# Patient Record
Sex: Female | Born: 1940
Health system: Southern US, Community
[De-identification: ages and names within clinical notes are randomized; demographics above are authoritative.]

## PROBLEM LIST (undated history)

## (undated) DIAGNOSIS — Z8719 Personal history of other diseases of the digestive system: Secondary | ICD-10-CM

## (undated) DIAGNOSIS — M1990 Unspecified osteoarthritis, unspecified site: Secondary | ICD-10-CM

## (undated) DIAGNOSIS — Z853 Personal history of malignant neoplasm of breast: Secondary | ICD-10-CM

## (undated) DIAGNOSIS — T8859XA Other complications of anesthesia, initial encounter: Secondary | ICD-10-CM

## (undated) DIAGNOSIS — J309 Allergic rhinitis, unspecified: Secondary | ICD-10-CM

## (undated) DIAGNOSIS — Z803 Family history of malignant neoplasm of breast: Secondary | ICD-10-CM

## (undated) DIAGNOSIS — IMO0002 Reserved for concepts with insufficient information to code with codable children: Secondary | ICD-10-CM

## (undated) DIAGNOSIS — G4730 Sleep apnea, unspecified: Secondary | ICD-10-CM

## (undated) DIAGNOSIS — T4145XA Adverse effect of unspecified anesthetic, initial encounter: Secondary | ICD-10-CM

## (undated) DIAGNOSIS — E039 Hypothyroidism, unspecified: Secondary | ICD-10-CM

## (undated) DIAGNOSIS — C50219 Malignant neoplasm of upper-inner quadrant of unspecified female breast: Secondary | ICD-10-CM

## (undated) DIAGNOSIS — IMO0001 Reserved for inherently not codable concepts without codable children: Secondary | ICD-10-CM

## (undated) DIAGNOSIS — Z973 Presence of spectacles and contact lenses: Secondary | ICD-10-CM

## (undated) HISTORY — DX: Unspecified osteoarthritis, unspecified site: M19.90

## (undated) HISTORY — DX: Family history of malignant neoplasm of breast: Z80.3

## (undated) HISTORY — PX: WISDOM TOOTH EXTRACTION: SHX21

## (undated) HISTORY — DX: Hypothyroidism, unspecified: E03.9

## (undated) HISTORY — DX: Sleep apnea, unspecified: G47.30

## (undated) HISTORY — PX: TONSILLECTOMY: SUR1361

## (undated) HISTORY — DX: Malignant neoplasm of upper-inner quadrant of unspecified female breast: C50.219

## (undated) HISTORY — DX: Personal history of malignant neoplasm of breast: Z85.3

## (undated) HISTORY — PX: EYE SURGERY: SHX253

## (undated) HISTORY — DX: Personal history of other diseases of the digestive system: Z87.19

## (undated) HISTORY — DX: Allergic rhinitis, unspecified: J30.9

---

## 1975-12-09 HISTORY — PX: ABDOMINAL HYSTERECTOMY: SHX81

## 1995-12-09 HISTORY — PX: MASTECTOMY: SHX3

## 2000-10-20 ENCOUNTER — Encounter (INDEPENDENT_AMBULATORY_CARE_PROVIDER_SITE_OTHER): Payer: Self-pay | Admitting: Specialist

## 2000-10-20 ENCOUNTER — Ambulatory Visit (HOSPITAL_BASED_OUTPATIENT_CLINIC_OR_DEPARTMENT_OTHER): Admission: RE | Admit: 2000-10-20 | Discharge: 2000-10-20 | Payer: Self-pay | Admitting: General Surgery

## 2002-11-16 ENCOUNTER — Other Ambulatory Visit: Admission: RE | Admit: 2002-11-16 | Discharge: 2002-11-16 | Payer: Self-pay | Admitting: Internal Medicine

## 2004-01-31 ENCOUNTER — Ambulatory Visit (HOSPITAL_COMMUNITY): Admission: RE | Admit: 2004-01-31 | Discharge: 2004-01-31 | Payer: Self-pay | Admitting: Hematology & Oncology

## 2004-11-13 ENCOUNTER — Ambulatory Visit: Payer: Self-pay | Admitting: Internal Medicine

## 2004-11-25 ENCOUNTER — Ambulatory Visit: Payer: Self-pay | Admitting: Internal Medicine

## 2004-11-26 ENCOUNTER — Ambulatory Visit (HOSPITAL_BASED_OUTPATIENT_CLINIC_OR_DEPARTMENT_OTHER): Admission: RE | Admit: 2004-11-26 | Discharge: 2004-11-26 | Payer: Self-pay | Admitting: Internal Medicine

## 2004-11-26 ENCOUNTER — Ambulatory Visit: Payer: Self-pay | Admitting: Pulmonary Disease

## 2004-12-08 HISTORY — PX: COLON SURGERY: SHX602

## 2004-12-23 ENCOUNTER — Ambulatory Visit: Payer: Self-pay | Admitting: Internal Medicine

## 2005-01-02 ENCOUNTER — Encounter: Admission: RE | Admit: 2005-01-02 | Discharge: 2005-01-02 | Payer: Self-pay | Admitting: Internal Medicine

## 2005-01-02 ENCOUNTER — Ambulatory Visit: Payer: Self-pay | Admitting: Internal Medicine

## 2005-01-21 ENCOUNTER — Ambulatory Visit: Payer: Self-pay | Admitting: Hematology & Oncology

## 2005-11-18 ENCOUNTER — Ambulatory Visit: Payer: Self-pay | Admitting: Internal Medicine

## 2005-11-25 ENCOUNTER — Ambulatory Visit: Payer: Self-pay | Admitting: Internal Medicine

## 2005-12-08 HISTORY — PX: OTHER SURGICAL HISTORY: SHX169

## 2005-12-08 HISTORY — PX: COLONOSCOPY: SHX174

## 2005-12-08 HISTORY — PX: HERNIA REPAIR: SHX51

## 2006-01-20 ENCOUNTER — Ambulatory Visit: Payer: Self-pay | Admitting: Hematology & Oncology

## 2006-01-20 ENCOUNTER — Ambulatory Visit: Payer: Self-pay | Admitting: Internal Medicine

## 2006-01-21 ENCOUNTER — Encounter: Admission: RE | Admit: 2006-01-21 | Discharge: 2006-01-21 | Payer: Self-pay | Admitting: Internal Medicine

## 2006-03-10 ENCOUNTER — Inpatient Hospital Stay (HOSPITAL_COMMUNITY): Admission: RE | Admit: 2006-03-10 | Discharge: 2006-03-16 | Payer: Self-pay | Admitting: General Surgery

## 2006-03-10 ENCOUNTER — Encounter (INDEPENDENT_AMBULATORY_CARE_PROVIDER_SITE_OTHER): Payer: Self-pay | Admitting: *Deleted

## 2006-11-25 ENCOUNTER — Ambulatory Visit (HOSPITAL_COMMUNITY): Admission: RE | Admit: 2006-11-25 | Discharge: 2006-11-26 | Payer: Self-pay | Admitting: Surgery

## 2007-01-27 ENCOUNTER — Ambulatory Visit: Payer: Self-pay | Admitting: Hematology & Oncology

## 2007-02-24 LAB — CBC WITH DIFFERENTIAL/PLATELET
BASO%: 0.4 % (ref 0.0–2.0)
EOS%: 2.5 % (ref 0.0–7.0)
Eosinophils Absolute: 0.2 10*3/uL (ref 0.0–0.5)
HCT: 39 % (ref 34.8–46.6)
HGB: 13.4 g/dL (ref 11.6–15.9)
LYMPH%: 21.6 % (ref 14.0–48.0)
MCV: 90 fL (ref 81.0–101.0)
MONO#: 0.5 10*3/uL (ref 0.1–0.9)
MONO%: 6.2 % (ref 0.0–13.0)
NEUT#: 5.1 10*3/uL (ref 1.5–6.5)
Platelets: 285 10*3/uL (ref 145–400)
RBC: 4.33 10*6/uL (ref 3.70–5.32)

## 2007-02-24 LAB — COMPREHENSIVE METABOLIC PANEL
ALT: 17 U/L (ref 0–35)
Albumin: 3.9 g/dL (ref 3.5–5.2)
BUN: 14 mg/dL (ref 6–23)
Calcium: 9.4 mg/dL (ref 8.4–10.5)
Sodium: 141 mEq/L (ref 135–145)
Total Protein: 7.1 g/dL (ref 6.0–8.3)

## 2007-02-24 LAB — CA 125: CA 125: 7.5 U/mL (ref 0.0–30.2)

## 2007-09-02 ENCOUNTER — Encounter: Payer: Self-pay | Admitting: Internal Medicine

## 2007-09-09 DIAGNOSIS — M1990 Unspecified osteoarthritis, unspecified site: Secondary | ICD-10-CM

## 2007-09-09 HISTORY — DX: Unspecified osteoarthritis, unspecified site: M19.90

## 2007-10-11 ENCOUNTER — Encounter: Payer: Self-pay | Admitting: Internal Medicine

## 2007-10-12 ENCOUNTER — Ambulatory Visit: Payer: Self-pay | Admitting: Internal Medicine

## 2007-10-12 DIAGNOSIS — J309 Allergic rhinitis, unspecified: Secondary | ICD-10-CM

## 2007-10-12 DIAGNOSIS — E039 Hypothyroidism, unspecified: Secondary | ICD-10-CM | POA: Insufficient documentation

## 2007-10-12 DIAGNOSIS — Z8719 Personal history of other diseases of the digestive system: Secondary | ICD-10-CM | POA: Insufficient documentation

## 2007-10-12 DIAGNOSIS — Z853 Personal history of malignant neoplasm of breast: Secondary | ICD-10-CM | POA: Insufficient documentation

## 2007-10-12 HISTORY — DX: Allergic rhinitis, unspecified: J30.9

## 2007-10-12 HISTORY — DX: Hypothyroidism, unspecified: E03.9

## 2007-10-12 HISTORY — DX: Personal history of other diseases of the digestive system: Z87.19

## 2007-10-12 HISTORY — DX: Personal history of malignant neoplasm of breast: Z85.3

## 2007-10-12 LAB — CONVERTED CEMR LAB
Cholesterol: 174 mg/dL (ref 0–200)
LDL Cholesterol: 112 mg/dL — ABNORMAL HIGH (ref 0–99)
Total CHOL/HDL Ratio: 3.7

## 2008-02-23 ENCOUNTER — Ambulatory Visit: Payer: Self-pay | Admitting: Hematology & Oncology

## 2008-04-03 ENCOUNTER — Encounter: Payer: Self-pay | Admitting: Internal Medicine

## 2008-04-03 LAB — CBC WITH DIFFERENTIAL/PLATELET
EOS%: 4.5 % (ref 0.0–7.0)
LYMPH%: 22.5 % (ref 14.0–48.0)
MCH: 31.1 pg (ref 26.0–34.0)
MCHC: 34.4 g/dL (ref 32.0–36.0)
MCV: 90.4 fL (ref 81.0–101.0)
NEUT%: 66.3 % (ref 39.6–76.8)
Platelets: 306 10*3/uL (ref 145–400)
RDW: 12.8 % (ref 11.3–14.5)
lymph#: 1.4 10*3/uL (ref 0.9–3.3)

## 2008-04-03 LAB — COMPREHENSIVE METABOLIC PANEL
ALT: 15 U/L (ref 0–35)
Albumin: 4 g/dL (ref 3.5–5.2)
BUN: 15 mg/dL (ref 6–23)
Chloride: 106 mEq/L (ref 96–112)
Creatinine, Ser: 0.62 mg/dL (ref 0.40–1.20)
Sodium: 140 mEq/L (ref 135–145)

## 2008-09-04 ENCOUNTER — Encounter: Payer: Self-pay | Admitting: Internal Medicine

## 2008-10-16 ENCOUNTER — Encounter: Payer: Self-pay | Admitting: Internal Medicine

## 2008-10-17 ENCOUNTER — Ambulatory Visit: Payer: Self-pay | Admitting: Internal Medicine

## 2008-10-17 LAB — CONVERTED CEMR LAB
ALT: 20 units/L (ref 0–35)
AST: 21 units/L (ref 0–37)
Alkaline Phosphatase: 60 units/L (ref 39–117)
Basophils Relative: 1 % (ref 0.0–3.0)
Bilirubin, Direct: 0.2 mg/dL (ref 0.0–0.3)
CO2: 27 meq/L (ref 19–32)
Calcium: 9.3 mg/dL (ref 8.4–10.5)
GFR calc Af Amer: 157 mL/min
HCT: 39.3 % (ref 36.0–46.0)
LDL Cholesterol: 108 mg/dL — ABNORMAL HIGH (ref 0–99)
Lymphocytes Relative: 27.2 % (ref 12.0–46.0)
MCHC: 34 g/dL (ref 30.0–36.0)
MCV: 93.9 fL (ref 78.0–100.0)
Sodium: 141 meq/L (ref 135–145)
Total CHOL/HDL Ratio: 4
Total Protein: 7.2 g/dL (ref 6.0–8.3)
Triglycerides: 124 mg/dL (ref 0–149)

## 2008-10-30 ENCOUNTER — Telehealth: Payer: Self-pay | Admitting: Internal Medicine

## 2009-09-13 ENCOUNTER — Telehealth: Payer: Self-pay | Admitting: Internal Medicine

## 2009-09-13 ENCOUNTER — Encounter: Payer: Self-pay | Admitting: Internal Medicine

## 2009-12-12 ENCOUNTER — Ambulatory Visit: Payer: Self-pay | Admitting: Internal Medicine

## 2009-12-17 ENCOUNTER — Encounter (INDEPENDENT_AMBULATORY_CARE_PROVIDER_SITE_OTHER): Payer: Self-pay | Admitting: *Deleted

## 2010-09-16 ENCOUNTER — Encounter: Payer: Self-pay | Admitting: Internal Medicine

## 2010-12-17 ENCOUNTER — Ambulatory Visit
Admission: RE | Admit: 2010-12-17 | Discharge: 2010-12-17 | Payer: Self-pay | Source: Home / Self Care | Attending: Internal Medicine | Admitting: Internal Medicine

## 2010-12-17 ENCOUNTER — Other Ambulatory Visit: Payer: Self-pay | Admitting: Internal Medicine

## 2010-12-17 ENCOUNTER — Encounter: Payer: Self-pay | Admitting: Internal Medicine

## 2010-12-17 LAB — CBC WITH DIFFERENTIAL/PLATELET
Basophils Absolute: 0 10*3/uL (ref 0.0–0.1)
Basophils Relative: 0.7 % (ref 0.0–3.0)
Eosinophils Absolute: 0.3 10*3/uL (ref 0.0–0.7)
Eosinophils Relative: 4.3 % (ref 0.0–5.0)
HCT: 41.5 % (ref 36.0–46.0)
Hemoglobin: 14.3 g/dL (ref 12.0–15.0)
Lymphocytes Relative: 25.5 % (ref 12.0–46.0)
Lymphs Abs: 1.8 10*3/uL (ref 0.7–4.0)
MCHC: 34.4 g/dL (ref 30.0–36.0)
MCV: 94.4 fl (ref 78.0–100.0)
Monocytes Absolute: 0.6 10*3/uL (ref 0.1–1.0)
Monocytes Relative: 9 % (ref 3.0–12.0)
Neutro Abs: 4.2 10*3/uL (ref 1.4–7.7)
Neutrophils Relative %: 60.5 % (ref 43.0–77.0)
Platelets: 247 10*3/uL (ref 150.0–400.0)
RBC: 4.4 Mil/uL (ref 3.87–5.11)
RDW: 13.3 % (ref 11.5–14.6)
WBC: 7 10*3/uL (ref 4.5–10.5)

## 2010-12-17 LAB — HEPATIC FUNCTION PANEL
ALT: 20 U/L (ref 0–35)
AST: 22 U/L (ref 0–37)
Albumin: 3.9 g/dL (ref 3.5–5.2)
Alkaline Phosphatase: 66 U/L (ref 39–117)
Bilirubin, Direct: 0.1 mg/dL (ref 0.0–0.3)
Total Bilirubin: 0.9 mg/dL (ref 0.3–1.2)
Total Protein: 7.1 g/dL (ref 6.0–8.3)

## 2010-12-17 LAB — LIPID PANEL
Cholesterol: 198 mg/dL (ref 0–200)
HDL: 58.3 mg/dL (ref >39.00)
LDL Cholesterol: 118 mg/dL — ABNORMAL HIGH (ref 0–99)
Total CHOL/HDL Ratio: 3
Triglycerides: 111 mg/dL (ref 0.0–149.0)
VLDL: 22.2 mg/dL (ref 0.0–40.0)

## 2010-12-17 LAB — BASIC METABOLIC PANEL
BUN: 15 mg/dL (ref 6–23)
CO2: 29 mEq/L (ref 19–32)
Calcium: 9.4 mg/dL (ref 8.4–10.5)
Chloride: 103 mEq/L (ref 96–112)
Creatinine, Ser: 0.6 mg/dL (ref 0.4–1.2)
GFR: 100.6 mL/min (ref >60.00)
Glucose, Bld: 83 mg/dL (ref 70–99)
Potassium: 4.4 mEq/L (ref 3.5–5.1)
Sodium: 141 mEq/L (ref 135–145)

## 2010-12-17 LAB — TSH: TSH: 1.01 u[IU]/mL (ref 0.35–5.50)

## 2010-12-18 ENCOUNTER — Telehealth: Payer: Self-pay | Admitting: Internal Medicine

## 2011-01-05 LAB — CONVERTED CEMR LAB
ALT: 22 units/L (ref 0–35)
AST: 22 units/L (ref 0–37)
Albumin: 3.7 g/dL (ref 3.5–5.2)
Basophils Absolute: 0 10*3/uL (ref 0.0–0.1)
Bilirubin, Direct: 0 mg/dL (ref 0.0–0.3)
CO2: 29 meq/L (ref 19–32)
Calcium: 9.4 mg/dL (ref 8.4–10.5)
Chloride: 107 meq/L (ref 96–112)
Cholesterol: 176 mg/dL (ref 0–200)
Eosinophils Absolute: 0.2 10*3/uL (ref 0.0–0.7)
Hemoglobin: 13.8 g/dL (ref 12.0–15.0)
LDL Cholesterol: 102 mg/dL — ABNORMAL HIGH (ref 0–99)
Lymphs Abs: 1.4 10*3/uL (ref 0.7–4.0)
MCHC: 32.6 g/dL (ref 30.0–36.0)
MCV: 96.8 fL (ref 78.0–100.0)
Platelets: 223 10*3/uL (ref 150.0–400.0)
Potassium: 4.4 meq/L (ref 3.5–5.1)
RDW: 12.8 % (ref 11.5–14.6)
Total Bilirubin: 0.8 mg/dL (ref 0.3–1.2)
Total CHOL/HDL Ratio: 3

## 2011-01-09 NOTE — Assessment & Plan Note (Signed)
Summary: CPX (PT WILL COME IN FASTING FOR APPT) // RS   Vital Signs:  Patient profile:   70 year old female Weight:      180 pounds BMI:     34.13 O2 Sat:      97 % on Room air Temp:     98.1 degrees F oral Pulse rate:   82 / minute BP sitting:   120 / 82  (right arm) Cuff size:   regular  Vitals Entered By: Duard Brady LPN (December 17, 2010 8:41 AM)  O2 Flow:  Room air CC: cpx- doing well Is Patient Diabetic? No   CC:  cpx- doing well.  History of Present Illness: 63 -year-old patient who is seen today for a comprehensive evaluation.  she has a remote history of left breast cancer, osteoarthritis, and also a history of complex diverticular disease.  For the past several weeks.  She has had left groin and left lower back pain.  This has been quite uncomfortable, but often interferes with sleep.  Pain is aggravated by bending and stooping.  She has cervical pain with the upright ambulation.  Here for Medicare AWV:  1.   Risk factors based on Past M, S, F history: financial risk factors none 2.   Physical Activities: remains fairly active without exercise limitations 3.   Depression/mood: history depression, or mood disorder 4.   Hearing: no hearing deficits 5.   ADL's: independent in all aspects of daily living 6.   Fall Risk: low 7.   Home Safety: no problems identified 8.   Height, weight, &visual acuity:height and weight stable;  planning on right Extraction surgery next month 9.   Counseling: heart healthy diet modest weight loss, and more regular exercise regimen.  All encouraged 10.   Labs ordered based on risk factors: laboratory  Profile will be reviewed 11.           Referral Coordination- none appropriate at this time.  Will need follow-up colonoscopy January 2016 12.           Care Plan- calcium and vitamin D supplementation.  Encouraged 13.            Cognitive Assessment- alert and oriented, with normal affect.  No cognitive dysfunction   Allergies  (verified): No Known Drug Allergies  Past History:  Past Medical History: Reviewed history from 10/17/2008 and no changes required. Osteoarthritis Breast cancer, hx of stage IIb 1997 Diverticulitis, hx of Allergic rhinitis obstructive sleep apnea  Past Surgical History: Reviewed history from 10/17/2008 and no changes required. Colonoscopy January 2006 Mastectomy 1997 Hysterectomy 1975 correction of a colovaginal fistula. April  2007 repair of a ventral hernia, December 2007  Family History: Reviewed history from 10/17/2008 and no changes required. Family History of DJD Family History of DVT Family History of CAD Female 1st degree relative <50 Family History Lung cancer Family History of Cardiovascular disorder Family History of Respiratory disease  father died age 60  lung cancer, history of COPD, coronary artery disease, status post CABG mother died age 47, history of COPD, peripheral neuropathy, deep vein thrombosis  No siblings  Social History: Reviewed history from 09/09/2007 and no changes required. Retired Married Never Smoked  Review of Systems       The patient complains of anorexia.  The patient denies fever, weight loss, weight gain, vision loss, decreased hearing, hoarseness, chest pain, syncope, dyspnea on exertion, peripheral edema, prolonged cough, headaches, hemoptysis, abdominal pain, melena, hematochezia, severe indigestion/heartburn, hematuria, incontinence,  genital sores, muscle weakness, suspicious skin lesions, transient blindness, difficulty walking, depression, unusual weight change, abnormal bleeding, enlarged lymph nodes, angioedema, and breast masses.    Physical Exam  General:  overweight-appearing.  blood pressure, normaloverweight-appearing.   Head:  Normocephalic and atraumatic without obvious abnormalities. No apparent alopecia or balding. Eyes:  No corneal or conjunctival inflammation noted. EOMI. Perrla. Funduscopic exam benign, without  hemorrhages, exudates or papilledema. Vision grossly normal. Ears:  External ear exam shows no significant lesions or deformities.  Otoscopic examination reveals clear canals, tympanic membranes are intact bilaterally without bulging, retraction, inflammation or discharge. Hearing is grossly normal bilaterally. Nose:  External nasal examination shows no deformity or inflammation. Nasal mucosa are pink and moist without lesions or exudates. Mouth:  Oral mucosa and oropharynx without lesions or exudates.  Teeth in good repair. Neck:  No deformities, masses, or tenderness noted. Chest Wall:  No deformities, masses, or tenderness noted. Breasts:  left radical mastectomy Lungs:  Normal respiratory effort, chest expands symmetrically. Lungs are clear to auscultation, no crackles or wheezes. Heart:  Normal rate and regular rhythm. S1 and S2 normal without gallop, murmur, click, rub or other extra sounds. Abdomen:  lower midline scar.  No organomegaly, moderately overweight.  No tenderness Rectal:  No external abnormalities noted. Normal sphincter tone. No rectal masses or tenderness. Genitalia:  status post hysterectomy Msk:  No deformity or scoliosis noted of thoracic or lumbar spine.   negative straight leg test range of motion of the left hip intact Pulses:  R and L carotid,radial,femoral,dorsalis pedis and posterior tibial pulses are full and equal bilaterally Extremities:  No clubbing, cyanosis, edema, or deformity noted with normal full range of motion of all joints.   Neurologic:  No cranial nerve deficits noted. Station and gait are normal. Plantar reflexes are down-going bilaterally. DTRs are symmetrical throughout. Sensory, motor and coordinative functions appear intact. Skin:  Intact without suspicious lesions or rashes Cervical Nodes:  No lymphadenopathy noted Axillary Nodes:  No palpable lymphadenopathy Inguinal Nodes:  No significant adenopathy Psych:  Cognition and judgment appear  intact. Alert and cooperative with normal attention span and concentration. No apparent delusions, illusions, hallucinations   Impression & Recommendations:  Problem # 1:  PREVENTIVE HEALTH CARE (ICD-V70.0)  Orders: EKG w/ Interpretation (93000)  Problem # 2:  HYPOTHYROIDISM, PRIMARY (ICD-244.9)  Her updated medication list for this problem includes:    Synthroid 125 Mcg Tabs (Levothyroxine sodium) .Marland Kitchen... 1 once daily    Her updated medication list for this problem includes:    Synthroid 125 Mcg Tabs (Levothyroxine sodium) .Marland Kitchen... 1 once daily  Problem # 3:  DIVERTICULITIS, HX OF (ICD-V12.79)  Problem # 4:  BREAST CANCER, HX OF (ICD-V10.3)  Complete Medication List: 1)  Adult Aspirin Low Strength 81 Mg Tbdp (Aspirin) .Marland Kitchen.. 1 once daily 2)  Synthroid 125 Mcg Tabs (Levothyroxine sodium) .Marland Kitchen.. 1 once daily 3)  Flonase 50 Mcg/act Susp (Fluticasone propionate) .... Use daily prn  Other Orders: Medicare -1st Annual Wellness Visit [phone]) Venipuncture [phone]) TLB-Lipid Panel (80061-LIPID) TLB-BMP (Basic Metabolic Panel-BMET) (80048-METABOL) TLB-CBC Platelet - w/Differential (85025-CBCD) TLB-Hepatic/Liver Function Pnl (80076-HEPATIC) TLB-TSH (Thyroid Stimulating Hormone) (84443-TSH)  Patient Instructions: 1)  Limit your Sodium (Salt). 2)  It is important that you exercise regularly at least 20 minutes 5 times a week. If you develop chest pain, have severe difficulty breathing, or feel very tired , stop exercising immediately and seek medical attention. 3)  You need to lose weight. Consider a lower calorie diet and regular  exercise.  4)  Take calcium +Vitamin D daily. Prescriptions: FLONASE 50 MCG/ACT  SUSP (FLUTICASONE PROPIONATE) use daily prn  #3 x 6   Entered and Authorized by:   Gordy Savers  MD   Signed by:   Gordy Savers  MD on 12/17/2010   Method used:   Print then Give to Patient   RxID:   [id number] SYNTHROID 125 MCG  TABS (LEVOTHYROXINE SODIUM) 1 once  daily  #90 x 6   Entered and Authorized by:   Gordy Savers  MD   Signed by:   Gordy Savers  MD on 12/17/2010   Method used:   Print then Give to Patient   RxID:   [id number]    Orders Added: 1)  EKG w/ Interpretation [93000] 2)  Medicare -1st Annual Wellness Visit [G0438] 3)  Est. Patient Level III [28413] 4)  Venipuncture [36415] 5)  TLB-Lipid Panel [80061-LIPID] 6)  TLB-BMP (Basic Metabolic Panel-BMET) [80048-METABOL] 7)  TLB-CBC Platelet - w/Differential [85025-CBCD] 8)  TLB-Hepatic/Liver Function Pnl [80076-HEPATIC] 9)  TLB-TSH (Thyroid Stimulating Hormone) [24401-UUV]

## 2011-01-09 NOTE — Assessment & Plan Note (Signed)
Summary: emp-will fast//ccm   Vital Signs:  Patient profile:   70 year old female Height:      61 inches Weight:      178 pounds BMI:     33.75 BP sitting:   128 / 72  (right arm) Cuff size:   regular  Vitals Entered By: Raechel Ache, RN (December 12, 2009 9:38 AM) CC: OV, fasting.  Is Patient Diabetic? No   CC:  OV and fasting. Marland Kitchen  History of Present Illness: 28 -year-old patient who is seen today for a comprehensive evaluation.  medical problems, including history of complex diverticular disease.  She is allergic rhinitis.  Remote history of breast cancer and also history of osteoarthritis.  She has treated hypothyroidism.  She is doing quite well.  Today, and is now status post mastectomy 10 years ago.  Allergies: No Known Drug Allergies  Past History:  Past Medical History: Reviewed history from 10/17/2008 and no changes required. Osteoarthritis Breast cancer, hx of stage IIb 1997 Diverticulitis, hx of Allergic rhinitis obstructive sleep apnea  Past Surgical History: Reviewed history from 10/17/2008 and no changes required. Colonoscopy January 2006 Mastectomy 1997 Hysterectomy 1975 correction of a colovaginal fistula. April  2007 repair of a ventral hernia, December 2007  Family History: Reviewed history from 10/17/2008 and no changes required. Family History of DJD Family History of DVT Family History of CAD Female 1st degree relative <50 Family History Lung cancer Family History of Cardiovascular disorder Family History of Respiratory disease  father died age 33 lung cancer, history of COPD, coronary artery disease, status post CABG mother died age 70, history of COPD, peripheral neuropathy, deep vein thrombosis  No siblings  Social History: Reviewed history from 09/09/2007 and no changes required. Retired Married Never Smoked  Review of Systems  The patient denies anorexia, fever, weight loss, weight gain, vision loss, decreased hearing,  hoarseness, chest pain, syncope, dyspnea on exertion, peripheral edema, prolonged cough, headaches, hemoptysis, abdominal pain, melena, hematochezia, severe indigestion/heartburn, hematuria, incontinence, genital sores, muscle weakness, suspicious skin lesions, transient blindness, difficulty walking, depression, unusual weight change, abnormal bleeding, enlarged lymph nodes, angioedema, and breast masses.    Physical Exam  General:  overweight-appearing.  normal blood pressureoverweight-appearing.   Head:  Normocephalic and atraumatic without obvious abnormalities. No apparent alopecia or balding. Eyes:  No corneal or conjunctival inflammation noted. EOMI. Perrla. Funduscopic exam benign, without hemorrhages, exudates or papilledema. Vision grossly normal. Ears:  External ear exam shows no significant lesions or deformities.  Otoscopic examination reveals clear canals, tympanic membranes are intact bilaterally without bulging, retraction, inflammation or discharge. Hearing is grossly normal bilaterally. Nose:  External nasal examination shows no deformity or inflammation. Nasal mucosa are pink and moist without lesions or exudates. Mouth:  Oral mucosa and oropharynx without lesions or exudates.  Teeth in good repair. Neck:  No deformities, masses, or tenderness noted. Chest Wall:  No deformities, masses, or tenderness noted. Breasts:  left mastectomy Lungs:  Normal respiratory effort, chest expands symmetrically. Lungs are clear to auscultation, no crackles or wheezes. Heart:  Normal rate and regular rhythm. S1 and S2 normal without gallop, murmur, click, rub or other extra sounds. Abdomen:  Bowel sounds positive,abdomen soft and non-tender without masses, organomegaly or hernias noted. Rectal:  No external abnormalities noted. Normal sphincter tone. No rectal masses or tenderness. Genitalia:  status post hysterectomy Msk:  No deformity or scoliosis noted of thoracic or lumbar spine.   Pulses:  R  and L carotid,radial,femoral,dorsalis pedis and  posterior tibial pulses are full and equal bilaterally Extremities:  No clubbing, cyanosis, edema, or deformity noted with normal full range of motion of all joints.   Neurologic:  alert & oriented X3, cranial nerves II-XII intact, strength normal in all extremities, gait normal, and DTRs symmetrical and normal.  alert & oriented X3, cranial nerves II-XII intact, strength normal in all extremities, gait normal, and DTRs symmetrical and normal.   Skin:  Intact without suspicious lesions or rashes Cervical Nodes:  No lymphadenopathy noted Axillary Nodes:  No palpable lymphadenopathy Inguinal Nodes:  No significant adenopathy Psych:  Cognition and judgment appear intact. Alert and cooperative with normal attention span and concentration. No apparent delusions, illusions, hallucinations   Impression & Recommendations:  Problem # 1:  HYPOTHYROIDISM, PRIMARY (ICD-244.9)  Her updated medication list for this problem includes:    Synthroid 125 Mcg Tabs (Levothyroxine sodium) .Marland Kitchen... 1 once daily  Her updated medication list for this problem includes:    Synthroid 125 Mcg Tabs (Levothyroxine sodium) .Marland Kitchen... 1 once daily  Orders: Venipuncture (28413) TLB-Lipid Panel (80061-LIPID) TLB-BMP (Basic Metabolic Panel-BMET) (80048-METABOL) TLB-CBC Platelet - w/Differential (85025-CBCD) TLB-Hepatic/Liver Function Pnl (80076-HEPATIC) TLB-TSH (Thyroid Stimulating Hormone) (84443-TSH)  Problem # 2:  ALLERGIC RHINITIS (ICD-477.9)  Her updated medication list for this problem includes:    Flonase 50 Mcg/act Susp (Fluticasone propionate) ..... Use daily prn  Her updated medication list for this problem includes:    Flonase 50 Mcg/act Susp (Fluticasone propionate) ..... Use daily prn  Orders: TLB-BMP (Basic Metabolic Panel-BMET) (80048-METABOL) TLB-CBC Platelet - w/Differential (85025-CBCD) TLB-Hepatic/Liver Function Pnl (80076-HEPATIC)  Problem # 3:   DIVERTICULITIS, HX OF (ICD-V12.79)  Orders: TLB-BMP (Basic Metabolic Panel-BMET) (80048-METABOL) TLB-CBC Platelet - w/Differential (85025-CBCD) TLB-Hepatic/Liver Function Pnl (80076-HEPATIC)  Problem # 4:  BREAST CANCER, HX OF (ICD-V10.3)  Orders: TLB-BMP (Basic Metabolic Panel-BMET) (80048-METABOL) TLB-CBC Platelet - w/Differential (85025-CBCD) TLB-Hepatic/Liver Function Pnl (80076-HEPATIC)  Complete Medication List: 1)  Adult Aspirin Low Strength 81 Mg Tbdp (Aspirin) .Marland Kitchen.. 1 once daily 2)  Synthroid 125 Mcg Tabs (Levothyroxine sodium) .Marland Kitchen.. 1 once daily 3)  Flonase 50 Mcg/act Susp (Fluticasone propionate) .... Use daily prn  Other Orders: EKG w/ Interpretation (93000) Pelvic & Breast Exam ( Medicare)  (K4401)  Patient Instructions: 1)  Please schedule a follow-up appointment in 1 year. 2)  It is important that you exercise regularly at least 20 minutes 5 times a week. If you develop chest pain, have severe difficulty breathing, or feel very tired , stop exercising immediately and seek medical attention. 3)  Schedule your mammogram. 4)  Take calcium +Vitamin D daily. Prescriptions: FLONASE 50 MCG/ACT  SUSP (FLUTICASONE PROPIONATE) use daily prn  #3 x 6   Entered and Authorized by:   Gordy Savers  MD   Signed by:   Gordy Savers  MD on 12/12/2009   Method used:   Print then Give to Patient   RxID:   [id number] SYNTHROID 125 MCG  TABS (LEVOTHYROXINE SODIUM) 1 once daily  #90 x 6   Entered and Authorized by:   Gordy Savers  MD   Signed by:   Gordy Savers  MD on 12/12/2009   Method used:   Print then Give to Patient   RxID:   [id number]

## 2011-01-09 NOTE — Letter (Signed)
Summary: Colonoscopy Date Change Letter  [facility]  [address] [place], [place] [address]   Phone: [phone]  Fax: [phone]      December 17, 2009 MRN: [id number]   [facility] [address] [place], [place]  [address]   Dear Ms. Faustino Congress,   Previously you were recommended to have a repeat colonoscopy around this time. Your chart was recently reviewed by Dr. Marina Goodell of [facility]. Follow up colonoscopy is now recommended in JAN 2016. This revised recommendation is based on current, nationally recognized guidelines for colorectal cancer screening and polyp surveillance. These guidelines are endorsed by the American Cancer Society, [employer] on Colorectal Cancer as well as numerous other major medical organizations.  Please understand that our recommendation assumes that you do not have any new symptoms such as bleeding, a change in bowel habits, anemia, or significant abdominal discomfort. If you do have any concerning GI symptoms or want to discuss the guideline recommendations, please call to arrange an office visit at your earliest convenience. Otherwise we will keep you in our reminder system and contact you 1-2 months prior to the date listed above to schedule your next colonoscopy.  Thank you,  Wilhemina Bonito. Marina Goodell, M.D.  [facility] [phone]

## 2011-01-09 NOTE — Progress Notes (Signed)
Summary: Question about Synthroid RX  Phone Note Call from Patient Call back at Home Phone [phone]   Caller: Patient Summary of Call: Pt waiting on a response from Dr. Kirtland Bouchard in regards to if she needed another rx for Synthroid. Initial call taken by: Trixie Dredge,  December 18, 2010 11:39 AM  Follow-up for Phone Call        continue present dose of synthroid (TSH normal) Follow-up by: Gordy Savers  MD,  December 18, 2010 1:02 PM  Additional Follow-up for Phone Call Additional follow up Details #1::        pt aware lab ok - continue present dose of med. KIK Additional Follow-up by: Duard Brady LPN,  December 18, 2010 1:09 PM

## 2011-01-28 ENCOUNTER — Encounter: Payer: Self-pay | Admitting: Internal Medicine

## 2011-01-28 ENCOUNTER — Ambulatory Visit (INDEPENDENT_AMBULATORY_CARE_PROVIDER_SITE_OTHER): Payer: Medicare Other | Admitting: Internal Medicine

## 2011-01-28 DIAGNOSIS — M545 Low back pain: Secondary | ICD-10-CM

## 2011-01-28 DIAGNOSIS — M1990 Unspecified osteoarthritis, unspecified site: Secondary | ICD-10-CM

## 2011-01-28 DIAGNOSIS — Z853 Personal history of malignant neoplasm of breast: Secondary | ICD-10-CM

## 2011-01-29 ENCOUNTER — Encounter: Payer: Self-pay | Admitting: Internal Medicine

## 2011-01-29 NOTE — Patient Instructions (Signed)
Lumbar MRI as scheduled  Call or return to clinic prn if these symptoms worsen or fail to improve as anticipated.  

## 2011-01-29 NOTE — Progress Notes (Signed)
  Subjective:    Patient ID: Shawna Marquez, female    DOB: November 23, 1941, 70 y.o.   MRN: [id number]  HPI   70 year old patient he was seen today for evaluation of back pain. She was seen the last month for a physical and had minimal discomfort at that time. She has had worsening left hip as well as low back pain pain radiates to the left mid leg area. The pain has become much more bothersome at night and also with ambulation through the day when she was seen last month that the med her injection was given with only 2-3 days of benefit. She has been using analgesics. She does have a remote history of breast cancer. She denies any constitutional symptoms other than a general sense of unwellness denies any fever or chills or anorexia. She does have a history of osteoarthritis. Other medical problems included treated hypothyroidism.    Review of Systems  Constitutional: Negative.   HENT: Negative for hearing loss, congestion, sore throat, rhinorrhea, dental problem, sinus pressure and tinnitus.   Eyes: Negative for pain, discharge and visual disturbance.  Respiratory: Negative for cough and shortness of breath.   Cardiovascular: Negative for chest pain, palpitations and leg swelling.  Gastrointestinal: Negative for nausea, vomiting, abdominal pain, diarrhea, constipation, blood in stool and abdominal distention.  Genitourinary: Negative for dysuria, urgency, frequency, hematuria, flank pain, vaginal bleeding, vaginal discharge, difficulty urinating, vaginal pain and pelvic pain.  Musculoskeletal: Positive for back pain. Negative for joint swelling, arthralgias and gait problem.  Skin: Negative for rash.  Neurological: Negative for dizziness, syncope, speech difficulty, weakness, numbness and headaches.  Hematological: Negative for adenopathy.  Psychiatric/Behavioral: Negative for behavioral problems, dysphoric mood and agitation. The patient is not nervous/anxious.        Objective:   Physical Exam    Constitutional: She is oriented to person, place, and time. She appears well-developed and well-nourished. No distress.  HENT:  Head: Normocephalic.  Right Ear: External ear normal.  Left Ear: External ear normal.  Mouth/Throat: Oropharynx is clear and moist.  Eyes: Conjunctivae and EOM are normal. Pupils are equal, round, and reactive to light.  Neck: Normal range of motion. Neck supple. No thyromegaly present.  Cardiovascular: Normal rate, regular rhythm, normal heart sounds and intact distal pulses.   Pulmonary/Chest: Effort normal and breath sounds normal.  Abdominal: Soft. Bowel sounds are normal. She exhibits no mass. There is no tenderness.  Musculoskeletal: Normal range of motion. She exhibits no edema and no tenderness.        Straight leg test was negative she had no local tenderness over the lumbar spine or hip region.  Lymphadenopathy:    She has no cervical adenopathy.  Neurological: She is alert and oriented to person, place, and time.  Skin: Skin is warm and dry. No rash noted.  Psychiatric: She has a normal mood and affect. Her behavior is normal.           Assessment & Plan:   progressive low back and left hip pain in a patient with prior history of breast cancer. We'll set up for a lumbar MRI to further evaluate.  hypothyroidism stable  Osteoarthritis

## 2011-02-01 ENCOUNTER — Ambulatory Visit
Admission: RE | Admit: 2011-02-01 | Discharge: 2011-02-01 | Disposition: A | Discharge status: Home / Self Care | Payer: Medicare Other | Source: Ambulatory Visit | Attending: Internal Medicine | Admitting: Internal Medicine

## 2011-02-01 DIAGNOSIS — M545 Low back pain: Secondary | ICD-10-CM

## 2011-02-04 ENCOUNTER — Other Ambulatory Visit: Payer: Self-pay | Admitting: Internal Medicine

## 2011-02-04 ENCOUNTER — Telehealth: Payer: Self-pay | Admitting: Internal Medicine

## 2011-02-04 MED ORDER — ZOLPIDEM TARTRATE 10 MG PO TABS: 10.0000 mg | ORAL_TABLET | Freq: Every evening | ORAL | Status: AC | PRN

## 2011-02-04 NOTE — Progress Notes (Signed)
Quick Note:  Spoke with pt - doing ok , still with pain, would like something to help sleep at night - [facility] [place]. ______

## 2011-02-04 NOTE — Progress Notes (Signed)
Quick Note:  Change to med list [facility] . kik ______

## 2011-02-06 ENCOUNTER — Ambulatory Visit (INDEPENDENT_AMBULATORY_CARE_PROVIDER_SITE_OTHER): Payer: Medicare Other | Admitting: Internal Medicine

## 2011-02-06 ENCOUNTER — Encounter: Payer: Self-pay | Admitting: Internal Medicine

## 2011-02-06 DIAGNOSIS — M549 Dorsalgia, unspecified: Secondary | ICD-10-CM

## 2011-02-06 DIAGNOSIS — M1990 Unspecified osteoarthritis, unspecified site: Secondary | ICD-10-CM

## 2011-02-06 NOTE — Progress Notes (Signed)
  Subjective:    Patient ID: Shawna Marquez, female    DOB: 1941/05/11, 70 y.o.   MRN: [id number]  HPI  70 year old patient who is seen today for followup. She has had some significant low back pain with radiation down the left leg. The 2 refractory symptoms lumbar MRI was obtained recently. The patient is moderately improved. MRI results were discussed at length. She denies any leg weakness but does state that her gait is somewhat altered do to the discomfort. She feels that she is improving daily.    Review of Systems  Constitutional: Negative.   HENT: Negative for hearing loss, congestion, sore throat, rhinorrhea, dental problem, sinus pressure and tinnitus.   Eyes: Negative for pain, discharge and visual disturbance.  Respiratory: Negative for cough and shortness of breath.   Cardiovascular: Negative for chest pain, palpitations and leg swelling.  Gastrointestinal: Negative for nausea, vomiting, abdominal pain, diarrhea, constipation, blood in stool and abdominal distention.  Genitourinary: Negative for dysuria, urgency, frequency, hematuria, flank pain, vaginal bleeding, vaginal discharge, difficulty urinating, vaginal pain and pelvic pain.  Musculoskeletal: Positive for back pain. Negative for joint swelling, arthralgias and gait problem.  Skin: Negative for rash.  Neurological: Negative for dizziness, syncope, speech difficulty, weakness, numbness and headaches.  Hematological: Negative for adenopathy.  Psychiatric/Behavioral: Negative for behavioral problems, dysphoric mood and agitation. The patient is not nervous/anxious.        Objective:   Physical Exam  Constitutional: She appears well-developed and well-nourished. No distress.  Musculoskeletal: Normal range of motion. She exhibits no edema and no tenderness.          Assessment & Plan:   advanced osteoarthritis of the lumbar spine. Improved. We'll continue present regimen;  we'll slowly increase her level of activity

## 2011-02-06 NOTE — Patient Instructions (Signed)
Most patients with low back pain will improve with time over the next two to 6 weeks.  Keep active but avoid any activities that cause pain.  Apply moist heat to the low back area several times daily.  Call or return to clinic prn if these symptoms worsen or fail to improve as anticipated.  

## 2011-04-15 ENCOUNTER — Other Ambulatory Visit: Payer: Self-pay

## 2011-04-15 MED ORDER — LEVOTHYROXINE SODIUM 125 MCG PO TABS: 125.0000 ug | ORAL_TABLET | Freq: Every day | ORAL | Status: DC

## 2011-04-15 MED ORDER — FLUTICASONE PROPIONATE 50 MCG/ACT NA SUSP: 1.0000 | Freq: Every day | NASAL | Status: DC | PRN

## 2011-04-17 ENCOUNTER — Other Ambulatory Visit: Payer: Self-pay

## 2011-04-17 MED ORDER — FLUTICASONE PROPIONATE 50 MCG/ACT NA SUSP: 1.0000 | Freq: Every day | NASAL | Status: DC | PRN

## 2011-04-25 NOTE — Op Note (Signed)
Shawna Marquez, Shawna Marquez                  ACCOUNT NO.:  [id number]   MEDICAL RECORD NO.:  [id number]          PATIENT TYPE:  INP   LOCATION:  X001                         FACILITY:  [facility]   PHYSICIAN:  Gita Kudo, M.D. DATE OF BIRTH:  July 18, 1941   DATE OF PROCEDURE:  03/10/2006  DATE OF DISCHARGE:                                 OPERATIVE REPORT   OPERATIVE PROCEDURE:  Exploratory laparotomy, resection of rectosigmoid and  repair of colovaginal fistula.   ANESTHESIA:  General endotracheal.   SURGEON:  Gita Kudo, M.D.   ASSISTANT:  Alfonse Ras, M.D.   PREOPERATIVE DIAGNOSIS:  Colovaginal fistula secondary to  diverticulosis/diverticulitis.   POSTOPERATIVE DIAGNOSIS:  Colovaginal fistula secondary to  diverticulosis/diverticulitis - pending pathology.   CLINICAL SUMMARY:  70 year old female brought in for elective resection of  an area of diverticulitis with colovaginal fistula.  She has had workup  including CT and BE as well as colonoscopy.   OPERATIVE FINDINGS:  There was an intense inflammatory reaction in the  pelvis primarily on the left side involving the rectosigmoid and it was  quite adherent to the vagina.  The ureter was intimately involved but we  were very careful and avoided injuring it.  The disease looked benign and  was sent for permanent section.   OPERATIVE PROCEDURE:  Under satisfactory general endotracheal anesthesia,  the patient was positioned, prepped and draped in standard fashion.  She  received 1 grams Cefotan preop.  She was placed in the yellow fin stirrups  and had a vaginal prep with Betadine by myself.  I then left a Foley  catheter in the vagina as well as one in the bladder and packed the vagina  with a gauze so that I could feel it during the procedure.  Following this, a midline incision was made into the peritoneum.  Bleeders  were controlled by cautery or ties.  General laparotomy performed with the  findings of a normal  stomach, liver, small bowel and large bowel except for  this area in the rectosigmoid which was intensely inflamed.  There was no  gross pus.  She wished that her ovaries be removed but they were not readily  evident, she has had previous hysterectomy and I did not remove them.  Self-  retaining retractors were placed and with good exposure, the lateral  reflections of the colon were taken down to the pelvis.  The colon was  retracted medially and I identified the left ureter which was intimately  associated with the mass but we stayed away from it.  Then, the medial  mesentery was incised with cautery and the right ureter identified and not  injured.  I thought it would be best to transect the colon proximally and  did so with a GIA stapling device.  Then, using the distal portion as a  handle, we divided the mesentery close to the colon between clamps and ties  of 2-0 silk.  When we got down toward the vagina, I could feel the sponges  and we had the nurses instill peroxide into the  Foley catheter but none came  through into the abdominal cavity.  At that point, Dr. Luan Pulling went below  and manually put her hand in the patient's vagina, removed the Foley and  packing, and then I was able to see that the vagina was dissected away and  anterior from the inflammatory mass.  There was no gross hole in the vagina.   At this point, we had the anesthetist give indigo carmine and the urine  turned blue, but there was no evidence of any leakage so I felt that we were  pretty safe with the ureter.  The dissection was continued down to healthy  soft bowel distally and then this was transected with the curved TA-55.  At  this point, the area was checked for hemostasis which was good.  Then, it  was decided to do an EEA stapled anastomosis.  Size 29 was selected.  A hole  was made in the side of the proximal transected sigmoid and the anvil placed  after a pursestring of 2-0 Prolene was done.  When  tied, it appeared to be  in good position and I did a second pursestring of 2-0 Prolene.  Dr. Colin Benton  went below and placed the EEA stapling device and we completed the stapled  anastomosis.  The two donuts of colon were inspected and felt to be intact.  Then, the rectum was filled with air and there was no evidence of any  leakage in the pelvis which had been filled with saline.  The saline was  then suctioned away.  Hemostasis was good.  Tisseel was applied to the  anastomosis.  Then, the sponge and needle counts were correct.  Gloves were  changed.  The abdomen was closed with a running double-stranded #1 PDS  suture.  Staples were used to approximate the skin.  Sterile absorbent  dressings were applied and she went to the recovery room from the operating  room good condition.           ______________________________  Gita Kudo, M.D.     MRL/MEDQ  D:  03/10/2006  T:  03/10/2006  Job:  [id number]   cc:   Gordy Savers, M.D. [facility]  [address] [place]  [place] [address]   Wilhemina Bonito. Marina Goodell, M.D. [facility]  [address]  [place]  [place] [address]

## 2011-04-25 NOTE — Discharge Summary (Signed)
[name], Shawna Marquez                  ACCOUNT NO.:  [id number]   MEDICAL RECORD NO.:  [id number]          PATIENT TYPE:  INP   LOCATION:  1614                         [facility]ACILITY:  [facility]   PHYSICIAN:  Gita Kudo, M.D. DATE OF BIRTH:  1941-03-19   DATE OF ADMISSION:  03/10/2006  DATE OF DISCHARGE:  03/16/2006                                 DISCHARGE SUMMARY   CHIEF COMPLAINT:  Diverticulitis with colovaginal fistula.   HISTORY OF PRESENT ILLNESS:  [name] is a 70 year old lady with several month  history of fecal and gas passing through the vagina. Her studies show  diverticular disease, and colonoscopy was unable to be performed because of  partial obstruction and curvature. A barium enema confirmed the diagnosis,  and she is brought in for elective surgery.   LABORATORY STUDIES:  Pathology, severe diverticulitis. There was rupture and  serositis but no evidence of any malignancy. Hemoglobin 13, hematocrit 38,  white count 6700. CMET totally normal. Urinalysis was negative. EKG was  within normal limits.   HOSPITAL COURSE:  The patient had an excellent outpatient bowel prep, and on  April 3 underwent a resection of severe sigmoid diverticulitis with a  colovaginal fistula. She did undergo primary anastomosis. Postoperatively,  she did well. She was comfortable and maintained on PCA. Catheters and  nasogastric tube removed on schedule, and she slowly improved until she  regained bowel function, was voiding, ambulatory and comfortable.  Accordingly, on her sixth postoperative day, she was allowed home. She was  tolerating a diet and having BMs. She had no complications.   DISCHARGE DIAGNOSIS:  1.  Diverticulitis, colon.  2.  History of breast cancer.   OPERATIONS:  Sigmoid resection with primary anastomosis.   CONSULTATIONS COMPLICATIONS INFECTIONS:  None.   CONDITION ON DISCHARGE:  Good.           ______________________________  Gita Kudo, M.D.     MRL/MEDQ  D:   03/31/2006  T:  03/31/2006  Job:  [id number]   cc:   Rose Phi. Myna Hidalgo, M.D.  Fax: [phone]   Wilhemina Bonito. Marina Goodell, M.D. [facility]  [address]  [place]  [place] [address]   Gordy Savers, M.D. [facility]  [address] [place]  [place] [address]

## 2011-04-25 NOTE — Op Note (Signed)
Shawna Marquez, Shawna Marquez                  ACCOUNT NO.:  [id number]   MEDICAL RECORD NO.:  [id number]          PATIENT TYPE:  OIB   LOCATION:  1619                         FACILITY:  [facility]   PHYSICIAN:  Shawna Sportsman, MD     DATE OF BIRTH:  05-Apr-1941   DATE OF PROCEDURE:  11/25/2006  DATE OF DISCHARGE:  11/26/2006                               OPERATIVE REPORT   PRIMARY CARE PHYSICIAN:  Shawna Marquez, M.D.   SURGEON:  Shawna Marquez, M.D.   ASSISTANT:  Shawna Marquez, M.D   OTHER SURGEON:  Shawna Marquez, M.D.   PREOPERATIVE DIAGNOSES:  Ventral wall incisional hernia.   POSTOPERATIVE DIAGNOSES:  A 10 cm x 18 cm ventral wall incisional  hernia.   PROCEDURE:  1. Laparoscopic lysis of adhesions x60 minutes.  2. Laparoscopic ventral hernia repair (Priotex 20 cm x 25 cm dual-      sided mesh).   ANESTHESIA:  1. General anesthesia.  2. Bupivacaine 0.25% field block around all port and fascial stitches.   SPECIMENS:  None.   DRAINS:  None.   ESTIMATED BLOOD LOSS:  Minimal.   COMPLICATIONS:  None apparent.   INDICATIONS FOR PROCEDURE:  Shawna Marquez is a 70 year old female who  developed a colovaginal fistula that was repaired by Dr. Maryagnes Marquez.  Unfortunately she developed a postoperative incisional hernia that had  become increasing painful and uncomfortable.  The pathology and  physiology of ventral herniation was explained.  The recommendations  made for a laparoscopic lysis of adhesions with a ventral wall hernia  repair.  The risks such as stroke, heart attack, deep venous thrombosis,  pulmonary embolism and death were discussed.  The risks such as  bleeding, need for a transfusion, wound infection, abscess, injury to  other organs, small bowel or colon injury, resulting in an intra-  cutaneous fistula or a mesh infection, hernia recurrence and other risks  were discussed.  Questions were answered and she agreed to proceed.   OPERATIVE FINDINGS:  She had two hernia  defects, totaling 18 cm x 10 cm  in size along her midline incision, primarily infraumbilical but some  supraumbilical component as well.   DESCRIPTION OF PROCEDURE:  An informed consent was confirmed.  The  patient received her preoperative IV antibiotics.  She underwent general  anesthesia without difficulty.  She had a Foley catheter placed for the  overall case.  Entry was gained in the abdomen and left upper quadrant  using an optical view technique.  Capnoperitoneum to 15 mmHg for good  abdominal insufflation.  Camera inspection revealed no intra-abdominal  injury.  Under direct visualization a 5 mm port was placed in the left  mid-abdomen and in the left lower quadrant as well.  Dense omental as  well as small bowel adhesions could be seen on the most of the anterior  abdominal wall.  These were taken down using primarily sharp as well as  controlled blunt and cautery dissection.  I eventually was able to  reduce all of the contents after 60 minutes of dissection.  Inspection of the omentum and small bowel revealed no injury.  The area  was mapped out and the defect was noted.  A proper mesh was selected and  #1 Prolene and #1 Ethibond stitches were placed on the rough side of the  edges of the Parietex mesh x10 total stitches of five and five each.  The tails were tucked in.  The mesh was rolled and placed through the  left upper quadrant defect from that port site - the port was removed.  The mesh was unrolled and oriented peripherally.  The mesh was secured  to the anterior abdominal wall using a laparoscopic suture passer, to  have such that the mesh was adhered to the abdominal wall using the 10  fascial interrupted stitches in a clockwise fashion.  Note that this was  done with the capnoperitoneum reduced to around 8 mmHg, to have the  abdominal tension reduced.  A 15-French Blake drain was then placed  through the left lower quadrant port site and placed under direct   visualization into the hernia sac, to help better collapse the hernia  sac onto the mesh.  The mesh edges were secured using a laparoscopic  tacker.  A visual inspection was again made, to make sure that there is  no evidence of any bleeding or leakage of bowel.  The mesh covered the  defect very well.  The 10mm port fascial defect was reapproximated using  a #1 Ethibond stitch, using a laparoscopic suture passer.  The  capnoperitoneum was evacuated.  The fascial stitch was tied down.  The  skin was closed using #4-0 Monocryl stitches on the port sites and Steri-  Strips on the small stab incisions.  A drain was secured using #2-0  nylon stitch.   The patient was extubated and sent to the recovery room in stable  condition.   I explained to operative findings to the patient's family.  Postoperative instructions and expected hospital stay were discussed as  well.  They expressed understanding and appreciation.      Shawna Sportsman, MD  Electronically Signed     SCG/MEDQ  D:  11/25/2006  T:  11/25/2006  Job:  [id number]   cc:   Shawna Savers, MD  [address] [place]  [place] [address]

## 2011-04-25 NOTE — Procedures (Signed)
Shawna Marquez, Shawna Marquez                  ACCOUNT NO.:  [id number]   MEDICAL RECORD NO.:  [id number]          PATIENT TYPE:  OUT   LOCATION:  SLEEP CENTER                 FACILITY:  [facility]   PHYSICIAN:  Marcelyn Bruins, M.D. [facility] DATE OF BIRTH:  1941-05-25   DATE OF STUDY:                              NOCTURNAL POLYSOMNOGRAM   REFERRING PHYSICIAN:  Dr. Birdie Sons.   INDICATION FOR THE STUDY:  Hypersomnia with sleep apnea.  Epworth score is  14.   SLEEP ARCHITECTURE:  The patient had a total sleep time of 398 minutes with  adequate REM and slow wave sleep.  Sleep onset latency was 1.5 minutes and  REM latency was very minimally prolonged.   IMPRESSION:  1.  Split night study reveals severe obstructive sleep apnea/hypopnea      syndrome with 141 obstructive events noted in the first 181 minutes of      sleep.  This gave the patient a respiratory disturbance index of 47      events per hour and oxygen desaturation as low as 85%.  Events were      clearly worse in the supine position.  There was extremely loud snoring      prior to CPAP initiation.  By protocol, the patient was placed on a      medium Comfort Select nasal mask and CPAP pressure was increased      incrementally.  At a final pressure of 8 cm, there was excellent control      of both obstructive events and snoring.  2.  No clinically significant cardiac arrhythmias.      KC/MEDQ  D:  12/03/2004 12:43:57  T:  12/03/2004 13:31:14  Job:  [id number]

## 2011-09-18 ENCOUNTER — Telehealth: Payer: Self-pay | Admitting: Internal Medicine

## 2011-09-18 NOTE — Telephone Encounter (Signed)
Spoke with pt - informed to call and make appt - they will send order to be faxed back . KIK

## 2011-09-18 NOTE — Telephone Encounter (Signed)
Was told to call Dr Kirtland Bouchard to set up a mammogram for November. Please order to [facility].

## 2011-10-03 ENCOUNTER — Encounter: Payer: Self-pay | Admitting: Internal Medicine

## 2011-10-03 LAB — HM MAMMOGRAPHY: HM Mammogram: NEGATIVE

## 2011-10-09 ENCOUNTER — Encounter: Payer: Self-pay | Admitting: Internal Medicine

## 2011-10-21 ENCOUNTER — Encounter: Payer: Self-pay | Admitting: Internal Medicine

## 2011-12-19 ENCOUNTER — Encounter: Payer: Medicare Other | Admitting: Internal Medicine

## 2011-12-25 ENCOUNTER — Encounter: Payer: Self-pay | Admitting: Internal Medicine

## 2012-01-05 ENCOUNTER — Ambulatory Visit (INDEPENDENT_AMBULATORY_CARE_PROVIDER_SITE_OTHER): Payer: Medicare Other | Admitting: Internal Medicine

## 2012-01-05 ENCOUNTER — Encounter: Payer: Self-pay | Admitting: Internal Medicine

## 2012-01-05 DIAGNOSIS — M899 Disorder of bone, unspecified: Secondary | ICD-10-CM

## 2012-01-05 DIAGNOSIS — E039 Hypothyroidism, unspecified: Secondary | ICD-10-CM

## 2012-01-05 DIAGNOSIS — M1990 Unspecified osteoarthritis, unspecified site: Secondary | ICD-10-CM | POA: Diagnosis not present

## 2012-01-05 DIAGNOSIS — M949 Disorder of cartilage, unspecified: Secondary | ICD-10-CM

## 2012-01-05 DIAGNOSIS — Z0000 Encounter for general adult medical examination without abnormal findings: Secondary | ICD-10-CM

## 2012-01-05 DIAGNOSIS — Z8719 Personal history of other diseases of the digestive system: Secondary | ICD-10-CM

## 2012-01-05 DIAGNOSIS — M8580 Other specified disorders of bone density and structure, unspecified site: Secondary | ICD-10-CM

## 2012-01-05 DIAGNOSIS — J309 Allergic rhinitis, unspecified: Secondary | ICD-10-CM

## 2012-01-05 DIAGNOSIS — Z853 Personal history of malignant neoplasm of breast: Secondary | ICD-10-CM

## 2012-01-05 LAB — COMPREHENSIVE METABOLIC PANEL
AST: 22 U/L (ref 0–37)
Albumin: 4.1 g/dL (ref 3.5–5.2)
Alkaline Phosphatase: 65 U/L (ref 39–117)
BUN: 14 mg/dL (ref 6–23)
Calcium: 9.3 mg/dL (ref 8.4–10.5)
Chloride: 104 mEq/L (ref 96–112)
GFR: 90.16 mL/min (ref >60.00)
Potassium: 4.5 mEq/L (ref 3.5–5.1)
Sodium: 141 mEq/L (ref 135–145)
Total Protein: 7.2 g/dL (ref 6.0–8.3)

## 2012-01-05 LAB — CBC WITH DIFFERENTIAL/PLATELET
Basophils Relative: 0.8 % (ref 0.0–3.0)
Eosinophils Absolute: 0.3 10*3/uL (ref 0.0–0.7)
HCT: 40.7 % (ref 36.0–46.0)
Lymphocytes Relative: 22.8 % (ref 12.0–46.0)
Lymphs Abs: 1.5 10*3/uL (ref 0.7–4.0)
Neutro Abs: 4.1 10*3/uL (ref 1.4–7.7)
Neutrophils Relative %: 64.8 % (ref 43.0–77.0)
Platelets: 218 10*3/uL (ref 150.0–400.0)
RBC: 4.33 Mil/uL (ref 3.87–5.11)
RDW: 12.8 % (ref 11.5–14.6)

## 2012-01-05 LAB — LIPID PANEL
HDL: 55.5 mg/dL (ref >39.00)
LDL Cholesterol: 96 mg/dL (ref 0–99)
Total CHOL/HDL Ratio: 3

## 2012-01-05 LAB — TSH: TSH: 0.95 u[IU]/mL (ref 0.35–5.50)

## 2012-01-05 MED ORDER — LEVOTHYROXINE SODIUM 125 MCG PO TABS: 125.0000 ug | ORAL_TABLET | Freq: Every day | ORAL | Status: DC

## 2012-01-05 MED ORDER — FLUTICASONE PROPIONATE 50 MCG/ACT NA SUSP: 1.0000 | Freq: Every day | NASAL | Status: DC | PRN

## 2012-01-05 NOTE — Progress Notes (Signed)
Subjective:    Patient ID: Shawna Marquez, female    DOB: 04-Dec-1941, 71 y.o.   MRN: [id number]  HPI  71 year old patient who is seen today for a wellness exam. She does quite well. He did have a colonoscopy in 2006. She has a history of left mastectomy for breast cancer 16 years ago. Since her last annual exam she has had bilateral cataract extraction surgery    Here for Medicare AWV:   1. Risk factors based on Past M, S, F history: financial risk factors none  2. Physical Activities: remains fairly active without exercise limitations  3. Depression/mood: history depression, or mood disorder  4. Hearing: no hearing deficits  5. ADL's: independent in all aspects of daily living  6. Fall Risk: low  7. Home Safety: no problems identified  8. Height, weight, &visual acuity:height and weight stable; planning on right Extraction surgery next month  9. Counseling: heart healthy diet modest weight loss, and more regular exercise regimen. All encouraged  10. Labs ordered based on risk factors: laboratory Profile will be reviewed  11. Referral Coordination- none appropriate at this time. Will need follow-up colonoscopy January 2016  12. Care Plan- calcium and vitamin D supplementation. Encouraged  13. Cognitive Assessment- alert and oriented, with normal affect. No cognitive dysfunction   Allergies (verified):  No Known Drug Allergies   Past History:  Past Medical History:  Reviewed history from 10/17/2008 and no changes required.   Osteoarthritis  Breast cancer, hx of stage IIb 1997  Diverticulitis, hx of  Allergic rhinitis  obstructive sleep apnea   Past Surgical History:  Reviewed history from 10/17/2008 and no changes required.   Colonoscopy January 2006  Mastectomy 1997  Hysterectomy 1975  correction of a colovaginal fistula. April 2007  repair of a ventral hernia, December 2007   Family History:  Reviewed history from 10/17/2008 and no changes required.   Family History of  DJD  Family History of DVT  Family History of CAD Female 1st degree relative <50  Family History Lung cancer  Family History of Cardiovascular disorder  Family History of Respiratory disease  father died age 73 lung cancer, history of COPD, coronary artery disease, status post CABG  mother died age 73, history of COPD, peripheral neuropathy, deep vein thrombosis  No siblings  Social History:  Reviewed history from 09/09/2007 and no changes required.   Retired  Married  Never Smoked   Review of Systems  Constitutional: Negative for fever, appetite change, fatigue and unexpected weight change.  HENT: Negative for hearing loss, ear pain, nosebleeds, congestion, sore throat, mouth sores, trouble swallowing, neck stiffness, dental problem, voice change, sinus pressure and tinnitus.   Eyes: Negative for photophobia, pain, redness and visual disturbance.  Respiratory: Negative for cough, chest tightness and shortness of breath.   Cardiovascular: Negative for chest pain, palpitations and leg swelling.  Gastrointestinal: Negative for nausea, vomiting, abdominal pain, diarrhea, constipation, blood in stool, abdominal distention and rectal pain.  Genitourinary: Negative for dysuria, urgency, frequency, hematuria, flank pain, vaginal bleeding, vaginal discharge, difficulty urinating, genital sores, vaginal pain, menstrual problem and pelvic pain.  Musculoskeletal: Negative for back pain and arthralgias.  Skin: Negative for rash.  Neurological: Negative for dizziness, syncope, speech difficulty, weakness, light-headedness, numbness and headaches.  Hematological: Negative for adenopathy. Does not bruise/bleed easily.  Psychiatric/Behavioral: Negative for suicidal ideas, behavioral problems, self-injury, dysphoric mood and agitation. The patient is not nervous/anxious.        Objective:   Physical Exam  Constitutional: She is oriented to person, place, and time. She appears well-developed and  well-nourished.  HENT:  Head: Normocephalic and atraumatic.  Right Ear: External ear normal.  Left Ear: External ear normal.  Mouth/Throat: Oropharynx is clear and moist.  Eyes: Conjunctivae and EOM are normal.  Neck: Normal range of motion. Neck supple. No JVD present. No thyromegaly present.  Cardiovascular: Normal rate, regular rhythm, normal heart sounds and intact distal pulses.   No murmur heard. Pulmonary/Chest: Effort normal and breath sounds normal. She has no wheezes. She has no rales.  Abdominal: Soft. Bowel sounds are normal. She exhibits no distension and no mass. There is no tenderness. There is no rebound and no guarding.  Musculoskeletal: Normal range of motion. She exhibits no edema and no tenderness.  Neurological: She is alert and oriented to person, place, and time. She has normal reflexes. No cranial nerve deficit. She exhibits normal muscle tone. Coordination normal.  Skin: Skin is warm and dry. No rash noted.  Psychiatric: She has a normal mood and affect. Her behavior is normal.          Assessment & Plan:   Preventive health examination. Will review screening lab Osteoarthritis stable Hypothyroidism. We'll check a TSH Mild osteopenia. We'll check a vitamin D level  Recheck one year

## 2012-01-05 NOTE — Patient Instructions (Signed)
Limit your sodium (Salt) intake    It is important that you exercise regularly, at least 20 minutes 3 to 4 times per week.  If you develop chest pain or shortness of breath seek  medical attention.  Return in one year for follow-up  Take a calcium supplement, plus 800-1200 units of vitamin D 

## 2012-09-16 DIAGNOSIS — H52209 Unspecified astigmatism, unspecified eye: Secondary | ICD-10-CM | POA: Diagnosis not present

## 2012-09-16 DIAGNOSIS — H40019 Open angle with borderline findings, low risk, unspecified eye: Secondary | ICD-10-CM | POA: Diagnosis not present

## 2012-09-16 DIAGNOSIS — H43819 Vitreous degeneration, unspecified eye: Secondary | ICD-10-CM | POA: Diagnosis not present

## 2012-09-16 DIAGNOSIS — H02839 Dermatochalasis of unspecified eye, unspecified eyelid: Secondary | ICD-10-CM | POA: Diagnosis not present

## 2012-10-06 DIAGNOSIS — Z1231 Encounter for screening mammogram for malignant neoplasm of breast: Secondary | ICD-10-CM | POA: Diagnosis not present

## 2012-10-06 LAB — HM MAMMOGRAPHY: HM Mammogram: NEGATIVE

## 2012-10-07 ENCOUNTER — Encounter: Payer: Self-pay | Admitting: Internal Medicine

## 2013-01-07 ENCOUNTER — Other Ambulatory Visit: Payer: Self-pay | Admitting: Internal Medicine

## 2013-01-25 ENCOUNTER — Other Ambulatory Visit: Payer: Self-pay | Admitting: *Deleted

## 2013-01-25 NOTE — Telephone Encounter (Signed)
Left message on voicemail.

## 2013-01-26 NOTE — Telephone Encounter (Signed)
Pt needs an appointment not seen since 12/2011 can not refill medication till appt.

## 2013-01-27 ENCOUNTER — Other Ambulatory Visit: Payer: Self-pay | Admitting: *Deleted

## 2013-01-27 MED ORDER — LEVOTHYROXINE SODIUM 125 MCG PO TABS: 125.0000 ug | ORAL_TABLET | Freq: Every day | ORAL | Status: DC

## 2013-01-27 MED ORDER — FLUTICASONE PROPIONATE 50 MCG/ACT NA SUSP: 1.0000 | Freq: Every day | NASAL | Status: DC

## 2013-02-08 ENCOUNTER — Encounter: Payer: Self-pay | Admitting: Internal Medicine

## 2013-02-08 ENCOUNTER — Ambulatory Visit (INDEPENDENT_AMBULATORY_CARE_PROVIDER_SITE_OTHER): Payer: Medicare Other | Admitting: Internal Medicine

## 2013-02-08 VITALS — BP 130/90 | HR 86 | Temp 97.5°F | Resp 18 | Ht 60.5 in | Wt 176.0 lb

## 2013-02-08 DIAGNOSIS — Z23 Encounter for immunization: Secondary | ICD-10-CM | POA: Diagnosis not present

## 2013-02-08 DIAGNOSIS — E039 Hypothyroidism, unspecified: Secondary | ICD-10-CM | POA: Diagnosis not present

## 2013-02-08 DIAGNOSIS — M1990 Unspecified osteoarthritis, unspecified site: Secondary | ICD-10-CM

## 2013-02-08 DIAGNOSIS — Z0000 Encounter for general adult medical examination without abnormal findings: Secondary | ICD-10-CM | POA: Diagnosis not present

## 2013-02-08 DIAGNOSIS — Z853 Personal history of malignant neoplasm of breast: Secondary | ICD-10-CM

## 2013-02-08 DIAGNOSIS — Z8719 Personal history of other diseases of the digestive system: Secondary | ICD-10-CM

## 2013-02-08 LAB — CBC WITH DIFFERENTIAL/PLATELET
Basophils Absolute: 0 10*3/uL (ref 0.0–0.1)
Basophils Relative: 0.5 % (ref 0.0–3.0)
HCT: 42.1 % (ref 36.0–46.0)
Hemoglobin: 14.1 g/dL (ref 12.0–15.0)
Lymphs Abs: 1.7 10*3/uL (ref 0.7–4.0)
MCHC: 33.4 g/dL (ref 30.0–36.0)
Monocytes Relative: 7.9 % (ref 3.0–12.0)
Neutro Abs: 4.3 10*3/uL (ref 1.4–7.7)
RDW: 12.8 % (ref 11.5–14.6)

## 2013-02-08 LAB — LIPID PANEL
Cholesterol: 160 mg/dL (ref 0–200)
HDL: 52.3 mg/dL (ref >39.00)
LDL Cholesterol: 90 mg/dL (ref 0–99)
Total CHOL/HDL Ratio: 3
Triglycerides: 90 mg/dL (ref 0.0–149.0)
VLDL: 18 mg/dL (ref 0.0–40.0)

## 2013-02-08 LAB — COMPREHENSIVE METABOLIC PANEL
ALT: 18 U/L (ref 0–35)
AST: 19 U/L (ref 0–37)
Albumin: 3.9 g/dL (ref 3.5–5.2)
Alkaline Phosphatase: 58 U/L (ref 39–117)
BUN: 12 mg/dL (ref 6–23)
CO2: 29 mEq/L (ref 19–32)
Calcium: 9.5 mg/dL (ref 8.4–10.5)
Chloride: 102 mEq/L (ref 96–112)
Creatinine, Ser: 0.6 mg/dL (ref 0.4–1.2)
GFR: 96.41 mL/min (ref >60.00)
Glucose, Bld: 93 mg/dL (ref 70–99)
Potassium: 4.5 mEq/L (ref 3.5–5.1)
Sodium: 138 mEq/L (ref 135–145)
Total Bilirubin: 0.6 mg/dL (ref 0.3–1.2)
Total Protein: 7.3 g/dL (ref 6.0–8.3)

## 2013-02-08 MED ORDER — LEVOTHYROXINE SODIUM 125 MCG PO TABS: 125.0000 ug | ORAL_TABLET | Freq: Every day | ORAL | Status: DC

## 2013-02-08 MED ORDER — FLUTICASONE PROPIONATE 50 MCG/ACT NA SUSP: NASAL | Status: DC

## 2013-02-08 MED ORDER — FLUTICASONE PROPIONATE 50 MCG/ACT NA SUSP: 1.0000 | Freq: Every day | NASAL | Status: DC

## 2013-02-08 NOTE — Patient Instructions (Signed)
Limit your sodium (Salt) intake    It is important that you exercise regularly, at least 20 minutes 3 to 4 times per week.  If you develop chest pain or shortness of breath seek  medical attention.  Take a calcium supplement, plus 800-1200 units of vitamin D  Return in one year for follow-up  

## 2013-02-08 NOTE — Progress Notes (Signed)
Patient ID: Shawna Marquez, female   DOB: May 15, 1941, 72 y.o.   MRN: [id number]  Subjective:    Patient ID: Shawna Marquez, female    DOB: July 26, 1941, 72 y.o.   MRN: [id number]  HPI  72 year-old patient who is seen today for a wellness exam. She does quite well. He did have a colonoscopy in 2006. She has a history of left mastectomy for breast cancer 16 years ago. Since her last annual exam she has had bilateral cataract extraction surgery    Here for Medicare AWV:   1. Risk factors based on Past M, S, F history: financial risk factors none  2. Physical Activities: remains fairly active without exercise limitations  3. Depression/mood: history depression, or mood disorder  4. Hearing: no hearing deficits  5. ADL's: independent in all aspects of daily living  6. Fall Risk: low  7. Home Safety: no problems identified  8. Height, weight, &visual acuity:height and weight stable; planning on right Extraction surgery next month  9. Counseling: heart healthy diet modest weight loss, and more regular exercise regimen. All encouraged  10. Labs ordered based on risk factors: laboratory Profile will be reviewed  11. Referral Coordination- none appropriate at this time. Will need follow-up colonoscopy January 2016  12. Care Plan- calcium and vitamin D supplementation. Encouraged  13. Cognitive Assessment- alert and oriented, with normal affect. No cognitive dysfunction   Allergies (verified):  No Known Drug Allergies   Past History:  Past Medical History:  Reviewed history from 10/17/2008 and no changes required.   Osteoarthritis  Breast cancer, hx of stage IIb 1997  Diverticulitis, hx of  Allergic rhinitis  obstructive sleep apnea   Past Surgical History:  Reviewed history from 10/17/2008 and no changes required.   Colonoscopy January 2006  Mastectomy 1997  Hysterectomy 1975  correction of a colovaginal fistula. April 2007  repair of a ventral hernia, December 2007  Bilateral cataract  extraction surgery  Family History:  Reviewed history from 10/17/2008 and no changes required.   Family History of DJD  Family History of DVT  Family History of CAD Female 1st degree relative <50  Family History Lung cancer  Family History of Cardiovascular disorder  Family History of Respiratory disease  father died age 36 lung cancer, history of COPD, coronary artery disease, status post CABG  mother died age 17, history of COPD, peripheral neuropathy, deep vein thrombosis  No siblings  Social History:  Reviewed history from 09/09/2007 and no changes required.   Retired  Married  Never Smoked   Review of Systems  Constitutional: Negative for fever, appetite change, fatigue and unexpected weight change.  HENT: Negative for hearing loss, ear pain, nosebleeds, congestion, sore throat, mouth sores, trouble swallowing, neck stiffness, dental problem, voice change, sinus pressure and tinnitus.   Eyes: Negative for photophobia, pain, redness and visual disturbance.  Respiratory: Negative for cough, chest tightness and shortness of breath.   Cardiovascular: Negative for chest pain, palpitations and leg swelling.  Gastrointestinal: Negative for nausea, vomiting, abdominal pain, diarrhea, constipation, blood in stool, abdominal distention and rectal pain.  Genitourinary: Negative for dysuria, urgency, frequency, hematuria, flank pain, vaginal bleeding, vaginal discharge, difficulty urinating, genital sores, vaginal pain, menstrual problem and pelvic pain.  Musculoskeletal: Negative for back pain and arthralgias.  Skin: Negative for rash.  Neurological: Negative for dizziness, syncope, speech difficulty, weakness, light-headedness, numbness and headaches.  Hematological: Negative for adenopathy. Does not bruise/bleed easily.  Psychiatric/Behavioral: Negative for suicidal ideas, behavioral problems, self-injury,  dysphoric mood and agitation. The patient is not nervous/anxious.         Objective:   Physical Exam  Constitutional: She is oriented to person, place, and time. She appears well-developed and well-nourished.  HENT:  Head: Normocephalic and atraumatic.  Right Ear: External ear normal.  Left Ear: External ear normal.  Mouth/Throat: Oropharynx is clear and moist.  Eyes: Conjunctivae and EOM are normal.  Neck: Normal range of motion. Neck supple. No JVD present. No thyromegaly present.  Cardiovascular: Normal rate, regular rhythm, normal heart sounds and intact distal pulses.   No murmur heard. Pulmonary/Chest: Effort normal and breath sounds normal. She has no wheezes. She has no rales.  Status post left mastectomy  Abdominal: Soft. Bowel sounds are normal. She exhibits no distension and no mass. There is no tenderness. There is no rebound and no guarding.  Musculoskeletal: Normal range of motion. She exhibits no edema and no tenderness.  Neurological: She is alert and oriented to person, place, and time. She has normal reflexes. No cranial nerve deficit. She exhibits normal muscle tone. Coordination normal.  Skin: Skin is warm and dry. No rash noted.  Feet are dry and scaly  Psychiatric: She has a normal mood and affect. Her behavior is normal.          Assessment & Plan:   Preventive health examination. Will review screening lab Osteoarthritis stable Hypothyroidism. We'll check a TSH Mild osteopenia. We'll check a vitamin D level  Recheck one year

## 2013-04-06 ENCOUNTER — Encounter: Payer: Self-pay | Admitting: Internal Medicine

## 2013-08-09 ENCOUNTER — Other Ambulatory Visit: Payer: Self-pay | Admitting: Internal Medicine

## 2013-08-23 DIAGNOSIS — I781 Nevus, non-neoplastic: Secondary | ICD-10-CM | POA: Diagnosis not present

## 2013-08-23 DIAGNOSIS — Z85828 Personal history of other malignant neoplasm of skin: Secondary | ICD-10-CM | POA: Diagnosis not present

## 2013-08-23 DIAGNOSIS — D235 Other benign neoplasm of skin of trunk: Secondary | ICD-10-CM | POA: Diagnosis not present

## 2013-10-03 DIAGNOSIS — H43819 Vitreous degeneration, unspecified eye: Secondary | ICD-10-CM | POA: Diagnosis not present

## 2013-10-03 DIAGNOSIS — H40019 Open angle with borderline findings, low risk, unspecified eye: Secondary | ICD-10-CM | POA: Diagnosis not present

## 2013-10-03 DIAGNOSIS — Z961 Presence of intraocular lens: Secondary | ICD-10-CM | POA: Diagnosis not present

## 2013-10-27 ENCOUNTER — Ambulatory Visit: Payer: Medicare Other

## 2013-11-09 DIAGNOSIS — Z1231 Encounter for screening mammogram for malignant neoplasm of breast: Secondary | ICD-10-CM | POA: Diagnosis not present

## 2013-11-09 DIAGNOSIS — Z803 Family history of malignant neoplasm of breast: Secondary | ICD-10-CM | POA: Diagnosis not present

## 2013-11-19 ENCOUNTER — Encounter: Payer: Self-pay | Admitting: Internal Medicine

## 2014-02-10 ENCOUNTER — Encounter: Payer: Medicare Other | Admitting: Internal Medicine

## 2014-02-24 ENCOUNTER — Ambulatory Visit (INDEPENDENT_AMBULATORY_CARE_PROVIDER_SITE_OTHER): Payer: Medicare Other | Admitting: Internal Medicine

## 2014-02-24 ENCOUNTER — Encounter: Payer: Self-pay | Admitting: Internal Medicine

## 2014-02-24 VITALS — BP 126/80 | HR 105 | Temp 98.6°F | Resp 20 | Ht 61.0 in | Wt 175.0 lb

## 2014-02-24 DIAGNOSIS — Z23 Encounter for immunization: Secondary | ICD-10-CM

## 2014-02-24 DIAGNOSIS — Z853 Personal history of malignant neoplasm of breast: Secondary | ICD-10-CM | POA: Diagnosis not present

## 2014-02-24 DIAGNOSIS — J309 Allergic rhinitis, unspecified: Secondary | ICD-10-CM | POA: Diagnosis not present

## 2014-02-24 DIAGNOSIS — E039 Hypothyroidism, unspecified: Secondary | ICD-10-CM | POA: Diagnosis not present

## 2014-02-24 DIAGNOSIS — M1990 Unspecified osteoarthritis, unspecified site: Secondary | ICD-10-CM | POA: Diagnosis not present

## 2014-02-24 DIAGNOSIS — Z0000 Encounter for general adult medical examination without abnormal findings: Secondary | ICD-10-CM | POA: Diagnosis not present

## 2014-02-24 LAB — COMPREHENSIVE METABOLIC PANEL
ALBUMIN: 4.1 g/dL (ref 3.5–5.2)
ALT: 31 U/L (ref 0–35)
AST: 33 U/L (ref 0–37)
Alkaline Phosphatase: 67 U/L (ref 39–117)
BILIRUBIN TOTAL: 0.7 mg/dL (ref 0.3–1.2)
BUN: 11 mg/dL (ref 6–23)
CALCIUM: 9.4 mg/dL (ref 8.4–10.5)
CHLORIDE: 104 meq/L (ref 96–112)
CO2: 28 meq/L (ref 19–32)
Creatinine, Ser: 0.7 mg/dL (ref 0.4–1.2)
GFR: 91.18 mL/min (ref >60.00)
GLUCOSE: 85 mg/dL (ref 70–99)
Potassium: 4.1 mEq/L (ref 3.5–5.1)
SODIUM: 139 meq/L (ref 135–145)
TOTAL PROTEIN: 7.2 g/dL (ref 6.0–8.3)

## 2014-02-24 LAB — CBC WITH DIFFERENTIAL/PLATELET
Basophils Absolute: 0 10*3/uL (ref 0.0–0.1)
Basophils Relative: 0.5 % (ref 0.0–3.0)
EOS PCT: 5.2 % — AB (ref 0.0–5.0)
Eosinophils Absolute: 0.4 10*3/uL (ref 0.0–0.7)
HEMATOCRIT: 41.7 % (ref 36.0–46.0)
Hemoglobin: 13.9 g/dL (ref 12.0–15.0)
LYMPHS PCT: 25 % (ref 12.0–46.0)
Lymphs Abs: 1.7 10*3/uL (ref 0.7–4.0)
MCHC: 33.4 g/dL (ref 30.0–36.0)
MCV: 93.2 fl (ref 78.0–100.0)
Monocytes Absolute: 0.5 10*3/uL (ref 0.1–1.0)
Monocytes Relative: 7.8 % (ref 3.0–12.0)
NEUTROS PCT: 61.5 % (ref 43.0–77.0)
Neutro Abs: 4.1 10*3/uL (ref 1.4–7.7)
Platelets: 251 10*3/uL (ref 150.0–400.0)
RBC: 4.47 Mil/uL (ref 3.87–5.11)
RDW: 13.2 % (ref 11.5–14.6)
WBC: 6.7 10*3/uL (ref 4.5–10.5)

## 2014-02-24 LAB — LIPID PANEL
Cholesterol: 179 mg/dL (ref 0–200)
HDL: 57.1 mg/dL (ref >39.00)
LDL Cholesterol: 104 mg/dL — ABNORMAL HIGH (ref 0–99)
Total CHOL/HDL Ratio: 3
Triglycerides: 91 mg/dL (ref 0.0–149.0)
VLDL: 18.2 mg/dL (ref 0.0–40.0)

## 2014-02-24 LAB — T4, FREE: Free T4: 1.28 ng/dL (ref 0.60–1.60)

## 2014-02-24 LAB — T3, FREE: T3 FREE: 2.6 pg/mL (ref 2.3–4.2)

## 2014-02-24 LAB — TSH: TSH: 0.48 u[IU]/mL (ref 0.35–5.50)

## 2014-02-24 MED ORDER — LEVOTHYROXINE SODIUM 125 MCG PO TABS: ORAL_TABLET | ORAL | Status: DC

## 2014-02-24 MED ORDER — FLUTICASONE PROPIONATE 50 MCG/ACT NA SUSP: NASAL | Status: DC

## 2014-02-24 NOTE — Patient Instructions (Addendum)
Take a calcium supplement, plus [phone] units of vitamin D    It is important that you exercise regularly, at least 20 minutes 3 to 4 times per week.  If you develop chest pain or shortness of breath seek  medical attention.  Return in one year for follow-up  Kegel Exercises The goal of Kegel exercises is to isolate and exercise your pelvic floor muscles. These muscles act as a hammock that supports the rectum, vagina, small intestine, and uterus. As the muscles weaken, the hammock sags and these organs are displaced from their normal positions. Kegel exercises can strengthen your pelvic floor muscles and help you to improve bladder and bowel control, improve sexual response, and help reduce many problems and some discomfort during pregnancy. Kegel exercises can be done anywhere and at any time. HOW TO PERFORM KEGEL EXERCISES 1. Locate your pelvic floor muscles. To do this, squeeze (contract) the muscles that you use when you try to stop the flow of urine. You will feel a tightness in the vaginal area (women) and a tight lift in the rectal area (men and women). 2. When you begin, contract your pelvic muscles tight for 2 5 seconds, then relax them for 2 5 seconds. This is one set. Do 4 5 sets with a short pause in between. 3. Contract your pelvic muscles for 8 10 seconds, then relax them for 8 10 seconds. Do 4 5 sets. If you cannot contract your pelvic muscles for 8 10 seconds, try 5 7 seconds and work your way up to 8 10 seconds. Your goal is 4 5 sets of 10 contractions each day. Keep your stomach, buttocks, and legs relaxed during the exercises. Perform sets of both short and long contractions. Vary your positions. Perform these contractions 3 4 times per day. Perform sets while you are:   Lying in bed in the morning.  Standing at lunch.  Sitting in the late afternoon.  Lying in bed at night. You should do 40 50 contractions per day. Do not perform more Kegel exercises per day than  recommended. Overexercising can cause muscle fatigue. Continue these exercises for for at least 15 20 weeks or as directed by your caregiver. Document Released: 11/10/2012 Document Reviewed: 11/10/2012 [facility] Patient Information 2014 [place], [place].

## 2014-02-24 NOTE — Progress Notes (Signed)
Patient ID: Shawna Marquez, female   DOB: 03/18/1941, 73 y.o.   MRN: [id number]  Subjective:    Patient ID: Shawna Marquez, female    DOB: March 26, 1941, 73 y.o.   MRN: [id number]  HPI  73 year-old patient who is seen today for a wellness exam. She does quite well. He did have a colonoscopy in 2006. She has a history of left mastectomy for breast cancer 17  years ago. She has had bilateral cataract extraction surgery. She complains of some dry brittle fingernails and she also complains of some mild fecal incontinence    Here for Medicare AWV:   1. Risk factors based on Past M, S, F history: Cardiovascular risk factors none , except age 59. Physical Activities: remains fairly active without exercise limitations  3. Depression/mood: history depression, or mood disorder  4. Hearing: no hearing deficits  5. ADL's: independent in all aspects of daily living  6. Fall Risk: low  7. Home Safety: no problems identified  8. Height, weight, &visual acuity:height and weight stable; planning on right Extraction surgery next month  9. Counseling: heart healthy diet modest weight loss, and more regular exercise regimen. All encouraged  10. Labs ordered based on risk factors: laboratory Profile will be reviewed  11. Referral Coordination- none appropriate at this time. Will need follow-up colonoscopy January 2016  12. Care Plan- calcium and vitamin D supplementation. Encouraged  13. Cognitive Assessment- alert and oriented, with normal affect. No cognitive dysfunction   Allergies (verified):  No Known Drug Allergies   Past History:  Past Medical History:    Osteoarthritis  Breast cancer, hx of stage IIb 1997  Diverticulitis, hx of  Allergic rhinitis  obstructive sleep apnea   Past Surgical History:    Colonoscopy January 2006  Mastectomy 1997  Hysterectomy 1975  correction of a colovaginal fistula. April 2007  repair of a ventral hernia, December 2007  Bilateral cataract extraction  surgery  Family History:    Family History of DJD  Family History of DVT  Family History of CAD Female 1st degree relative <50  Family History Lung cancer  Family History of Cardiovascular disorder  Family History of Respiratory disease  father died age 50 lung cancer, history of COPD, coronary artery disease, status post CABG  mother died age 84, history of COPD, peripheral neuropathy, deep vein thrombosis  No siblings  Social History:   Retired  Married  Never Smoked   Review of Systems  Constitutional: Negative for fever, appetite change, fatigue and unexpected weight change.  HENT: Negative for congestion, dental problem, ear pain, hearing loss, mouth sores, nosebleeds, sinus pressure, sore throat, tinnitus, trouble swallowing and voice change.   Eyes: Negative for photophobia, pain, redness and visual disturbance.  Respiratory: Negative for cough, chest tightness and shortness of breath.   Cardiovascular: Negative for chest pain, palpitations and leg swelling.  Gastrointestinal: Negative for nausea, vomiting, abdominal pain, diarrhea, constipation, blood in stool, abdominal distention and rectal pain.  Genitourinary: Negative for dysuria, urgency, frequency, hematuria, flank pain, vaginal bleeding, vaginal discharge, difficulty urinating, genital sores, vaginal pain, menstrual problem and pelvic pain.  Musculoskeletal: Negative for arthralgias, back pain and neck stiffness.  Skin: Negative for rash.  Neurological: Negative for dizziness, syncope, speech difficulty, weakness, light-headedness, numbness and headaches.  Hematological: Negative for adenopathy. Does not bruise/bleed easily.  Psychiatric/Behavioral: Negative for suicidal ideas, behavioral problems, self-injury, dysphoric mood and agitation. The patient is not nervous/anxious.    and     Objective:  Physical Exam  Constitutional: She is oriented to person, place, and time. She appears well-developed and  well-nourished.  HENT:  Head: Normocephalic and atraumatic.  Right Ear: External ear normal.  Left Ear: External ear normal.  Mouth/Throat: Oropharynx is clear and moist.  Eyes: Conjunctivae and EOM are normal.  Neck: Normal range of motion. Neck supple. No JVD present. No thyromegaly present.  Cardiovascular: Normal rate, regular rhythm, normal heart sounds and intact distal pulses.   No murmur heard. Pulmonary/Chest: Effort normal and breath sounds normal. She has no wheezes. She has no rales.  Status post left mastectomy  Abdominal: Soft. Bowel sounds are normal. She exhibits no distension and no mass. There is no tenderness. There is no rebound and no guarding.  Genitourinary:  Weak anal tone.  No mass appreciated.  Stool heme-negative  Musculoskeletal: Normal range of motion. She exhibits no edema and no tenderness.  Neurological: She is alert and oriented to person, place, and time. She has normal reflexes. No cranial nerve deficit. She exhibits normal muscle tone. Coordination normal.  Skin: Skin is warm and dry. No rash noted.  Feet are dry and scaly  Psychiatric: She has a normal mood and affect. Her behavior is normal.          Assessment & Plan:   Preventive health examination. Will review screening lab Osteoarthritis stable Hypothyroidism. We'll check a TSH Mild osteopenia. We'll check a vitamin D level  Recheck one year

## 2014-02-24 NOTE — Progress Notes (Signed)
Pre-visit discussion using our clinic review tool. No additional management support is needed unless otherwise documented below in the visit note.  

## 2014-03-03 ENCOUNTER — Telehealth: Payer: Self-pay | Admitting: Internal Medicine

## 2014-03-03 NOTE — Telephone Encounter (Signed)
Pt is wanting to know the results from her labs, states she only has 2 pills left of her levothyroxine (SYNTHROID, LEVOTHROID) 125 MCG tablet. Pt dr. Raliegh Ip stated that if her numbers changed he was going to change the dosage, so she needs to know if she is to continue using the rx or if it's different based on her results. Pt also requesting a copy of her lab results as well.

## 2014-03-03 NOTE — Telephone Encounter (Signed)
Please call/notify patient that lab/test/procedure is normal.  Continue present dose of Synthroid.  Please call in a new prescription

## 2014-03-03 NOTE — Telephone Encounter (Signed)
See message and please advise.  

## 2014-03-03 NOTE — Telephone Encounter (Signed)
Spoke to pt told her lab test was normal continue present dose of synthroid per Dr. Raliegh Ip.  Told her will send Rx. Pt verbalized understanding and stated Rx already there just wanted to make sure dosage did not change.

## 2014-03-15 ENCOUNTER — Other Ambulatory Visit: Payer: Self-pay | Admitting: Internal Medicine

## 2014-06-07 DIAGNOSIS — L821 Other seborrheic keratosis: Secondary | ICD-10-CM | POA: Diagnosis not present

## 2014-06-07 DIAGNOSIS — L820 Inflamed seborrheic keratosis: Secondary | ICD-10-CM | POA: Diagnosis not present

## 2014-07-18 ENCOUNTER — Other Ambulatory Visit: Payer: Self-pay | Admitting: Internal Medicine

## 2014-07-18 NOTE — Telephone Encounter (Signed)
Denied.  Filled for 6 months in 03/2014

## 2014-10-05 DIAGNOSIS — H40013 Open angle with borderline findings, low risk, bilateral: Secondary | ICD-10-CM | POA: Diagnosis not present

## 2014-10-05 DIAGNOSIS — H43813 Vitreous degeneration, bilateral: Secondary | ICD-10-CM | POA: Diagnosis not present

## 2014-10-05 DIAGNOSIS — H52203 Unspecified astigmatism, bilateral: Secondary | ICD-10-CM | POA: Diagnosis not present

## 2014-10-05 DIAGNOSIS — Z961 Presence of intraocular lens: Secondary | ICD-10-CM | POA: Diagnosis not present

## 2014-11-10 DIAGNOSIS — Z853 Personal history of malignant neoplasm of breast: Secondary | ICD-10-CM | POA: Diagnosis not present

## 2014-11-10 DIAGNOSIS — Z1231 Encounter for screening mammogram for malignant neoplasm of breast: Secondary | ICD-10-CM | POA: Diagnosis not present

## 2014-11-13 DIAGNOSIS — N63 Unspecified lump in breast: Secondary | ICD-10-CM | POA: Diagnosis not present

## 2014-11-13 LAB — HM MAMMOGRAPHY

## 2014-11-15 ENCOUNTER — Encounter: Payer: Self-pay | Admitting: Internal Medicine

## 2014-11-15 ENCOUNTER — Other Ambulatory Visit: Payer: Self-pay | Admitting: Radiology

## 2014-11-15 DIAGNOSIS — D0511 Intraductal carcinoma in situ of right breast: Secondary | ICD-10-CM | POA: Diagnosis not present

## 2014-11-15 DIAGNOSIS — C50911 Malignant neoplasm of unspecified site of right female breast: Secondary | ICD-10-CM | POA: Diagnosis not present

## 2014-11-15 DIAGNOSIS — D0591 Unspecified type of carcinoma in situ of right breast: Secondary | ICD-10-CM | POA: Diagnosis not present

## 2014-11-16 ENCOUNTER — Other Ambulatory Visit: Payer: Self-pay | Admitting: Radiology

## 2014-11-16 DIAGNOSIS — C50911 Malignant neoplasm of unspecified site of right female breast: Secondary | ICD-10-CM

## 2014-11-16 DIAGNOSIS — D0511 Intraductal carcinoma in situ of right breast: Secondary | ICD-10-CM

## 2014-11-17 ENCOUNTER — Telehealth: Payer: Self-pay | Admitting: *Deleted

## 2014-11-17 ENCOUNTER — Encounter: Payer: Self-pay | Admitting: *Deleted

## 2014-11-17 DIAGNOSIS — C50219 Malignant neoplasm of upper-inner quadrant of unspecified female breast: Secondary | ICD-10-CM

## 2014-11-17 DIAGNOSIS — C50211 Malignant neoplasm of upper-inner quadrant of right female breast: Secondary | ICD-10-CM

## 2014-11-17 DIAGNOSIS — Z853 Personal history of malignant neoplasm of breast: Secondary | ICD-10-CM | POA: Insufficient documentation

## 2014-11-17 HISTORY — DX: Malignant neoplasm of upper-inner quadrant of unspecified female breast: C50.219

## 2014-11-17 NOTE — Telephone Encounter (Signed)
Confirmed [facility] for 11/22/14 at 0800 .  Instructions and contact information given.

## 2014-11-22 ENCOUNTER — Encounter: Payer: Self-pay | Admitting: *Deleted

## 2014-11-22 ENCOUNTER — Other Ambulatory Visit (INDEPENDENT_AMBULATORY_CARE_PROVIDER_SITE_OTHER): Payer: Self-pay | Admitting: General Surgery

## 2014-11-22 ENCOUNTER — Encounter: Payer: Self-pay | Admitting: Hematology and Oncology

## 2014-11-22 ENCOUNTER — Ambulatory Visit
Admission: RE | Admit: 2014-11-22 | Discharge: 2014-11-22 | Disposition: A | Discharge status: Home / Self Care | Payer: Medicare Other | Source: Ambulatory Visit | Attending: Radiation Oncology | Admitting: Radiation Oncology

## 2014-11-22 ENCOUNTER — Encounter (INDEPENDENT_AMBULATORY_CARE_PROVIDER_SITE_OTHER): Payer: Self-pay

## 2014-11-22 ENCOUNTER — Other Ambulatory Visit (HOSPITAL_BASED_OUTPATIENT_CLINIC_OR_DEPARTMENT_OTHER): Payer: Medicare Other

## 2014-11-22 ENCOUNTER — Ambulatory Visit (HOSPITAL_BASED_OUTPATIENT_CLINIC_OR_DEPARTMENT_OTHER): Payer: Medicare Other | Admitting: Hematology and Oncology

## 2014-11-22 ENCOUNTER — Ambulatory Visit: Payer: Medicare Other

## 2014-11-22 VITALS — BP 132/62 | HR 75 | Temp 98.1°F | Resp 18 | Ht 61.0 in | Wt 173.8 lb

## 2014-11-22 DIAGNOSIS — Z170 Estrogen receptor positive status [ER+]: Secondary | ICD-10-CM | POA: Diagnosis not present

## 2014-11-22 DIAGNOSIS — C50211 Malignant neoplasm of upper-inner quadrant of right female breast: Secondary | ICD-10-CM

## 2014-11-22 DIAGNOSIS — C50911 Malignant neoplasm of unspecified site of right female breast: Secondary | ICD-10-CM

## 2014-11-22 DIAGNOSIS — C50219 Malignant neoplasm of upper-inner quadrant of unspecified female breast: Secondary | ICD-10-CM | POA: Diagnosis not present

## 2014-11-22 LAB — COMPREHENSIVE METABOLIC PANEL (CC13)
ALK PHOS: 87 U/L (ref 40–150)
ALT: 29 U/L (ref 0–55)
AST: 24 U/L (ref 5–34)
Albumin: 3.8 g/dL (ref 3.5–5.0)
Anion Gap: 8 mEq/L (ref 3–11)
BUN: 16.3 mg/dL (ref 7.0–26.0)
CO2: 29 mEq/L (ref 22–29)
Calcium: 9.3 mg/dL (ref 8.4–10.4)
Chloride: 104 mEq/L (ref 98–109)
Creatinine: 0.8 mg/dL (ref 0.6–1.1)
EGFR: 78 mL/min/{1.73_m2} — ABNORMAL LOW (ref >90)
Glucose: 91 mg/dl (ref 70–140)
Potassium: 4.3 mEq/L (ref 3.5–5.1)
Sodium: 142 mEq/L (ref 136–145)
Total Bilirubin: 0.32 mg/dL (ref 0.20–1.20)
Total Protein: 7 g/dL (ref 6.4–8.3)

## 2014-11-22 LAB — CBC WITH DIFFERENTIAL/PLATELET
BASO%: 1.2 % (ref 0.0–2.0)
BASOS ABS: 0.1 10*3/uL (ref 0.0–0.1)
EOS%: 5.9 % (ref 0.0–7.0)
Eosinophils Absolute: 0.4 10*3/uL (ref 0.0–0.5)
HEMATOCRIT: 42.6 % (ref 34.8–46.6)
HEMOGLOBIN: 13.8 g/dL (ref 11.6–15.9)
LYMPH%: 30.2 % (ref 14.0–49.7)
MCH: 30.7 pg (ref 25.1–34.0)
MCHC: 32.4 g/dL (ref 31.5–36.0)
MCV: 94.6 fL (ref 79.5–101.0)
MONO#: 0.6 10*3/uL (ref 0.1–0.9)
MONO%: 10.3 % (ref 0.0–14.0)
NEUT#: 3.1 10*3/uL (ref 1.5–6.5)
NEUT%: 52.4 % (ref 38.4–76.8)
PLATELETS: 223 10*3/uL (ref 145–400)
RBC: 4.5 10*6/uL (ref 3.70–5.45)
RDW: 13 % (ref 11.2–14.5)
WBC: 6 10*3/uL (ref 3.9–10.3)
lymph#: 1.8 10*3/uL (ref 0.9–3.3)

## 2014-11-22 NOTE — Assessment & Plan Note (Signed)
Right breast invasive ductal carcinoma with DCIS:0.9 cm, T1 BN 0M0 stage IA clinical stage, ER/PR positive HER-2 negative, Ki-67 20%, lymphovascular invasion is present, grade 2  Pathology and radiology counseling:Discussed with the patient, the details of pathology including the type of breast cancer,the clinical staging, the significance of ER, PR and HER-2/neu receptors and the implications for treatment. After reviewing the pathology in detail, we proceeded to discuss the different treatment options between surgery, radiation, chemotherapy, antiestrogen therapies.  Recommendation: Lumpectomy followed by radiation followed by antiestrogen therapy  I briefly discussed that she has very favorable type of breast cancer and that unless there was any other changes in the final pathology we probably do not need to do any other testing like Oncotype DX. She will return back to see [place] after surgery to discuss a final treatment plan.  I briefly discussed that antiestrogen therapy could be with aromatase inhibitors like Arimidex and explained the mechanism of action and discussed briefly the side effects. She had previously received tamoxifen and is quite aware of antiestrogen side effects.

## 2014-11-22 NOTE — Progress Notes (Signed)
Radiation Oncology         [phone]) [phone] ________________________________  Initial outpatient Consultation - Date: 11/22/2014   Name: Shawna Marquez MRN: [id number]   DOB: [birth year]  REFERRING PHYSICIAN: Rolm Bookbinder, MD  DIAGNOSIS:    ICD-9-CM ICD-10-CM   1. 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 breast 174.2 C50.211     STAGE: 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 breast   Staging form: Breast, AJCC 7th Edition     Clinical stage from 11/22/2014: Stage IA (T1b, N0, M0) - Unsigned  HISTORY Shawna Marquez is a 73 y.o. female who underwent a screening mammogram. She had a history of a lest mastectomy and ALND followed by [facility] with tamoxifen x 5 years. She did not receive radiation. An abnormality was found in the upper outer quadrant of the right breast. A biopsy was performed which showed a Grade 2 invasive ductal carcinoma with DCIS and LVI and ER+PR+HEr2- with Ki67 with 20%. She is GxP3 with her first birth at 75. She was on HRT for a year and a half before she developed breast cancer. She has had a TAH but still has her ovaries.   PREVIOUS RADIATION THERAPY: No  FAMILY HISTORY:  Family History  Problem Relation Age of Onset  . Heart disease Neg Hx     family hx  . Cancer Neg Hx     lung ca  . COPD Neg Hx     family hx  . Breast cancer Paternal Grandmother     SOCIAL HISTORY:  History  Substance Use Topics  . Smoking status: Never Smoker   . Smokeless tobacco: Never Used  . Alcohol Use: Yes     Comment: occassional beer    REVIEW OF SYSTEMS:  A 15 point review of systems is documented in the electronic medical record. This was obtained by the nursing staff. However, I reviewed this with the patient to discuss relevant findings and make appropriate changes.  Pertinent positives are included in the chart.   PHYSICAL EXAM: There were no vitals filed for this visit.. . She is pleasant and appears her stated age. Her  mastectomy scar is clean with no signs of recurrence. She has shoddy nodes in her right axilla. She is s/p biopsy on the right with bruising. She is alert and oriented.   IMPRESSION: T1bN0 Invasive Ductal Carcinoma of the Right Breast  PLAN: I spoke to the patient today regarding her diagnosis and options for treatment. We discussed the equivalence in terms of survival and local failure between mastectomy and breast conservation. We discussed the role of radiation in decreasing local failures in patients who undergo mastectomy and have positive lymph nodes or tumors greater than 5 cm. We discussed the role of radiation and decreasing local failures in patients who undergo lumpectomy.We discussed the possible side effects including but not limited to asymptomatic rib, heart and lung damage, heart disease, skin redness, fatigue, permanent skin darkening, and chest wall swelling. We discussed increased complications that can occur with reconstruction after radiation. We discussed the process of simulation and the placement tattoos. We discussed the low likelihood of secondary malignancies.    She is leaning toward lumpectomy. We discussed the results of elderly trials that show equivalency between radiation plus antiestrogen vs antiestrogen alone in patients who undergo lumpectomy. I think she should have one treatment after lumpectomy at least given her health.   We cancelled her MRI for Friday as I  do not think that will be beneficial. She met with social work as well as our Building services engineer. She met with our breast cancer navigators.   I spent 40 minutes  face to face with the patient and more than 50% of that time was spent in counseling and/or coordination of care.   ------------------------------------------------  Thea Silversmith, MD

## 2014-11-22 NOTE — Progress Notes (Signed)
Ms. Bousquet is a very pleasant 73 y.o.Marland Kitchen female from [place], [place] with newly diagnosed grade 2 invasive ductal carcinoma, as well as foci of ductal carcinoma in situ of the right breast.  Biopsy results revealed the tumor's hormone status as ER positive, PR positive, and HER2/neu negative. Ki67 is 20%.  She presents today with her husband to the [facility]) for treatment consideration and recommendations from the breast surgeon, radiation oncologist, and medical oncologist.     I briefly met with Ms. Garlitz and her husband during her [facility] visit today. We discussed the purpose of the Survivorship Clinic, which will include monitoring for recurrence, coordinating completion of age and gender-appropriate cancer screenings, promotion of overall wellness, as well as managing potential late/long-term side effects of anti-cancer treatments.    As of today, the treatment plan for Ms. Harlin will likely include surgery and radiation therapy. Endocrine therapy with an aromatase inhibitor will be considered for her as well.The intent of treatment for Ms. Joo is cure, therefore she will be eligible for the Survivorship Clinic upon her completion of treatment.  Her survivorship care plan (SCP) document will be drafted and updated throughout the course of her treatment trajectory. She will receive the SCP in an office visit with myself in the Survivorship Clinic once she has completed treatment.   Ms. Howser was encouraged to ask questions and all questions were answered to her satisfaction.  She was given my business card and encouraged to contact me with any concerns regarding survivorship.  I look forward to participating in her care.   Mike Craze, NP

## 2014-11-22 NOTE — Progress Notes (Signed)
[place] NOTE  Patient Care Team: Marletta Lor, MD as PCP - General Rolm Bookbinder, MD as Consulting Physician (General Surgery) Rulon Eisenmenger, MD as Consulting Physician (Hematology and Oncology) Thea Silversmith, MD as Consulting Physician (Radiation Oncology) Trinda Pascal, NP as Nurse Practitioner (Nurse Practitioner)  CHIEF COMPLAINTS/PURPOSE OF CONSULTATION:  Newly diagnosed breast cancer  HISTORY OF PRESENTING ILLNESS:  Shawna Marquez 73 y.o. female is here because of recent diagnosis of right breast cancer. Patient has a previous history of left-sided breast cancer that was treated with mastectomy followed by chemotherapy on CALGB 9397 clinical trial and she was randomized to the arm where she received Adriamycin followed by Cytoxan this was followed by tamoxifen for 5 years. Apparently she had stage IIB breast cancer. This had involved to lymph nodes. She was followed by Dr. Marin Olp for about 10 years. Recently she had a routine screening mammogram that revealed a right rest abnormality by ultrasound measured 0.9 cm. This was biopsy-proven to be invasive ductal carcinoma with DCIS along with lymphovascular invasion. The tumor was estrogen progesterone positive and HER-2 negative with a Ki-67 of 20%. She is accompanied by her husband and her daughter to discuss different treatment options. She was present this morning at the multidisciplinary breast tumor board.  I reviewed her records extensively and collaborated the history with the patient.  SUMMARY OF ONCOLOGIC HISTORY:   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 breast   06/07/1996 Initial Biopsy Left breast cancer treated with lumpectomy and chemotherapy with Adriamycin and Cytoxan followed by radiation therapy.    Mammogram 9 mm mass at 1:00 position   11/15/2014 Initial Biopsy Right breast needle biopsy: Grade 2 invasive ductal carcinoma with DCIS with lymphovascular invasion  ER/PR positive HER-2 negative Ki-67 20%    In terms of breast cancer risk profile:  She menarched at early age of 57 and went to menopause at age 34 She had 3 pregnancy, her first child was born at age 89  She has not received birth control pills.  She was never exposed to fertility medications or hormone replacement therapy.  She has no family history of Breast/GYN/GI cancer  MEDICAL HISTORY:  Past Medical History  Diagnosis Date  . ALLERGIC RHINITIS 10/12/2007  . BREAST CANCER, HX OF 10/12/2007  . DIVERTICULITIS, HX OF 10/12/2007  . HYPOTHYROIDISM, PRIMARY 10/12/2007  . OSTEOARTHRITIS 09/09/2007  . 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 Sleep apnea     SURGICAL HISTORY: Past Surgical History  Procedure Laterality Date  . Abdominal hysterectomy    . Mastectomy  1997  . Hernia repair  2007    ventral  . Colovaginal fistula      SOCIAL HISTORY: History   Social History  . Marital Status: Married    Spouse Name: N/A    Number of Children: N/A  . Years of Education: N/A   Occupational History  . Not on file.   Social History Main Topics  . Smoking status: Never Smoker   . Smokeless tobacco: Never Used  . Alcohol Use: Yes     Comment: occassional beer  . Drug Use: No  . Sexual Activity: Not on file   Other Topics Concern  . Not on file   Social History Narrative    FAMILY HISTORY: Family History  Problem Relation Age of Onset  . Heart disease Neg Hx     family hx  . Cancer Neg Hx  lung ca  . COPD Neg Hx     family hx  . Breast cancer Paternal Grandmother     ALLERGIES:  has No Known Allergies.  MEDICATIONS:  Current Outpatient Prescriptions  Medication Sig Dispense Refill  . calcium carbonate 200 MG capsule Take 250 mg by mouth 2 (two) times daily with a meal.    . co-enzyme Q-10 30 MG capsule Take 100 mg by mouth 2 (two) times daily.    . fluticasone (FLONASE) 50 MCG/ACT nasal spray USE 1 SPRAY IN EACH NOSTRIL TWICE DAILY  48 g 5  . folic acid (FOLVITE) 784 MCG tablet Take 400 mcg by mouth daily.    Marland Kitchen levothyroxine (SYNTHROID, LEVOTHROID) 125 MCG tablet TAKE 1 TABLET BY MOUTH DAILY 90 tablet 3  . Multiple Vitamin (MULTIVITAMIN) tablet Take 1 tablet by mouth daily.    . Omega-3 Fatty Acids (OMEGA 3 PO) Take by mouth.    . Turmeric 450 MG CAPS Take by mouth.    . vitamin C (ASCORBIC ACID) 500 MG tablet Take 500 mg by mouth daily.    Marland Kitchen aspirin 81 MG tablet Take 81 mg by mouth daily.       No current facility-administered medications for this visit.    REVIEW OF SYSTEMS:   Constitutional: Denies fevers, chills or abnormal night sweats Eyes: Denies blurriness of vision, double vision or watery eyes Ears, nose, mouth, throat, and face: Denies mucositis or sore throat Respiratory: Denies cough, dyspnea or wheezes Cardiovascular: Denies palpitation, chest discomfort or lower extremity swelling Gastrointestinal:  Denies nausea, heartburn or change in bowel habits Skin: Denies abnormal skin rashes Lymphatics: Denies new lymphadenopathy or easy bruising Neurological:Denies numbness, tingling or new weaknesses Behavioral/Psych: Mood is stable, no new changes  Breast:  Denies any palpable lumps or discharge All other systems were reviewed with the patient and are negative.  PHYSICAL EXAMINATION: ECOG PERFORMANCE STATUS: 0 - Asymptomatic  Filed Vitals:   11/22/14 0851  BP: 132/62  Pulse: 75  Temp: 98.1 F (36.7 C)  Resp: 18   Filed Weights   11/22/14 0851  Weight: 173 lb 12.8 oz (78.835 kg)    GENERAL:alert, no distress and comfortable SKIN: skin color, texture, turgor are normal, no rashes or significant lesions EYES: normal, conjunctiva are pink and non-injected, sclera clear OROPHARYNX:no exudate, no erythema and lips, buccal mucosa, and tongue normal  NECK: supple, thyroid normal size, non-tender, without nodularity LYMPH:  no palpable lymphadenopathy in the cervical, axillary or inguinal LUNGS:  clear to auscultation and percussion with normal breathing effort HEART: regular rate & rhythm and no murmurs and no lower extremity edema ABDOMEN:abdomen soft, non-tender and normal bowel sounds Musculoskeletal:no cyanosis of digits and no clubbing  PSYCH: alert & oriented x 3 with fluent speech NEURO: no focal motor/sensory deficits BREAST: No palpable nodules in breast. No palpable axillary or supraclavicular lymphadenopathy  LABORATORY DATA:  I have reviewed the data as listed Lab Results  Component Value Date   WBC 6.0 11/22/2014   HGB 13.8 11/22/2014   HCT 42.6 11/22/2014   MCV 94.6 11/22/2014   PLT 223 11/22/2014   Lab Results  Component Value Date   NA 142 11/22/2014   K 4.3 11/22/2014   CL 104 02/24/2014   CO2 29 11/22/2014    RADIOGRAPHIC STUDIES: I have personally reviewed the radiological reports and agreed with the findings in the report. Results summarized as above  ASSESSMENT AND PLAN:  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 breast  Right breast invasive ductal carcinoma with DCIS:0.9 cm, T1 BN 0M0 stage IA clinical stage, ER/PR positive HER-2 negative, Ki-67 20%, lymphovascular invasion is present, grade 2  Pathology and radiology counseling:Discussed with the patient, the details of pathology including the type of breast cancer,the clinical staging, the significance of ER, PR and HER-2/neu receptors and the implications for treatment. After reviewing the pathology in detail, we proceeded to discuss the different treatment options between surgery, radiation, chemotherapy, antiestrogen therapies.  Recommendation: Lumpectomy followed by radiation followed by antiestrogen therapy  I briefly discussed that she has very favorable type of breast cancer and that unless there was any other changes in the final pathology we probably do not need to do any other testing like Oncotype DX. She will return back to see [place] after surgery to discuss a final  treatment plan.  I briefly discussed that antiestrogen therapy could be with aromatase inhibitors like Arimidex and explained the mechanism of action and discussed briefly the side effects. She had previously received tamoxifen and is quite aware of antiestrogen side effects.    Rulon Eisenmenger, MD 11/22/2014 12:26 PM

## 2014-11-22 NOTE — Progress Notes (Signed)
Checked in new patient with no issues prior to seeing the dr. She has primary/secondary ins. She has appt card and not traveled

## 2014-11-22 NOTE — Progress Notes (Signed)
[place] Psychosocial Distress Screening Clinical Social Work  Patient completed distress screening protocol and scored a 0 on the Psychosocial Distress Thermometer which indicates no distress. Clinical Social Worker met with patient and patient's family in [facility] to assess for distress and other psychosocial needs.  Patient stated she was doing well and had no concerns at this time.  CSW informed patient of the support team and support services at [facility], and encouraged patient and family to call with any questions or concerns.       Johnnye Lana, MSW, LCSW, OSW-C Clinical Social Worker [facility] [phone]

## 2014-11-23 ENCOUNTER — Encounter (HOSPITAL_BASED_OUTPATIENT_CLINIC_OR_DEPARTMENT_OTHER): Payer: Self-pay | Admitting: *Deleted

## 2014-11-23 NOTE — Progress Notes (Signed)
MD note created during office visit sent to scan.  Copy given to pt.

## 2014-11-23 NOTE — Progress Notes (Signed)
Pt does not know when seeds will be done-,unless am of surgery, arrive 1230pm No more labs needed, done 11/22/14

## 2014-11-24 ENCOUNTER — Other Ambulatory Visit: Payer: Medicare Other

## 2014-11-24 ENCOUNTER — Telehealth: Payer: Self-pay | Admitting: *Deleted

## 2014-11-24 NOTE — Telephone Encounter (Signed)
Received call from patient requesting to change her genetic appointment due to her having surgery the day before.  Confirmed new appointment for 12/04/14 at 10am.

## 2014-11-27 ENCOUNTER — Encounter: Payer: Self-pay | Admitting: *Deleted

## 2014-11-27 ENCOUNTER — Telehealth: Payer: Self-pay | Admitting: Hematology and Oncology

## 2014-11-27 NOTE — Telephone Encounter (Signed)
S/w pt confirming MD visit per 12/21 POF..... KJ

## 2014-11-28 ENCOUNTER — Ambulatory Visit (HOSPITAL_BASED_OUTPATIENT_CLINIC_OR_DEPARTMENT_OTHER)
Admission: RE | Admit: 2014-11-28 | Discharge: 2014-11-28 | Disposition: A | Discharge status: Home / Self Care | Payer: Medicare Other | Source: Ambulatory Visit | Attending: General Surgery | Admitting: General Surgery

## 2014-11-28 ENCOUNTER — Ambulatory Visit (HOSPITAL_BASED_OUTPATIENT_CLINIC_OR_DEPARTMENT_OTHER): Payer: Medicare Other | Admitting: Anesthesiology

## 2014-11-28 ENCOUNTER — Ambulatory Visit (HOSPITAL_COMMUNITY)
Admission: RE | Admit: 2014-11-28 | Discharge: 2014-11-28 | Disposition: A | Discharge status: Home / Self Care | Payer: Medicare Other | Source: Ambulatory Visit | Attending: General Surgery | Admitting: General Surgery

## 2014-11-28 ENCOUNTER — Encounter (HOSPITAL_BASED_OUTPATIENT_CLINIC_OR_DEPARTMENT_OTHER): Payer: Self-pay

## 2014-11-28 ENCOUNTER — Encounter (HOSPITAL_BASED_OUTPATIENT_CLINIC_OR_DEPARTMENT_OTHER)
Admission: RE | Disposition: A | Discharge status: Home / Self Care | Payer: Self-pay | Source: Ambulatory Visit | Attending: General Surgery

## 2014-11-28 DIAGNOSIS — E079 Disorder of thyroid, unspecified: Secondary | ICD-10-CM | POA: Diagnosis not present

## 2014-11-28 DIAGNOSIS — M1990 Unspecified osteoarthritis, unspecified site: Secondary | ICD-10-CM | POA: Diagnosis not present

## 2014-11-28 DIAGNOSIS — G4730 Sleep apnea, unspecified: Secondary | ICD-10-CM | POA: Insufficient documentation

## 2014-11-28 DIAGNOSIS — C50211 Malignant neoplasm of upper-inner quadrant of right female breast: Secondary | ICD-10-CM | POA: Diagnosis not present

## 2014-11-28 DIAGNOSIS — Z0189 Encounter for other specified special examinations: Secondary | ICD-10-CM | POA: Diagnosis not present

## 2014-11-28 DIAGNOSIS — C50911 Malignant neoplasm of unspecified site of right female breast: Secondary | ICD-10-CM | POA: Insufficient documentation

## 2014-11-28 DIAGNOSIS — R079 Chest pain, unspecified: Secondary | ICD-10-CM | POA: Diagnosis not present

## 2014-11-28 DIAGNOSIS — N63 Unspecified lump in breast: Secondary | ICD-10-CM | POA: Diagnosis not present

## 2014-11-28 DIAGNOSIS — C773 Secondary and unspecified malignant neoplasm of axilla and upper limb lymph nodes: Secondary | ICD-10-CM | POA: Diagnosis not present

## 2014-11-28 DIAGNOSIS — G8918 Other acute postprocedural pain: Secondary | ICD-10-CM | POA: Diagnosis not present

## 2014-11-28 HISTORY — DX: Presence of spectacles and contact lenses: Z97.3

## 2014-11-28 HISTORY — PX: RADIOACTIVE SEED GUIDED PARTIAL MASTECTOMY WITH AXILLARY SENTINEL LYMPH NODE BIOPSY: SHX6520

## 2014-11-28 HISTORY — DX: Other complications of anesthesia, initial encounter: T88.59XA

## 2014-11-28 HISTORY — DX: Adverse effect of unspecified anesthetic, initial encounter: T41.45XA

## 2014-11-28 SURGERY — RADIOACTIVE SEED GUIDED PARTIAL MASTECTOMY WITH AXILLARY SENTINEL LYMPH NODE BIOPSY
Anesthesia: General | Site: Breast | Laterality: Right

## 2014-11-28 MED ORDER — DEXAMETHASONE SODIUM PHOSPHATE 4 MG/ML IJ SOLN
INTRAMUSCULAR | Status: DC | PRN
  Administered 2014-11-28: 10 mg via INTRAVENOUS

## 2014-11-28 MED ORDER — EPHEDRINE SULFATE 50 MG/ML IJ SOLN
INTRAMUSCULAR | Status: DC | PRN
  Administered 2014-11-28: 10 mg via INTRAVENOUS

## 2014-11-28 MED ORDER — CEFAZOLIN SODIUM-DEXTROSE 2-3 GM-% IV SOLR
2.0000 g | INTRAVENOUS | Status: AC
  Administered 2014-11-28: 2 g via INTRAVENOUS

## 2014-11-28 MED ORDER — FENTANYL CITRATE 0.05 MG/ML IJ SOLN
INTRAMUSCULAR | Status: AC
  Filled 2014-11-28: qty 6

## 2014-11-28 MED ORDER — FENTANYL CITRATE 0.05 MG/ML IJ SOLN
INTRAMUSCULAR | Status: AC
  Filled 2014-11-28: qty 4

## 2014-11-28 MED ORDER — MIDAZOLAM HCL 2 MG/2ML IJ SOLN
INTRAMUSCULAR | Status: AC
  Filled 2014-11-28: qty 2

## 2014-11-28 MED ORDER — OXYCODONE-ACETAMINOPHEN 10-325 MG PO TABS: 1.0000 | ORAL_TABLET | Freq: Four times a day (QID) | ORAL | Status: AC | PRN

## 2014-11-28 MED ORDER — BUPIVACAINE-EPINEPHRINE (PF) 0.5% -1:200000 IJ SOLN
INTRAMUSCULAR | Status: DC | PRN
  Administered 2014-11-28: 25 mL via PERINEURAL

## 2014-11-28 MED ORDER — LACTATED RINGERS IV SOLN
INTRAVENOUS | Status: DC
  Administered 2014-11-28: 13:00:00 via INTRAVENOUS

## 2014-11-28 MED ORDER — TECHNETIUM TC 99M SULFUR COLLOID FILTERED
1.0000 | Freq: Once | INTRAVENOUS | Status: AC | PRN
  Administered 2014-11-28: 1 via INTRADERMAL

## 2014-11-28 MED ORDER — MIDAZOLAM HCL 2 MG/2ML IJ SOLN
1.0000 mg | INTRAMUSCULAR | Status: DC | PRN
  Administered 2014-11-28: 2 mg via INTRAVENOUS

## 2014-11-28 MED ORDER — CEFAZOLIN SODIUM-DEXTROSE 2-3 GM-% IV SOLR
INTRAVENOUS | Status: AC
  Filled 2014-11-28: qty 50

## 2014-11-28 MED ORDER — FENTANYL CITRATE 0.05 MG/ML IJ SOLN
INTRAMUSCULAR | Status: DC | PRN
  Administered 2014-11-28: 25 ug via INTRAVENOUS

## 2014-11-28 MED ORDER — BUPIVACAINE HCL (PF) 0.25 % IJ SOLN
INTRAMUSCULAR | Status: DC | PRN
  Administered 2014-11-28: 2 mL

## 2014-11-28 MED ORDER — PROPOFOL 10 MG/ML IV BOLUS
INTRAVENOUS | Status: DC | PRN
  Administered 2014-11-28: 200 mg via INTRAVENOUS

## 2014-11-28 MED ORDER — FENTANYL CITRATE 0.05 MG/ML IJ SOLN
50.0000 ug | INTRAMUSCULAR | Status: DC | PRN
  Administered 2014-11-28: 100 ug via INTRAVENOUS

## 2014-11-28 MED ORDER — ONDANSETRON HCL 4 MG/2ML IJ SOLN
INTRAMUSCULAR | Status: DC | PRN
  Administered 2014-11-28: 4 mg via INTRAVENOUS

## 2014-11-28 MED ORDER — LIDOCAINE HCL (CARDIAC) 20 MG/ML IV SOLN
INTRAVENOUS | Status: DC | PRN
  Administered 2014-11-28: 50 mg via INTRAVENOUS

## 2014-11-28 MED ORDER — FENTANYL CITRATE 0.05 MG/ML IJ SOLN
INTRAMUSCULAR | Status: AC
  Filled 2014-11-28: qty 2

## 2014-11-28 SURGICAL SUPPLY — 62 items
APL SKNCLS STERI-STRIP NONHPOA (GAUZE/BANDAGES/DRESSINGS) ×1
APPLIER CLIP 9.375 MED OPEN (MISCELLANEOUS) ×3
APR CLP MED 9.3 20 MLT OPN (MISCELLANEOUS) ×1
BENZOIN TINCTURE PRP APPL 2/3 (GAUZE/BANDAGES/DRESSINGS) ×3 IMPLANT
BINDER BREAST LRG (GAUZE/BANDAGES/DRESSINGS) IMPLANT
BINDER BREAST MEDIUM (GAUZE/BANDAGES/DRESSINGS) IMPLANT
BINDER BREAST XLRG (GAUZE/BANDAGES/DRESSINGS) ×2 IMPLANT
BINDER BREAST XXLRG (GAUZE/BANDAGES/DRESSINGS) IMPLANT
BLADE SURG 15 STRL LF DISP TIS (BLADE) ×1 IMPLANT
BLADE SURG 15 STRL SS (BLADE) ×3
CANISTER SUC SOCK COL 7IN (MISCELLANEOUS) IMPLANT
CANISTER SUCT 1200ML W/VALVE (MISCELLANEOUS) ×2 IMPLANT
CHLORAPREP W/TINT 26ML (MISCELLANEOUS) ×3 IMPLANT
CLIP APPLIE 9.375 MED OPEN (MISCELLANEOUS) ×1 IMPLANT
CLOSURE WOUND 1/2 X4 (GAUZE/BANDAGES/DRESSINGS) ×1
COVER BACK TABLE 60X90IN (DRAPES) ×3 IMPLANT
COVER MAYO STAND STRL (DRAPES) ×3 IMPLANT
COVER PROBE W GEL 5X96 (DRAPES) ×3 IMPLANT
DECANTER SPIKE VIAL GLASS SM (MISCELLANEOUS) IMPLANT
DEVICE DUBIN W/COMP PLATE 8390 (MISCELLANEOUS) ×3 IMPLANT
DRAPE LAPAROSCOPIC ABDOMINAL (DRAPES) ×3 IMPLANT
DRSG TEGADERM 4X4.75 (GAUZE/BANDAGES/DRESSINGS) ×3 IMPLANT
ELECT COATED BLADE 2.86 ST (ELECTRODE) ×3 IMPLANT
ELECT REM PT RETURN 9FT ADLT (ELECTROSURGICAL) ×3
ELECTRODE REM PT RTRN 9FT ADLT (ELECTROSURGICAL) ×1 IMPLANT
GLOVE BIO SURGEON STRL SZ7 (GLOVE) ×10 IMPLANT
GLOVE BIOGEL PI IND STRL 7.5 (GLOVE) ×1 IMPLANT
GLOVE BIOGEL PI INDICATOR 7.5 (GLOVE) ×2
GOWN STRL REUS W/ TWL LRG LVL3 (GOWN DISPOSABLE) ×2 IMPLANT
GOWN STRL REUS W/TWL LRG LVL3 (GOWN DISPOSABLE) ×3
KIT MARKER MARGIN INK (KITS) ×3 IMPLANT
LIQUID BAND (GAUZE/BANDAGES/DRESSINGS) ×3 IMPLANT
MARKER SKIN DUAL TIP RULER LAB (MISCELLANEOUS) ×6 IMPLANT
NDL HYPO 25X1 1.5 SAFETY (NEEDLE) ×1 IMPLANT
NDL SAFETY ECLIPSE 18X1.5 (NEEDLE) IMPLANT
NEEDLE HYPO 18GX1.5 SHARP (NEEDLE)
NEEDLE HYPO 25X1 1.5 SAFETY (NEEDLE) ×3 IMPLANT
NS IRRIG 1000ML POUR BTL (IV SOLUTION) IMPLANT
PACK BASIN DAY SURGERY FS (CUSTOM PROCEDURE TRAY) ×3 IMPLANT
PENCIL BUTTON HOLSTER BLD 10FT (ELECTRODE) ×3 IMPLANT
SHEET MEDIUM DRAPE 40X70 STRL (DRAPES) IMPLANT
SLEEVE SCD COMPRESS KNEE MED (MISCELLANEOUS) ×3 IMPLANT
SPONGE GAUZE 4X4 12PLY STER LF (GAUZE/BANDAGES/DRESSINGS) ×3 IMPLANT
SPONGE LAP 4X18 X RAY DECT (DISPOSABLE) ×3 IMPLANT
STAPLER VISISTAT 35W (STAPLE) ×3 IMPLANT
STOCKINETTE IMPERVIOUS LG (DRAPES) IMPLANT
STRIP CLOSURE SKIN 1/2X4 (GAUZE/BANDAGES/DRESSINGS) ×2 IMPLANT
SUT ETHILON 2 0 FS 18 (SUTURE) IMPLANT
SUT MNCRL AB 4-0 PS2 18 (SUTURE) ×5 IMPLANT
SUT MON AB 5-0 PS2 18 (SUTURE) IMPLANT
SUT SILK 2 0 SH (SUTURE) IMPLANT
SUT VIC AB 2-0 SH 27 (SUTURE) ×6
SUT VIC AB 2-0 SH 27XBRD (SUTURE) ×1 IMPLANT
SUT VIC AB 3-0 SH 27 (SUTURE) ×3
SUT VIC AB 3-0 SH 27X BRD (SUTURE) ×1 IMPLANT
SUT VIC AB 5-0 PS2 18 (SUTURE) IMPLANT
SYR CONTROL 10ML LL (SYRINGE) ×3 IMPLANT
TOWEL OR 17X24 6PK STRL BLUE (TOWEL DISPOSABLE) ×3 IMPLANT
TOWEL OR NON WOVEN STRL DISP B (DISPOSABLE) ×3 IMPLANT
TUBE CONNECTING 20'X1/4 (TUBING) ×1
TUBE CONNECTING 20X1/4 (TUBING) ×2 IMPLANT
YANKAUER SUCT BULB TIP NO VENT (SUCTIONS) IMPLANT

## 2014-11-28 NOTE — Anesthesia Preprocedure Evaluation (Addendum)
Anesthesia Evaluation  Patient identified by MRN, date of birth, ID band Patient awake    Reviewed: Allergy & Precautions, H&P , NPO status , Patient's Chart, lab work & pertinent test results, reviewed documented beta blocker date and time   Airway Mallampati: I  TM Distance: >3 FB Neck ROM: Full    Dental  (+) Teeth Intact, Dental Advisory Given   Pulmonary sleep apnea and Continuous Positive Airway Pressure Ventilation ,  breath sounds clear to auscultation        Cardiovascular negative cardio ROS  Rhythm:Regular Rate:Normal  EKG 02/2014 OK    Neuro/Psych    GI/Hepatic negative GI ROS, Neg liver ROS,   Endo/Other    Renal/GU      Musculoskeletal   Abdominal   Peds  Hematology   Anesthesia Other Findings   Reproductive/Obstetrics                           Anesthesia Physical Anesthesia Plan  ASA: II  Anesthesia Plan: General   Post-op Pain Management:    Induction: Intravenous  Airway Management Planned: LMA  Additional Equipment:   Intra-op Plan:   Post-operative Plan: Extubation in OR  Informed Consent: I have reviewed the patients History and Physical, chart, labs and discussed the procedure including the risks, benefits and alternatives for the proposed anesthesia with the patient or authorized representative who has indicated his/her understanding and acceptance.   Dental advisory given  Plan Discussed with: CRNA, Anesthesiologist and Surgeon  Anesthesia Plan Comments:         Anesthesia Quick Evaluation

## 2014-11-28 NOTE — Op Note (Signed)
Preoperative diagnosis: Clinical stage I right breast cancer Postoperative diagnosis: Same as above Procedure: #1 right breast radioactive seed Lumpectomy #2 Right axillary sentinel lymph node biopsy Surgeon: Dr. Serita Grammes Anesthesia: Gen. With pectoral block Specimens: #1 right breast tissue marked with paint #2 right Axillary sentinel nodes was counts of 57 and 402 Drains: None Complications: None Sponge count was correct at completion Disposition to recovery in stable condition  Indications: This a 73 year old female with a history of left breast cancer who presents with a mammographic finding on the right. She underwent evaluation is appears to be a clinical stage I right breast cancer. She was seen in our multidisciplinary clinic. She has elected to undergo lumpectomy with sentinel node biopsy.  Procedure: She had a radioactive seed placed prior to coming. I had her mammograms available in the operating room.  After informed consent was obtained the patient was first injected with technetium in a standard periareolar fashion. She was then taken to the operating room. She was placed under general anesthesia after undergoing a pectoral block. She was then prepped and draped in the standard sterile surgical fashion. A surgical timeout was then performed.  I located the seed in the right upper inner quadrant of her right breast. I then made a curvilinear incision. I used the neoprobe to guide excision of the seed as well as the surrounding tissue. This was then marked with paint. There was an attempt to get a clear margin. I then did a Faxitron mammogram which confirmed removal of the mass as well as the clip. I felt my margins were all clear. This was confirmed by radiology. I then placed 2 clips deep. I placed 1 clip in each position around the cavity. Hemostasis was observed. I then closed this with 2-0 Vicryl, 3-0 Vicryl, and 4-0 Monocryl. Glue and Steri-Strips are placed over  this.  I then made an incision below the axillary hairline. I dissected through the axillary fascia. I then used the neoprobe to guide me to remove what appeared to be 2 sentinel nodes. These were removed. There was no palpable adenopathy. There was no background radioactivity. I then obtained hemostasis. I closed the fascia with 2-0 Vicryl. The skin was closed with 3-0 Vicryl and 4-0 Monocryl. Glue and Steri-Strips were placed over this. A breast binder was placed. She tolerated this well was extubated and transferred to the recovery room in stable condition.

## 2014-11-28 NOTE — Progress Notes (Signed)
Assisted Dr. Crews with right, ultrasound guided, pectoralis block. Side rails up, monitors on throughout procedure. See vital signs in flow sheet. Tolerated Procedure well. 

## 2014-11-28 NOTE — Anesthesia Postprocedure Evaluation (Signed)
  Anesthesia Post-op Note  Patient: Shawna Marquez  Procedure(s) Performed: Procedure(s): RADIOACTIVE SEED GUIDED RIGHT LUMPECTOMY WITH RIGHT AXILLARY SENTINEL LYMPH NODE BIOPSY (Right)  Patient Location: PACU  Anesthesia Type: General with Pectoralis block for post op pain  Level of Consciousness: awake, alert  and oriented  Airway and Oxygen Therapy: Patient Spontanous Breathing  Post-op Pain: none  Post-op Assessment: Post-op Vital signs reviewed  Post-op Vital Signs: Reviewed  Last Vitals:  Filed Vitals:   11/28/14 1600  BP: 144/74  Pulse: 98  Temp:   Resp: 16    Complications: No apparent anesthesia complications

## 2014-11-28 NOTE — Discharge Instructions (Signed)
[facility] °Office Phone Number [phone] ° °BREAST BIOPSY/ PARTIAL MASTECTOMY: POST OP INSTRUCTIONS ° °Always review your discharge instruction sheet given to you by the facility where your surgery was performed. ° °IF YOU HAVE DISABILITY OR FAMILY LEAVE FORMS, YOU MUST BRING THEM TO THE OFFICE FOR PROCESSING.  DO NOT GIVE THEM TO YOUR DOCTOR. ° °1. A prescription for pain medication may be given to you upon discharge.  Take your pain medication as prescribed, if needed.  If narcotic pain medicine is not needed, then you may take acetaminophen (Tylenol), naprosyn (Alleve) or ibuprofen (Advil) as needed. °2. Take your usually prescribed medications unless otherwise directed °3. If you need a refill on your pain medication, please contact your pharmacy.  They will contact our office to request authorization.  Prescriptions will not be filled after 5pm or on week-ends. °4. You should eat very light the first 24 hours after surgery, such as soup, crackers, pudding, etc.  Resume your normal diet the day after surgery. °5. Most patients will experience some swelling and bruising in the breast.  Ice packs and a good support bra will help.  Wear the breast binder provided or a sports bra for 72 hours day and night.  After that wear a sports bra during the day until you return to the office. Swelling and bruising can take several days to resolve.  °6. It is common to experience some constipation if taking pain medication after surgery.  Increasing fluid intake and taking a stool softener will usually help or prevent this problem from occurring.  A mild laxative (Milk of Magnesia or Miralax) should be taken according to package directions if there are no bowel movements after 48 hours. °7. Unless discharge instructions indicate otherwise, you may remove your bandages 48 hours after surgery and you may shower at that time.  You may have steri-strips (small skin tapes) in place directly over the incision.   These strips should be left on the skin for 7-10 days and will come off on their own.  If your surgeon used skin glue on the incision, you may shower in 24 hours.  The glue will flake off over the next 2-3 weeks.  Any sutures or staples will be removed at the office during your follow-up visit. °8. ACTIVITIES:  You may resume regular daily activities (gradually increasing) beginning the next day.  Wearing a good support bra or sports bra minimizes pain and swelling.  You may have sexual intercourse when it is comfortable. °a. You may drive when you no longer are taking prescription pain medication, you can comfortably wear a seatbelt, and you can safely maneuver your car and apply brakes. °b. RETURN TO WORK:  ______________________________________________________________________________________ °9. You should see your doctor in the office for a follow-up appointment approximately two weeks after your surgery.  Your doctor’s nurse will typically make your follow-up appointment when she calls you with your pathology report.  Expect your pathology report 3-4 business days after your surgery.  You may call to check if you do not hear from us after three days. °10. OTHER INSTRUCTIONS: _______________________________________________________________________________________________ _____________________________________________________________________________________________________________________________________ °_____________________________________________________________________________________________________________________________________ °_____________________________________________________________________________________________________________________________________ ° °WHEN TO CALL DR WAKEFIELD: °1. Fever over 101.0 °2. Nausea and/or vomiting. °3. Extreme swelling or bruising. °4. Continued bleeding from incision. °5. Increased pain, redness, or drainage from the incision. ° °The clinic staff is available to  answer your questions during regular business hours.  Please don’t hesitate to call and ask to speak to one of the nurses for   clinical concerns.  If you have a medical emergency, go to the nearest emergency room or call 911.  A surgeon from [facility] is always on call at the hospital. ° °For further questions, please visit [email or web address] ° °Post Anesthesia Home Care Instructions ° °Activity: °Get plenty of rest for the remainder of the day. A responsible adult should stay with you for 24 hours following the procedure.  °For the next 24 hours, DO NOT: °-Drive a car °-Operate machinery °-Drink alcoholic beverages °-Take any medication unless instructed by your physician °-Make any legal decisions or sign important papers. ° °Meals: °Start with liquid foods such as gelatin or soup. Progress to regular foods as tolerated. Avoid greasy, spicy, heavy foods. If nausea and/or vomiting occur, drink only clear liquids until the nausea and/or vomiting subsides. Call your physician if vomiting continues. ° °Special Instructions/Symptoms: °Your throat may feel dry or sore from the anesthesia or the breathing tube placed in your throat during surgery. If this causes discomfort, gargle with warm salt water. The discomfort should disappear within 24 hours. ° °

## 2014-11-28 NOTE — H&P (Signed)
73 yof who has history of left mastectomy/sn biopsy by Dr Rebekah Chesterfield in 1997 followed by chemo/tamoxifen for five years. She has no family history. She does have three daughters. She has done well since then. She has no breast complaints or any nipple discharge. She has undergone her usual annual screening mm that showed a right upper inner quadrant mass confirmed by ultrasound to be 30mm in size. Axillary [place] is negative. 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 She is here with her husband and her daughter Marzetta Board (rn at the cancer center) today.   Other Problems Marlowe Kays Goettsch; 11/22/2014 8:47 AM) Arthritis Back Pain Breast Cancer Diverticulosis General anesthesia - complications Lump In Breast Other disease, cancer, significant illness Sleep Apnea Thyroid Disease  Past Surgical History Marlowe Kays Goettsch; 11/22/2014 8:47 AM) Breast Biopsy Bilateral. Breast Mass; Local Excision Left. Cataract Surgery Bilateral. Colon Removal - Partial Hysterectomy (not due to cancer) - Partial Mastectomy Left. Open Inguinal Hernia Surgery Bilateral. Oral Surgery Resection of Small Bowel Sentinel Lymph Node Biopsy Tonsillectomy  Diagnostic Studies History Marlowe Kays Goettsch; 11/22/2014 8:47 AM) Colonoscopy 5-10 years ago Mammogram within last year Pap Smear 1-5 years ago  Social History Marlowe Kays Goettsch; 11/22/2014 8:47 AM) Alcohol use Occasional alcohol use. No caffeine use Tobacco use Never smoker.  Family History Mirian Mo; 11/22/2014 8:47 AM) Diabetes Mellitus Daughter, Father. Heart Disease Father, Mother. Hypertension Daughter, Father. Melanoma Daughter.  Pregnancy / Birth History Mirian Mo; 11/22/2014 8:47 AM) Age at menarche 33 years. Age of menopause <43 Contraceptive History Oral contraceptives. Gravida 3 Irregular periods Maternal age  33-20 Para 3  Review of Systems Mirian Mo; 11/22/2014 8:47 AM) General Not Present- Appetite Loss, Chills, Fatigue, Fever, Night Sweats, Weight Gain and Weight Loss. Skin Present- Dryness. Not Present- Change in Wart/Mole, Hives, Jaundice, New Lesions, Non-Healing Wounds, Rash and Ulcer. HEENT Present- Seasonal Allergies and Wears glasses/contact lenses. Not Present- Earache, Hearing Loss, Hoarseness, Nose Bleed, Oral Ulcers, Ringing in the Ears, Sinus Pain, Sore Throat, Visual Disturbances and Yellow Eyes. Respiratory Present- Snoring. Not Present- Bloody sputum, Chronic Cough, Difficulty Breathing and Wheezing. Breast Present- Breast Mass and Breast Pain. Not Present- Nipple Discharge and Skin Changes. Cardiovascular Not Present- Chest Pain, Difficulty Breathing Lying Down, Leg Cramps, Palpitations, Rapid Heart Rate, Shortness of Breath and Swelling of Extremities. Gastrointestinal Present- Gets full quickly at meals. Not Present- Abdominal Pain, Bloating, Bloody Stool, Change in Bowel Habits, Chronic diarrhea, Constipation, Difficulty Swallowing, Excessive gas, Hemorrhoids, Indigestion, Nausea, Rectal Pain and Vomiting. Female Genitourinary Present- Nocturia and Urgency. Not Present- Frequency, Painful Urination and Pelvic Pain. Musculoskeletal Present- Back Pain. Not Present- Joint Pain, Joint Stiffness, Muscle Pain, Muscle Weakness and Swelling of Extremities. Neurological Not Present- Decreased Memory, Fainting, Headaches, Numbness, Seizures, Tingling, Tremor, Trouble walking and Weakness. Psychiatric Not Present- Anxiety, Bipolar, Change in Sleep Pattern, Depression, Fearful and Frequent crying. Endocrine Present- Hair Changes. Not Present- Cold Intolerance, Excessive Hunger, Heat Intolerance, Hot flashes and New Diabetes. Hematology Not Present- Easy Bruising, Excessive bleeding, Gland problems, HIV and Persistent Infections.   Physical Exam Rolm Bookbinder MD; 11/22/2014 2:40  PM) General Mental Status-Alert. Orientation-Oriented X3.  Chest and Lung Exam Chest and lung exam reveals -on auscultation, normal breath sounds, no adventitious sounds and normal vocal resonance.  Breast Nipples Discharge - Right - None. Breast - Left-Mastectomy scar. Breast - Right-Normal. Breast Lump-No Palpable Breast Mass.   Cardiovascular Cardiovascular examination reveals -normal heart sounds, regular rate  and rhythm with no murmurs.  Lymphatic Head & Neck  General Head & Neck Lymphatics: Bilateral - Description - Normal. Axillary  General Axillary Region: Bilateral - Description - Normal. Note: no [place] adenopathy     Assessment & Plan Rolm Bookbinder MD; 11/22/2014 2:44 PM) CARCINOMA OF BREAST UPPER INNER QUADRANT (174.2  C50.219) Impression: Right breast radioactive seed guided lumpectomy, right axillary sentinel node biopsy  We discussed staging and pathophysiology of her breast cancer. She would like to get genetics evaluation given second breast cancer and with her three daughters. I don't think this will change our plan though. We discussed lumpectomy vs mastectomy. She would like to undergo lumpectomy and I think this is good option. There is no advantage to mastectomy for her. We discussed radioactive seed guidance and possibility of a positive margin necessitating further surgery. We also discussed sentinel node biopsy. She is concerned due to past history of nodes and her activity level is enough that this is reasonable. Will schedule her for next couple weeks. We discussed surgery risks/benefits and recover

## 2014-11-28 NOTE — Transfer of Care (Signed)
Immediate Anesthesia Transfer of Care Note  Patient: Shawna Marquez  Procedure(s) Performed: Procedure(s): RADIOACTIVE SEED GUIDED RIGHT LUMPECTOMY WITH RIGHT AXILLARY SENTINEL LYMPH NODE BIOPSY (Right)  Patient Location: PACU  Anesthesia Type:General  Level of Consciousness: awake and sedated  Airway & Oxygen Therapy: Patient Spontanous Breathing and Patient connected to face mask oxygen  Post-op Assessment: Report given to PACU RN and Post -op Vital signs reviewed and stable  Post vital signs: Reviewed and stable  Complications: No apparent anesthesia complications

## 2014-11-28 NOTE — Interval H&P Note (Signed)
History and Physical Interval Note:  11/28/2014 2:18 PM  Shawna Marquez  has presented today for surgery, with the diagnosis of RIGHT BREAST CANCER  The various methods of treatment have been discussed with the patient and family. After consideration of risks, benefits and other options for treatment, the patient has consented to  Procedure(s): RADIOACTIVE SEED GUIDED RIGHT LUMPECTOMY WITH RIGHT AXILLARY SENTINEL LYMPH NODE BIOPSY (Right) as a surgical intervention .  The patient's history has been reviewed, patient examined, no change in status, stable for surgery.  I have reviewed the patient's chart and labs.  Questions were answered to the patient's satisfaction.     Hattye Siegfried

## 2014-11-28 NOTE — Anesthesia Procedure Notes (Addendum)
Procedure Name: LMA Insertion Performed by: Terrance Mass Pre-anesthesia Checklist: Patient identified, Timeout performed, Emergency Drugs available, Suction available and Patient being monitored Patient Re-evaluated:Patient Re-evaluated prior to inductionOxygen Delivery Method: Circle system utilized Preoxygenation: Pre-oxygenation with 100% oxygen Intubation Type: IV induction Ventilation: Mask ventilation without difficulty LMA: LMA inserted LMA Size: 4.0 Number of attempts: 1 Placement Confirmation: positive ETCO2 Tube secured with: Tape Dental Injury: Teeth and Oropharynx as per pre-operative assessment    Anesthesia Regional Block:  Pectoralis block  Pre-Anesthetic Checklist: ,, timeout performed, Correct Patient, Correct Site, Correct Laterality, Correct Procedure, Correct Position, site marked, Risks and benefits discussed,  Surgical consent,  Pre-op evaluation,  At surgeon's request and post-op pain management  Laterality: Right and Upper  Prep: chloraprep       Needles:  Injection technique: Single-shot  Needle Type: Echogenic Needle     Needle Length: 9cm 9 cm Needle Gauge: 21 and 21 G    Additional Needles:  Procedures: ultrasound guided (picture in chart) Pectoralis block Narrative:  Start time: 11/28/2014 1:44 PM End time: 11/28/2014 1:49 PM Injection made incrementally with aspirations every 5 mL.  Performed by: Personally  Anesthesiologist: [place], [place]

## 2014-11-29 ENCOUNTER — Encounter: Payer: Self-pay | Admitting: Internal Medicine

## 2014-11-29 ENCOUNTER — Other Ambulatory Visit: Payer: Medicare Other

## 2014-11-29 ENCOUNTER — Encounter: Payer: Medicare Other | Admitting: Genetic Counselor

## 2014-11-29 ENCOUNTER — Encounter (HOSPITAL_BASED_OUTPATIENT_CLINIC_OR_DEPARTMENT_OTHER): Payer: Self-pay | Admitting: General Surgery

## 2014-11-29 LAB — POCT HEMOGLOBIN-HEMACUE: HEMOGLOBIN: 15.1 g/dL — AB (ref 12.0–15.0)

## 2014-11-29 NOTE — Addendum Note (Signed)
Addendum  created 11/29/14 0932 by Tawni Millers, CRNA   Modules edited: Charges VN

## 2014-11-30 ENCOUNTER — Encounter: Payer: Self-pay | Admitting: Internal Medicine

## 2014-12-04 ENCOUNTER — Encounter: Payer: Medicare Other | Admitting: Genetic Counselor

## 2014-12-04 ENCOUNTER — Telehealth (INDEPENDENT_AMBULATORY_CARE_PROVIDER_SITE_OTHER): Payer: Self-pay

## 2014-12-04 ENCOUNTER — Other Ambulatory Visit: Payer: Medicare Other

## 2014-12-04 NOTE — Telephone Encounter (Signed)
Patient calling into office requesting her pathology results.  Patient advised that Dr. Donne Hazel has her path report printed with her cell phone number and will call her today to discuss.

## 2014-12-05 ENCOUNTER — Telehealth: Payer: Self-pay | Admitting: *Deleted

## 2014-12-05 NOTE — Telephone Encounter (Signed)
Spoke with patient and rescheduled her appointment with Dr. Lindi Adie for 12/06/14 at 830am.  Confirmed with patient.

## 2014-12-06 ENCOUNTER — Encounter: Payer: Self-pay | Admitting: *Deleted

## 2014-12-06 ENCOUNTER — Ambulatory Visit (HOSPITAL_BASED_OUTPATIENT_CLINIC_OR_DEPARTMENT_OTHER): Payer: Medicare Other | Admitting: Hematology and Oncology

## 2014-12-06 VITALS — BP 147/82 | HR 87 | Temp 97.7°F | Resp 18 | Ht 61.0 in | Wt 175.8 lb

## 2014-12-06 DIAGNOSIS — C773 Secondary and unspecified malignant neoplasm of axilla and upper limb lymph nodes: Secondary | ICD-10-CM

## 2014-12-06 DIAGNOSIS — C50211 Malignant neoplasm of upper-inner quadrant of right female breast: Secondary | ICD-10-CM | POA: Diagnosis not present

## 2014-12-06 DIAGNOSIS — N6489 Other specified disorders of breast: Secondary | ICD-10-CM

## 2014-12-06 NOTE — Assessment & Plan Note (Addendum)
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, focal vascular involvement, posterior margin involvement, 1/2 sentinel node positive, T1 cN1 M0 stage II a  Counseling:I discussed the final pathology with her including the significance of node positive disease. With node-positive disease, she could have potential benefit to doing systemic chemotherapy. But given her advanced age, I would like to send her tissue for mammaprint assay to determine benefit to chemotherapy. We can await the results of mammogram before making any decisions regarding chemotherapy. If she was felt to be high risk, I will offer her Taxotere and Cytoxan every 3 weeks 4. I will asked Dr. Donne Hazel to delay her surgery until we have the mammaprint assay results.  I will inform Dr. Donne Hazel regarding her decision.  Seroma under her axilla: Slightly tender to touch. Instructed her to keep an eye on it. If it significantly swells up or causes more pain she will need to see Dr. Donne Hazel. Patient has appointment to see radiation oncology in genetics coming up.

## 2014-12-06 NOTE — Progress Notes (Signed)
Patient Care Team: Marletta Lor, MD as PCP - General Rolm Bookbinder, MD as Consulting Physician (General Surgery) Rulon Eisenmenger, MD as Consulting Physician (Hematology and Oncology) Thea Silversmith, MD as Consulting Physician (Radiation Oncology) Trinda Pascal, NP as Nurse Practitioner (Nurse Practitioner)  DIAGNOSIS: 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 breast   Staging form: Breast, AJCC 7th Edition     Clinical stage from 11/22/2014: Stage IA (T1b, N0, M0) - Unsigned   SUMMARY OF ONCOLOGIC HISTORY:   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 breast   06/07/1996 Initial Biopsy Left breast cancer treated with lumpectomy and chemotherapy with Adriamycin and Cytoxan followed by radiation therapy.    Mammogram 9 mm mass at 1:00 position   11/15/2014 Initial Biopsy Right breast needle biopsy: Grade 2 invasive ductal carcinoma with DCIS with lymphovascular invasion ER/PR positive HER-2 negative Ki-67 20%   11/28/2014 Surgery right breast lumpectomy: 1.7 cm invasive ductal carcinoma with DCIS, focal vascular involvement, posterior margin involvement, 1/2 sentinel node positive    CHIEF COMPLIANT: Follow-up after surgery  INTERVAL HISTORY: Shawna Marquez is a 73 year old lady with a history of recurrent right breast cancer who is here for a follow-up after recent lumpectomy surgery. She is recovered fairly well from surgery apart from slight swelling at the site of her lymph node removal area it appears that she developed a seroma there.  REVIEW OF SYSTEMS:   Constitutional: Denies fevers, chills or abnormal weight loss Eyes: Denies blurriness of vision Ears, nose, mouth, throat, and face: Denies mucositis or sore throat Respiratory: Denies cough, dyspnea or wheezes Cardiovascular: Denies palpitation, chest discomfort or lower extremity swelling Gastrointestinal:  Denies nausea, heartburn or change in bowel habits Skin: Denies abnormal skin  rashes Lymphatics: Denies new lymphadenopathy or easy bruising Neurological:Denies numbness, tingling or new weaknesses Behavioral/Psych: Mood is stable, no new changes  Breast: seroma axillary area on the right All other systems were reviewed with the patient and are negative.  I have reviewed the past medical history, past surgical history, social history and family history with the patient and they are unchanged from previous note.  ALLERGIES:  has No Known Allergies.  MEDICATIONS:  Current Outpatient Prescriptions  Medication Sig Dispense Refill  . aspirin 81 MG tablet Take 81 mg by mouth daily.      . calcium carbonate 200 MG capsule Take 250 mg by mouth 2 (two) times daily with a meal.    . co-enzyme Q-10 30 MG capsule Take 100 mg by mouth 2 (two) times daily.    . fluticasone (FLONASE) 50 MCG/ACT nasal spray USE 1 SPRAY IN EACH NOSTRIL TWICE DAILY 48 g 5  . folic acid (FOLVITE) 834 MCG tablet Take 400 mcg by mouth daily.    Marland Kitchen levothyroxine (SYNTHROID, LEVOTHROID) 125 MCG tablet TAKE 1 TABLET BY MOUTH DAILY 90 tablet 3  . Multiple Vitamin (MULTIVITAMIN) tablet Take 1 tablet by mouth daily.    . Omega-3 Fatty Acids (OMEGA 3 PO) Take by mouth.    . oxyCODONE-acetaminophen (PERCOCET) 10-325 MG per tablet Take 1 tablet by mouth every 6 (six) hours as needed for pain. 20 tablet 0  . Turmeric 450 MG CAPS Take by mouth.    . vitamin C (ASCORBIC ACID) 500 MG tablet Take 500 mg by mouth daily.     No current facility-administered medications for this visit.    PHYSICAL EXAMINATION: ECOG PERFORMANCE STATUS: 1 - Symptomatic but completely ambulatory  Filed Vitals:  12/06/14 0824  BP: 147/82  Pulse: 87  Temp: 97.7 F (36.5 C)  Resp: 18   Filed Weights   12/06/14 0824  Weight: 175 lb 12.8 oz (79.742 kg)    GENERAL:alert, no distress and comfortable SKIN: skin color, texture, turgor are normal, no rashes or significant lesions EYES: normal, Conjunctiva are pink and  non-injected, sclera clear OROPHARYNX:no exudate, no erythema and lips, buccal mucosa, and tongue normal  NECK: supple, thyroid normal size, non-tender, without nodularity LYMPH:  no palpable lymphadenopathy in the cervical, axillary or inguinal LUNGS: clear to auscultation and percussion with normal breathing effort HEART: regular rate & rhythm and no murmurs and no lower extremity edema ABDOMEN:abdomen soft, non-tender and normal bowel sounds Musculoskeletal:no cyanosis of digits and no clubbing  NEURO: alert & oriented x 3 with fluent speech, no focal motor/sensory deficits BREAST:seroma underneath the right axilla. No palpable axillary supraclavicular or infraclavicular adenopathy no breast tenderness or nipple discharge.   LABORATORY DATA:  I have reviewed the data as listed   Chemistry      Component Value Date/Time   NA 142 11/22/2014 0826   NA 139 02/24/2014 1013   K 4.3 11/22/2014 0826   K 4.1 02/24/2014 1013   CL 104 02/24/2014 1013   CO2 29 11/22/2014 0826   CO2 28 02/24/2014 1013   BUN 16.3 11/22/2014 0826   BUN 11 02/24/2014 1013   CREATININE 0.8 11/22/2014 0826   CREATININE 0.7 02/24/2014 1013      Component Value Date/Time   CALCIUM 9.3 11/22/2014 0826   CALCIUM 9.4 02/24/2014 1013   ALKPHOS 87 11/22/2014 0826   ALKPHOS 67 02/24/2014 1013   AST 24 11/22/2014 0826   AST 33 02/24/2014 1013   ALT 29 11/22/2014 0826   ALT 31 02/24/2014 1013   BILITOT 0.32 11/22/2014 0826   BILITOT 0.7 02/24/2014 1013       Lab Results  Component Value Date   WBC 6.0 11/22/2014   HGB 15.1* 11/28/2014   HCT 42.6 11/22/2014   MCV 94.6 11/22/2014   PLT 223 11/22/2014   NEUTROABS 3.1 11/22/2014    ASSESSMENT & PLAN:  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 breast 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, focal vascular involvement, posterior margin involvement, 1/2 sentinel node positive, T1 cN1 M0 stage II a  Counseling:I discussed the  final pathology with her including the significance of node positive disease. With node-positive disease, she could have potential benefit to doing systemic chemotherapy. But given her advanced age, I would like to send her tissue for mammaprint assay to determine benefit to chemotherapy. We can await the results of mammogram before making any decisions regarding chemotherapy. If she was felt to be high risk, I will offer her Taxotere and Cytoxan every 3 weeks 4. I will asked Dr. Dwain Sarna to delay her surgery until we have the mammaprint assay results.  I will inform Dr. Dwain Sarna regarding her decision.  Seroma under her axilla: Slightly tender to touch. Instructed her to keep an eye on it. If it significantly swells up or causes more pain she will need to see Dr. Dwain Sarna. Patient has appointment to see radiation oncology in genetics coming up.   No orders of the defined types were placed in this encounter.   The patient has a good understanding of the overall plan. she agrees with it. She will call with any problems that may develop before her next visit here.   Serena Croissant  K, MD 12/06/2014 8:56 AM

## 2014-12-06 NOTE — Progress Notes (Signed)
Ordered mammoprint per Dr. Lindi Adie.  Faxed requisition to [facility] and informed Christy with Pathology.

## 2014-12-06 NOTE — Progress Notes (Signed)
MD note created during office visit.  Copy to patient.  Original to scan.

## 2014-12-06 NOTE — Addendum Note (Signed)
Addended by: Prentiss Bells on: 12/06/2014 09:09 AM   Modules accepted: Medications

## 2014-12-07 NOTE — Progress Notes (Addendum)
Location of Breast Cancer:right breast  Upper outer quadrant 1.7 cm invasive ductal carcinoma with DCIS, grade II  Histology per Pathology Report:11/15/14 FINAL DIAGNOSIS Diagnosis Breast, right, needle core biopsy, mass - INVASIVE DUCTAL CARCINOMA, SEE COMMENT. - DUCTAL CARCINOMA IN SITU. - POSITIVE FOR LYMPH-VASCULAR INVASION.  Left breast biopsy 06/07/1996 treated with lumpectomy, chemotherapy and radiation.  Receptor Status: ER(+), PR (+), Her2-neu (-)  Did patient present with symptoms (if so, please note symptoms) or was this found on screening mammography?:Found on screening mammography.  Past/Anticipated interventions by surgeon, if any:11/28/14 RADIOACTIVE SEED GUIDED PARTIAL MASTECTOMY WITH AXILLARY SENTINEL LYMPH NODE BIOPSY  Past/Anticipated interventions by medical oncology, if any: Chemotherapy recommendation of taxotere and cytoxan every 3 weeks x 4 Took tamoxifen x 5 years after treatment in 1997.  Lymphedema issues, if any:  No  Pain issues, if any:No  SAFETY ISSUES:  Prior radiation? No  Pacemaker/ICD? No  Possible current pregnancy? No. TAH still has ovaries.  Is the patient on methotrexate?No    Current Complaints / other details:GXP3, first birth age 73, menarche age 56.Took estrogen approximately 1.5 years. History of left breast cancer (mastectomy)19 years ago in 1997. NKDA No smoking history but does drink beer occasionally.    Arlyss Repress, RN 12/07/2014,3:17 PM

## 2014-12-12 ENCOUNTER — Ambulatory Visit: Payer: Medicare Other | Admitting: Hematology and Oncology

## 2014-12-13 ENCOUNTER — Encounter: Payer: Self-pay | Admitting: Genetic Counselor

## 2014-12-13 ENCOUNTER — Ambulatory Visit
Admission: RE | Admit: 2014-12-13 | Discharge: 2014-12-13 | Disposition: A | Discharge status: Home / Self Care | Payer: Medicare Other | Source: Ambulatory Visit | Attending: Radiation Oncology | Admitting: Radiation Oncology

## 2014-12-13 ENCOUNTER — Ambulatory Visit (HOSPITAL_BASED_OUTPATIENT_CLINIC_OR_DEPARTMENT_OTHER): Payer: Medicare Other | Admitting: Genetic Counselor

## 2014-12-13 ENCOUNTER — Other Ambulatory Visit: Payer: Medicare Other

## 2014-12-13 ENCOUNTER — Encounter: Payer: Self-pay | Admitting: Radiation Oncology

## 2014-12-13 VITALS — BP 114/57 | HR 98 | Temp 99.8°F | Wt 117.4 lb

## 2014-12-13 DIAGNOSIS — C50911 Malignant neoplasm of unspecified site of right female breast: Secondary | ICD-10-CM | POA: Insufficient documentation

## 2014-12-13 DIAGNOSIS — Z170 Estrogen receptor positive status [ER+]: Secondary | ICD-10-CM | POA: Diagnosis not present

## 2014-12-13 DIAGNOSIS — Z801 Family history of malignant neoplasm of trachea, bronchus and lung: Secondary | ICD-10-CM | POA: Diagnosis not present

## 2014-12-13 DIAGNOSIS — Z315 Encounter for genetic counseling: Secondary | ICD-10-CM | POA: Diagnosis not present

## 2014-12-13 DIAGNOSIS — C50211 Malignant neoplasm of upper-inner quadrant of right female breast: Secondary | ICD-10-CM

## 2014-12-13 DIAGNOSIS — Z803 Family history of malignant neoplasm of breast: Secondary | ICD-10-CM | POA: Diagnosis not present

## 2014-12-13 NOTE — Progress Notes (Signed)
Please see the Nurse Progress Note in the MD Initial Consult Encounter for this patient. 

## 2014-12-13 NOTE — Progress Notes (Addendum)
   Department of Radiation Oncology  Phone:  [phone] Fax:        [phone]   Name: Shawna Marquez MRN: [id number]  DOB: 10/22/1941  Date: 12/13/2014  Follow Up Visit Note  Diagnosis: T1cN1 Invasive Ductal Carcinoma of the Right Breast   Interval History: Shawna Marquez presents today for routine followup.  She had her lumpectomy and SLNBx on 12/22. This showed a 1.7 cm tumor with ductal carcinoma in situ, vascular involvement and a positive focal margin posteriorly. One lymph node out of 2 had a 3 mm deposit of tumor. ER and PR were positive and HER2 was negative. Due to the positive lymph node, a mammaprint has been sent and we are awaiting those results. She is waiting on re-excision of this margin until she knows whether she will need a port at the same time for chemotherapy. She has healed well from her surgery. She is accompanied by her daughter today. She has a genetics counseling appointment after this.   Physical Exam:  Filed Vitals:   12/13/14 0917  BP: 114/57  Pulse: 98  Temp: 99.8 F (37.7 C)  Weight: 117 lb 6.4 oz (53.252 kg)   Pleasant female. No distress  IMPRESSION: Shawna Marquez is a 74 y.o. female s/p breast conservation with a 3 mm positive lymph node and focally positive margin, awaiting chemotherapy decision.   PLAN:  I spoke to the patient today regarding her diagnosis and options for treatment. . We discussed the role of radiation in decreasing local failures in patients who undergo lumpectomy. We discussed the process of simulation and the placement tattoos. We discussed 4-6 weeks of treatment as an outpatient. We discussed the possibility of asymptomatic lung damage. We discussed the low likelihood of secondary malignancies. We discussed the possible side effects including but not limited to skin redness, fatigue, permanent skin darkening, and breast swelling. . I will see her back after her mammaprint and re-excision. I did clarify with her that if she needed chemotherapy  this would be performed prior to radiation.   We discussed treatment of the whole breast with "high" tangents to cover the axilla. We discussed a boost as well.   She can be scheduled for simulation next with no follow up with me. I asked her to schedule this 2-3 weeks after her surgery to ensure she has adequate healing before starting.   Thea Silversmith, MD

## 2014-12-13 NOTE — Progress Notes (Signed)
REFERRING PROVIDER: Marletta Lor, MD [place], [place] [address]   Nicholas Lose, MD  PRIMARY PROVIDER:  Nyoka Cowden, MD  PRIMARY REASON FOR VISIT:  1. Cancer of upper-inner quadrant of female breast, right   2. Family history of breast cancer      HISTORY OF PRESENT ILLNESS:   Shawna Marquez, a 74 y.o. female, was seen for a [facility] cancer genetics consultation at the request of Dr. Lindi Adie due to a personal and family history of cancer.  Shawna Marquez presents to clinic today to discuss the possibility of a hereditary predisposition to cancer, genetic testing, and to further clarify her future cancer risks, as well as potential cancer risks for family members.   CANCER HISTORY:    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 breast   06/07/1996 Initial Biopsy Left breast cancer treated with mastectomy and chemotherapy with Adriamycin and Cytoxan.    Mammogram 9 mm mass at 1:00 position   11/15/2014 Initial Biopsy Right breast needle biopsy: Grade 2 invasive ductal carcinoma with DCIS with lymphovascular invasion ER/PR positive HER-2 negative Ki-67 20%   11/28/2014 Surgery right breast lumpectomy: 1.7 cm invasive ductal carcinoma with DCIS, focal vascular involvement, posterior margin involvement, 1/2 sentinel node positive     HISTORY OF PRESENT ILLNESS: In December 2015, at the age of 4, Shawna Marquez was diagnosed with invasive ductal carcinoma and DCIS of the right breast. The tumor is ER+/PR+/Her2-. She is waiting on her mammaprint result to learn how they may treat this.  Shawna Marquez was diagnosed with breast cancer in 1997 in her left breast.  This was treated with left mastectomy and chemotherapy.   The tumor was ER+ as well.  HORMONAL RISK FACTORS:  Menarche was at age 14.  First live birth at age 77.  OCP use for approximately 1 years.  Ovaries intact: yes.  Hysterectomy: yes.  Menopausal status: postmenopausal.  HRT use: 0  years. Colonoscopy: yes; normal. Mammogram within the last year: yes. Number of breast biopsies: 2. Up to date with pelvic exams:  yes. Any excessive radiation exposure in the past:  no  Past Medical History  Diagnosis Date  . ALLERGIC RHINITIS 10/12/2007  . BREAST CANCER, HX OF 10/12/2007  . DIVERTICULITIS, HX OF 10/12/2007  . HYPOTHYROIDISM, PRIMARY 10/12/2007  . OSTEOARTHRITIS 09/09/2007  . 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 Sleep apnea   . Complication of anesthesia     bp will drop,hard to wake up  . Wears glasses   . Family history of breast cancer     Past Surgical History  Procedure Laterality Date  . Colovaginal fistula  2007    colon resection  . Abdominal hysterectomy  1977  . Hernia repair  2007    ventral  . Colon surgery  2006    part colectomy  . Mastectomy  1997    lt mast-axillary node dissection  . Tonsillectomy    . Colonoscopy  2007  . Wisdom tooth extraction    . Eye surgery      both cataracts  . Radioactive seed guided mastectomy with axillary sentinel lymph node biopsy Right 11/28/2014    Procedure: RADIOACTIVE SEED GUIDED RIGHT LUMPECTOMY WITH RIGHT AXILLARY SENTINEL LYMPH NODE BIOPSY;  Surgeon: Rolm Bookbinder, MD;  Location: [facility];  Service: General;  Laterality: Right;    History   Social History  . Marital Status: Married    Spouse Name: N/A  Number of Children: 3  . Years of Education: N/A   Social History Main Topics  . Smoking status: Never Smoker   . Smokeless tobacco: Never Used  . Alcohol Use: 0.0 oz/week    0 Not specified per week     Comment: occassional beer  . Drug Use: No  . Sexual Activity: None   Other Topics Concern  . None   Social History Narrative     FAMILY HISTORY:  We obtained a detailed, 4-generation family history.  Significant diagnoses are listed below: Family History  Problem Relation Age of Onset  . Heart disease Neg Hx     family hx  . Cancer  Neg Hx     lung ca  . COPD Neg Hx     family hx  . Breast cancer Paternal Grandmother 28  . Lung cancer Father 56    smoker  . Prostate cancer Cousin     paternal cousin    Shawna Marquez is an only child.  Her mother was one of four siblings and her father was the middle child of 8 children who lived (2 additional siblings died in infancy).  None of her aunts or uncles had cancer, and only one paternal cousin had prostate cancer.  Shawna Marquez had a paternal grandmother with postmenopausal breast cancer.  Patient's maternal ancestors are of [place] descent, and paternal ancestors are of [place] and [place] descent. There is no reported Ashkenazi Jewish ancestry. There is no known consanguinity.  GENETIC COUNSELING ASSESSMENT: Shawna Marquez is a 74 y.o. female with a personal and family history of cancer which somewhat suggestive of a sporadic or possibly familial cancer. We, therefore, discussed and recommended the following at today's visit.   DISCUSSION: We discussed with Shawna Marquez that the family history is not highly consistent with a familial hereditary cancer syndrome, and we feel she is at low risk to harbor a gene mutation associated with such a condition. Thus, we did not recommend any genetic testing, at this time, and recommended Shawna Marquez continue to follow the cancer screening guidelines given by her primary healthcare provider.  PLAN: After considering the risks, benefits, and limitations,Shawna Marquez declined testing at this time.   We encouraged Shawna Marquez. Shawna Marquez to remain in contact with cancer genetics annually so that we can continuously update the family history and inform her of any changes in cancer genetics and testing that may be of benefit for this family.   Shawna Marquez.  Shawna Marquez questions were answered to her satisfaction today. Our contact information was provided should additional questions or concerns arise. Thank you for the referral and allowing [place] to share in the care of your patient.   Montrell Cessna P.  Florene Glen, [place], [facility] Certified Genetic Counselor [email or web address] phone: [phone]  The patient was seen for a total of 30 minutes in face-to-face genetic counseling.  This patient was discussed with Drs. Magrinat, Lindi Adie and/or Burr Medico who agrees with the above.    _______________________________________________________________________ For Office Staff:  Number of people involved in session: 1 Was an Intern/ student involved with case: no

## 2014-12-13 NOTE — Addendum Note (Signed)
Encounter addended by: Thea Silversmith, MD on: 12/13/2014 10:25 AM<BR>     Documentation filed: Clinical Notes, Notes Section

## 2014-12-21 ENCOUNTER — Encounter (HOSPITAL_COMMUNITY): Payer: Self-pay

## 2014-12-22 ENCOUNTER — Other Ambulatory Visit: Payer: Self-pay

## 2014-12-22 ENCOUNTER — Other Ambulatory Visit (INDEPENDENT_AMBULATORY_CARE_PROVIDER_SITE_OTHER): Payer: Self-pay | Admitting: General Surgery

## 2014-12-22 ENCOUNTER — Telehealth: Payer: Self-pay | Admitting: Hematology and Oncology

## 2014-12-22 NOTE — Telephone Encounter (Signed)
, °

## 2014-12-25 ENCOUNTER — Ambulatory Visit (HOSPITAL_BASED_OUTPATIENT_CLINIC_OR_DEPARTMENT_OTHER): Payer: Medicare Other | Admitting: Hematology and Oncology

## 2014-12-25 ENCOUNTER — Telehealth: Payer: Self-pay | Admitting: *Deleted

## 2014-12-25 ENCOUNTER — Telehealth: Payer: Self-pay | Admitting: Hematology and Oncology

## 2014-12-25 ENCOUNTER — Encounter: Payer: Self-pay | Admitting: *Deleted

## 2014-12-25 VITALS — BP 122/79 | HR 81 | Temp 98.0°F | Resp 18 | Ht 61.0 in | Wt 174.6 lb

## 2014-12-25 DIAGNOSIS — C50211 Malignant neoplasm of upper-inner quadrant of right female breast: Secondary | ICD-10-CM | POA: Diagnosis not present

## 2014-12-25 NOTE — Telephone Encounter (Signed)
Per staff message and POF I have scheduled appts. Advised scheduler of appts. JMW  

## 2014-12-25 NOTE — Progress Notes (Signed)
Patient Care Team: Marletta Lor, MD as PCP - General Rolm Bookbinder, MD as Consulting Physician (General Surgery) Rulon Eisenmenger, MD as Consulting Physician (Hematology and Oncology) Thea Silversmith, MD as Consulting Physician (Radiation Oncology) Trinda Pascal, NP as Nurse Practitioner (Nurse Practitioner)  DIAGNOSIS: 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 breast   Staging form: Breast, AJCC 7th Edition     Clinical stage from 11/22/2014: Stage IA (T1b, N0, M0) - Unsigned   SUMMARY OF ONCOLOGIC HISTORY:   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 breast   06/07/1996 Initial Biopsy Left breast cancer treated with mastectomy and chemotherapy with Adriamycin and Cytoxan.    Mammogram 9 mm mass at 1:00 position   11/15/2014 Initial Biopsy Right breast needle biopsy: Grade 2 invasive ductal carcinoma with DCIS with lymphovascular invasion ER/PR positive HER-2 negative Ki-67 20%   11/28/2014 Surgery right breast lumpectomy: 1.7 cm invasive ductal carcinoma with DCIS, focal vascular involvement, posterior margin involvement, 1/2 sentinel node positive, Mammaprint Luminal type, High Risk    CHIEF COMPLIANT: Follow-up after mammaprint result  INTERVAL HISTORY: Shawna Marquez is a 74 year old lady with above-mentioned history of left-sided breast cancer treated with mastectomy in 1997 presents with a right-sided breast cancer treated with lumpectomy and was found to have 1 positive lymph node. She had Mammaprint testing which came back as high risk. She is here today accompanied by her family to discuss treatment plan. She has healed very well from the surgery and is without any new concerns or complaints. The seroma that she had under the arm is also improved.  REVIEW OF SYSTEMS:   Constitutional: Denies fevers, chills or abnormal weight loss Eyes: Denies blurriness of vision Ears, nose, mouth, throat, and face: Denies mucositis or sore  throat Respiratory: Denies cough, dyspnea or wheezes Cardiovascular: Denies palpitation, chest discomfort or lower extremity swelling Gastrointestinal:  Denies nausea, heartburn or change in bowel habits Skin: Denies abnormal skin rashes Lymphatics: Denies new lymphadenopathy or easy bruising Neurological:Denies numbness, tingling or new weaknesses Behavioral/Psych: Mood is stable, no new changes  All other systems were reviewed with the patient and are negative.  I have reviewed the past medical history, past surgical history, social history and family history with the patient and they are unchanged from previous note.  ALLERGIES:  has No Known Allergies.  MEDICATIONS:  Current Outpatient Prescriptions  Medication Sig Dispense Refill  . aspirin 81 MG tablet Take 81 mg by mouth daily.      . calcium carbonate 200 MG capsule Take 250 mg by mouth 2 (two) times daily with a meal.    . cholecalciferol (VITAMIN D) 1000 UNITS tablet Take 1,000 Units by mouth daily. 3000 units    . co-enzyme Q-10 30 MG capsule Take 100 mg by mouth 2 (two) times daily.    . fluticasone (FLONASE) 50 MCG/ACT nasal spray USE 1 SPRAY IN EACH NOSTRIL TWICE DAILY 48 g 5  . folic acid (FOLVITE) 295 MCG tablet Take 400 mcg by mouth daily.    Marland Kitchen levothyroxine (SYNTHROID, LEVOTHROID) 125 MCG tablet TAKE 1 TABLET BY MOUTH DAILY 90 tablet 3  . Multiple Vitamin (MULTIVITAMIN) tablet Take 1 tablet by mouth daily.    . Omega-3 Fatty Acids (OMEGA 3 PO) Take by mouth.    . oxyCODONE-acetaminophen (PERCOCET) 10-325 MG per tablet Take 1 tablet by mouth every 6 (six) hours as needed for pain. 20 tablet 0  . Turmeric 450 MG CAPS Take by mouth.    Marland Kitchen  vitamin C (ASCORBIC ACID) 500 MG tablet Take 500 mg by mouth daily.     No current facility-administered medications for this visit.    PHYSICAL EXAMINATION: ECOG PERFORMANCE STATUS: 0 - Asymptomatic  Filed Vitals:   12/25/14 0820  BP: 122/79  Pulse: 81  Temp: 98 F (36.7 C)   Resp: 18   Filed Weights   12/25/14 0820  Weight: 174 lb 9.6 oz (79.198 kg)    GENERAL:alert, no distress and comfortable SKIN: skin color, texture, turgor are normal, no rashes or significant lesions EYES: normal, Conjunctiva are pink and non-injected, sclera clear OROPHARYNX:no exudate, no erythema and lips, buccal mucosa, and tongue normal  NECK: supple, thyroid normal size, non-tender, without nodularity LYMPH:  no palpable lymphadenopathy in the cervical, axillary or inguinal LUNGS: clear to auscultation and percussion with normal breathing effort HEART: regular rate & rhythm and no murmurs and no lower extremity edema ABDOMEN:abdomen soft, non-tender and normal bowel sounds Musculoskeletal:no cyanosis of digits and no clubbing  NEURO: alert & oriented x 3 with fluent speech, no focal motor/sensory deficits  LABORATORY DATA:  I have reviewed the data as listed   Chemistry      Component Value Date/Time   NA 142 11/22/2014 0826   NA 139 02/24/2014 1013   K 4.3 11/22/2014 0826   K 4.1 02/24/2014 1013   CL 104 02/24/2014 1013   CO2 29 11/22/2014 0826   CO2 28 02/24/2014 1013   BUN 16.3 11/22/2014 0826   BUN 11 02/24/2014 1013   CREATININE 0.8 11/22/2014 0826   CREATININE 0.7 02/24/2014 1013      Component Value Date/Time   CALCIUM 9.3 11/22/2014 0826   CALCIUM 9.4 02/24/2014 1013   ALKPHOS 87 11/22/2014 0826   ALKPHOS 67 02/24/2014 1013   AST 24 11/22/2014 0826   AST 33 02/24/2014 1013   ALT 29 11/22/2014 0826   ALT 31 02/24/2014 1013   BILITOT 0.32 11/22/2014 0826   BILITOT 0.7 02/24/2014 1013       Lab Results  Component Value Date   WBC 6.0 11/22/2014   HGB 15.1* 11/28/2014   HCT 42.6 11/22/2014   MCV 94.6 11/22/2014   PLT 223 11/22/2014   NEUTROABS 3.1 11/22/2014   ASSESSMENT & PLAN:  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 breast 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, focal vascular involvement, posterior margin  involvement, 1/2 sentinel node positive, T1 cN1 M0 stage II a, minimal present luminal type B, high risk, disease free survival with chemotherapy and hormonal therapy 88% versus 76% with hormonal therapy alone  Recommendation: Adjuvant chemotherapy with Taxotere and Cytoxan every 3 weeks 4 cycles  Chemotherapy counseling: I discussed the risks and benefits of chemotherapy including the risk of hair loss, nausea vomiting, fevers, infection risk, rash, neuropathy. Patient has consented to proceed with treatment. I discussed with her that after reading resection margins she might need a few days to heal and recover. I will discuss with Dr. Donne Hazel to determine how long she needs to wait after surgery to start chemotherapy.  Plan: 1. Poor IV access: Will need a port 2. Patient will need reexcision surgery for positive margin 3. Chemotherapy education 4. Plan to start chemotherapy in about 3 weeks No orders of the defined types were placed in this encounter.   The patient has a good understanding of the overall plan. she agrees with it. She will call with any problems that may develop before her next visit  here.   Rulon Eisenmenger, MD

## 2014-12-25 NOTE — Progress Notes (Signed)
Received mammaprint from [place]. Sent to scan.

## 2014-12-25 NOTE — Assessment & Plan Note (Signed)
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, focal vascular involvement, posterior margin involvement, 1/2 sentinel node positive, T1 cN1 M0 stage II a, minimal present luminal type B, high risk, disease free survival with chemotherapy and hormonal therapy 88% versus 76% with hormonal therapy alone  Recommendation: Adjuvant chemotherapy with Taxotere and Cytoxan every 3 weeks 4 cycles  Chemotherapy counseling: I discussed the risks and benefits of chemotherapy including the risk of hair loss, nausea vomiting, fevers, infection risk, and a PDS, rash, neuropathy. Patient is status post recent benefits and has consented to proceed with treatment  Plan: 1. Poor IV access: Will need a port 2. Patient will need reexcision surgery for positive margin 3. Chemotherapy education 4. Plan to start chemotherapy in about 3 weeks

## 2014-12-25 NOTE — Progress Notes (Signed)
Note created by Dr. Gudena during office visit. Copy to patient, original to scan. 

## 2014-12-25 NOTE — Telephone Encounter (Signed)
, °

## 2014-12-26 ENCOUNTER — Other Ambulatory Visit: Payer: Medicare Other

## 2014-12-26 ENCOUNTER — Encounter: Payer: Self-pay | Admitting: *Deleted

## 2014-12-27 ENCOUNTER — Other Ambulatory Visit: Payer: Self-pay

## 2014-12-27 DIAGNOSIS — C50211 Malignant neoplasm of upper-inner quadrant of right female breast: Secondary | ICD-10-CM

## 2014-12-27 MED ORDER — DEXAMETHASONE 4 MG PO TABS: 4.0000 mg | ORAL_TABLET | Freq: Two times a day (BID) | ORAL | Status: DC

## 2014-12-27 MED ORDER — PROCHLORPERAZINE MALEATE 10 MG PO TABS: 10.0000 mg | ORAL_TABLET | Freq: Four times a day (QID) | ORAL | Status: DC | PRN

## 2014-12-27 MED ORDER — ONDANSETRON HCL 8 MG PO TABS: 8.0000 mg | ORAL_TABLET | Freq: Two times a day (BID) | ORAL | Status: DC

## 2014-12-27 MED ORDER — LIDOCAINE-PRILOCAINE 2.5-2.5 % EX CREA: TOPICAL_CREAM | CUTANEOUS | Status: DC

## 2014-12-27 MED ORDER — LORAZEPAM 0.5 MG PO TABS: 0.5000 mg | ORAL_TABLET | Freq: Four times a day (QID) | ORAL | Status: DC | PRN

## 2014-12-27 NOTE — Progress Notes (Signed)
Ativan prescription called in to [facility].  All other pre-treatment meds released and escribed - receipt confirmed.

## 2014-12-28 ENCOUNTER — Encounter (HOSPITAL_COMMUNITY): Payer: Self-pay

## 2014-12-28 ENCOUNTER — Encounter (HOSPITAL_COMMUNITY)
Admission: RE | Admit: 2014-12-28 | Discharge: 2014-12-28 | Disposition: A | Discharge status: Home / Self Care | Payer: Medicare Other | Source: Ambulatory Visit | Attending: General Surgery | Admitting: General Surgery

## 2014-12-28 DIAGNOSIS — C50911 Malignant neoplasm of unspecified site of right female breast: Secondary | ICD-10-CM | POA: Diagnosis not present

## 2014-12-28 DIAGNOSIS — Z6832 Body mass index (BMI) 32.0-32.9, adult: Secondary | ICD-10-CM | POA: Diagnosis not present

## 2014-12-28 DIAGNOSIS — M1990 Unspecified osteoarthritis, unspecified site: Secondary | ICD-10-CM | POA: Diagnosis not present

## 2014-12-28 DIAGNOSIS — Z9221 Personal history of antineoplastic chemotherapy: Secondary | ICD-10-CM | POA: Diagnosis not present

## 2014-12-28 DIAGNOSIS — E039 Hypothyroidism, unspecified: Secondary | ICD-10-CM | POA: Diagnosis not present

## 2014-12-28 DIAGNOSIS — G4730 Sleep apnea, unspecified: Secondary | ICD-10-CM | POA: Diagnosis not present

## 2014-12-28 LAB — CBC WITH DIFFERENTIAL/PLATELET
BASOS PCT: 1 % (ref 0–1)
Basophils Absolute: 0 10*3/uL (ref 0.0–0.1)
EOS ABS: 0.3 10*3/uL (ref 0.0–0.7)
Eosinophils Relative: 5 % (ref 0–5)
HCT: 40.8 % (ref 36.0–46.0)
Hemoglobin: 13.1 g/dL (ref 12.0–15.0)
Lymphocytes Relative: 29 % (ref 12–46)
Lymphs Abs: 2.2 10*3/uL (ref 0.7–4.0)
MCH: 30.9 pg (ref 26.0–34.0)
MCHC: 32.1 g/dL (ref 30.0–36.0)
MCV: 96.2 fL (ref 78.0–100.0)
Monocytes Absolute: 0.4 10*3/uL (ref 0.1–1.0)
Monocytes Relative: 6 % (ref 3–12)
NEUTROS PCT: 59 % (ref 43–77)
Neutro Abs: 4.5 10*3/uL (ref 1.7–7.7)
Platelets: 341 10*3/uL (ref 150–400)
RBC: 4.24 MIL/uL (ref 3.87–5.11)
RDW: 12.6 % (ref 11.5–15.5)
WBC: 7.5 10*3/uL (ref 4.0–10.5)

## 2014-12-28 LAB — BASIC METABOLIC PANEL
Anion gap: 8 (ref 5–15)
BUN: 11 mg/dL (ref 6–23)
CALCIUM: 9.3 mg/dL (ref 8.4–10.5)
CO2: 30 mmol/L (ref 19–32)
Chloride: 104 mEq/L (ref 96–112)
Creatinine, Ser: 0.76 mg/dL (ref 0.50–1.10)
GFR, EST NON AFRICAN AMERICAN: 80 mL/min — AB (ref >90)
GLUCOSE: 102 mg/dL — AB (ref 70–99)
Potassium: 4.1 mmol/L (ref 3.5–5.1)
Sodium: 142 mmol/L (ref 135–145)

## 2014-12-28 NOTE — Progress Notes (Signed)
PT. STOPPED ( tues)ASA IN PREPARATION  OF SURGERY & SHE IS AWARE TO STAY OFF

## 2014-12-28 NOTE — Pre-Procedure Instructions (Signed)
Shawna Marquez  12/28/2014   Your procedure is scheduled on:  01/01/2015  Report to [facility] Admitting    ENTRANCE  A at 9:30 AM.  Call this number if you have problems the morning of surgery: [phone]   Remember:   Do not eat food or drink liquids after midnight. On Sunday  Take these medicines the morning of surgery with A SIP OF WATER: thyroid medicine    Do not wear jewelry, make-up or nail polish.   Do not wear lotions, powders, or perfumes. You may wear deodorant.   Do not shave 48 hours prior to surgery.   Do not bring valuables to the hospital.  [facility] is not responsible   for any belongings or valuables.               Contacts, dentures or bridgework may not be worn into surgery.   Leave suitcase in the car. After surgery it may be brought to your room.   For patients admitted to the hospital, discharge time is determined by your  treatment team.               Patients discharged the day of surgery will not be allowed to drive  home.  Name and phone number of your driver: /w with family  Special Instructions: Special Instructions: [facility] - Preparing for Surgery  Before surgery, you can play an important role.  Because skin is not sterile, your skin needs to be as free of germs as possible.  You can reduce the number of germs on you skin by washing with CHG (chlorahexidine gluconate) soap before surgery.  CHG is an antiseptic cleaner which kills germs and bonds with the skin to continue killing germs even after washing.  Please DO NOT use if you have an allergy to CHG or antibacterial soaps.  If your skin becomes reddened/irritated stop using the CHG and inform your nurse when you arrive at Short Stay.  Do not shave (including legs and underarms) for at least 48 hours prior to the first CHG shower.  You may shave your face.  Please follow these instructions carefully:   1.  Shower with CHG Soap the night before surgery and the  morning of  Surgery.  2.  If you choose to wash your hair, wash your hair first as usual with your  normal shampoo.  3.  After you shampoo, rinse your hair and body thoroughly to remove the  Shampoo.  4.  Use CHG as you would any other liquid soap.  You can apply chg directly to the skin and wash gently with scrungie or a clean washcloth.  5.  Apply the CHG Soap to your body ONLY FROM THE NECK DOWN.    Do not use on open wounds or open sores.  Avoid contact with your eyes, ears, mouth and genitals (private parts).  Wash genitals (private parts)   with your normal soap.  6.  Wash thoroughly, paying special attention to the area where your surgery will be performed.  7.  Thoroughly rinse your body with warm water from the neck down.  8.  DO NOT shower/wash with your normal soap after using and rinsing off   the CHG Soap.  9.  Pat yourself dry with a clean towel.            10.  Wear clean pajamas.            11 .  Place clean  sheets on your bed the night of your first shower and do not sleep with pets.  Day of Surgery  Do not apply any lotions/deodorants the morning of surgery.  Please wear clean clothes to the hospital/surgery center.   Please read over the following fact sheets that you were given: Pain Booklet, Coughing and Deep Breathing and Surgical Site Infection Prevention

## 2014-12-31 MED ORDER — CEFAZOLIN SODIUM-DEXTROSE 2-3 GM-% IV SOLR
2.0000 g | INTRAVENOUS | Status: AC
  Administered 2015-01-01: 2 g via INTRAVENOUS
  Filled 2014-12-31: qty 50

## 2015-01-01 ENCOUNTER — Ambulatory Visit (HOSPITAL_COMMUNITY): Payer: Medicare Other

## 2015-01-01 ENCOUNTER — Ambulatory Visit (HOSPITAL_COMMUNITY): Payer: Medicare Other | Admitting: Anesthesiology

## 2015-01-01 ENCOUNTER — Encounter (HOSPITAL_COMMUNITY)
Admission: RE | Disposition: A | Discharge status: Home / Self Care | Payer: Self-pay | Source: Ambulatory Visit | Attending: General Surgery

## 2015-01-01 ENCOUNTER — Encounter (HOSPITAL_COMMUNITY): Payer: Self-pay | Admitting: *Deleted

## 2015-01-01 ENCOUNTER — Ambulatory Visit (HOSPITAL_COMMUNITY)
Admission: RE | Admit: 2015-01-01 | Discharge: 2015-01-01 | Disposition: A | Discharge status: Home / Self Care | Payer: Medicare Other | Source: Ambulatory Visit | Attending: General Surgery | Admitting: General Surgery

## 2015-01-01 DIAGNOSIS — N649 Disorder of breast, unspecified: Secondary | ICD-10-CM | POA: Diagnosis not present

## 2015-01-01 DIAGNOSIS — C50911 Malignant neoplasm of unspecified site of right female breast: Secondary | ICD-10-CM | POA: Insufficient documentation

## 2015-01-01 DIAGNOSIS — E039 Hypothyroidism, unspecified: Secondary | ICD-10-CM | POA: Diagnosis not present

## 2015-01-01 DIAGNOSIS — Z6832 Body mass index (BMI) 32.0-32.9, adult: Secondary | ICD-10-CM | POA: Diagnosis not present

## 2015-01-01 DIAGNOSIS — Z4682 Encounter for fitting and adjustment of non-vascular catheter: Secondary | ICD-10-CM | POA: Diagnosis not present

## 2015-01-01 DIAGNOSIS — M1990 Unspecified osteoarthritis, unspecified site: Secondary | ICD-10-CM | POA: Insufficient documentation

## 2015-01-01 DIAGNOSIS — Z9221 Personal history of antineoplastic chemotherapy: Secondary | ICD-10-CM | POA: Insufficient documentation

## 2015-01-01 DIAGNOSIS — Z95828 Presence of other vascular implants and grafts: Secondary | ICD-10-CM

## 2015-01-01 DIAGNOSIS — G4730 Sleep apnea, unspecified: Secondary | ICD-10-CM | POA: Diagnosis not present

## 2015-01-01 DIAGNOSIS — Z419 Encounter for procedure for purposes other than remedying health state, unspecified: Secondary | ICD-10-CM

## 2015-01-01 HISTORY — PX: PORTACATH PLACEMENT: SHX2246

## 2015-01-01 HISTORY — PX: RE-EXCISION OF BREAST CANCER,SUPERIOR MARGINS: SHX6047

## 2015-01-01 SURGERY — RE-EXCISION OF BREAST CANCER,SUPERIOR MARGINS
Anesthesia: General | Site: Breast | Laterality: Right

## 2015-01-01 MED ORDER — PHENYLEPHRINE 40 MCG/ML (10ML) SYRINGE FOR IV PUSH (FOR BLOOD PRESSURE SUPPORT)
PREFILLED_SYRINGE | INTRAVENOUS | Status: AC
  Filled 2015-01-01: qty 10

## 2015-01-01 MED ORDER — HYDROMORPHONE HCL 1 MG/ML IJ SOLN
0.2500 mg | INTRAMUSCULAR | Status: DC | PRN
  Administered 2015-01-01 (×3): 0.25 mg via INTRAVENOUS

## 2015-01-01 MED ORDER — SODIUM CHLORIDE 0.9 % IJ SOLN
INTRAMUSCULAR | Status: AC
  Filled 2015-01-01: qty 10

## 2015-01-01 MED ORDER — HEPARIN SOD (PORK) LOCK FLUSH 100 UNIT/ML IV SOLN
INTRAVENOUS | Status: AC
  Filled 2015-01-01: qty 5

## 2015-01-01 MED ORDER — ONDANSETRON HCL 4 MG/2ML IJ SOLN
INTRAMUSCULAR | Status: DC | PRN
  Administered 2015-01-01: 4 mg via INTRAVENOUS

## 2015-01-01 MED ORDER — LIDOCAINE HCL (CARDIAC) 20 MG/ML IV SOLN
INTRAVENOUS | Status: AC
  Filled 2015-01-01: qty 5

## 2015-01-01 MED ORDER — LACTATED RINGERS IV SOLN
INTRAVENOUS | Status: DC
  Administered 2015-01-01: 10:00:00 via INTRAVENOUS

## 2015-01-01 MED ORDER — OXYCODONE HCL 5 MG PO TABS: 5.0000 mg | ORAL_TABLET | ORAL | Status: DC | PRN

## 2015-01-01 MED ORDER — PROPOFOL 10 MG/ML IV BOLUS
INTRAVENOUS | Status: AC
  Filled 2015-01-01: qty 20

## 2015-01-01 MED ORDER — EPHEDRINE SULFATE 50 MG/ML IJ SOLN
INTRAMUSCULAR | Status: AC
  Filled 2015-01-01: qty 1

## 2015-01-01 MED ORDER — LACTATED RINGERS IV SOLN
INTRAVENOUS | Status: DC | PRN
  Administered 2015-01-01 (×2): via INTRAVENOUS

## 2015-01-01 MED ORDER — HYDROMORPHONE HCL 1 MG/ML IJ SOLN
INTRAMUSCULAR | Status: AC
  Filled 2015-01-01: qty 1

## 2015-01-01 MED ORDER — OXYCODONE HCL 5 MG/5ML PO SOLN: 5.0000 mg | Freq: Once | ORAL | Status: DC | PRN

## 2015-01-01 MED ORDER — BUPIVACAINE-EPINEPHRINE (PF) 0.25% -1:200000 IJ SOLN
INTRAMUSCULAR | Status: AC
  Filled 2015-01-01: qty 30

## 2015-01-01 MED ORDER — ROCURONIUM BROMIDE 50 MG/5ML IV SOLN
INTRAVENOUS | Status: AC
  Filled 2015-01-01: qty 1

## 2015-01-01 MED ORDER — HEPARIN SOD (PORK) LOCK FLUSH 100 UNIT/ML IV SOLN
INTRAVENOUS | Status: DC | PRN
  Administered 2015-01-01: 450 [IU] via INTRAVENOUS

## 2015-01-01 MED ORDER — OXYCODONE HCL 5 MG PO TABS: 5.0000 mg | ORAL_TABLET | Freq: Once | ORAL | Status: DC | PRN

## 2015-01-01 MED ORDER — DEXAMETHASONE SODIUM PHOSPHATE 10 MG/ML IJ SOLN
INTRAMUSCULAR | Status: DC | PRN
  Administered 2015-01-01: 8 mg via INTRAVENOUS

## 2015-01-01 MED ORDER — OXYCODONE-ACETAMINOPHEN 10-325 MG PO TABS: 1.0000 | ORAL_TABLET | Freq: Four times a day (QID) | ORAL | Status: DC | PRN

## 2015-01-01 MED ORDER — 0.9 % SODIUM CHLORIDE (POUR BTL) OPTIME
TOPICAL | Status: DC | PRN
  Administered 2015-01-01: 1000 mL

## 2015-01-01 MED ORDER — SUCCINYLCHOLINE CHLORIDE 20 MG/ML IJ SOLN
INTRAMUSCULAR | Status: AC
  Filled 2015-01-01: qty 1

## 2015-01-01 MED ORDER — ONDANSETRON HCL 4 MG/2ML IJ SOLN
INTRAMUSCULAR | Status: AC
  Filled 2015-01-01: qty 2

## 2015-01-01 MED ORDER — PHENYLEPHRINE HCL 10 MG/ML IJ SOLN
INTRAMUSCULAR | Status: DC | PRN
  Administered 2015-01-01 (×5): 80 ug via INTRAVENOUS

## 2015-01-01 MED ORDER — PROPOFOL 10 MG/ML IV BOLUS
INTRAVENOUS | Status: DC | PRN
  Administered 2015-01-01: 150 mg via INTRAVENOUS

## 2015-01-01 MED ORDER — PROMETHAZINE HCL 25 MG/ML IJ SOLN: 6.2500 mg | INTRAMUSCULAR | Status: DC | PRN

## 2015-01-01 MED ORDER — LIDOCAINE HCL (CARDIAC) 20 MG/ML IV SOLN
INTRAVENOUS | Status: DC | PRN
  Administered 2015-01-01: 50 mg via INTRAVENOUS

## 2015-01-01 MED ORDER — SODIUM CHLORIDE 0.9 % IR SOLN
Status: DC | PRN
  Administered 2015-01-01: 500 mL

## 2015-01-01 MED ORDER — SODIUM CHLORIDE 0.9 % IV SOLN: INTRAVENOUS | Status: DC

## 2015-01-01 MED ORDER — FENTANYL CITRATE 0.05 MG/ML IJ SOLN
INTRAMUSCULAR | Status: DC | PRN
  Administered 2015-01-01: 50 ug via INTRAVENOUS
  Administered 2015-01-01 (×2): 25 ug via INTRAVENOUS

## 2015-01-01 MED ORDER — BUPIVACAINE HCL (PF) 0.25 % IJ SOLN
INTRAMUSCULAR | Status: AC
  Filled 2015-01-01: qty 30

## 2015-01-01 MED ORDER — FENTANYL CITRATE 0.05 MG/ML IJ SOLN
INTRAMUSCULAR | Status: AC
  Filled 2015-01-01: qty 5

## 2015-01-01 MED ORDER — BUPIVACAINE HCL (PF) 0.25 % IJ SOLN
INTRAMUSCULAR | Status: DC | PRN
  Administered 2015-01-01: 10 mL

## 2015-01-01 SURGICAL SUPPLY — 79 items
APL SKNCLS STERI-STRIP NONHPOA (GAUZE/BANDAGES/DRESSINGS) ×2
APPLIER CLIP 9.375 MED OPEN (MISCELLANEOUS) ×4
APR CLP MED 9.3 20 MLT OPN (MISCELLANEOUS) ×2
BAG DECANTER FOR FLEXI CONT (MISCELLANEOUS) ×4 IMPLANT
BENZOIN TINCTURE PRP APPL 2/3 (GAUZE/BANDAGES/DRESSINGS) ×4 IMPLANT
BINDER BREAST LRG (GAUZE/BANDAGES/DRESSINGS) IMPLANT
BINDER BREAST XLRG (GAUZE/BANDAGES/DRESSINGS) IMPLANT
BLADE SURG 11 STRL SS (BLADE) ×4 IMPLANT
BLADE SURG 15 STRL LF DISP TIS (BLADE) ×2 IMPLANT
BLADE SURG 15 STRL SS (BLADE) ×4
BLADE SURG ROTATE 9660 (MISCELLANEOUS) IMPLANT
CANISTER SUCTION 2500CC (MISCELLANEOUS) IMPLANT
CHLORAPREP W/TINT 26ML (MISCELLANEOUS) ×4 IMPLANT
CLIP APPLIE 9.375 MED OPEN (MISCELLANEOUS) ×2 IMPLANT
CLOSURE WOUND 1/2 X4 (GAUZE/BANDAGES/DRESSINGS) ×1
CONT SPEC STER OR (MISCELLANEOUS) IMPLANT
COVER SURGICAL LIGHT HANDLE (MISCELLANEOUS) ×4 IMPLANT
CRADLE DONUT ADULT HEAD (MISCELLANEOUS) ×4 IMPLANT
DECANTER SPIKE VIAL GLASS SM (MISCELLANEOUS) ×4 IMPLANT
DEVICE DUBIN SPECIMEN MAMMOGRA (MISCELLANEOUS) IMPLANT
DRAPE C-ARM 42X72 X-RAY (DRAPES) ×4 IMPLANT
DRAPE LAPAROSCOPIC ABDOMINAL (DRAPES) ×4 IMPLANT
DRAPE PED LAPAROTOMY (DRAPES) ×4 IMPLANT
DRAPE UTILITY XL STRL (DRAPES) ×3 IMPLANT
DRSG OPSITE 4X5.5 SM (GAUZE/BANDAGES/DRESSINGS) ×4 IMPLANT
ELECT CAUTERY BLADE 6.4 (BLADE) ×4 IMPLANT
ELECT REM PT RETURN 9FT ADLT (ELECTROSURGICAL) ×4
ELECTRODE REM PT RTRN 9FT ADLT (ELECTROSURGICAL) ×2 IMPLANT
GAUZE SPONGE 4X4 12PLY STRL (GAUZE/BANDAGES/DRESSINGS) ×4 IMPLANT
GAUZE SPONGE 4X4 16PLY XRAY LF (GAUZE/BANDAGES/DRESSINGS) ×4 IMPLANT
GLOVE BIO SURGEON STRL SZ7 (GLOVE) ×6 IMPLANT
GLOVE BIOGEL PI IND STRL 7.0 (GLOVE) IMPLANT
GLOVE BIOGEL PI IND STRL 7.5 (GLOVE) ×2 IMPLANT
GLOVE BIOGEL PI INDICATOR 7.0 (GLOVE) ×6
GLOVE BIOGEL PI INDICATOR 7.5 (GLOVE) ×2
GOWN STRL REUS W/ TWL LRG LVL3 (GOWN DISPOSABLE) ×4 IMPLANT
GOWN STRL REUS W/TWL LRG LVL3 (GOWN DISPOSABLE) ×8
INTRODUCER COOK 11FR (CATHETERS) IMPLANT
KIT BASIN OR (CUSTOM PROCEDURE TRAY) ×4 IMPLANT
KIT MARKER MARGIN INK (KITS) ×4 IMPLANT
KIT PORT POWER 8FR ISP CVUE (Catheter) ×3 IMPLANT
KIT PORT POWER 9.6FR MRI PREA (Catheter) IMPLANT
KIT PORT POWER ISP 8FR (Catheter) IMPLANT
KIT POWER CATH 8FR (Catheter) IMPLANT
KIT ROOM TURNOVER OR (KITS) ×4 IMPLANT
LIQUID BAND (GAUZE/BANDAGES/DRESSINGS) ×7 IMPLANT
NDL 18GX1X1/2 (RX/OR ONLY) (NEEDLE) IMPLANT
NDL HYPO 25GX1X1/2 BEV (NEEDLE) ×2 IMPLANT
NEEDLE 18GX1X1/2 (RX/OR ONLY) (NEEDLE) ×4 IMPLANT
NEEDLE HYPO 25GX1X1/2 BEV (NEEDLE) ×4 IMPLANT
NS IRRIG 1000ML POUR BTL (IV SOLUTION) ×4 IMPLANT
PACK SURGICAL SETUP 50X90 (CUSTOM PROCEDURE TRAY) ×4 IMPLANT
PAD ARMBOARD 7.5X6 YLW CONV (MISCELLANEOUS) ×8 IMPLANT
PENCIL BUTTON HOLSTER BLD 10FT (ELECTRODE) ×4 IMPLANT
SET INTRODUCER 12FR PACEMAKER (SHEATH) IMPLANT
SET SHEATH INTRODUCER 10FR (MISCELLANEOUS) IMPLANT
SHEATH COOK PEEL AWAY SET 9F (SHEATH) IMPLANT
SPONGE LAP 4X18 X RAY DECT (DISPOSABLE) ×4 IMPLANT
STRIP CLOSURE SKIN 1/2X4 (GAUZE/BANDAGES/DRESSINGS) ×3 IMPLANT
SUT MNCRL AB 4-0 PS2 18 (SUTURE) ×7 IMPLANT
SUT PROLENE 2 0 SH DA (SUTURE) ×4 IMPLANT
SUT SILK 2 0 (SUTURE)
SUT SILK 2 0 SH (SUTURE) IMPLANT
SUT SILK 2-0 18XBRD TIE 12 (SUTURE) IMPLANT
SUT VIC AB 2-0 SH 27 (SUTURE) ×8
SUT VIC AB 2-0 SH 27X BRD (SUTURE) ×2 IMPLANT
SUT VIC AB 3-0 SH 27 (SUTURE) ×8
SUT VIC AB 3-0 SH 27XBRD (SUTURE) ×2 IMPLANT
SYR 20ML ECCENTRIC (SYRINGE) ×8 IMPLANT
SYR 5ML LUER SLIP (SYRINGE) ×4 IMPLANT
SYR BULB 3OZ (MISCELLANEOUS) ×4 IMPLANT
SYR CONTROL 10ML LL (SYRINGE) ×4 IMPLANT
SYRINGE 10CC LL (SYRINGE) ×2 IMPLANT
TOWEL OR 17X24 6PK STRL BLUE (TOWEL DISPOSABLE) ×4 IMPLANT
TOWEL OR 17X26 10 PK STRL BLUE (TOWEL DISPOSABLE) ×4 IMPLANT
TUBE CONNECTING 12'X1/4 (SUCTIONS)
TUBE CONNECTING 12X1/4 (SUCTIONS) IMPLANT
WATER STERILE IRR 1000ML POUR (IV SOLUTION) IMPLANT
YANKAUER SUCT BULB TIP NO VENT (SUCTIONS) IMPLANT

## 2015-01-01 NOTE — H&P (Signed)
  80 yof who has history of left mastectomy/sn biopsy by Dr Rebekah Chesterfield in 1997 followed by chemo/tamoxifen for five years. She has no family history. She does have three daughters. She has done well since then. She has no breast complaints or any nipple discharge. She has undergone her usual annual screening mm that showed a right upper inner quadrant mass confirmed by ultrasound to be 64m in size. Axillary uKoreais negative. 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 She has undergone lumpectomy with focal margin involvement.  She also has a node positive and has been seen by oncology and will get chemotherapy   Other Problems  Arthritis Back Pain Breast Cancer Diverticulosis General anesthesia - complications Lump In Breast Other disease, cancer, significant illness Sleep Apnea Thyroid Disease  Past Surgical History Breast Biopsy Bilateral. Breast Mass; Local Excision Left. Cataract Surgery Bilateral. Colon Removal - Partial Hysterectomy (not due to cancer) - Partial Mastectomy Left. Open Inguinal Hernia Surgery Bilateral. Oral Surgery Resection of Small Bowel Sentinel Lymph Node Biopsy Tonsillectomy  Social History  Alcohol use Occasional alcohol use. No caffeine use Tobacco use Never smoker.  Review of Systems  General Not Present- Appetite Loss, Chills, Fatigue, Fever, Night Sweats, Weight Gain and Weight Loss. Skin Present- Dryness. Not Present- Change in Wart/Mole, Hives, Jaundice, New Lesions, Non-Healing Wounds, Rash and Ulcer. HEENT Present- Seasonal Allergies and Wears glasses/contact lenses. Not Present- Earache, Hearing Loss, Hoarseness, Nose Bleed, Oral Ulcers, Ringing in the Ears, Sinus Pain, Sore Throat, Visual Disturbances and Yellow Eyes. Respiratory Present- Snoring. Not Present- Bloody sputum, Chronic Cough, Difficulty Breathing and Wheezing. Breast Present-  Breast Mass and Breast Pain. Not Present- Nipple Discharge and Skin Changes. Cardiovascular Not Present- Chest Pain, Difficulty Breathing Lying Down, Leg Cramps, Palpitations, Rapid Heart Rate, Shortness of Breath and Swelling of Extremities. Gastrointestinal Present- Gets full quickly at meals. Not Present- Abdominal Pain, Bloating, Bloody Stool, Change in Bowel Habits, Chronic diarrhea, Constipation, Difficulty Swallowing, Excessive gas, Hemorrhoids, Indigestion, Nausea, Rectal Pain and Vomiting. Female Genitourinary Present- Nocturia and Urgency. Not Present- Frequency, Painful Urination and Pelvic Pain. Musculoskeletal Present- Back Pain. Not Present- Joint Pain, Joint Stiffness, Muscle Pain, Muscle Weakness and Swelling of Extremities. Neurological Not Present- Decreased Memory, Fainting, Headaches, Numbness, Seizures, Tingling, Tremor, Trouble walking and Weakness. Psychiatric Not Present- Anxiety, Bipolar, Change in Sleep Pattern, Depression, Fearful and Frequent crying. Endocrine Present- Hair Changes. Not Present- Cold Intolerance, Excessive Hunger, Heat Intolerance, Hot flashes and New Diabetes. Hematology Not Present- Easy Bruising, Excessive bleeding, Gland problems, HIV and Persistent Infections.   Physical Exam  General Mental Status-Alert. Orientation-Oriented X3. Chest and Lung Exam Chest and lung exam reveals -on auscultation, normal breath sounds, no adventitious sounds and normal vocal resonance. Breast Nipples Discharge - Right - None. Breast - Left-Mastectomy scar. Breast - Right-Normal. Breast Lump-No Palpable Breast Mass. Cardiovascular Cardiovascular examination reveals -normal heart sounds, regular rate and rhythm with no murmurs.  Lymphatic Head & Neck  General Head & Neck Lymphatics: Bilateral - Description - Normal. Axillary  General Axillary Region: Bilateral - Description - Normal. Note: no [place] adenopathy     Assessment & Plan   CARCINOMA OF BREAST UPPER INNER QUADRANT (174.2  C50.219) Re-excision of margin, port placement

## 2015-01-01 NOTE — Interval H&P Note (Signed)
History and Physical Interval Note:  01/01/2015 10:14 AM  Shawna Marquez  has presented today for surgery, with the diagnosis of right breast cancer  The various methods of treatment have been discussed with the patient and family. After consideration of risks, benefits and other options for treatment, the patient has consented to  Procedure(s): RIGHT BREAST MARGIN EXCISION (Right) INSERTION PORT-A-CATH (N/A) as a surgical intervention .  The patient's history has been reviewed, patient examined, no change in status, stable for surgery.  I have reviewed the patient's chart and labs.  Questions were answered to the patient's satisfaction.     Jkai Arwood

## 2015-01-01 NOTE — Anesthesia Procedure Notes (Signed)
Procedure Name: LMA Insertion Date/Time: 01/01/2015 10:45 AM Performed by: Neldon Newport Pre-anesthesia Checklist: Patient being monitored, Suction available, Emergency Drugs available, Patient identified and Timeout performed Patient Re-evaluated:Patient Re-evaluated prior to inductionOxygen Delivery Method: Circle system utilized Preoxygenation: Pre-oxygenation with 100% oxygen Intubation Type: IV induction Ventilation: Mask ventilation without difficulty LMA: LMA inserted LMA Size: 4.0 Number of attempts: 1 Placement Confirmation: positive ETCO2 and breath sounds checked- equal and bilateral Tube secured with: Tape Dental Injury: Teeth and Oropharynx as per pre-operative assessment

## 2015-01-01 NOTE — Anesthesia Postprocedure Evaluation (Signed)
  Anesthesia Post-op Note  Patient: Shawna Marquez  Procedure(s) Performed: Procedure(s): RIGHT BREAST MARGIN EXCISION (Right) INSERTION PORT-A-CATH (Left)  Patient Location: PACU  Anesthesia Type:General  Level of Consciousness: awake and alert   Airway and Oxygen Therapy: Patient Spontanous Breathing  Post-op Pain: none  Post-op Assessment: Post-op Vital signs reviewed  Post-op Vital Signs: Reviewed  Last Vitals:  Filed Vitals:   01/01/15 1302  BP: 152/82  Pulse: 80  Temp:   Resp: 10    Complications: No apparent anesthesia complications

## 2015-01-01 NOTE — Op Note (Signed)
Preoperative diagnosis: Breast cancer needs venous access for chemotherapy, positive margin on lumpectomy Postoperative diagnosis: same as above Procedure: Left subclavian PowerPort insertion, re-excision lumpectomy right breast, attempted aspiration of axillary seroma Surgeon: Dr. Serita Grammes Anesthesia: general with lma Estimated blood loss: Minimal Drains: None Counts gauge: None Sponge count was correct Disposition to recovery stable  Indications: This a 51 yof s/p right lumpectomy, sn biopsy with positive node and positive posterior margin. She will begin chemotherapy and we discussed port placement and margin excision. She would also like attempt at axillary seroma aspiration but I think this is just scar. I told I would attempt this at same time.  Procedure: After informed consent was obtained the patient was taken to the operating room. She was given cefazolin. SCDs were placed. She was placed under general anesthesia. Arms were tucked and appropriately padded. She was then prepped and draped in a standard sterile surgical fashion. A surgical timeout was then performed.  I infiltrated Marcaine throughout the left chest wall as well as onto the clavicle. I accessed her subclavian vein on the first pass. The wire was placed. I then made a pocket overlying the pectoralis fascia. I then tunneled the line between the 2 sites. I then dilated the tract. I then inserted the peel-away sheath and dilator assembly and watched this go into position under fluoroscopy. I removed the wire assembly. I then placed the line. The peel-away sheath was removed. The line was pulled back to be in the distal cava. The port was then attached. This was secured with 2-0 Prolene in 2 places. I then flushed this and aspirated blood. I then closed this with 3-0 Vicryl and 4-0 Monocryl.This aspirated blood and I placed heparin in the port. Dermabond was placed over the wound  I then reopened her right breast incision.  I removed the posterior margin down to the pectoralis muscle with attempt to get clear margin.  This was hemostatic.  I then closed with 2-0 vicryl, 3-0 vicryl and 4-0 monocryl.  Glue was placed over this also with steristrips.  I then cleansed the right axilla and attempted to aspirate it.  There was no fluid.  She was then extubated and transferred to recovery stable.

## 2015-01-01 NOTE — Transfer of Care (Signed)
Immediate Anesthesia Transfer of Care Note  Patient: Shawna Marquez  Procedure(s) Performed: Procedure(s): RIGHT BREAST MARGIN EXCISION (Right) INSERTION PORT-A-CATH (Left)  Patient Location: PACU  Anesthesia Type:General  Level of Consciousness: awake, patient cooperative and responds to stimulation  Airway & Oxygen Therapy: Patient Spontanous Breathing and Patient connected to nasal cannula oxygen  Post-op Assessment: Report given to PACU RN and Post -op Vital signs reviewed and stable  Post vital signs: Reviewed and stable  Complications: No apparent anesthesia complications

## 2015-01-01 NOTE — Discharge Instructions (Signed)
[facility] °Office Phone Number [phone] ° °BREAST BIOPSY/ PARTIAL MASTECTOMY: POST OP INSTRUCTIONS ° °Always review your discharge instruction sheet given to you by the facility where your surgery was performed. ° °IF YOU HAVE DISABILITY OR FAMILY LEAVE FORMS, YOU MUST BRING THEM TO THE OFFICE FOR PROCESSING.  DO NOT GIVE THEM TO YOUR DOCTOR. ° °1. A prescription for pain medication may be given to you upon discharge.  Take your pain medication as prescribed, if needed.  If narcotic pain medicine is not needed, then you may take acetaminophen (Tylenol), naprosyn (Alleve) or ibuprofen (Advil) as needed. °2. Take your usually prescribed medications unless otherwise directed °3. If you need a refill on your pain medication, please contact your pharmacy.  They will contact our office to request authorization.  Prescriptions will not be filled after 5pm or on week-ends. °4. You should eat very light the first 24 hours after surgery, such as soup, crackers, pudding, etc.  Resume your normal diet the day after surgery. °5. Most patients will experience some swelling and bruising in the breast.  Ice packs and a good support bra will help.  Wear the breast binder provided or a sports bra for 72 hours day and night.  After that wear a sports bra during the day until you return to the office. Swelling and bruising can take several days to resolve.  °6. It is common to experience some constipation if taking pain medication after surgery.  Increasing fluid intake and taking a stool softener will usually help or prevent this problem from occurring.  A mild laxative (Milk of Magnesia or Miralax) should be taken according to package directions if there are no bowel movements after 48 hours. °7. Unless discharge instructions indicate otherwise, you may remove your bandages 48 hours after surgery and you may shower at that time.  You may have steri-strips (small skin tapes) in place directly over the incision.   These strips should be left on the skin for 7-10 days and will come off on their own.  If your surgeon used skin glue on the incision, you may shower in 24 hours.  The glue will flake off over the next 2-3 weeks.  Any sutures or staples will be removed at the office during your follow-up visit. °8. ACTIVITIES:  You may resume regular daily activities (gradually increasing) beginning the next day.  Wearing a good support bra or sports bra minimizes pain and swelling.  You may have sexual intercourse when it is comfortable. °a. You may drive when you no longer are taking prescription pain medication, you can comfortably wear a seatbelt, and you can safely maneuver your car and apply brakes. °b. RETURN TO WORK:  ______________________________________________________________________________________ °9. You should see your doctor in the office for a follow-up appointment approximately two weeks after your surgery.  Your doctor’s nurse will typically make your follow-up appointment when she calls you with your pathology report.  Expect your pathology report 3-4 business days after your surgery.  You may call to check if you do not hear from us after three days. °10. OTHER INSTRUCTIONS: _______________________________________________________________________________________________ _____________________________________________________________________________________________________________________________________ °_____________________________________________________________________________________________________________________________________ °_____________________________________________________________________________________________________________________________________ ° °WHEN TO CALL DR WAKEFIELD: °1. Fever over 101.0 °2. Nausea and/or vomiting. °3. Extreme swelling or bruising. °4. Continued bleeding from incision. °5. Increased pain, redness, or drainage from the incision. ° °The clinic staff is available to  answer your questions during regular business hours.  Please don’t hesitate to call and ask to speak to one of the nurses for   clinical concerns.  If you have a medical emergency, go to the nearest emergency room or call 911.  A surgeon from [facility] is always on call at the hospital.  For further questions, please visit [email or web address]       PORT-A-CATH: POST OP INSTRUCTIONS  Always review your discharge instruction sheet given to you by the facility where your surgery was performed.   1. A prescription for pain medication may be given to you upon discharge. Take your pain medication as prescribed, if needed. If narcotic pain medicine is not needed, then you make take acetaminophen (Tylenol) or ibuprofen (Advil) as needed.  2. Take your usually prescribed medications unless otherwise directed. 3. If you need a refill on your pain medication, please contact our office. All narcotic pain medicine now requires a paper prescription.  Phoned in and fax refills are no longer allowed by law.  Prescriptions will not be filled after 5 pm or on weekends.  4. You should follow a light diet for the remainder of the day after your procedure. 5. Most patients will experience some mild swelling and/or bruising in the area of the incision. It may take several days to resolve. 6. It is common to experience some constipation if taking pain medication after surgery. Increasing fluid intake and taking a stool softener (such as Colace) will usually help or prevent this problem from occurring. A mild laxative (Milk of Magnesia or Miralax) should be taken according to package directions if there are no bowel movements after 48 hours.  7. Unless discharge instructions indicate otherwise, you may remove your bandages 48 hours after surgery, and you may shower at that time. You may have steri-strips (small white skin tapes) in place directly over the incision.  These strips should be left on  the skin for 7-10 days.  If your surgeon used Dermabond (skin glue) on the incision, you may shower in 24 hours.  The glue will flake off over the next 2-3 weeks.  8. If your port is left accessed at the end of surgery (needle left in port), the dressing cannot get wet and should only by changed by a healthcare professional. When the port is no longer accessed (when the needle has been removed), follow step 7.   9. ACTIVITIES:  Limit activity involving your arms for the next 72 hours. Do no strenuous exercise or activity for 1 week. You may drive when you are no longer taking prescription pain medication, you can comfortably wear a seatbelt, and you can maneuver your car. 10.You may need to see your doctor in the office for a follow-up appointment.  Please       check with your doctor.  11.When you receive a new Port-a-Cath, you will get a product guide and        ID card.  Please keep them in case you need them.  WHEN TO CALL YOUR DOCTOR [phone]): 1. Fever over 101.0 2. Chills 3. Continued bleeding from incision 4. Increased redness and tenderness at the site 5. Shortness of breath, difficulty breathing   The clinic staff is available to answer your questions during regular business hours. Please dont hesitate to call and ask to speak to one of the nurses or medical assistants for clinical concerns. If you have a medical emergency, go to the nearest emergency room or call 911.  A surgeon from [facility] is always on call at the hospital.     For further information, please visit  [email or web address]     General Anesthesia, Adult, Care After  Refer to this sheet in the next few weeks. These instructions provide you with information on caring for yourself after your procedure. Your health care provider may also give you more specific instructions. Your treatment has been planned according to current medical practices, but problems sometimes occur. Call your  health care provider if you have any problems or questions after your procedure.  WHAT TO EXPECT AFTER THE PROCEDURE  After the procedure, it is typical to experience:  Sleepiness.  Nausea and vomiting. HOME CARE INSTRUCTIONS  For the first 24 hours after general anesthesia:  Have a responsible person with you.  Do not drive a car. If you are alone, do not take public transportation.  Do not drink alcohol.  Do not take medicine that has not been prescribed by your health care provider.  Do not sign important papers or make important decisions.  You may resume a normal diet and activities as directed by your health care provider.  Change bandages (dressings) as directed.  If you have questions or problems that seem related to general anesthesia, call the hospital and ask for the anesthetist or anesthesiologist on call. SEEK MEDICAL CARE IF:  You have nausea and vomiting that continue the day after anesthesia.  You develop a rash. SEEK IMMEDIATE MEDICAL CARE IF:  You have difficulty breathing.  You have chest pain.  You have any allergic problems. Document Released: 03/02/2001 Document Revised: 07/27/2013 Document Reviewed: 06/09/2013  [facility] Patient Information 2014 [place], [place].

## 2015-01-01 NOTE — Progress Notes (Signed)
Dr Donne Hazel called and asked which side to start IV on.  Ok to use left side.  Pt reports it has been over 18 years since she had surgery on the Left breast.

## 2015-01-01 NOTE — Anesthesia Preprocedure Evaluation (Addendum)
Anesthesia Evaluation  Patient identified by MRN, date of birth, ID band Patient awake    Reviewed: Allergy & Precautions, NPO status , Patient's Chart, lab work & pertinent test results  Airway Mallampati: III  TM Distance: <3 FB Neck ROM: Full    Dental  (+) Teeth Intact   Pulmonary sleep apnea and Continuous Positive Airway Pressure Ventilation ,          Cardiovascular negative cardio ROS      Neuro/Psych negative neurological ROS     GI/Hepatic negative GI ROS, Neg liver ROS,   Endo/Other  Hypothyroidism Morbid obesity  Renal/GU negative Renal ROS     Musculoskeletal  (+) Arthritis -,   Abdominal   Peds  Hematology negative hematology ROS (+)   Anesthesia Other Findings   Reproductive/Obstetrics                            Anesthesia Physical Anesthesia Plan  ASA: II  Anesthesia Plan: General   Post-op Pain Management:    Induction: Intravenous  Airway Management Planned: LMA  Additional Equipment:   Intra-op Plan:   Post-operative Plan:   Informed Consent: I have reviewed the patients History and Physical, chart, labs and discussed the procedure including the risks, benefits and alternatives for the proposed anesthesia with the patient or authorized representative who has indicated his/her understanding and acceptance.     Plan Discussed with:   Anesthesia Plan Comments:         Anesthesia Quick Evaluation

## 2015-01-02 ENCOUNTER — Encounter (HOSPITAL_COMMUNITY): Payer: Self-pay | Admitting: General Surgery

## 2015-01-03 ENCOUNTER — Encounter: Payer: Self-pay | Admitting: Internal Medicine

## 2015-01-03 ENCOUNTER — Other Ambulatory Visit: Payer: Self-pay | Admitting: *Deleted

## 2015-01-03 ENCOUNTER — Telehealth: Payer: Self-pay | Admitting: Hematology and Oncology

## 2015-01-03 NOTE — Telephone Encounter (Signed)
, °

## 2015-01-05 ENCOUNTER — Other Ambulatory Visit: Payer: Self-pay

## 2015-01-05 DIAGNOSIS — C50211 Malignant neoplasm of upper-inner quadrant of right female breast: Secondary | ICD-10-CM

## 2015-01-05 DIAGNOSIS — E038 Other specified hypothyroidism: Secondary | ICD-10-CM

## 2015-01-12 ENCOUNTER — Ambulatory Visit (HOSPITAL_BASED_OUTPATIENT_CLINIC_OR_DEPARTMENT_OTHER): Payer: Medicare Other | Admitting: Hematology and Oncology

## 2015-01-12 ENCOUNTER — Telehealth: Payer: Self-pay | Admitting: Hematology and Oncology

## 2015-01-12 ENCOUNTER — Ambulatory Visit (HOSPITAL_BASED_OUTPATIENT_CLINIC_OR_DEPARTMENT_OTHER): Payer: Medicare Other

## 2015-01-12 ENCOUNTER — Other Ambulatory Visit: Payer: Medicare Other

## 2015-01-12 ENCOUNTER — Other Ambulatory Visit (HOSPITAL_BASED_OUTPATIENT_CLINIC_OR_DEPARTMENT_OTHER): Payer: Medicare Other

## 2015-01-12 ENCOUNTER — Ambulatory Visit: Payer: Medicare Other | Attending: General Surgery | Admitting: Physical Therapy

## 2015-01-12 ENCOUNTER — Other Ambulatory Visit: Payer: Self-pay | Admitting: *Deleted

## 2015-01-12 ENCOUNTER — Telehealth: Payer: Self-pay | Admitting: *Deleted

## 2015-01-12 DIAGNOSIS — G8929 Other chronic pain: Secondary | ICD-10-CM | POA: Diagnosis not present

## 2015-01-12 DIAGNOSIS — C50211 Malignant neoplasm of upper-inner quadrant of right female breast: Secondary | ICD-10-CM

## 2015-01-12 DIAGNOSIS — E039 Hypothyroidism, unspecified: Secondary | ICD-10-CM

## 2015-01-12 DIAGNOSIS — C50219 Malignant neoplasm of upper-inner quadrant of unspecified female breast: Secondary | ICD-10-CM | POA: Diagnosis not present

## 2015-01-12 DIAGNOSIS — M549 Dorsalgia, unspecified: Secondary | ICD-10-CM | POA: Insufficient documentation

## 2015-01-12 DIAGNOSIS — M25611 Stiffness of right shoulder, not elsewhere classified: Secondary | ICD-10-CM | POA: Diagnosis not present

## 2015-01-12 DIAGNOSIS — E038 Other specified hypothyroidism: Secondary | ICD-10-CM

## 2015-01-12 DIAGNOSIS — Z95828 Presence of other vascular implants and grafts: Secondary | ICD-10-CM

## 2015-01-12 LAB — CBC WITH DIFFERENTIAL/PLATELET
BASO%: 0.9 % (ref 0.0–2.0)
BASOS ABS: 0.1 10*3/uL (ref 0.0–0.1)
EOS%: 12.6 % — AB (ref 0.0–7.0)
Eosinophils Absolute: 0.9 10*3/uL — ABNORMAL HIGH (ref 0.0–0.5)
HEMATOCRIT: 38.4 % (ref 34.8–46.6)
HGB: 12.4 g/dL (ref 11.6–15.9)
LYMPH%: 30.8 % (ref 14.0–49.7)
MCH: 30.8 pg (ref 25.1–34.0)
MCHC: 32.3 g/dL (ref 31.5–36.0)
MCV: 95.5 fL (ref 79.5–101.0)
MONO#: 0.5 10*3/uL (ref 0.1–0.9)
MONO%: 7.7 % (ref 0.0–14.0)
NEUT#: 3.3 10*3/uL (ref 1.5–6.5)
NEUT%: 48 % (ref 38.4–76.8)
Platelets: 271 10*3/uL (ref 145–400)
RBC: 4.02 10*6/uL (ref 3.70–5.45)
RDW: 12.7 % (ref 11.2–14.5)
WBC: 6.8 10*3/uL (ref 3.9–10.3)
lymph#: 2.1 10*3/uL (ref 0.9–3.3)
nRBC: 1 % — ABNORMAL HIGH (ref 0–0)

## 2015-01-12 LAB — COMPREHENSIVE METABOLIC PANEL (CC13)
ALBUMIN: 3.5 g/dL (ref 3.5–5.0)
ALT: 16 U/L (ref 0–55)
ANION GAP: 10 meq/L (ref 3–11)
AST: 21 U/L (ref 5–34)
Alkaline Phosphatase: 76 U/L (ref 40–150)
BILIRUBIN TOTAL: 0.31 mg/dL (ref 0.20–1.20)
BUN: 12.4 mg/dL (ref 7.0–26.0)
CHLORIDE: 105 meq/L (ref 98–109)
CO2: 26 mEq/L (ref 22–29)
Calcium: 9.1 mg/dL (ref 8.4–10.4)
Creatinine: 0.7 mg/dL (ref 0.6–1.1)
EGFR: >90 mL/min/{1.73_m2} (ref >90)
GLUCOSE: 91 mg/dL (ref 70–140)
POTASSIUM: 4 meq/L (ref 3.5–5.1)
Sodium: 141 mEq/L (ref 136–145)
Total Protein: 6.9 g/dL (ref 6.4–8.3)

## 2015-01-12 LAB — TSH CHCC: TSH: 0.58 m(IU)/L (ref 0.308–3.960)

## 2015-01-12 MED ORDER — HEPARIN SOD (PORK) LOCK FLUSH 100 UNIT/ML IV SOLN
500.0000 [IU] | Freq: Once | INTRAVENOUS | Status: AC
  Administered 2015-01-12: 500 [IU] via INTRAVENOUS
  Filled 2015-01-12: qty 5

## 2015-01-12 MED ORDER — SODIUM CHLORIDE 0.9 % IJ SOLN
10.0000 mL | INTRAMUSCULAR | Status: DC | PRN
  Administered 2015-01-12: 10 mL via INTRAVENOUS
  Filled 2015-01-12: qty 10

## 2015-01-12 NOTE — Telephone Encounter (Signed)
I have adjusted 2/29 appt

## 2015-01-12 NOTE — Therapy (Signed)
[place], [place], [address] Phone: [phone]   Fax:  [phone]  Physical Therapy Evaluation  Patient Details  Name: Shawna Marquez MRN: [id number] Date of Birth: [birth year] Referring Provider:  Rolm Bookbinder, MD  Encounter Date: 01/12/2015      PT End of Session - 01/12/15 1201    Visit Number 1   Number of Visits 9   Date for PT Re-Evaluation 02/23/15   PT Start Time 0944   PT Stop Time 1023   PT Time Calculation (min) 39 min      Past Medical History  Diagnosis Date  . ALLERGIC RHINITIS 10/12/2007  . BREAST CANCER, HX OF 10/12/2007    right breast diagnosed - 11/2014  . DIVERTICULITIS, HX OF 10/12/2007  . HYPOTHYROIDISM, PRIMARY 10/12/2007  . 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 Wears glasses   . Family history of breast cancer   . Complication of anesthesia     bp will drop,hard to wake up  . Sleep apnea     USES cpap NIGHTLY  . OSTEOARTHRITIS 09/09/2007    back     Past Surgical History  Procedure Laterality Date  . Colovaginal fistula  2007    colon resection  . Abdominal hysterectomy  1977  . Hernia repair  2007    ventral  . Colon surgery  2006    part colectomy  . Mastectomy  1997    lt mast-axillary node dissection  . Tonsillectomy    . Colonoscopy  2007  . Wisdom tooth extraction    . Eye surgery      both cataracts  . Radioactive seed guided mastectomy with axillary sentinel lymph node biopsy Right 11/28/2014    Procedure: RADIOACTIVE SEED GUIDED RIGHT LUMPECTOMY WITH RIGHT AXILLARY SENTINEL LYMPH NODE BIOPSY;  Surgeon: Rolm Bookbinder, MD;  Location: [facility];  Service: General;  Laterality: Right;  . Re-excision of breast cancer,superior margins Right 01/01/2015    Procedure: RIGHT BREAST MARGIN EXCISION;  Surgeon: Rolm Bookbinder, MD;  Location: [facility];  Service: General;  Laterality: Right;  . Portacath placement  Left 01/01/2015    Procedure: INSERTION PORT-A-CATH;  Surgeon: Rolm Bookbinder, MD;  Location: [facility];  Service: General;  Laterality: Left;    There were no vitals taken for this visit.  Visit Diagnosis:  Stiffness of joint, shoulder region, right - Plan: PT plan of care cert/re-cert  Back pain, chronic - Plan: PT plan of care cert/re-cert      Subjective Assessment - 01/12/15 0947    Symptoms "Dr. Donne Hazel thought I needed it on my right arm."  Also has back issues going way back.  Pt. knows to be careful about how she twists and stands and bends.   Pertinent History Right breast cancer diagnosed December 2015 with lumpectomy 11/28/14 with margins not clear; 01/01/15 had second lumpectomy and portacath placement.  Chemotherapy starts Monday 01/15/15; every three weeks, then will start radiation.  Will also take Arimidex.  Two lymph nodes removed, one positive.  h/o left breast cancer with mastectomy 1997 with 16 lymph nodes  removed.  Patient Stated Goals "I'm not exactly sure."  "Full ROM, I would imagine."   Currently in Pain? Yes  also soreness from right lumpectomy, left side Port, and h/o back pain   Pain Score 1    Pain Location Scapula   Pain Orientation Left   Aggravating Factors  turning wrong, picking up something, sit a certain way in a chair   Pain Relieving Factors Biofreeze and trigger point massage.          [facility] PT Assessment - 01/12/15 0001    Assessment   Medical Diagnosis right breast cancer   Onset Date 11/28/14   Precautions   Precautions Other (comment)  cancer precautions; h/o left ALND with mild swelling   Restrictions   Weight Bearing Restrictions No   Balance Screen   Has the patient fallen in the past 6 months No   Has the patient had a decrease in activity level because of a fear of falling?  No   Is the patient reluctant to leave their home because of a fear of falling?  No   Home  Environment   Living Enviornment Private residence   Living Arrangements Spouse/significant other   Type of [place] One level   Prior Function   Level of Independence Independent with basic ADLs;Independent with homemaking with ambulation;Independent with gait   Vocation Retired   Observation/Other Assessments   Skin Integrity Breast incision with steri strips in place; sentinel node incision healed but area feels firm beneath that skin.  Left chest Portacath incision healing well.   Other Surveys  Select   Quick DASH  14   Posture/Postural Control   Posture/Postural Control Postural limitations   Postural Limitations Rounded Shoulders;Forward head   AROM   Overall AROM Comments Right axillary cording palpable with patient raising right arm.   Right Shoulder Flexion 140 Degrees  palpable cording   Right Shoulder ABduction 164 Degrees  toward scaption; palpable cording   Right Shoulder Internal Rotation 50 Degrees   Right Shoulder External Rotation 80 Degrees   Left Shoulder Flexion 145 Degrees   Left Shoulder ABduction 164 Degrees  toward scaption   Left Shoulder Internal Rotation 77 Degrees   Left Shoulder External Rotation 80 Degrees           LYMPHEDEMA/ONCOLOGY QUESTIONNAIRE - 01/12/15 1012    Lymphedema Assessments   Lymphedema Assessments Upper extremities   Right Upper Extremity Lymphedema   10 cm Proximal to Olecranon Process 29.1 cm   Olecranon Process 25.1 cm   10 cm Proximal to Ulnar Styloid Process 22.5 cm   Just Proximal to Ulnar Styloid Process 16.4 cm   Across Hand at [employer] 19.5 cm   At [place] of 2nd Digit 6.8 cm   Left Upper Extremity Lymphedema   10 cm Proximal to Olecranon Process 30.2 cm   Olecranon Process 26.1 cm   10 cm Proximal to Ulnar Styloid Process 23.8 cm   Just Proximal to Ulnar Styloid Process 17 cm   Across Hand at [employer] 19.1 cm   At [place] of 2nd Digit 6.5 cm           Quick Dash - 01/12/15 0001     Open a tight or new jar Mild difficulty   Do heavy household chores (wash walls, wash floors) Mild difficulty   Carry a shopping bag or briefcase No difficulty   Wash your back No difficulty   Use a knife to cut  food No difficulty   Recreational activities in which you take some force or impact through your arm, shoulder, or hand (golf, hammering, tennis) No difficulty   During the past week, to what extent has your arm, shoulder or hand problem interfered with your normal social activities with family, friends, neighbors, or groups? Slightly   During the past week, to what extent has your arm, shoulder or hand problem limited your work or other regular daily activities Slightly   Arm, shoulder, or hand pain. Mild   Tingling (pins and needles) in your arm, shoulder, or hand None   Difficulty Sleeping Mild difficulty   DASH Score 13.64 %                    PT Education - Feb 07, 2015 1208    Education provided Yes   Education Details educated about risk of cellulitis, signs of and treatment for cellulitis, which could occur in either upper quadrant for her   Person(s) Educated Patient   Methods Explanation   Comprehension Verbalized understanding                [place] - 02-07-15 1211    CC Long Term Goal  #1   Title Pt. will be independent in home exercise program for right shoulder ROM and bilateral UE strengthening.   Time 4   Period Weeks   Status New   CC Long Term Goal  #2   Title Pt. will be independent in home exercise program for core strengthening and minimizing back pain.   Time 4   Period Weeks   Status New   CC Long Term Goal  #3   Title Pt. will report at least 50% perceived decrease in cording in right axilla.   Time 4   Period Weeks   Status New            Plan - 2015-02-07 1202    Clinical Impression Statement Patient with h/o left breast cancer with mastectomy and axillary node dissection in 1997 now with right breast  cancer with lumpectomy (x2) and sentinel node biopsy in December 2015 and Januarly 2016.  Will start chemotherapy and then have radiation.  Pt. has palpable cording in right axilla.  She is already knowledgeable about lymphedema and may have mild lymphddema in left UE that she reports has been stable.  Will benefit from therapy for right shoulder ROM, massage of cording, possible manual lymph drainage, and strength ABC program .  Also has chronic low back pain and would like assistance with exercise to minimize this.   Pt will benefit from skilled therapeutic intervention in order to improve on the following deficits Decreased range of motion;Decreased knowledge of precautions;Impaired UE functional use;Other (comment);Pain  axillary web syndrome right side   Rehab Potential Excellent   PT Frequency 2x / week   PT Duration 4 weeks   PT Treatment/Interventions Manual techniques;Passive range of motion;Manual lymph drainage;Therapeutic exercise;Patient/family education   PT Next Visit Plan Begin HEP instruction for right shoulder ROM and cording; manual techniques; begin education about safety of strengthening exercise.   PT Home Exercise Plan Will need HEP for right shoulder ROM, guidance about strengthening, including strength ABC info, and back exercise (core strengthening)   Consulted and Agree with Plan of Care Patient          G-Codes - 07-Feb-2015 1214    Functional Assessment Tool Used quick dash   Functional Limitation Carrying, moving and handling objects  Carrying, Moving and Handling Objects Current Status [phone]) At least 1 percent but less than 20 percent impaired, limited or restricted   Carrying, Moving and Handling Objects Goal Status ([id number]) At least 1 percent but less than 20 percent impaired, limited or restricted       Problem List Patient Active Problem List   Diagnosis Date Noted  . Family history of breast cancer   . Primary cancer of upper inner quadrant of right  female breast 11/17/2014  . Hypothyroidism 10/12/2007  . ALLERGIC RHINITIS 10/12/2007  . BREAST CANCER, HX OF 10/12/2007  . DIVERTICULITIS, HX OF 10/12/2007  . OSTEOARTHRITIS 09/09/2007    Quincee Gittens 01/12/2015, 12:19 PM  [place], [place], [address] Phone: [phone]   Fax:  [phone]  Addendum:  [facility]  In the mornings she does some stretching to help her low back pain, including knee to chest stretches.  [facility]  She would like guidance about how to help her chronic low back pain.  [place], [place]

## 2015-01-12 NOTE — Progress Notes (Signed)
Patient Care Team: Marletta Lor, MD as PCP - General Rolm Bookbinder, MD as Consulting Physician (General Surgery) Rulon Eisenmenger, MD as Consulting Physician (Hematology and Oncology) Thea Silversmith, MD as Consulting Physician (Radiation Oncology) Trinda Pascal, NP as Nurse Practitioner (Nurse Practitioner)  DIAGNOSIS: 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 breast   Staging form: Breast, AJCC 7th Edition     Clinical stage from 11/22/2014: Stage IA (T1b, N0, M0) - Unsigned     Pathologic: Stage IIA (T1c, N1a, cM0) - Signed by Seward Grater, MD on 12/26/2014       Staging comments: Staged on final lumpectomy specimen read by Dr. Saralyn Pilar.    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    Mammogram 9 mm mass at 1:00 position   11/15/2014 Initial Biopsy Right breast needle biopsy: Grade 2 invasive ductal carcinoma with DCIS with lymphovascular invasion ER/PR positive HER-2 negative Ki-67 20%   11/28/2014 Surgery right breast lumpectomy: 1.7 cm invasive ductal carcinoma with DCIS, focal vascular involvement, posterior margin involvement, 1/2 sentinel node positive, Mammaprint Luminal type, High Risk    CHIEF COMPLIANT: Cycle 1 Taxotere Cytoxan on 01/15/2015  INTERVAL HISTORY: Shawna Marquez is a 74 year old lady with above-mentioned history of recurrent breast cancer who underwent lumpectomy and was found to have high risk Mammaprint score and we recommended systemic chemotherapy. She will start Taxotere Cytoxan this coming Monday. She is here today to discuss a treatment plan and answer any questions. The port placement went well. She had previous section for margins which was negative.  REVIEW OF SYSTEMS:   Constitutional: Denies fevers, chills or abnormal weight loss Eyes: Denies blurriness of  vision Ears, nose, mouth, throat, and face: Denies mucositis or sore throat Respiratory: Denies cough, dyspnea or wheezes Cardiovascular: Denies palpitation, chest discomfort or lower extremity swelling Gastrointestinal:  Denies nausea, heartburn or change in bowel habits Skin: Denies abnormal skin rashes Lymphatics: Denies new lymphadenopathy or easy bruising Neurological:Denies numbness, tingling or new weaknesses Behavioral/Psych: Mood is stable, no new changes  Breast:  denies any pain or lumps or nodules in either breasts All other systems were reviewed with the patient and are negative.  I have reviewed the past medical history, past surgical history, social history and family history with the patient and they are unchanged from previous note.  ALLERGIES:  has No Known Allergies.  MEDICATIONS:  Current Outpatient Prescriptions  Medication Sig Dispense Refill  . aspirin EC 81 MG tablet Take 81 mg by mouth at bedtime.    . Calcium Citrate-Vitamin D (CALCIUM CITRATE + D3 PO) Take 1 tablet by mouth 2 (two) times daily. Calcium 500 mg, Vitamin D 800 units    . cholecalciferol (VITAMIN D) 1000 UNITS tablet Take 1,000 Units by mouth daily with lunch.     . Coenzyme Q10 (COQ10) 200 MG CAPS Take 200 mg by mouth every other day.    Marland Kitchen dexamethasone (DECADRON) 4 MG tablet Take 1 tablet (4 mg total) by mouth 2 (two) times daily. Start the day before Taxotere. Then again the day after chemo for 3 days. 30 tablet 1  . fluticasone (FLONASE) 50 MCG/ACT nasal spray USE 1 SPRAY IN EACH NOSTRIL TWICE DAILY 48 g 5  . folic acid (FOLVITE) 098 MCG tablet Take 400 mcg by mouth daily with lunch.     Marland Kitchen  hydrocortisone cream 1 % Apply 1 application topically daily as needed for itching (eczema).    Marland Kitchen levothyroxine (SYNTHROID, LEVOTHROID) 125 MCG tablet TAKE 1 TABLET BY MOUTH DAILY (Patient taking differently: Take 125 mcg by mouth daily before breakfast. TAKE 1 TABLET BY MOUTH DAILY) 90 tablet 3  .  lidocaine-prilocaine (EMLA) cream Apply to affected area once 30 g 3  . LORazepam (ATIVAN) 0.5 MG tablet Take 1 tablet (0.5 mg total) by mouth every 6 (six) hours as needed (Nausea or vomiting). 30 tablet 0  . Multiple Vitamin (MULTIVITAMIN WITH MINERALS) TABS tablet Take 1 tablet by mouth daily with lunch.    . Omega-3 Fatty Acids (FISH OIL) 1000 MG CAPS Take 1,000 mg by mouth daily with lunch.    . ondansetron (ZOFRAN) 8 MG tablet Take 1 tablet (8 mg total) by mouth 2 (two) times daily. Start the day after chemo for 3 days. Then take as needed for nausea or vomiting. 30 tablet 1  . oxyCODONE-acetaminophen (PERCOCET) 10-325 MG per tablet Take 1 tablet by mouth every 6 (six) hours as needed for pain. 20 tablet 0  . oxyCODONE-acetaminophen (PERCOCET) 10-325 MG per tablet Take 1 tablet by mouth every 6 (six) hours as needed for pain. 20 tablet 0  . prochlorperazine (COMPAZINE) 10 MG tablet Take 1 tablet (10 mg total) by mouth every 6 (six) hours as needed (Nausea or vomiting). 30 tablet 1  . Turmeric 450 MG CAPS Take 450 mg by mouth daily with lunch.     . vitamin C (ASCORBIC ACID) 500 MG tablet Take 500 mg by mouth 2 (two) times daily.     No current facility-administered medications for this visit.    PHYSICAL EXAMINATION: ECOG PERFORMANCE STATUS: 0 - Asymptomatic  Filed Vitals:   01/12/15 1203  BP: 146/86  Pulse: 74  Temp: 98.5 F (36.9 C)  Resp: 18   Filed Weights   01/12/15 1203  Weight: 171 lb 14.4 oz (77.973 kg)    GENERAL:alert, no distress and comfortable SKIN: skin color, texture, turgor are normal, no rashes or significant lesions EYES: normal, Conjunctiva are pink and non-injected, sclera clear OROPHARYNX:no exudate, no erythema and lips, buccal mucosa, and tongue normal  NECK: supple, thyroid normal size, non-tender, without nodularity LYMPH:  no palpable lymphadenopathy in the cervical, axillary or inguinal LUNGS: clear to auscultation and percussion with normal  breathing effort HEART: regular rate & rhythm and no murmurs and no lower extremity edema ABDOMEN:abdomen soft, non-tender and normal bowel sounds Musculoskeletal:no cyanosis of digits and no clubbing  NEURO: alert & oriented x 3 with fluent speech, no focal motor/sensory deficits  LABORATORY DATA:  I have reviewed the data as listed   Chemistry      Component Value Date/Time   NA 144 01/12/2015 1105   NA 142 12/28/2014 1533   K 3.0* 01/12/2015 1105   K 4.1 12/28/2014 1533   CL 104 12/28/2014 1533   CO2 20* 01/12/2015 1105   CO2 30 12/28/2014 1533   BUN 10.9 01/12/2015 1105   BUN 11 12/28/2014 1533   CREATININE 0.5* 01/12/2015 1105   CREATININE 0.76 12/28/2014 1533      Component Value Date/Time   CALCIUM 7.1* 01/12/2015 1105   CALCIUM 9.3 12/28/2014 1533   ALKPHOS 62 01/12/2015 1105   ALKPHOS 67 02/24/2014 1013   AST 17 01/12/2015 1105   AST 33 02/24/2014 1013   ALT 11 01/12/2015 1105   ALT 31 02/24/2014 1013   BILITOT 0.26 01/12/2015 1105  BILITOT 0.7 02/24/2014 1013       Lab Results  Component Value Date   WBC 5.3 01/12/2015   HGB 10.1* 01/12/2015   HCT 31.9* 01/12/2015   MCV 96.1 01/12/2015   PLT 220 01/12/2015   NEUTROABS 2.9 01/12/2015   ASSESSMENT & PLAN:  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 breast 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, focal vascular involvement, posterior margin involvement, 1/2 sentinel node positive, T1 cN1 M0 stage II a, minimal present luminal type B, high risk, disease free survival with chemotherapy and hormonal therapy 88% versus 76% with hormonal therapy alone  Treatment Plan: Adjuvant chemotherapy with Taxotere and Cytoxan every 3 weeks 4 cycles to start 01/15/15 Plan: 1. Patient had chemotherapy education and undergone port placement 2. I discussed antiemetic regimen in great detail and provided her with instructions 3. I reviewed her blood work and that appears to be a mistake in her  labs. She had previously normal lab tests. I would like to repeat it in order to verify the abnormalities noted on the blood tests 4. Start chemotherapy on 01/15/2015 5. Toxicity check on 01/22/2015 I will see her back with her cycle 2 of treatment.      Orders Placed This Encounter  Procedures  . CBC with Differential    Standing Status: Future     Number of Occurrences:      Standing Expiration Date: 01/12/2016  . Comprehensive metabolic panel (Cmet) - [facility]    Standing Status: Future     Number of Occurrences:      Standing Expiration Date: 01/12/2016   The patient has a good understanding of the overall plan. she agrees with it. She will call with any problems that may develop before her next visit here.   Rulon Eisenmenger, MD

## 2015-01-12 NOTE — Telephone Encounter (Signed)
, °

## 2015-01-12 NOTE — Therapy (Signed)
[place], [place], [address] Phone: [phone]   Fax:  [phone]  Physical Therapy Evaluation  Patient Details  Name: Shawna Marquez MRN: [id number] Date of Birth: [birth year] Referring Provider:  Rolm Bookbinder, MD  Encounter Date: 01/12/2015      PT End of Session - 01/12/15 1201    Visit Number 1   Number of Visits 9   Date for PT Re-Evaluation 02/23/15   PT Start Time 0944   PT Stop Time 1023   PT Time Calculation (min) 39 min      Past Medical History  Diagnosis Date  . ALLERGIC RHINITIS 10/12/2007  . BREAST CANCER, HX OF 10/12/2007    right breast diagnosed - 11/2014  . DIVERTICULITIS, HX OF 10/12/2007  . HYPOTHYROIDISM, PRIMARY 10/12/2007  . 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 Wears glasses   . Family history of breast cancer   . Complication of anesthesia     bp will drop,hard to wake up  . Sleep apnea     USES cpap NIGHTLY  . OSTEOARTHRITIS 09/09/2007    back     Past Surgical History  Procedure Laterality Date  . Colovaginal fistula  2007    colon resection  . Abdominal hysterectomy  1977  . Hernia repair  2007    ventral  . Colon surgery  2006    part colectomy  . Mastectomy  1997    lt mast-axillary node dissection  . Tonsillectomy    . Colonoscopy  2007  . Wisdom tooth extraction    . Eye surgery      both cataracts  . Radioactive seed guided mastectomy with axillary sentinel lymph node biopsy Right 11/28/2014    Procedure: RADIOACTIVE SEED GUIDED RIGHT LUMPECTOMY WITH RIGHT AXILLARY SENTINEL LYMPH NODE BIOPSY;  Surgeon: Rolm Bookbinder, MD;  Location: [facility];  Service: General;  Laterality: Right;  . Re-excision of breast cancer,superior margins Right 01/01/2015    Procedure: RIGHT BREAST MARGIN EXCISION;  Surgeon: Rolm Bookbinder, MD;  Location: [facility];  Service: General;  Laterality: Right;  . Portacath placement  Left 01/01/2015    Procedure: INSERTION PORT-A-CATH;  Surgeon: Rolm Bookbinder, MD;  Location: [facility];  Service: General;  Laterality: Left;    There were no vitals taken for this visit.  Visit Diagnosis:  Stiffness of joint, shoulder region, right - Plan: PT plan of care cert/re-cert  Back pain, chronic - Plan: PT plan of care cert/re-cert      Subjective Assessment - 01/12/15 0947    Symptoms "Dr. Donne Hazel thought I needed it on my right arm."  Also has back issues going way back.  Pt. knows to be careful about how she twists and stands and bends.   Pertinent History Right breast cancer diagnosed December 2015 with lumpectomy 11/28/14 with margins not clear; 01/01/15 had second lumpectomy and portacath placement.  Chemotherapy starts Monday 01/15/15; every three weeks, then will start radiation.  Will also take Arimidex.  Two lymph nodes removed, one positive.  h/o left breast cancer with mastectomy 1997 with 16 lymph nodes  removed.  Patient Stated Goals "I'm not exactly sure."  "Full ROM, I would imagine."   Currently in Pain? Yes  also soreness from right lumpectomy, left side Port, and h/o back pain   Pain Score 1    Pain Location Scapula   Pain Orientation Left   Aggravating Factors  turning wrong, picking up something, sit a certain way in a chair   Pain Relieving Factors Biofreeze and trigger point massage.          [facility] PT Assessment - 01/12/15 0001    Assessment   Medical Diagnosis right breast cancer   Onset Date 11/28/14   Precautions   Precautions Other (comment)  cancer precautions; h/o left ALND with mild swelling   Restrictions   Weight Bearing Restrictions No   Balance Screen   Has the patient fallen in the past 6 months No   Has the patient had a decrease in activity level because of a fear of falling?  No   Is the patient reluctant to leave their home because of a fear of falling?  No   Home  Environment   Living Enviornment Private residence   Living Arrangements Spouse/significant other   Type of [place] One level   Prior Function   Level of Independence Independent with basic ADLs;Independent with homemaking with ambulation;Independent with gait   Vocation Retired   Observation/Other Assessments   Skin Integrity Breast incision with steri strips in place; sentinel node incision healed but area feels firm beneath that skin.  Left chest Portacath incision healing well.   Other Surveys  Select   Quick DASH  14   Posture/Postural Control   Posture/Postural Control Postural limitations   Postural Limitations Rounded Shoulders;Forward head   AROM   Overall AROM Comments Right axillary cording palpable with patient raising right arm.   Right Shoulder Flexion 140 Degrees  palpable cording   Right Shoulder ABduction 164 Degrees  toward scaption; palpable cording   Right Shoulder Internal Rotation 50 Degrees   Right Shoulder External Rotation 80 Degrees   Left Shoulder Flexion 145 Degrees   Left Shoulder ABduction 164 Degrees  toward scaption   Left Shoulder Internal Rotation 77 Degrees   Left Shoulder External Rotation 80 Degrees           LYMPHEDEMA/ONCOLOGY QUESTIONNAIRE - 01/12/15 1012    Lymphedema Assessments   Lymphedema Assessments Upper extremities   Right Upper Extremity Lymphedema   10 cm Proximal to Olecranon Process 29.1 cm   Olecranon Process 25.1 cm   10 cm Proximal to Ulnar Styloid Process 22.5 cm   Just Proximal to Ulnar Styloid Process 16.4 cm   Across Hand at [employer] 19.5 cm   At [place] of 2nd Digit 6.8 cm   Left Upper Extremity Lymphedema   10 cm Proximal to Olecranon Process 30.2 cm   Olecranon Process 26.1 cm   10 cm Proximal to Ulnar Styloid Process 23.8 cm   Just Proximal to Ulnar Styloid Process 17 cm   Across Hand at [employer] 19.1 cm   At [place] of 2nd Digit 6.5 cm           Quick Dash - 01/12/15 0001     Open a tight or new jar Mild difficulty   Do heavy household chores (wash walls, wash floors) Mild difficulty   Carry a shopping bag or briefcase No difficulty   Wash your back No difficulty   Use a knife to cut  food No difficulty   Recreational activities in which you take some force or impact through your arm, shoulder, or hand (golf, hammering, tennis) No difficulty   During the past week, to what extent has your arm, shoulder or hand problem interfered with your normal social activities with family, friends, neighbors, or groups? Slightly   During the past week, to what extent has your arm, shoulder or hand problem limited your work or other regular daily activities Slightly   Arm, shoulder, or hand pain. Mild   Tingling (pins and needles) in your arm, shoulder, or hand None   Difficulty Sleeping Mild difficulty   DASH Score 13.64 %                    PT Education - February 07, 2015 1208    Education provided Yes   Education Details educated about risk of cellulitis, signs of and treatment for cellulitis, which could occur in either upper quadrant for her   Person(s) Educated Patient   Methods Explanation   Comprehension Verbalized understanding                [place] - 07-Feb-2015 1211    CC Long Term Goal  #1   Title Pt. will be independent in home exercise program for right shoulder ROM and bilateral UE strengthening.   Time 4   Period Weeks   Status New   CC Long Term Goal  #2   Title Pt. will be independent in home exercise program for core strengthening and minimizing back pain.   Time 4   Period Weeks   Status New   CC Long Term Goal  #3   Title Pt. will report at least 50% perceived decrease in cording in right axilla.   Time 4   Period Weeks   Status New            Plan - 2015/02/07 1202    Clinical Impression Statement Patient with h/o left breast cancer with mastectomy and axillary node dissection in 1997 now with right breast  cancer with lumpectomy (x2) and sentinel node biopsy in December 2015 and Januarly 2016.  Will start chemotherapy and then have radiation.  Pt. has palpable cording in right axilla.  She is already knowledgeable about lymphedema and may have mild lymphddema in left UE that she reports has been stable.  Will benefit from therapy for right shoulder ROM, massage of cording, possible manual lymph drainage, and strength ABC program .  Also has chronic low back pain and would like assistance with exercise to minimize this.   Pt will benefit from skilled therapeutic intervention in order to improve on the following deficits Decreased range of motion;Decreased knowledge of precautions;Impaired UE functional use;Other (comment);Pain  axillary web syndrome right side   Rehab Potential Excellent   PT Frequency 2x / week   PT Duration 4 weeks   PT Treatment/Interventions Manual techniques;Passive range of motion;Manual lymph drainage;Therapeutic exercise;Patient/family education   PT Next Visit Plan Begin HEP instruction for right shoulder ROM and cording; manual techniques; begin education about safety of strengthening exercise.   PT Home Exercise Plan Will need HEP for right shoulder ROM, guidance about strengthening, including strength ABC info, and back exercise (core strengthening)   Consulted and Agree with Plan of Care Patient          G-Codes - 2015/02/07 1214    Functional Assessment Tool Used quick dash   Functional Limitation Carrying, moving and handling objects  Carrying, Moving and Handling Objects Current Status [id number]) At least 1 percent but less than 20 percent impaired, limited or restricted   Carrying, Moving and Handling Objects Goal Status ([id number]) At least 1 percent but less than 20 percent impaired, limited or restricted       Problem List Patient Active Problem List   Diagnosis Date Noted  . Family history of breast cancer   . Primary cancer of upper inner quadrant of right  female breast 11/17/2014  . Hypothyroidism 10/12/2007  . ALLERGIC RHINITIS 10/12/2007  . BREAST CANCER, HX OF 10/12/2007  . DIVERTICULITIS, HX OF 10/12/2007  . OSTEOARTHRITIS 09/09/2007    SALISBURY,DONNA 01/12/2015, 12:16 PM  [place], [place], [address] Phone: [phone]   Fax:  [phone]  [name], [place]

## 2015-01-12 NOTE — Assessment & Plan Note (Signed)
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, focal vascular involvement, posterior margin involvement, 1/2 sentinel node positive, T1 cN1 M0 stage II a, minimal present luminal type B, high risk, disease free survival with chemotherapy and hormonal therapy 88% versus 76% with hormonal therapy alone  Treatment Plan: Adjuvant chemotherapy with Taxotere and Cytoxan every 3 weeks 4 cycles to start 01/15/15 Plan: 1. Patient had chemotherapy education and undergone port placement 2. I discussed antiemetic regimen in great detail and provided her with instructions 3. I reviewed her blood work and that appears to be a mistake in her labs. She had previously normal lab tests. I would like to repeat it in order to verify the abnormalities noted on the blood tests 4. Start chemotherapy on 01/15/2015 5. Toxicity check on 01/22/2015 I will see her back with her cycle 2 of treatment.

## 2015-01-13 LAB — T4, FREE: FREE T4: 1.36 ng/dL (ref 0.80–1.80)

## 2015-01-13 LAB — T3, FREE: T3, Free: 2.7 pg/mL (ref 2.3–4.2)

## 2015-01-15 ENCOUNTER — Other Ambulatory Visit: Payer: Self-pay | Admitting: *Deleted

## 2015-01-15 ENCOUNTER — Telehealth: Payer: Self-pay | Admitting: Oncology

## 2015-01-15 ENCOUNTER — Ambulatory Visit (HOSPITAL_BASED_OUTPATIENT_CLINIC_OR_DEPARTMENT_OTHER): Payer: Medicare Other

## 2015-01-15 DIAGNOSIS — C773 Secondary and unspecified malignant neoplasm of axilla and upper limb lymph nodes: Secondary | ICD-10-CM

## 2015-01-15 DIAGNOSIS — Z5111 Encounter for antineoplastic chemotherapy: Secondary | ICD-10-CM | POA: Diagnosis not present

## 2015-01-15 DIAGNOSIS — C50211 Malignant neoplasm of upper-inner quadrant of right female breast: Secondary | ICD-10-CM | POA: Diagnosis not present

## 2015-01-15 MED ORDER — SODIUM CHLORIDE 0.9 % IV SOLN
Freq: Once | INTRAVENOUS | Status: AC
  Administered 2015-01-15: 12:00:00 via INTRAVENOUS

## 2015-01-15 MED ORDER — ONDANSETRON 16 MG/50ML IVPB (CHCC)
16.0000 mg | Freq: Once | INTRAVENOUS | Status: AC
  Administered 2015-01-15: 16 mg via INTRAVENOUS

## 2015-01-15 MED ORDER — HEPARIN SOD (PORK) LOCK FLUSH 100 UNIT/ML IV SOLN
500.0000 [IU] | Freq: Once | INTRAVENOUS | Status: AC | PRN
  Administered 2015-01-15: 500 [IU]
  Filled 2015-01-15: qty 5

## 2015-01-15 MED ORDER — DEXAMETHASONE SODIUM PHOSPHATE 10 MG/ML IJ SOLN
INTRAMUSCULAR | Status: AC
  Filled 2015-01-15: qty 1

## 2015-01-15 MED ORDER — ONDANSETRON 16 MG/50ML IVPB (CHCC)
INTRAVENOUS | Status: AC
  Filled 2015-01-15: qty 16

## 2015-01-15 MED ORDER — SODIUM CHLORIDE 0.9 % IV SOLN
600.0000 mg/m2 | Freq: Once | INTRAVENOUS | Status: AC
  Administered 2015-01-15: 1120 mg via INTRAVENOUS
  Filled 2015-01-15: qty 56

## 2015-01-15 MED ORDER — DOCETAXEL CHEMO INJECTION 160 MG/16ML
75.0000 mg/m2 | Freq: Once | INTRAVENOUS | Status: AC
  Administered 2015-01-15: 140 mg via INTRAVENOUS
  Filled 2015-01-15: qty 14

## 2015-01-15 MED ORDER — DEXAMETHASONE SODIUM PHOSPHATE 10 MG/ML IJ SOLN
10.0000 mg | Freq: Once | INTRAMUSCULAR | Status: AC
  Administered 2015-01-15: 10 mg via INTRAVENOUS

## 2015-01-15 MED ORDER — SODIUM CHLORIDE 0.9 % IJ SOLN
10.0000 mL | INTRAMUSCULAR | Status: DC | PRN
  Administered 2015-01-15: 10 mL
  Filled 2015-01-15: qty 10

## 2015-01-15 NOTE — Telephone Encounter (Signed)
per pof to r/s pt appt-w/KC-r/s & cald & gave pt time & date of appt

## 2015-01-15 NOTE — Patient Instructions (Signed)
Shawna Marquez Discharge Instructions for Patients Receiving Chemotherapy  Today you received the following chemotherapy agents:  Taxotere and Cytoxan  To help prevent nausea and vomiting after your treatment, we encourage you to take your nausea medication as ordered per MD.   If you develop nausea and vomiting that is not controlled by your nausea medication, call the clinic.   BELOW ARE SYMPTOMS THAT SHOULD BE REPORTED IMMEDIATELY:  *FEVER GREATER THAN 100.5 F  *CHILLS WITH OR WITHOUT FEVER  NAUSEA AND VOMITING THAT IS NOT CONTROLLED WITH YOUR NAUSEA MEDICATION  *UNUSUAL SHORTNESS OF BREATH  *UNUSUAL BRUISING OR BLEEDING  TENDERNESS IN MOUTH AND THROAT WITH OR WITHOUT PRESENCE OF ULCERS  *URINARY PROBLEMS  *BOWEL PROBLEMS  UNUSUAL RASH Items with * indicate a potential emergency and should be followed up as soon as possible.  Feel free to call the clinic you have any questions or concerns. The clinic phone number is [phone].

## 2015-01-16 ENCOUNTER — Ambulatory Visit (HOSPITAL_BASED_OUTPATIENT_CLINIC_OR_DEPARTMENT_OTHER): Payer: Medicare Other

## 2015-01-16 DIAGNOSIS — C50211 Malignant neoplasm of upper-inner quadrant of right female breast: Secondary | ICD-10-CM | POA: Diagnosis not present

## 2015-01-16 DIAGNOSIS — C773 Secondary and unspecified malignant neoplasm of axilla and upper limb lymph nodes: Secondary | ICD-10-CM | POA: Diagnosis not present

## 2015-01-16 MED ORDER — PEGFILGRASTIM INJECTION 6 MG/0.6ML ~~LOC~~
6.0000 mg | PREFILLED_SYRINGE | Freq: Once | SUBCUTANEOUS | Status: AC
  Administered 2015-01-16: 6 mg via SUBCUTANEOUS
  Filled 2015-01-16: qty 0.6

## 2015-01-19 ENCOUNTER — Other Ambulatory Visit: Payer: Self-pay | Admitting: Emergency Medicine

## 2015-01-19 ENCOUNTER — Ambulatory Visit: Payer: Medicare Other | Admitting: Physical Therapy

## 2015-01-19 DIAGNOSIS — M25611 Stiffness of right shoulder, not elsewhere classified: Secondary | ICD-10-CM

## 2015-01-19 DIAGNOSIS — G8929 Other chronic pain: Secondary | ICD-10-CM | POA: Diagnosis not present

## 2015-01-19 DIAGNOSIS — M549 Dorsalgia, unspecified: Secondary | ICD-10-CM | POA: Diagnosis not present

## 2015-01-19 NOTE — Therapy (Signed)
[place], [place], [address] Phone: [phone]   Fax:  [phone]  Physical Therapy Treatment  Patient Details  Name: Shawna Marquez MRN: [id number] Date of Birth: [birth year] Referring Provider:  Marletta Lor, MD  Encounter Date: 01/19/2015      PT End of Session - 01/19/15 1155    Visit Number 2   Number of Visits 9   Date for PT Re-Evaluation 02/23/15   PT Start Time 1110   PT Stop Time 1150   PT Time Calculation (min) 40 min   Activity Tolerance Patient tolerated treatment well   Behavior During Therapy [facility] for tasks assessed/performed      Past Medical History  Diagnosis Date  . ALLERGIC RHINITIS 10/12/2007  . BREAST CANCER, HX OF 10/12/2007    right breast diagnosed - 11/2014  . DIVERTICULITIS, HX OF 10/12/2007  . HYPOTHYROIDISM, PRIMARY 10/12/2007  . 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 Wears glasses   . Family history of breast cancer   . Complication of anesthesia     bp will drop,hard to wake up  . Sleep apnea     USES cpap NIGHTLY  . OSTEOARTHRITIS 09/09/2007    back     Past Surgical History  Procedure Laterality Date  . Colovaginal fistula  2007    colon resection  . Abdominal hysterectomy  1977  . Hernia repair  2007    ventral  . Colon surgery  2006    part colectomy  . Mastectomy  1997    lt mast-axillary node dissection  . Tonsillectomy    . Colonoscopy  2007  . Wisdom tooth extraction    . Eye surgery      both cataracts  . Radioactive seed guided mastectomy with axillary sentinel lymph node biopsy Right 11/28/2014    Procedure: RADIOACTIVE SEED GUIDED RIGHT LUMPECTOMY WITH RIGHT AXILLARY SENTINEL LYMPH NODE BIOPSY;  Surgeon: Rolm Bookbinder, MD;  Location: [facility];  Service: General;  Laterality: Right;  . Re-excision of breast cancer,superior margins Right 01/01/2015    Procedure: RIGHT BREAST MARGIN EXCISION;   Surgeon: Rolm Bookbinder, MD;  Location: [facility];  Service: General;  Laterality: Right;  . Portacath placement Left 01/01/2015    Procedure: INSERTION PORT-A-CATH;  Surgeon: Rolm Bookbinder, MD;  Location: [facility];  Service: General;  Laterality: Left;    There were no vitals taken for this visit.  Visit Diagnosis:  Stiffness of joint, shoulder region, right      Subjective Assessment - 01/19/15 1111    Symptoms Knot under the left shoulder blade that she thinks is from surgery.  "my back hurt constantly" She says it is better, but she can sitll feel it Overall feels she is getting better   Currently in Pain? Yes  from the neulasta shot.  You get bone pain   Pain Score 2    Pain Location Shoulder   Pain Orientation Right;Left   Pain Radiating Towards to upper arms.                     [name] Adult PT Treatment/Exercise - 01/19/15 1217    Neck Exercises: Seated   Neck Retraction 5 reps   Cervical Rotation Both;5 reps   Lateral Flexion Both  2 reps   Shoulder Rolls Backwards;5 reps   Other Seated Exercise neural glide with head rotation   Manual Therapy   Myofascial Release to cording  at axilla   Manual Lymphatic Drainage (MLD) short neck to right side (avoided left side due to healing port site; superficial and deep abdominals. right inguinals, right axillo-inguino anastamosis, right anterion and lateral chest, axilla, upper and to hand with return along pathways and support to cording   Passive ROM to right shoudler    Neck Exercises: Stretches   Lower Cervical/Upper Thoracic Stretch 3 reps  extension   Other Neck Stretches upper thoracic rotation 3 reps   Other Neck Stretches seated modified cobra                PT Education - 01/19/15 1154    Education provided Yes   Education Details wearing tg soft for symptomatic relief   Person(s) Educated Patient   Methods Explanation   Comprehension Verbalized understanding                [place] - 01/19/15 1216    CC Long Term Goal  #1   Title Pt. will be independent in home exercise program for right shoulder ROM and bilateral UE strengthening.   Time 4   Period Weeks   Status On-going   CC Long Term Goal  #2   Title Pt. will be independent in home exercise program for core strengthening and minimizing back pain.   Time 4   Period Weeks   CC Long Term Goal  #3   Title Pt. will report at least 50% perceived decrease in cording in right axilla.   Time 4   Period Weeks   Status On-going            Plan - 01/19/15 1223    Clinical Impression Statement thick cording visible in axilla with guitar sgreing cording in upper medial arm. Pt did well with AROM and manual lymph drainage.  Provided 2 1/4 inch foam patches covered with Tg soft for right underarm and left port site padding under bra and tg soft medium to right upper arm for lymphatic support   PT Next Visit Plan Assess cording and relief with manual lymph driainage, continue with manual techniques and AROM   PT Home Exercise Plan Will need HEP for right shoulder ROM, guidance about strengthening, including strength ABC info, and back exercise (core strengthening)        Problem List Patient Active Problem List   Diagnosis Date Noted  . Family history of breast cancer   . 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 breast 11/17/2014  . Hypothyroidism 10/12/2007  . ALLERGIC RHINITIS 10/12/2007  . BREAST CANCER, HX OF 10/12/2007  . DIVERTICULITIS, HX OF 10/12/2007  . OSTEOARTHRITIS 09/09/2007   Donato Heinz. Owens Shark, PT   01/19/2015, 12:26 PM  [place], [place], [address] Phone: [phone]   Fax:  [phone]

## 2015-01-19 NOTE — Patient Instructions (Signed)
Wear Tg soft on arm for symptomatic relief.  Take it off if is feels uncomfortable.  Ok to sleep in it.

## 2015-01-22 ENCOUNTER — Other Ambulatory Visit (HOSPITAL_BASED_OUTPATIENT_CLINIC_OR_DEPARTMENT_OTHER): Payer: Medicare Other

## 2015-01-22 ENCOUNTER — Ambulatory Visit (HOSPITAL_BASED_OUTPATIENT_CLINIC_OR_DEPARTMENT_OTHER): Payer: Medicare Other

## 2015-01-22 ENCOUNTER — Telehealth: Payer: Self-pay | Admitting: *Deleted

## 2015-01-22 ENCOUNTER — Ambulatory Visit (HOSPITAL_BASED_OUTPATIENT_CLINIC_OR_DEPARTMENT_OTHER): Payer: Medicare Other | Admitting: Oncology

## 2015-01-22 ENCOUNTER — Ambulatory Visit: Payer: Medicare Other | Admitting: Nurse Practitioner

## 2015-01-22 ENCOUNTER — Other Ambulatory Visit: Payer: Medicare Other

## 2015-01-22 ENCOUNTER — Telehealth: Payer: Self-pay | Admitting: Oncology

## 2015-01-22 ENCOUNTER — Encounter: Payer: Self-pay | Admitting: Oncology

## 2015-01-22 VITALS — BP 110/61 | HR 100 | Temp 99.5°F | Resp 18 | Ht 61.0 in | Wt 170.2 lb

## 2015-01-22 VITALS — BP 109/57 | HR 95 | Temp 98.6°F

## 2015-01-22 DIAGNOSIS — Z452 Encounter for adjustment and management of vascular access device: Secondary | ICD-10-CM

## 2015-01-22 DIAGNOSIS — C50211 Malignant neoplasm of upper-inner quadrant of right female breast: Secondary | ICD-10-CM

## 2015-01-22 DIAGNOSIS — Z95828 Presence of other vascular implants and grafts: Secondary | ICD-10-CM

## 2015-01-22 DIAGNOSIS — E039 Hypothyroidism, unspecified: Secondary | ICD-10-CM

## 2015-01-22 DIAGNOSIS — C50219 Malignant neoplasm of upper-inner quadrant of unspecified female breast: Secondary | ICD-10-CM | POA: Diagnosis not present

## 2015-01-22 DIAGNOSIS — E038 Other specified hypothyroidism: Secondary | ICD-10-CM | POA: Diagnosis not present

## 2015-01-22 LAB — CBC WITH DIFFERENTIAL/PLATELET
BASO%: 1.3 % (ref 0.0–2.0)
Basophils Absolute: 0.2 10*3/uL — ABNORMAL HIGH (ref 0.0–0.1)
EOS ABS: 0.3 10*3/uL (ref 0.0–0.5)
EOS%: 1.6 % (ref 0.0–7.0)
HCT: 37.7 % (ref 34.8–46.6)
HEMOGLOBIN: 11.9 g/dL (ref 11.6–15.9)
LYMPH#: 2.1 10*3/uL (ref 0.9–3.3)
LYMPH%: 13 % — AB (ref 14.0–49.7)
MCH: 29.6 pg (ref 25.1–34.0)
MCHC: 31.5 g/dL (ref 31.5–36.0)
MCV: 94 fL (ref 79.5–101.0)
MONO#: 2.1 10*3/uL — ABNORMAL HIGH (ref 0.1–0.9)
MONO%: 12.4 % (ref 0.0–14.0)
NEUT#: 11.8 10*3/uL — ABNORMAL HIGH (ref 1.5–6.5)
NEUT%: 71.7 % (ref 38.4–76.8)
Platelets: 224 10*3/uL (ref 145–400)
RBC: 4.01 10*6/uL (ref 3.70–5.45)
RDW: 12.9 % (ref 11.2–14.5)
WBC: 16.5 10*3/uL — AB (ref 3.9–10.3)

## 2015-01-22 LAB — COMPREHENSIVE METABOLIC PANEL (CC13)
ALK PHOS: 81 U/L (ref 40–150)
ALT: 17 U/L (ref 0–55)
ANION GAP: 9 meq/L (ref 3–11)
AST: 21 U/L (ref 5–34)
Albumin: 3.3 g/dL — ABNORMAL LOW (ref 3.5–5.0)
BILIRUBIN TOTAL: 0.23 mg/dL (ref 0.20–1.20)
BUN: 6.3 mg/dL — AB (ref 7.0–26.0)
CHLORIDE: 103 meq/L (ref 98–109)
CO2: 25 mEq/L (ref 22–29)
CREATININE: 0.7 mg/dL (ref 0.6–1.1)
Calcium: 9.1 mg/dL (ref 8.4–10.4)
EGFR: 85 mL/min/{1.73_m2} — ABNORMAL LOW (ref >90)
Glucose: 114 mg/dl (ref 70–140)
POTASSIUM: 4 meq/L (ref 3.5–5.1)
SODIUM: 137 meq/L (ref 136–145)
TOTAL PROTEIN: 6.3 g/dL — AB (ref 6.4–8.3)

## 2015-01-22 LAB — TSH CHCC: TSH: 1.539 m(IU)/L (ref 0.308–3.960)

## 2015-01-22 LAB — T3, FREE: T3, Free: 2.3 pg/mL (ref 2.3–4.2)

## 2015-01-22 LAB — T4, FREE: Free T4: 1.12 ng/dL (ref 0.80–1.80)

## 2015-01-22 MED ORDER — SODIUM CHLORIDE 0.9 % IJ SOLN
10.0000 mL | INTRAMUSCULAR | Status: DC | PRN
  Administered 2015-01-22: 10 mL via INTRAVENOUS
  Filled 2015-01-22: qty 10

## 2015-01-22 MED ORDER — HEPARIN SOD (PORK) LOCK FLUSH 100 UNIT/ML IV SOLN
500.0000 [IU] | Freq: Once | INTRAVENOUS | Status: AC
  Administered 2015-01-22: 500 [IU] via INTRAVENOUS
  Filled 2015-01-22: qty 5

## 2015-01-22 NOTE — Progress Notes (Signed)
Patient Care Team: Marletta Lor, MD as PCP - General Rolm Bookbinder, MD as Consulting Physician (General Surgery) Rulon Eisenmenger, MD as Consulting Physician (Hematology and Oncology) Thea Silversmith, MD as Consulting Physician (Radiation Oncology) Trinda Pascal, NP as Nurse Practitioner (Nurse Practitioner)  DIAGNOSIS: 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 breast   Staging form: Breast, AJCC 7th Edition     Clinical stage from 11/22/2014: Stage IA (T1b, N0, M0) - Unsigned     Pathologic: Stage IIA (T1c, N1a, cM0) - Signed by Seward Grater, MD on 12/26/2014       Staging comments: Staged on final lumpectomy specimen read by Dr. Saralyn Pilar.    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    Mammogram 9 mm mass at 1:00 position   11/15/2014 Initial Biopsy Right breast needle biopsy: Grade 2 invasive ductal carcinoma with DCIS with lymphovascular invasion ER/PR positive HER-2 negative Ki-67 20%   11/28/2014 Surgery right breast lumpectomy: 1.7 cm invasive ductal carcinoma with DCIS, focal vascular involvement, posterior margin involvement, 1/2 sentinel node positive, Mammaprint Luminal type, High Risk    CHIEF COMPLIANT: S/P Cycle 1 Taxotere Cytoxan given on 01/15/2015  INTERVAL HISTORY: Shawna Marquez is a 74 year old lady with above-mentioned history of recurrent breast cancer who underwent lumpectomy and was found to have high risk Mammaprint score and we recommended systemic chemotherapy. She started chemotherapy last week. Overall tolerated this well. Reports mild fatigue. Had very minor bone pain due to Neulasta. Tool Tylenol with relief.  REVIEW OF SYSTEMS:   Constitutional: Denies fevers, chills or abnormal weight loss Eyes: Denies blurriness of vision Ears, nose, mouth, throat, and face:  Denies mucositis or sore throat Respiratory: Denies cough, dyspnea or wheezes Cardiovascular: Denies palpitation, chest discomfort or lower extremity swelling Gastrointestinal:  Denies nausea, heartburn or change in bowel habits Skin: Denies abnormal skin rashes Lymphatics: Denies new lymphadenopathy or easy bruising Neurological:Denies numbness, tingling or new weaknesses Behavioral/Psych: Mood is stable, no new changes  Breast:  denies any pain or lumps or nodules in either breasts All other systems were reviewed with the patient and are negative.  I have reviewed the past medical history, past surgical history, social history and family history with the patient and they are unchanged from previous note.  ALLERGIES:  has No Known Allergies.  MEDICATIONS:  Current Outpatient Prescriptions  Medication Sig Dispense Refill  . aspirin EC 81 MG tablet Take 81 mg by mouth at bedtime.    . Calcium Citrate-Vitamin D (CALCIUM CITRATE + D3 PO) Take 1 tablet by mouth 2 (two) times daily. Calcium 500 mg, Vitamin D 800 units    . cholecalciferol (VITAMIN D) 1000 UNITS tablet Take 1,000 Units by mouth daily with lunch.     . Coenzyme Q10 (COQ10) 200 MG CAPS Take 200 mg by mouth every other day.    Marland Kitchen dexamethasone (DECADRON) 4 MG tablet Take 1 tablet (4 mg total) by mouth 2 (two) times daily. Start the day before Taxotere. Then again the day after chemo for 3 days. 30 tablet 1  . fluticasone (FLONASE) 50 MCG/ACT nasal spray USE 1 SPRAY IN EACH NOSTRIL TWICE DAILY 48 g 5  . folic acid (FOLVITE) 720 MCG tablet Take 400 mcg by mouth daily with lunch.     . hydrocortisone cream 1 % Apply 1  application topically daily as needed for itching (eczema).    Marland Kitchen levothyroxine (SYNTHROID, LEVOTHROID) 125 MCG tablet TAKE 1 TABLET BY MOUTH DAILY (Patient taking differently: Take 125 mcg by mouth daily before breakfast. TAKE 1 TABLET BY MOUTH DAILY) 90 tablet 3  . lidocaine-prilocaine (EMLA) cream Apply to affected area  once 30 g 3  . LORazepam (ATIVAN) 0.5 MG tablet Take 1 tablet (0.5 mg total) by mouth every 6 (six) hours as needed (Nausea or vomiting). 30 tablet 0  . Multiple Vitamin (MULTIVITAMIN WITH MINERALS) TABS tablet Take 1 tablet by mouth daily with lunch.    . Omega-3 Fatty Acids (FISH OIL) 1000 MG CAPS Take 1,000 mg by mouth daily with lunch.    . ondansetron (ZOFRAN) 8 MG tablet Take 1 tablet (8 mg total) by mouth 2 (two) times daily. Start the day after chemo for 3 days. Then take as needed for nausea or vomiting. 30 tablet 1  . oxyCODONE-acetaminophen (PERCOCET) 10-325 MG per tablet Take 1 tablet by mouth every 6 (six) hours as needed for pain. 20 tablet 0  . oxyCODONE-acetaminophen (PERCOCET) 10-325 MG per tablet Take 1 tablet by mouth every 6 (six) hours as needed for pain. 20 tablet 0  . prochlorperazine (COMPAZINE) 10 MG tablet Take 1 tablet (10 mg total) by mouth every 6 (six) hours as needed (Nausea or vomiting). 30 tablet 1  . Turmeric 450 MG CAPS Take 450 mg by mouth daily with lunch.     . vitamin C (ASCORBIC ACID) 500 MG tablet Take 500 mg by mouth 2 (two) times daily.     No current facility-administered medications for this visit.    PHYSICAL EXAMINATION: ECOG PERFORMANCE STATUS: 0 - Asymptomatic  Filed Vitals:   01/22/15 1131  BP: 110/61  Pulse: 100  Temp: 99.5 F (37.5 C)  Resp: 18   Filed Weights   01/22/15 1131  Weight: 170 lb 3.2 oz (77.202 kg)    GENERAL:alert, no distress and comfortable SKIN: skin color, texture, turgor are normal, no rashes or significant lesions EYES: normal, Conjunctiva are pink and non-injected, sclera clear OROPHARYNX:no exudate, no erythema and lips, buccal mucosa, and tongue normal  NECK: supple, thyroid normal size, non-tender, without nodularity LYMPH:  no palpable lymphadenopathy in the cervical, axillary or inguinal LUNGS: clear to auscultation and percussion with normal breathing effort HEART: regular rate & rhythm and no murmurs  and no lower extremity edema ABDOMEN:abdomen soft, non-tender and normal bowel sounds Musculoskeletal:no cyanosis of digits and no clubbing  NEURO: alert & oriented x 3 with fluent speech, no focal motor/sensory deficits  LABORATORY DATA:  I have reviewed the data as listed   Chemistry      Component Value Date/Time   NA 137 01/22/2015 1040   NA 142 12/28/2014 1533   K 4.0 01/22/2015 1040   K 4.1 12/28/2014 1533   CL 104 12/28/2014 1533   CO2 25 01/22/2015 1040   CO2 30 12/28/2014 1533   BUN 6.3* 01/22/2015 1040   BUN 11 12/28/2014 1533   CREATININE 0.7 01/22/2015 1040   CREATININE 0.76 12/28/2014 1533      Component Value Date/Time   CALCIUM 9.1 01/22/2015 1040   CALCIUM 9.3 12/28/2014 1533   ALKPHOS 81 01/22/2015 1040   ALKPHOS 67 02/24/2014 1013   AST 21 01/22/2015 1040   AST 33 02/24/2014 1013   ALT 17 01/22/2015 1040   ALT 31 02/24/2014 1013   BILITOT 0.23 01/22/2015 1040   BILITOT 0.7 02/24/2014 1013  Lab Results  Component Value Date   WBC 16.5* 01/22/2015   HGB 11.9 01/22/2015   HCT 37.7 01/22/2015   MCV 94.0 01/22/2015   PLT 224 01/22/2015   NEUTROABS 11.8* 01/22/2015   ASSESSMENT & PLAN:  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 breast 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, focal vascular involvement, posterior margin involvement, 1/2 sentinel node positive, T1 cN1 M0 stage II a, minimal present luminal type B, high risk, disease free survival with chemotherapy and hormonal therapy 88% versus 76% with hormonal therapy alone  Treatment Plan: Adjuvant chemotherapy with Taxotere and Cytoxan every 3 weeks 4 cycles started 01/15/15. Overall the patient is tolerating her chemotherapy well.  Chemotherapy related toxicities: 1. Fatigue: This is mild and not interfering with ADLs.  Return in 2 weeks prior to cycle 2 of her chemotherapy.       No orders of the defined types were placed in this encounter.   The patient  has a good understanding of the overall plan. she agrees with it. She will call with any problems that may develop before her next visit here.   Mikey Bussing, NP

## 2015-01-22 NOTE — Patient Instructions (Signed)

## 2015-01-22 NOTE — Telephone Encounter (Signed)
Per staff message and POF I have scheduled appts. Advised scheduler of appts. JMW  

## 2015-01-22 NOTE — Telephone Encounter (Signed)
Gave avs & calendar for March. Sent message to schedule treatment. °

## 2015-01-22 NOTE — Assessment & Plan Note (Signed)
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, focal vascular involvement, posterior margin involvement, 1/2 sentinel node positive, T1 cN1 M0 stage II a, minimal present luminal type B, high risk, disease free survival with chemotherapy and hormonal therapy 88% versus 76% with hormonal therapy alone  Treatment Plan: Adjuvant chemotherapy with Taxotere and Cytoxan every 3 weeks 4 cycles started 01/15/15. Overall the patient is tolerating her chemotherapy well.  Chemotherapy related toxicities: 1. Fatigue: This is mild and not interfering with ADLs.  Return in 2 weeks prior to cycle 2 of her chemotherapy.

## 2015-01-23 ENCOUNTER — Ambulatory Visit: Payer: Medicare Other | Admitting: Physical Therapy

## 2015-01-23 DIAGNOSIS — G8929 Other chronic pain: Secondary | ICD-10-CM | POA: Diagnosis not present

## 2015-01-23 DIAGNOSIS — M549 Dorsalgia, unspecified: Secondary | ICD-10-CM

## 2015-01-23 DIAGNOSIS — M25611 Stiffness of right shoulder, not elsewhere classified: Secondary | ICD-10-CM | POA: Diagnosis not present

## 2015-01-23 NOTE — Patient Instructions (Signed)
Flexors Stick Stretch I   Stand or sit, dowel in palm of arm to be stretched. Other arm, holding dowel at side and behind body, pushes arm being stretched forward and upward until straight over head. Hold __5_ seconds. Repeat _5__ times per session. Do _2-3__ sessions per day.  Copyright  VHI. All rights reserved.  Inferior Capsule Stick Stretch I   Stand or sit, dowel in palm of arm to be stretched. Other arm, holding dowel in front of body, pushes outward and upward until arm being stretched is as high to the side as possible. Hold _5__ seconds. Repeat _5__ times per session. Do __2-3_ sessions per day.  Copyright  VHI. All rights reserved.

## 2015-01-23 NOTE — Therapy (Signed)
[place], [place], [address] Phone: [phone]   Fax:  [phone]  Physical Therapy Treatment  Patient Details  Name: Shawna Marquez MRN: [id number] Date of Birth: Nov 21, 1941 Referring Provider:  Marletta Lor, MD  Encounter Date: 01/23/2015      PT End of Session - 01/23/15 1755    Visit Number 3   Number of Visits 9   Date for PT Re-Evaluation 02/23/15   PT Start Time 1115   PT Stop Time 1519   PT Time Calculation (min) 50 min   Activity Tolerance Patient tolerated treatment well   Behavior During Therapy [facility] for tasks assessed/performed      Past Medical History  Diagnosis Date  . ALLERGIC RHINITIS 10/12/2007  . BREAST CANCER, HX OF 10/12/2007    right breast diagnosed - 11/2014  . DIVERTICULITIS, HX OF 10/12/2007  . HYPOTHYROIDISM, PRIMARY 10/12/2007  . 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 Wears glasses   . Family history of breast cancer   . Complication of anesthesia     bp will drop,hard to wake up  . Sleep apnea     USES cpap NIGHTLY  . OSTEOARTHRITIS 09/09/2007    back     Past Surgical History  Procedure Laterality Date  . Colovaginal fistula  2007    colon resection  . Abdominal hysterectomy  1977  . Hernia repair  2007    ventral  . Colon surgery  2006    part colectomy  . Mastectomy  1997    lt mast-axillary node dissection  . Tonsillectomy    . Colonoscopy  2007  . Wisdom tooth extraction    . Eye surgery      both cataracts  . Radioactive seed guided mastectomy with axillary sentinel lymph node biopsy Right 11/28/2014    Procedure: RADIOACTIVE SEED GUIDED RIGHT LUMPECTOMY WITH RIGHT AXILLARY SENTINEL LYMPH NODE BIOPSY;  Surgeon: Rolm Bookbinder, MD;  Location: [facility];  Service: General;  Laterality: Right;  . Re-excision of breast cancer,superior margins Right 01/01/2015    Procedure: RIGHT BREAST MARGIN EXCISION;   Surgeon: Rolm Bookbinder, MD;  Location: [place] [facility];  Service: General;  Laterality: Right;  . Portacath placement Left 01/01/2015    Procedure: INSERTION PORT-A-CATH;  Surgeon: Rolm Bookbinder, MD;  Location: [place];  Service: General;  Laterality: Left;    There were no vitals taken for this visit.  Visit Diagnosis:  Stiffness of joint, shoulder region, right  Back pain, chronic      Subjective Assessment - 01/23/15 1429    Symptoms Did fine after first session.  No change.     Currently in Pain? Yes   Pain Score 3    Pain Location Scapula   Pain Orientation Right;Left   Aggravating Factors  Neulasta; sitting the wrong way   Pain Relieving Factors Tylenol                    [facility] Adult PT Treatment/Exercise - 01/23/15 0001    Exercises   Exercises --   Shoulder Exercises: Supine   Other Supine Exercises supine with arms outstretched to the sides, flex hips and knees and do lower trunk rotation to left for right chest stretch   Shoulder Exercises: Seated   Other Seated Exercises protraction: unilateral and bilateral, arms forward with hands clasped and moving up and down; hands under feet, crossed to opposite foot, and arch back up  Shoulder Exercises: Standing   Other Standing Exercises dowel exercise for right shoulder AA flexion and abduction, 5 counts x 5 each   Manual Therapy   Manual Therapy Massage   Massage Stroking of right axillary cord with stretching.; trigger point massage, release at bilateral medial scapular border   Myofascial Release Cross hands technique at right axilla to stretch cording; right UE neural tension stretch.; myofascial pulling right UE in supine to sidelying, then right scapular mobilization.   Passive ROM Right shoulder with stretch into abduction and flexion (IR and ER WFL with PROM)                PT Education - 01/23/15 1503    Education provided Yes   Education Details dowel for right and left shoulder flexion and  abduction; supine with arms outstretched, lower trunk rotation to left for right chest stretch; rhomboid stretches in sitting   Person(s) Educated Patient   Methods Explanation;Demonstration;Handout;Tactile cues   Comprehension Verbalized understanding;Returned demonstration                [facility] Goals - 01/19/15 1216    CC Long Term Goal  #1   Title Pt. will be independent in home exercise program for right shoulder ROM and bilateral UE strengthening.   Time 4   Period Weeks   Status On-going   CC Long Term Goal  #2   Title Pt. will be independent in home exercise program for core strengthening and minimizing back pain.   Time 4   Period Weeks   CC Long Term Goal  #3   Title Pt. will report at least 50% perceived decrease in cording in right axilla.   Time 4   Period Weeks   Status On-going            Plan - 01/23/15 1756    Pt will benefit from skilled therapeutic intervention in order to improve on the following deficits Decreased range of motion;Decreased knowledge of precautions;Impaired UE functional use;Other (comment);Pain   Rehab Potential Excellent   PT Frequency 2x / week   PT Duration 4 weeks   PT Treatment/Interventions Therapeutic exercise;Patient/family education;Manual techniques;Passive range of motion   PT Home Exercise Plan Will need HEP for core strengthening, strength ABC program, possible further AAROM for shoulder.   Consulted and Agree with Plan of Care Patient        Problem List Patient Active Problem List   Diagnosis Date Noted  . Family history of breast cancer   . 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 breast 11/17/2014  . Hypothyroidism 10/12/2007  . ALLERGIC RHINITIS 10/12/2007  . BREAST CANCER, HX OF 10/12/2007  . DIVERTICULITIS, HX OF 10/12/2007  . OSTEOARTHRITIS 09/09/2007    SALISBURY,DONNA 01/23/2015, 5:58 PM  [place], [place], [address] Phone: [phone]   Fax:  [phone]  [name], [place]

## 2015-01-25 ENCOUNTER — Ambulatory Visit: Payer: Medicare Other

## 2015-01-25 DIAGNOSIS — G8929 Other chronic pain: Secondary | ICD-10-CM | POA: Diagnosis not present

## 2015-01-25 DIAGNOSIS — M25611 Stiffness of right shoulder, not elsewhere classified: Secondary | ICD-10-CM

## 2015-01-25 DIAGNOSIS — M549 Dorsalgia, unspecified: Secondary | ICD-10-CM

## 2015-01-25 NOTE — Therapy (Signed)
[place], [place], [address] Phone: [phone]   Fax:  [phone]  Physical Therapy Treatment  Patient Details  Name: Shawna Marquez MRN: [id number] Date of Birth: 01/26/1941 Referring Provider:  Marletta Lor, MD  Encounter Date: 01/25/2015      PT End of Session - 01/25/15 1349    Visit Number 4   Number of Visits 9   Date for PT Re-Evaluation 02/23/15   PT Start Time 1302   PT Stop Time 5573   PT Time Calculation (min) 45 min      Past Medical History  Diagnosis Date  . ALLERGIC RHINITIS 10/12/2007  . BREAST CANCER, HX OF 10/12/2007    right breast diagnosed - 11/2014  . DIVERTICULITIS, HX OF 10/12/2007  . HYPOTHYROIDISM, PRIMARY 10/12/2007  . 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 Wears glasses   . Family history of breast cancer   . Complication of anesthesia     bp will drop,hard to wake up  . Sleep apnea     USES cpap NIGHTLY  . OSTEOARTHRITIS 09/09/2007    back     Past Surgical History  Procedure Laterality Date  . Colovaginal fistula  2007    colon resection  . Abdominal hysterectomy  1977  . Hernia repair  2007    ventral  . Colon surgery  2006    part colectomy  . Mastectomy  1997    lt mast-axillary node dissection  . Tonsillectomy    . Colonoscopy  2007  . Wisdom tooth extraction    . Eye surgery      both cataracts  . Radioactive seed guided mastectomy with axillary sentinel lymph node biopsy Right 11/28/2014    Procedure: RADIOACTIVE SEED GUIDED RIGHT LUMPECTOMY WITH RIGHT AXILLARY SENTINEL LYMPH NODE BIOPSY;  Surgeon: Rolm Bookbinder, MD;  Location: [facility];  Service: General;  Laterality: Right;  . Re-excision of breast cancer,superior margins Right 01/01/2015    Procedure: RIGHT BREAST MARGIN EXCISION;  Surgeon: Rolm Bookbinder, MD;  Location: [place];  Service: General;  Laterality: Right;  . Portacath placement  Left 01/01/2015    Procedure: INSERTION PORT-A-CATH;  Surgeon: Rolm Bookbinder, MD;  Location: [place];  Service: General;  Laterality: Left;    There were no vitals taken for this visit.  Visit Diagnosis:  Stiffness of joint, shoulder region, right  Back pain, chronic      Subjective Assessment - 01/25/15 1308    Symptoms Felt good after last session, a little sore but that is to be expected.   Currently in Pain? No/denies                    [facility] Adult PT Treatment/Exercise - 01/25/15 0001    Lumbar Exercises: Supine   Other Supine Lumbar Exercises Pelvic tilt 10 reps with tactile cuing for technique, then tilt with: knees open/close and alternate marching 3 sets of 3 reps each.   Manual Therapy   Myofascial Release Cross hands technique at Rt chest wall and axill horizontally, and UE pulling throughout PROM, and from supine to Lt S/L and scapular mobs intio protraction in S/L   Passive ROM Rt shoulder into flexion, abduction, D2 and neural tension stretch                PT Education - 01/25/15 1347    Education provided Yes   Education Details Core stabs   Person(s)  Educated Patient   Methods Explanation;Demonstration   Comprehension Verbalized understanding;Returned demonstration;Tactile cues required;Need further instruction                [facility] Goals - 01/19/15 1216    CC Long Term Goal  #1   Title Pt. will be independent in home exercise program for right shoulder ROM and bilateral UE strengthening.   Time 4   Period Weeks   Status On-going   CC Long Term Goal  #2   Title Pt. will be independent in home exercise program for core strengthening and minimizing back pain.   Time 4   Period Weeks   CC Long Term Goal  #3   Title Pt. will report at least 50% perceived decrease in cording in right axilla.   Time 4   Period Weeks   Status On-going            Plan - 01/25/15 1350    Clinical Impression Statement Can tell  cording is improving and pts PROM improves throughout session.   Pt will benefit from skilled therapeutic intervention in order to improve on the following deficits Decreased range of motion;Decreased knowledge of precautions;Impaired UE functional use;Other (comment);Pain   Rehab Potential Excellent   PT Frequency 2x / week   PT Duration 4 weeks   PT Treatment/Interventions Therapeutic exercise;Patient/family education;Manual techniques;Passive range of motion   PT Next Visit Plan Continue manual techniques for shoulder ROM and for trigger point massage/release in upper back   PT Home Exercise Plan Core stabs   Consulted and Agree with Plan of Care Patient        Problem List Patient Active Problem List   Diagnosis Date Noted  . Family history of breast cancer   . 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 breast 11/17/2014  . Hypothyroidism 10/12/2007  . ALLERGIC RHINITIS 10/12/2007  . BREAST CANCER, HX OF 10/12/2007  . DIVERTICULITIS, HX OF 10/12/2007  . OSTEOARTHRITIS 09/09/2007    Otelia Limes, PTA 01/25/2015, 1:54 PM  [place], [place], [address] Phone: [phone]   Fax:  [phone]

## 2015-01-25 NOTE — Patient Instructions (Signed)
Pelvic Tilt   Flatten back by tightening stomach muscles and buttocks. Then hold tilt and open/close knees. Also hold tilt and alternate marching. Repeat __10__ times per set. Do _1-2___ sets per session. Do __1-2__ sessions per day.  http://orth.exer.us/135   Copyright  VHI. All rights reserved.

## 2015-01-29 ENCOUNTER — Ambulatory Visit: Payer: Medicare Other | Admitting: Physical Therapy

## 2015-01-29 DIAGNOSIS — G8929 Other chronic pain: Secondary | ICD-10-CM

## 2015-01-29 DIAGNOSIS — M549 Dorsalgia, unspecified: Secondary | ICD-10-CM | POA: Diagnosis not present

## 2015-01-29 DIAGNOSIS — M25611 Stiffness of right shoulder, not elsewhere classified: Secondary | ICD-10-CM | POA: Diagnosis not present

## 2015-01-29 NOTE — Therapy (Signed)
[place], [place], [address] Phone: [phone]   Fax:  [phone]  Physical Therapy Treatment  Patient Details  Name: Shawna Marquez MRN: [id number] Date of Birth: 1941/10/13 Referring Provider:  Marletta Lor, MD  Encounter Date: 01/29/2015      PT End of Session - 01/29/15 1510    Visit Number 5   Number of Visits 9   Date for PT Re-Evaluation 02/23/15   PT Start Time 6301   PT Stop Time 1348   PT Time Calculation (min) 43 min   Activity Tolerance Patient tolerated treatment well   Behavior During Therapy [facility] for tasks assessed/performed      Past Medical History  Diagnosis Date  . ALLERGIC RHINITIS 10/12/2007  . BREAST CANCER, HX OF 10/12/2007    right breast diagnosed - 11/2014  . DIVERTICULITIS, HX OF 10/12/2007  . HYPOTHYROIDISM, PRIMARY 10/12/2007  . 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 Wears glasses   . Family history of breast cancer   . Complication of anesthesia     bp will drop,hard to wake up  . Sleep apnea     USES cpap NIGHTLY  . OSTEOARTHRITIS 09/09/2007    back     Past Surgical History  Procedure Laterality Date  . Colovaginal fistula  2007    colon resection  . Abdominal hysterectomy  1977  . Hernia repair  2007    ventral  . Colon surgery  2006    part colectomy  . Mastectomy  1997    lt mast-axillary node dissection  . Tonsillectomy    . Colonoscopy  2007  . Wisdom tooth extraction    . Eye surgery      both cataracts  . Radioactive seed guided mastectomy with axillary sentinel lymph node biopsy Right 11/28/2014    Procedure: RADIOACTIVE SEED GUIDED RIGHT LUMPECTOMY WITH RIGHT AXILLARY SENTINEL LYMPH NODE BIOPSY;  Surgeon: Rolm Bookbinder, MD;  Location: [place];  Service: General;  Laterality: Right;  . Re-excision of breast cancer,superior margins Right 01/01/2015    Procedure: RIGHT BREAST MARGIN EXCISION;   Surgeon: Rolm Bookbinder, MD;  Location: [place];  Service: General;  Laterality: Right;  . Portacath placement Left 01/01/2015    Procedure: INSERTION PORT-A-CATH;  Surgeon: Rolm Bookbinder, MD;  Location: [place];  Service: General;  Laterality: Left;    There were no vitals taken for this visit.  Visit Diagnosis:  Stiffness of joint, shoulder region, right  Back pain, chronic      Subjective Assessment - 01/29/15 1306    Symptoms Nothing new; started doing abdominal exercises with her husband.   Currently in Pain? No/denies          [facility] PT Assessment - 01/29/15 0001    AROM   Right Shoulder Flexion 150 Degrees   Right Shoulder ABduction 168 Degrees   Right Shoulder Internal Rotation 64 Degrees   Right Shoulder External Rotation 84 Degrees                  [facility] Adult PT Treatment/Exercise - 01/29/15 0001    Lumbar Exercises: Supine   Ab Set 10 reps   Heel Slides 10 reps   Other Supine Lumbar Exercises pelvic tilt with knees apart/together x 10   Other Supine Lumbar Exercises pelvic tilt with marching right/left 3 x for 5 sets   Manual Therapy   Massage of right axillary cord during stretching  Myofascial Release crosshands technique at right upper arm with one hand and right chest with other; right UE myofascial pulling supine to sidelying, with scapular mobilization   Passive ROM In supine for right shoulder IR, ER, abduction, and flexion.  Right UE neural tension stretches.                 PT Education - 01/29/15 1509    Education provided Yes   Education Details supine hooklying abdominal set with extending one leg out and back, then the other, up to 20 reps if done correctly   Person(s) Educated Patient   Methods Explanation;Demonstration   Comprehension Returned demonstration                [place] - 01/29/15 1345    CC Long Term Goal  #3   Status Achieved   CC Long Term Goal  #4   Title Pt. will report 90%  perceived decrease in cording in right axilla.   Time 2   Period Weeks   Status New            Plan - 01/29/15 1511    Pt will benefit from skilled therapeutic intervention in order to improve on the following deficits Decreased range of motion;Pain;Decreased knowledge of precautions   Rehab Potential Excellent   PT Frequency 2x / week   PT Duration 2 weeks   PT Treatment/Interventions Therapeutic exercise;Patient/family education;Manual techniques;Passive range of motion   PT Next Visit Plan Continue manual techniques for shoulder ROM and for trigger point massage/release in upper back; continue core strengthening   PT Home Exercise Plan Core stabs   Consulted and Agree with Plan of Care Patient        Problem List Patient Active Problem List   Diagnosis Date Noted  . Family history of breast cancer   . 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 breast 11/17/2014  . Hypothyroidism 10/12/2007  . ALLERGIC RHINITIS 10/12/2007  . BREAST CANCER, HX OF 10/12/2007  . DIVERTICULITIS, HX OF 10/12/2007  . OSTEOARTHRITIS 09/09/2007    [name] 01/29/2015, 3:13 PM  [place], [place], [address] Phone: [phone]   Fax:  [phone]   [name], [place]

## 2015-02-01 ENCOUNTER — Ambulatory Visit: Payer: Medicare Other

## 2015-02-01 DIAGNOSIS — G8929 Other chronic pain: Secondary | ICD-10-CM

## 2015-02-01 DIAGNOSIS — M549 Dorsalgia, unspecified: Secondary | ICD-10-CM | POA: Diagnosis not present

## 2015-02-01 DIAGNOSIS — M25611 Stiffness of right shoulder, not elsewhere classified: Secondary | ICD-10-CM

## 2015-02-01 NOTE — Therapy (Signed)
[place], [place], [address] Phone: [phone]   Fax:  [phone]  Physical Therapy Treatment  Patient Details  Name: Shawna Marquez MRN: [id number] Date of Birth: 04/01/1941 Referring Provider:  Marletta Lor, MD  Encounter Date: 02/01/2015      PT End of Session - 02/01/15 1401    Visit Number 6   Number of Visits 9   Date for PT Re-Evaluation 02/23/15   PT Start Time 7026   PT Stop Time 3785   PT Time Calculation (min) 46 min      Past Medical History  Diagnosis Date  . ALLERGIC RHINITIS 10/12/2007  . BREAST CANCER, HX OF 10/12/2007    right breast diagnosed - 11/2014  . DIVERTICULITIS, HX OF 10/12/2007  . HYPOTHYROIDISM, PRIMARY 10/12/2007  . 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 Wears glasses   . Family history of breast cancer   . Complication of anesthesia     bp will drop,hard to wake up  . Sleep apnea     USES cpap NIGHTLY  . OSTEOARTHRITIS 09/09/2007    back     Past Surgical History  Procedure Laterality Date  . Colovaginal fistula  2007    colon resection  . Abdominal hysterectomy  1977  . Hernia repair  2007    ventral  . Colon surgery  2006    part colectomy  . Mastectomy  1997    lt mast-axillary node dissection  . Tonsillectomy    . Colonoscopy  2007  . Wisdom tooth extraction    . Eye surgery      both cataracts  . Radioactive seed guided mastectomy with axillary sentinel lymph node biopsy Right 11/28/2014    Procedure: RADIOACTIVE SEED GUIDED RIGHT LUMPECTOMY WITH RIGHT AXILLARY SENTINEL LYMPH NODE BIOPSY;  Surgeon: Rolm Bookbinder, MD;  Location: [place];  Service: General;  Laterality: Right;  . Re-excision of breast cancer,superior margins Right 01/01/2015    Procedure: RIGHT BREAST MARGIN EXCISION;  Surgeon: Rolm Bookbinder, MD;  Location: [place];  Service: General;  Laterality: Right;  . Portacath placement  Left 01/01/2015    Procedure: INSERTION PORT-A-CATH;  Surgeon: Rolm Bookbinder, MD;  Location: [place];  Service: General;  Laterality: Left;    There were no vitals taken for this visit.  Visit Diagnosis:  Stiffness of joint, shoulder region, right  Back pain, chronic      Subjective Assessment - 02/01/15 1306    Symptoms Doing good except my hair is already starting ot fall out in clumps! Did all my abdominal exercises this morning.          [facility] PT Assessment - 02/01/15 0001    AROM   Right Shoulder Flexion 156 Degrees   Right Shoulder ABduction 168 Degrees   Right Shoulder Internal Rotation 65 Degrees                  [facility] Adult PT Treatment/Exercise - 02/01/15 0001    Lumbar Exercises: Supine   Clam 10 reps;3 seconds;Limitations  In S/L with abdominal engagement   Clam Limitations Performed in set of 3-4 as holding abdominals engaged was challenging   Bridge 10 reps;5 seconds   Other Supine Lumbar Exercises Bridge with: knees open/close 2 sets of 5 reps; Pelvic Tilt with: alternate marching 10 reps and heel slides 10 reps each LE.   Other Supine Lumbar Exercises --   Shoulder Exercises: Supine  Flexion AROM;Both;5 reps  Supine on towel roll   Other Supine Exercises Supine on towel roll with UE's in abduction 1 minute, and then bil UE's into scaption 8 reps   Shoulder Exercises: Standing   Internal Rotation 5 reps;AAROM;Right  With towel for stretch to end ROM   Manual Therapy   Massage Of Rt axillary cord during PROM   Myofascial Release Crosshands technique at Rt chest wall and Rt axilla/upper arm at cording.    Passive ROM In Supine: To Rt shoulder into flexion, abduction, D2, and neural tension stretch.                PT Education - 02/01/15 1356    Education provided Yes   Education Details Towel IR stretch and supine on towel roll for UE and pect stretch   Person(s) Educated Patient   Methods Explanation;Demonstration;Verbal cues    Comprehension Verbalized understanding;Returned demonstration                [facility] Goals - 02/01/15 1404    CC Long Term Goal  #1   Title Pt. will be independent in home exercise program for right shoulder ROM and bilateral UE strengthening.   Status On-going   CC Long Term Goal  #2   Title Pt. will be independent in home exercise program for core strengthening and minimizing back pain.   Status On-going   CC Long Term Goal  #4   Title Pt. will report 90% perceived decrease in cording in right axilla.   Status On-going            Plan - 02/01/15 1401    Clinical Impression Statement Pt is enjoying the HEP and has even gotten her husband to do the exs with her. Also feels therapy is really helping her progress towards her goals.   Pt will benefit from skilled therapeutic intervention in order to improve on the following deficits Decreased range of motion;Pain;Decreased knowledge of precautions   Rehab Potential Excellent   PT Frequency 2x / week   PT Duration 2 weeks   PT Treatment/Interventions Therapeutic exercise;Patient/family education;Manual techniques;Passive range of motion   PT Next Visit Plan Continue manual techniques for shoulder ROM and to address cording at Rt axilla, continue core strength.   PT Home Exercise Plan Core stabs   Consulted and Agree with Plan of Care Patient        Problem List Patient Active Problem List   Diagnosis Date Noted  . Family history of breast cancer   . 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 breast 11/17/2014  . Hypothyroidism 10/12/2007  . ALLERGIC RHINITIS 10/12/2007  . BREAST CANCER, HX OF 10/12/2007  . DIVERTICULITIS, HX OF 10/12/2007  . OSTEOARTHRITIS 09/09/2007    Otelia Limes, PTA 02/01/2015, 2:13 PM  [place], [place], [address] Phone: [phone]   Fax:  [phone]

## 2015-02-05 ENCOUNTER — Other Ambulatory Visit: Payer: Medicare Other

## 2015-02-05 ENCOUNTER — Ambulatory Visit: Payer: Medicare Other

## 2015-02-05 ENCOUNTER — Ambulatory Visit (HOSPITAL_BASED_OUTPATIENT_CLINIC_OR_DEPARTMENT_OTHER): Payer: Medicare Other

## 2015-02-05 ENCOUNTER — Other Ambulatory Visit (HOSPITAL_BASED_OUTPATIENT_CLINIC_OR_DEPARTMENT_OTHER): Payer: Medicare Other

## 2015-02-05 ENCOUNTER — Telehealth: Payer: Self-pay | Admitting: Hematology and Oncology

## 2015-02-05 ENCOUNTER — Ambulatory Visit (HOSPITAL_BASED_OUTPATIENT_CLINIC_OR_DEPARTMENT_OTHER): Payer: Medicare Other | Admitting: Hematology and Oncology

## 2015-02-05 VITALS — BP 124/65 | HR 89 | Temp 98.5°F | Resp 18 | Ht 61.0 in | Wt 173.6 lb

## 2015-02-05 DIAGNOSIS — C773 Secondary and unspecified malignant neoplasm of axilla and upper limb lymph nodes: Secondary | ICD-10-CM

## 2015-02-05 DIAGNOSIS — Z5111 Encounter for antineoplastic chemotherapy: Secondary | ICD-10-CM

## 2015-02-05 DIAGNOSIS — C50211 Malignant neoplasm of upper-inner quadrant of right female breast: Secondary | ICD-10-CM

## 2015-02-05 DIAGNOSIS — Z95828 Presence of other vascular implants and grafts: Secondary | ICD-10-CM

## 2015-02-05 DIAGNOSIS — R530 Neoplastic (malignant) related fatigue: Secondary | ICD-10-CM

## 2015-02-05 LAB — CBC WITH DIFFERENTIAL/PLATELET
BASO%: 0.2 % (ref 0.0–2.0)
Basophils Absolute: 0 10*3/uL (ref 0.0–0.1)
EOS%: 0 % (ref 0.0–7.0)
Eosinophils Absolute: 0 10*3/uL (ref 0.0–0.5)
HEMATOCRIT: 37.1 % (ref 34.8–46.6)
HEMOGLOBIN: 12.1 g/dL (ref 11.6–15.9)
LYMPH%: 8.5 % — AB (ref 14.0–49.7)
MCH: 31.2 pg (ref 25.1–34.0)
MCHC: 32.6 g/dL (ref 31.5–36.0)
MCV: 95.6 fL (ref 79.5–101.0)
MONO#: 0.8 10*3/uL (ref 0.1–0.9)
MONO%: 6.9 % (ref 0.0–14.0)
NEUT%: 84.4 % — ABNORMAL HIGH (ref 38.4–76.8)
NEUTROS ABS: 10.2 10*3/uL — AB (ref 1.5–6.5)
PLATELETS: 356 10*3/uL (ref 145–400)
RBC: 3.88 10*6/uL (ref 3.70–5.45)
RDW: 14.1 % (ref 11.2–14.5)
WBC: 12.1 10*3/uL — ABNORMAL HIGH (ref 3.9–10.3)
lymph#: 1 10*3/uL (ref 0.9–3.3)

## 2015-02-05 LAB — COMPREHENSIVE METABOLIC PANEL (CC13)
ALK PHOS: 71 U/L (ref 40–150)
ALT: 23 U/L (ref 0–55)
AST: 21 U/L (ref 5–34)
Albumin: 3.5 g/dL (ref 3.5–5.0)
Anion Gap: 10 mEq/L (ref 3–11)
BILIRUBIN TOTAL: 0.36 mg/dL (ref 0.20–1.20)
BUN: 15.5 mg/dL (ref 7.0–26.0)
CO2: 24 mEq/L (ref 22–29)
Calcium: 9.2 mg/dL (ref 8.4–10.4)
Chloride: 108 mEq/L (ref 98–109)
Creatinine: 0.7 mg/dL (ref 0.6–1.1)
EGFR: 85 mL/min/{1.73_m2} — ABNORMAL LOW (ref >90)
GLUCOSE: 109 mg/dL (ref 70–140)
POTASSIUM: 4.1 meq/L (ref 3.5–5.1)
SODIUM: 142 meq/L (ref 136–145)
TOTAL PROTEIN: 6.6 g/dL (ref 6.4–8.3)

## 2015-02-05 MED ORDER — DOCETAXEL CHEMO INJECTION 160 MG/16ML
75.0000 mg/m2 | Freq: Once | INTRAVENOUS | Status: AC
  Administered 2015-02-05: 140 mg via INTRAVENOUS
  Filled 2015-02-05: qty 14

## 2015-02-05 MED ORDER — SODIUM CHLORIDE 0.9 % IV SOLN
600.0000 mg/m2 | Freq: Once | INTRAVENOUS | Status: AC
  Administered 2015-02-05: 1120 mg via INTRAVENOUS
  Filled 2015-02-05: qty 56

## 2015-02-05 MED ORDER — SODIUM CHLORIDE 0.9 % IJ SOLN
10.0000 mL | INTRAMUSCULAR | Status: DC | PRN
  Administered 2015-02-05: 10 mL
  Filled 2015-02-05: qty 10

## 2015-02-05 MED ORDER — HEPARIN SOD (PORK) LOCK FLUSH 100 UNIT/ML IV SOLN
500.0000 [IU] | Freq: Once | INTRAVENOUS | Status: AC | PRN
  Administered 2015-02-05: 500 [IU]
  Filled 2015-02-05: qty 5

## 2015-02-05 MED ORDER — ONDANSETRON 16 MG/50ML IVPB (CHCC)
INTRAVENOUS | Status: AC
  Filled 2015-02-05: qty 16

## 2015-02-05 MED ORDER — DEXAMETHASONE SODIUM PHOSPHATE 10 MG/ML IJ SOLN
INTRAMUSCULAR | Status: AC
  Filled 2015-02-05: qty 1

## 2015-02-05 MED ORDER — ONDANSETRON 16 MG/50ML IVPB (CHCC)
16.0000 mg | Freq: Once | INTRAVENOUS | Status: AC
  Administered 2015-02-05: 16 mg via INTRAVENOUS

## 2015-02-05 MED ORDER — SODIUM CHLORIDE 0.9 % IV SOLN
Freq: Once | INTRAVENOUS | Status: AC
  Administered 2015-02-05: 11:00:00 via INTRAVENOUS

## 2015-02-05 MED ORDER — DEXAMETHASONE SODIUM PHOSPHATE 10 MG/ML IJ SOLN
10.0000 mg | Freq: Once | INTRAMUSCULAR | Status: AC
Start: 2015-02-05 — End: 2015-02-05
  Administered 2015-02-05: 10 mg via INTRAVENOUS

## 2015-02-05 MED ORDER — SODIUM CHLORIDE 0.9 % IJ SOLN
10.0000 mL | INTRAMUSCULAR | Status: DC | PRN
  Administered 2015-02-05: 10 mL via INTRAVENOUS
  Filled 2015-02-05: qty 10

## 2015-02-05 NOTE — Telephone Encounter (Signed)
per pof ot sch pt appt-gave pt copy of sch-sent MW email to sch trmt-sent HF email to adv pt stated last herceptin s/b 4/19-adv pt willcall with reply

## 2015-02-05 NOTE — Patient Instructions (Signed)
[place] Discharge Instructions for Patients Receiving Chemotherapy  Today you received the following chemotherapy agents:  Taxotere and Cytoxan  To help prevent nausea and vomiting after your treatment, we encourage you to take your nausea medication as ordered per MD.   If you develop nausea and vomiting that is not controlled by your nausea medication, call the clinic.   BELOW ARE SYMPTOMS THAT SHOULD BE REPORTED IMMEDIATELY:  *FEVER GREATER THAN 100.5 F  *CHILLS WITH OR WITHOUT FEVER  NAUSEA AND VOMITING THAT IS NOT CONTROLLED WITH YOUR NAUSEA MEDICATION  *UNUSUAL SHORTNESS OF BREATH  *UNUSUAL BRUISING OR BLEEDING  TENDERNESS IN MOUTH AND THROAT WITH OR WITHOUT PRESENCE OF ULCERS  *URINARY PROBLEMS  *BOWEL PROBLEMS  UNUSUAL RASH Items with * indicate a potential emergency and should be followed up as soon as possible.  Feel free to call the clinic you have any questions or concerns. The clinic phone number is [phone].

## 2015-02-05 NOTE — Patient Instructions (Signed)

## 2015-02-05 NOTE — Assessment & Plan Note (Signed)
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, focal vascular involvement, posterior margin involvement, 1/2 sentinel node positive, T1 cN1 M0 stage II a, minimal present luminal type B, high risk, disease free survival with chemotherapy and hormonal therapy 88% versus 76% with hormonal therapy alone  Treatment Plan: Adjuvant chemotherapy with Taxotere and Cytoxan every 3 weeks 4 cycles started 01/15/15 Today is cycle 2 day 1  Toxicities to chemotherapy:  Return to clinic in 3 weeks for cycle 3

## 2015-02-05 NOTE — Progress Notes (Signed)
Patient Care Team: Marletta Lor, MD as PCP - General Rolm Bookbinder, MD as Consulting Physician (General Surgery) Rulon Eisenmenger, MD as Consulting Physician (Hematology and Oncology) Thea Silversmith, MD as Consulting Physician (Radiation Oncology) Trinda Pascal, NP as Nurse Practitioner (Nurse Practitioner)  DIAGNOSIS: 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 breast   Staging form: Breast, AJCC 7th Edition     Clinical stage from 11/22/2014: Stage IA (T1b, N0, M0) - Unsigned     Pathologic: Stage IIA (T1c, N1a, cM0) - Signed by Seward Grater, MD on 12/26/2014       Staging comments: Staged on final lumpectomy specimen read by Dr. Saralyn Pilar.    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    Mammogram 9 mm mass at 1:00 position   11/15/2014 Initial Biopsy Right breast needle biopsy: Grade 2 invasive ductal carcinoma with DCIS with lymphovascular invasion ER/PR positive HER-2 negative Ki-67 20%   11/28/2014 Surgery right breast lumpectomy: 1.7 cm invasive ductal carcinoma with DCIS, focal vascular involvement, posterior margin involvement, 1/2 sentinel node positive, Mammaprint Luminal type, High Risk   01/15/2015 -  Chemotherapy TC X 4 adjuvant chemo    CHIEF COMPLIANT: cycle 2 chemotherapy with Taxotere Cytoxan  INTERVAL HISTORY: Shawna Marquez is a 74 yr old who is receiving adjuvant chemo with TC. Today is cycle 2. After cycle 1, she had profound pain in bones for 2-3 days for which she took tylenol with good relief. Denied nausea or vomiting or bowel issues. No fevers or chills  REVIEW OF SYSTEMS:   Constitutional: Denies fevers, chills or abnormal weight loss Eyes: Denies blurriness of vision Ears, nose, mouth, throat, and face: Denies mucositis or sore throat Respiratory: Denies cough,  dyspnea or wheezes Cardiovascular: Denies palpitation, chest discomfort or lower extremity swelling Gastrointestinal:  Denies nausea, heartburn or change in bowel habits Skin: Denies abnormal skin rashes Lymphatics: Denies new lymphadenopathy or easy bruising Neurological:Denies numbness, tingling or new weaknesses Behavioral/Psych: Mood is stable, no new changes  Breast:  denies any pain or lumps or nodules in either breasts All other systems were reviewed with the patient and are negative.  I have reviewed the past medical history, past surgical history, social history and family history with the patient and they are unchanged from previous note.  ALLERGIES:  has No Known Allergies.  MEDICATIONS:  Current Outpatient Prescriptions  Medication Sig Dispense Refill  . dexamethasone (DECADRON) 4 MG tablet Take 1 tablet (4 mg total) by mouth 2 (two) times daily. Start the day before Taxotere. Then again the day after chemo for 3 days. 30 tablet 1  . hydrocortisone cream 1 % Apply 1 application topically daily as needed for itching (eczema).    Marland Kitchen levothyroxine (SYNTHROID, LEVOTHROID) 125 MCG tablet TAKE 1 TABLET BY MOUTH DAILY (Patient taking differently: Take 125 mcg by mouth daily before breakfast. TAKE 1 TABLET BY MOUTH DAILY) 90 tablet 3  . LORazepam (ATIVAN) 0.5 MG tablet Take 1 tablet (0.5 mg total) by mouth every 6 (six) hours as needed (Nausea or vomiting). 30 tablet 0  . ondansetron (ZOFRAN) 8 MG tablet Take 1 tablet (8 mg total) by mouth 2 (two) times daily. Start the day after chemo for 3 days. Then take as needed for nausea or vomiting. 30 tablet 1  . aspirin EC 81 MG  tablet Take 81 mg by mouth at bedtime.    . Calcium Citrate-Vitamin D (CALCIUM CITRATE + D3 PO) Take 1 tablet by mouth 2 (two) times daily. Calcium 500 mg, Vitamin D 800 units    . cholecalciferol (VITAMIN D) 1000 UNITS tablet Take 1,000 Units by mouth daily with lunch.     . Coenzyme Q10 (COQ10) 200 MG CAPS Take 200 mg  by mouth every other day.    . fluticasone (FLONASE) 50 MCG/ACT nasal spray USE 1 SPRAY IN EACH NOSTRIL TWICE DAILY (Patient not taking: Reported on 7/42/5956) 48 g 5  . folic acid (FOLVITE) 387 MCG tablet Take 400 mcg by mouth daily with lunch.     . lidocaine-prilocaine (EMLA) cream Apply to affected area once (Patient not taking: Reported on 02/05/2015) 30 g 3  . Multiple Vitamin (MULTIVITAMIN WITH MINERALS) TABS tablet Take 1 tablet by mouth daily with lunch.    . Omega-3 Fatty Acids (FISH OIL) 1000 MG CAPS Take 1,000 mg by mouth daily with lunch.    . oxyCODONE-acetaminophen (PERCOCET) 10-325 MG per tablet Take 1 tablet by mouth every 6 (six) hours as needed for pain. (Patient not taking: Reported on 02/05/2015) 20 tablet 0  . oxyCODONE-acetaminophen (PERCOCET) 10-325 MG per tablet Take 1 tablet by mouth every 6 (six) hours as needed for pain. (Patient not taking: Reported on 02/05/2015) 20 tablet 0  . prochlorperazine (COMPAZINE) 10 MG tablet Take 1 tablet (10 mg total) by mouth every 6 (six) hours as needed (Nausea or vomiting). (Patient not taking: Reported on 02/05/2015) 30 tablet 1  . Turmeric 450 MG CAPS Take 450 mg by mouth daily with lunch.     . vitamin C (ASCORBIC ACID) 500 MG tablet Take 500 mg by mouth 2 (two) times daily.     No current facility-administered medications for this visit.   Facility-Administered Medications Ordered in Other Visits  Medication Dose Route Frequency Provider Last Rate Last Dose  . cyclophosphamide (CYTOXAN) 1,120 mg in sodium chloride 0.9 % 250 mL chemo infusion  600 mg/m2 (Treatment Plan Actual) Intravenous Once Rulon Eisenmenger, MD      . DOCEtaxel (TAXOTERE) 140 mg in dextrose 5 % 250 mL chemo infusion  75 mg/m2 (Treatment Plan Actual) Intravenous Once Rulon Eisenmenger, MD 264 mL/hr at 02/05/15 1159 140 mg at 02/05/15 1159  . heparin lock flush 100 unit/mL  500 Units Intracatheter Once PRN Rulon Eisenmenger, MD      . ondansetron (ZOFRAN) IVPB 16 mg  16 mg  Intravenous Once Rulon Eisenmenger, MD      . sodium chloride 0.9 % injection 10 mL  10 mL Intracatheter PRN Rulon Eisenmenger, MD        PHYSICAL EXAMINATION: ECOG PERFORMANCE STATUS: 1 - Symptomatic but completely ambulatory  Filed Vitals:   02/05/15 1027  BP: 124/65  Pulse: 89  Temp: 98.5 F (36.9 C)  Resp: 18   Filed Weights   02/05/15 1027  Weight: 173 lb 9.6 oz (78.744 kg)    GENERAL:alert, no distress and comfortable SKIN: skin color, texture, turgor are normal, no rashes or significant lesions EYES: normal, Conjunctiva are pink and non-injected, sclera clear OROPHARYNX:no exudate, no erythema and lips, buccal mucosa, and tongue normal  NECK: supple, thyroid normal size, non-tender, without nodularity LYMPH:  no palpable lymphadenopathy in the cervical, axillary or inguinal LUNGS: clear to auscultation and percussion with normal breathing effort HEART: regular rate & rhythm and no murmurs and no lower extremity  edema ABDOMEN:abdomen soft, non-tender and normal bowel sounds Musculoskeletal:no cyanosis of digits and no clubbing  NEURO: alert & oriented x 3 with fluent speech, no focal motor/sensory deficits  LABORATORY DATA:  I have reviewed the data as listed   Chemistry      Component Value Date/Time   NA 142 02/05/2015 0947   NA 142 12/28/2014 1533   K 4.1 02/05/2015 0947   K 4.1 12/28/2014 1533   CL 104 12/28/2014 1533   CO2 24 02/05/2015 0947   CO2 30 12/28/2014 1533   BUN 15.5 02/05/2015 0947   BUN 11 12/28/2014 1533   CREATININE 0.7 02/05/2015 0947   CREATININE 0.76 12/28/2014 1533      Component Value Date/Time   CALCIUM 9.2 02/05/2015 0947   CALCIUM 9.3 12/28/2014 1533   ALKPHOS 71 02/05/2015 0947   ALKPHOS 67 02/24/2014 1013   AST 21 02/05/2015 0947   AST 33 02/24/2014 1013   ALT 23 02/05/2015 0947   ALT 31 02/24/2014 1013   BILITOT 0.36 02/05/2015 0947   BILITOT 0.7 02/24/2014 1013       Lab Results  Component Value Date   WBC 12.1*  02/05/2015   HGB 12.1 02/05/2015   HCT 37.1 02/05/2015   MCV 95.6 02/05/2015   PLT 356 02/05/2015   NEUTROABS 10.2* 02/05/2015     ASSESSMENT & PLAN:  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 breast 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, focal vascular involvement, posterior margin involvement, 1/2 sentinel node positive, T1 cN1 M0 stage II a, minimal present luminal type B, high risk, disease free survival with chemotherapy and hormonal therapy 88% versus 76% with hormonal therapy alone  Treatment Plan: Adjuvant chemotherapy with Taxotere and Cytoxan every 3 weeks 4 cycles started 01/15/15 Today is cycle 2 day 1  Toxicities to chemotherapy: 1. Bone and muscle pain related to Neulasta: I encouraged her to take Claritin-D along with Tylenol 2. Loss of taste 3. Fatigue related to chemotherapy  I reviewed her blood counts and there are adequate for treatment. Monitoring her closely for chemotherapy toxicities.  Return to clinic in 3 weeks for cycle 3    Orders Placed This Encounter  Procedures  . CBC with Differential    Standing Status: Future     Number of Occurrences:      Standing Expiration Date: 02/05/2016  . Comprehensive metabolic panel (Cmet) - [facility]    Standing Status: Future     Number of Occurrences:      Standing Expiration Date: 02/05/2016   The patient has a good understanding of the overall plan. she agrees with it. She will call with any problems that may develop before her next visit here.   Rulon Eisenmenger, MD

## 2015-02-06 ENCOUNTER — Encounter: Payer: Self-pay | Admitting: Internal Medicine

## 2015-02-06 ENCOUNTER — Ambulatory Visit (HOSPITAL_BASED_OUTPATIENT_CLINIC_OR_DEPARTMENT_OTHER): Payer: Medicare Other

## 2015-02-06 ENCOUNTER — Ambulatory Visit (INDEPENDENT_AMBULATORY_CARE_PROVIDER_SITE_OTHER): Payer: Medicare Other | Admitting: Internal Medicine

## 2015-02-06 VITALS — BP 130/68 | HR 81 | Temp 97.9°F | Resp 20 | Ht 61.0 in | Wt 180.0 lb

## 2015-02-06 DIAGNOSIS — E039 Hypothyroidism, unspecified: Secondary | ICD-10-CM | POA: Diagnosis not present

## 2015-02-06 DIAGNOSIS — Z853 Personal history of malignant neoplasm of breast: Secondary | ICD-10-CM

## 2015-02-06 DIAGNOSIS — C773 Secondary and unspecified malignant neoplasm of axilla and upper limb lymph nodes: Secondary | ICD-10-CM | POA: Diagnosis not present

## 2015-02-06 DIAGNOSIS — J3089 Other allergic rhinitis: Secondary | ICD-10-CM | POA: Diagnosis not present

## 2015-02-06 DIAGNOSIS — C50211 Malignant neoplasm of upper-inner quadrant of right female breast: Secondary | ICD-10-CM

## 2015-02-06 MED ORDER — PEGFILGRASTIM INJECTION 6 MG/0.6ML ~~LOC~~
6.0000 mg | PREFILLED_SYRINGE | Freq: Once | SUBCUTANEOUS | Status: AC
  Administered 2015-02-06: 6 mg via SUBCUTANEOUS
  Filled 2015-02-06: qty 0.6

## 2015-02-06 MED ORDER — FLUTICASONE PROPIONATE 50 MCG/ACT NA SUSP: 1.0000 | Freq: Two times a day (BID) | NASAL | Status: DC

## 2015-02-06 MED ORDER — LEVOTHYROXINE SODIUM 125 MCG PO TABS: 125.0000 ug | ORAL_TABLET | Freq: Every day | ORAL | Status: DC

## 2015-02-06 NOTE — Progress Notes (Signed)
Pre visit review using our clinic review tool, if applicable. No additional management support is needed unless otherwise documented below in the visit note. 

## 2015-02-06 NOTE — Progress Notes (Signed)
Subjective:    Patient ID: Shawna Marquez, female    DOB: Jul 15, 1941, 74 y.o.   MRN: [id number]  HPI  74 year old patient who is seen today for follow-up.  She has a history of osteoarthritis and hypothyroidism. She has been followed closely by oncology for breast cancer and presently is undergoing chemotherapy.  This is to be followed by radiotherapy.  She's had a recent extensive laboratory panel including thyroid function studies She generally feels quite well today.  Past Medical History  Diagnosis Date  . ALLERGIC RHINITIS 10/12/2007  . BREAST CANCER, HX OF 10/12/2007    right breast diagnosed - 11/2014  . DIVERTICULITIS, HX OF 10/12/2007  . HYPOTHYROIDISM, PRIMARY 10/12/2007  . 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 Wears glasses   . Family history of breast cancer   . Complication of anesthesia     bp will drop,hard to wake up  . Sleep apnea     USES cpap NIGHTLY  . OSTEOARTHRITIS 09/09/2007    back     History   Social History  . Marital Status: Married    Spouse Name: N/A  . Number of Children: 3  . Years of Education: N/A   Occupational History  . Not on file.   Social History Main Topics  . Smoking status: Never Smoker   . Smokeless tobacco: Never Used  . Alcohol Use: 0.0 oz/week    0 Standard drinks or equivalent per week     Comment: occassional beer  . Drug Use: No  . Sexual Activity: Not on file   Other Topics Concern  . Not on file   Social History Narrative    Past Surgical History  Procedure Laterality Date  . Colovaginal fistula  2007    colon resection  . Abdominal hysterectomy  1977  . Hernia repair  2007    ventral  . Colon surgery  2006    part colectomy  . Mastectomy  1997    lt mast-axillary node dissection  . Tonsillectomy    . Colonoscopy  2007  . Wisdom tooth extraction    . Eye surgery      both cataracts  . Radioactive seed guided mastectomy with axillary sentinel lymph node biopsy Right 11/28/2014      Procedure: RADIOACTIVE SEED GUIDED RIGHT LUMPECTOMY WITH RIGHT AXILLARY SENTINEL LYMPH NODE BIOPSY;  Surgeon: Rolm Bookbinder, MD;  Location: [place] [facility];  Service: General;  Laterality: Right;  . Re-excision of breast cancer,superior margins Right 01/01/2015    Procedure: RIGHT BREAST MARGIN EXCISION;  Surgeon: Rolm Bookbinder, MD;  Location: [facility];  Service: General;  Laterality: Right;  . Portacath placement Left 01/01/2015    Procedure: INSERTION PORT-A-CATH;  Surgeon: Rolm Bookbinder, MD;  Location: [facility];  Service: General;  Laterality: Left;    Family History  Problem Relation Age of Onset  . Heart disease Neg Hx     family hx  . Cancer Neg Hx     lung ca  . COPD Neg Hx     family hx  . Breast cancer Paternal Grandmother 57  . Lung cancer Father 23    smoker  . Prostate cancer Cousin     paternal cousin     No Known Allergies  Current Outpatient Prescriptions on File Prior to Visit  Medication Sig Dispense Refill  . aspirin EC 81 MG tablet Take 81 mg by mouth at bedtime.    . Calcium Citrate-Vitamin D (CALCIUM  CITRATE + D3 PO) Take 1 tablet by mouth 2 (two) times daily. Calcium 500 mg, Vitamin D 800 units    . cholecalciferol (VITAMIN D) 1000 UNITS tablet Take 1,000 Units by mouth daily with lunch.     . Coenzyme Q10 (COQ10) 200 MG CAPS Take 200 mg by mouth every other day.    Marland Kitchen dexamethasone (DECADRON) 4 MG tablet Take 1 tablet (4 mg total) by mouth 2 (two) times daily. Start the day before Taxotere. Then again the day after chemo for 3 days. 30 tablet 1  . fluticasone (FLONASE) 50 MCG/ACT nasal spray USE 1 SPRAY IN EACH NOSTRIL TWICE DAILY 48 g 5  . folic acid (FOLVITE) 211 MCG tablet Take 400 mcg by mouth daily with lunch.     . hydrocortisone cream 1 % Apply 1 application topically daily as needed for itching (eczema).    Marland Kitchen levothyroxine (SYNTHROID, LEVOTHROID) 125 MCG tablet TAKE 1 TABLET BY MOUTH DAILY (Patient taking differently: Take 125 mcg by  mouth daily before breakfast. TAKE 1 TABLET BY MOUTH DAILY) 90 tablet 3  . lidocaine-prilocaine (EMLA) cream Apply to affected area once 30 g 3  . LORazepam (ATIVAN) 0.5 MG tablet Take 1 tablet (0.5 mg total) by mouth every 6 (six) hours as needed (Nausea or vomiting). 30 tablet 0  . Multiple Vitamin (MULTIVITAMIN WITH MINERALS) TABS tablet Take 1 tablet by mouth daily with lunch.    . Omega-3 Fatty Acids (FISH OIL) 1000 MG CAPS Take 1,000 mg by mouth daily with lunch.    . ondansetron (ZOFRAN) 8 MG tablet Take 1 tablet (8 mg total) by mouth 2 (two) times daily. Start the day after chemo for 3 days. Then take as needed for nausea or vomiting. 30 tablet 1  . oxyCODONE-acetaminophen (PERCOCET) 10-325 MG per tablet Take 1 tablet by mouth every 6 (six) hours as needed for pain. 20 tablet 0  . oxyCODONE-acetaminophen (PERCOCET) 10-325 MG per tablet Take 1 tablet by mouth every 6 (six) hours as needed for pain. 20 tablet 0  . prochlorperazine (COMPAZINE) 10 MG tablet Take 1 tablet (10 mg total) by mouth every 6 (six) hours as needed (Nausea or vomiting). 30 tablet 1  . Turmeric 450 MG CAPS Take 450 mg by mouth daily with lunch.     . vitamin C (ASCORBIC ACID) 500 MG tablet Take 500 mg by mouth 2 (two) times daily.     No current facility-administered medications on file prior to visit.    BP 130/68 mmHg  Pulse 81  Temp(Src) 97.9 F (36.6 C) (Oral)  Resp 20  Ht 5\' 1"  (1.549 m)  Wt 180 lb (81.647 kg)  BMI 34.03 kg/m2  SpO2 98%       Review of Systems  Constitutional: Negative.   HENT: Negative for congestion, dental problem, hearing loss, rhinorrhea, sinus pressure, sore throat and tinnitus.   Eyes: Negative for pain, discharge and visual disturbance.  Respiratory: Negative for cough and shortness of breath.   Cardiovascular: Negative for chest pain, palpitations and leg swelling.  Gastrointestinal: Negative for nausea, vomiting, abdominal pain, diarrhea, constipation, blood in stool and  abdominal distention.  Genitourinary: Negative for dysuria, urgency, frequency, hematuria, flank pain, vaginal bleeding, vaginal discharge, difficulty urinating, vaginal pain and pelvic pain.  Musculoskeletal: Negative for joint swelling, arthralgias and gait problem.  Skin: Negative for rash.  Neurological: Negative for dizziness, syncope, speech difficulty, weakness, numbness and headaches.  Hematological: Negative for adenopathy.  Psychiatric/Behavioral: Negative for behavioral problems, dysphoric  mood and agitation. The patient is not nervous/anxious.        Objective:   Physical Exam  Constitutional: She is oriented to person, place, and time. She appears well-developed and well-nourished.  HENT:  Head: Normocephalic.  Right Ear: External ear normal.  Left Ear: External ear normal.  Mouth/Throat: Oropharynx is clear and moist.  Eyes: Conjunctivae and EOM are normal. Pupils are equal, round, and reactive to light.  Neck: Normal range of motion. Neck supple. No thyromegaly present.  Cardiovascular: Normal rate, regular rhythm, normal heart sounds and intact distal pulses.   Pulmonary/Chest: Effort normal and breath sounds normal.  Abdominal: Soft. Bowel sounds are normal. She exhibits no mass. There is no tenderness.  Musculoskeletal: Normal range of motion.  Lymphadenopathy:    She has no cervical adenopathy.  Neurological: She is alert and oriented to person, place, and time.  Skin: Skin is warm and dry. No rash noted.  Psychiatric: She has a normal mood and affect. Her behavior is normal.          Assessment & Plan:   Hypothyroidism.  Recent thyroid function studies reviewed.  Will continue present dose Osteoarthritis Breast cancer.  Follow-up oncology  Recheck here one year or as needed

## 2015-02-06 NOTE — Patient Instructions (Signed)
It is important that you exercise regularly, at least 20 minutes 3 to 4 times per week.  If you develop chest pain or shortness of breath seek  medical attention.  Return in one year for follow-up   

## 2015-02-08 ENCOUNTER — Ambulatory Visit: Payer: Medicare Other | Attending: General Surgery

## 2015-02-08 DIAGNOSIS — M25611 Stiffness of right shoulder, not elsewhere classified: Secondary | ICD-10-CM | POA: Insufficient documentation

## 2015-02-08 DIAGNOSIS — M549 Dorsalgia, unspecified: Secondary | ICD-10-CM | POA: Insufficient documentation

## 2015-02-08 DIAGNOSIS — G8929 Other chronic pain: Secondary | ICD-10-CM

## 2015-02-08 NOTE — Therapy (Signed)
[place], [place], [address] Phone: [phone]   Fax:  [phone]  Physical Therapy Treatment  Patient Details  Name: Shawna Marquez MRN: [id number] Date of Birth: 18-Jan-1941 Referring Provider:  Marletta Lor, MD  Encounter Date: 02/08/2015      PT End of Session - 02/08/15 1455    Visit Number 7   Number of Visits 9   Date for PT Re-Evaluation 02/23/15   PT Start Time 1440   PT Stop Time 1520   PT Time Calculation (min) 40 min      Past Medical History  Diagnosis Date  . ALLERGIC RHINITIS 10/12/2007  . BREAST CANCER, HX OF 10/12/2007    right breast diagnosed - 11/2014  . DIVERTICULITIS, HX OF 10/12/2007  . HYPOTHYROIDISM, PRIMARY 10/12/2007  . 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 Wears glasses   . Family history of breast cancer   . Complication of anesthesia     bp will drop,hard to wake up  . Sleep apnea     USES cpap NIGHTLY  . OSTEOARTHRITIS 09/09/2007    back     Past Surgical History  Procedure Laterality Date  . Colovaginal fistula  2007    colon resection  . Abdominal hysterectomy  1977  . Hernia repair  2007    ventral  . Colon surgery  2006    part colectomy  . Mastectomy  1997    lt mast-axillary node dissection  . Tonsillectomy    . Colonoscopy  2007  . Wisdom tooth extraction    . Eye surgery      both cataracts  . Radioactive seed guided mastectomy with axillary sentinel lymph node biopsy Right 11/28/2014    Procedure: RADIOACTIVE SEED GUIDED RIGHT LUMPECTOMY WITH RIGHT AXILLARY SENTINEL LYMPH NODE BIOPSY;  Surgeon: Rolm Bookbinder, MD;  Location: [facility];  Service: General;  Laterality: Right;  . Re-excision of breast cancer,superior margins Right 01/01/2015    Procedure: RIGHT BREAST MARGIN EXCISION;  Surgeon: Rolm Bookbinder, MD;  Location: [facility];  Service: General;  Laterality: Right;  . Portacath placement  Left 01/01/2015    Procedure: INSERTION PORT-A-CATH;  Surgeon: Rolm Bookbinder, MD;  Location: [facility];  Service: General;  Laterality: Left;    There were no vitals taken for this visit.  Visit Diagnosis:  Back pain, chronic  Stiffness of joint, shoulder region, right      Subjective Assessment - 02/08/15 1442    Symptoms Decided to shave my head. Doing good except sore from the neulasta shot, I have to get it in my abdomen. Feel pretty good today. I have pulleys and a finger ladder at home, I'll start using them.                    [facility] Adult PT Treatment/Exercise - 02/08/15 0001    Shoulder Exercises: Pulleys   Flexion 2 minutes   ABduction 2 minutes   Shoulder Exercises: Therapy Ball   Flexion 5 reps  Roll yellow ball up wall   Shoulder Exercises: ROM/Strengthening   Other ROM/Strengthening Exercises Finger Ladder for Rt UE abduction 5 reps up to 23-24  Cuing to decrease scapular compensation   Manual Therapy   Massage Of Rt axillary cord during PROM   Myofascial Release Crosshands technique at Rt chest wall and Rt axilla/upper arm at cording.    Passive ROM In Supine: To Rt shoulder into flexion, abduction,  D2, and neural tension stretch.                        [facility] Goals - 02/08/15 1453    CC Long Term Goal  #4   Title Pt. will report 90% perceived decrease in cording in right axilla.   Status Achieved            Plan - 02/08/15 1455    Clinical Impression Statement Pt feels like her cording is 90% improved and is doing very well with HEP. Also feels her back pain has greatly improved as well.   Pt will benefit from skilled therapeutic intervention in order to improve on the following deficits Decreased range of motion;Pain;Decreased knowledge of precautions   Rehab Potential Excellent   PT Frequency 2x / week   PT Duration 2 weeks   PT Treatment/Interventions Therapeutic exercise;Patient/family education;Manual  techniques;Passive range of motion   PT Next Visit Plan Continue manual techniques for shoulder ROM and to address cording at Rt axilla, continue core strength.   Consulted and Agree with Plan of Care Patient        Problem List Patient Active Problem List   Diagnosis Date Noted  . Family history of breast cancer   . 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 breast 11/17/2014  . Hypothyroidism 10/12/2007  . Allergic rhinitis 10/12/2007  . BREAST CANCER, HX OF 10/12/2007  . DIVERTICULITIS, HX OF 10/12/2007  . OSTEOARTHRITIS 09/09/2007    Golda Acre 02/08/2015, 3:21 PM  [place], [place], [address] Phone: [phone]   Fax:  [phone]

## 2015-02-15 ENCOUNTER — Ambulatory Visit: Payer: Medicare Other | Admitting: Physical Therapy

## 2015-02-15 DIAGNOSIS — M549 Dorsalgia, unspecified: Principal | ICD-10-CM

## 2015-02-15 DIAGNOSIS — M25611 Stiffness of right shoulder, not elsewhere classified: Secondary | ICD-10-CM | POA: Diagnosis not present

## 2015-02-15 DIAGNOSIS — G8929 Other chronic pain: Secondary | ICD-10-CM

## 2015-02-15 NOTE — Therapy (Signed)
[place], [place], [address] Phone: [phone]   Fax:  [phone]  Physical Therapy Treatment  Patient Details  Name: ALLIANNA BEAUBIEN MRN: [id number] Date of Birth: 12/10/1940 Referring Provider:  Marletta Lor, MD  Encounter Date: 02/15/2015      PT End of Session - 02/15/15 1730    Visit Number 8   Number of Visits 9   Date for PT Re-Evaluation 02/23/15   PT Start Time 1430   PT Stop Time 1510   PT Time Calculation (min) 40 min      Past Medical History  Diagnosis Date  . ALLERGIC RHINITIS 10/12/2007  . BREAST CANCER, HX OF 10/12/2007    right breast diagnosed - 11/2014  . DIVERTICULITIS, HX OF 10/12/2007  . HYPOTHYROIDISM, PRIMARY 10/12/2007  . 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 Wears glasses   . Family history of breast cancer   . Complication of anesthesia     bp will drop,hard to wake up  . Sleep apnea     USES cpap NIGHTLY  . OSTEOARTHRITIS 09/09/2007    back     Past Surgical History  Procedure Laterality Date  . Colovaginal fistula  2007    colon resection  . Abdominal hysterectomy  1977  . Hernia repair  2007    ventral  . Colon surgery  2006    part colectomy  . Mastectomy  1997    lt mast-axillary node dissection  . Tonsillectomy    . Colonoscopy  2007  . Wisdom tooth extraction    . Eye surgery      both cataracts  . Radioactive seed guided mastectomy with axillary sentinel lymph node biopsy Right 11/28/2014    Procedure: RADIOACTIVE SEED GUIDED RIGHT LUMPECTOMY WITH RIGHT AXILLARY SENTINEL LYMPH NODE BIOPSY;  Surgeon: Rolm Bookbinder, MD;  Location: [facility];  Service: General;  Laterality: Right;  . Re-excision of breast cancer,superior margins Right 01/01/2015    Procedure: RIGHT BREAST MARGIN EXCISION;  Surgeon: Rolm Bookbinder, MD;  Location: [facility];  Service: General;  Laterality: Right;  . Portacath placement  Left 01/01/2015    Procedure: INSERTION PORT-A-CATH;  Surgeon: Rolm Bookbinder, MD;  Location: [facility];  Service: General;  Laterality: Left;    There were no vitals filed for this visit.  Visit Diagnosis:  Back pain, chronic  Stiffness of joint, shoulder region, right      Subjective Assessment - 02/15/15 1441    Symptoms pt states she is doing well, doing exercise at home and feels is ready to discharge from PT   Currently in Pain? No/denies                  Katina Dung - 02/15/15 0001    Open a tight or new jar No difficulty   Do heavy household chores (wash walls, wash floors) No difficulty   Carry a shopping bag or briefcase No difficulty   Wash your back No difficulty   Use a knife to cut food No difficulty   Recreational activities in which you take some force or impact through your arm, shoulder, or hand (golf, hammering, tennis) No difficulty   During the past week, to what extent has your arm, shoulder or hand problem interfered with your normal social activities with family, friends, neighbors, or groups? Not at all   During the past week, to what extent has your arm, shoulder or hand problem  limited your work or other regular daily activities Not at all   Arm, shoulder, or hand pain. None   Tingling (pins and needles) in your arm, shoulder, or hand None   Difficulty Sleeping No difficulty   DASH Score 0 %             [facility] Adult PT Treatment/Exercise - 2015/02/18 0001    Manual Therapy   Massage Of Rt axillary cord during PROM   Myofascial Release Crosshands technique at Rt chest wall and Rt axilla/upper arm at cording.    Manual Lymphatic Drainage (MLD) manual lymph drainage of right arm and axilla, lateral chest   Passive ROM In Supine: To Rt shoulder into flexion, abduction, D2, and neural tension stretch.                PT Education - Feb 18, 2015 1729    Education provided Yes   Education Details self manual lymph drainage and stretch to  cord in right axilla   Person(s) Educated Patient   Methods Explanation;Demonstration;Tactile cues;Verbal cues   Comprehension Returned demonstration;Verbalized understanding                [facility] Goals - 2015/02/18 1442    CC Long Term Goal  #1   Title Pt. will be independent in home exercise program for right shoulder ROM and bilateral UE strengthening.   Status Achieved   CC Long Term Goal  #2   Title Pt. will be independent in home exercise program for core strengthening and minimizing back pain.   Status Achieved   CC Long Term Goal  #3   Title Pt. will report at least 50% perceived decrease in cording in right axilla.   Status Achieved   CC Long Term Goal  #4   Title Pt. will report 90% perceived decrease in cording in right axilla.   Status Achieved            Plan - February 18, 2015 1731    Clinical Impression Statement Pt could not feel any cording in axilla, but, upon ispection a small thin cord remains. She was treated and instructed to self treat with manual lymph drainage and stretching.  Provided another Tg soft to support right arm lymphatics. Feel cording with continue to resolve on its own, and pt agrees that no more treatment is needed. Will discharge this episode           G-Codes - 2015-02-18 1734    Functional Assessment Tool Used quick dash   Functional Limitation Carrying, moving and handling objects   Carrying, Moving and Handling Objects Goal Status [phone]) At least 1 percent but less than 20 percent impaired, limited or restricted   Carrying, Moving and Handling Objects Discharge Status [phone]) 0 percent impaired, limited or restricted      Problem List Patient Active Problem List   Diagnosis Date Noted  . Family history of breast cancer   . 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 breast 11/17/2014  . Hypothyroidism 10/12/2007  . Allergic rhinitis 10/12/2007  . BREAST CANCER, HX OF 10/12/2007  . DIVERTICULITIS, HX OF  10/12/2007  . OSTEOARTHRITIS 09/09/2007    PHYSICAL THERAPY DISCHARGE SUMMARY  Visits from Start of Care: 8  Current functional level related to goals / functional outcomes: independent   Remaining deficits: Small cord in right axilla that patient is able to self manage   Education / Equipment: Home exercise and self management of cordking Plan: Patient agrees to discharge.  Patient goals were met. Patient is being discharged due to meeting the stated rehab goals.  ?????       Donato Heinz. Owens Shark, PT  02/15/2015, 5:35 PM  [place], [place], [address] Phone: [phone]   Fax:  [phone]

## 2015-02-16 ENCOUNTER — Other Ambulatory Visit: Payer: Self-pay

## 2015-02-16 ENCOUNTER — Telehealth: Payer: Self-pay | Admitting: Hematology and Oncology

## 2015-02-16 NOTE — Telephone Encounter (Signed)
Called and lm with 4/20 appts per pof  anne

## 2015-02-26 ENCOUNTER — Other Ambulatory Visit (HOSPITAL_BASED_OUTPATIENT_CLINIC_OR_DEPARTMENT_OTHER): Payer: Medicare Other

## 2015-02-26 ENCOUNTER — Ambulatory Visit (HOSPITAL_BASED_OUTPATIENT_CLINIC_OR_DEPARTMENT_OTHER): Payer: Medicare Other | Admitting: Hematology and Oncology

## 2015-02-26 ENCOUNTER — Other Ambulatory Visit: Payer: Medicare Other

## 2015-02-26 ENCOUNTER — Ambulatory Visit (HOSPITAL_BASED_OUTPATIENT_CLINIC_OR_DEPARTMENT_OTHER): Payer: Medicare Other

## 2015-02-26 ENCOUNTER — Ambulatory Visit: Payer: Medicare Other

## 2015-02-26 ENCOUNTER — Telehealth: Payer: Self-pay | Admitting: Hematology and Oncology

## 2015-02-26 VITALS — BP 116/50 | HR 79 | Temp 98.4°F | Resp 18 | Ht 61.0 in | Wt 174.7 lb

## 2015-02-26 DIAGNOSIS — D6481 Anemia due to antineoplastic chemotherapy: Secondary | ICD-10-CM | POA: Diagnosis not present

## 2015-02-26 DIAGNOSIS — Z5111 Encounter for antineoplastic chemotherapy: Secondary | ICD-10-CM | POA: Diagnosis not present

## 2015-02-26 DIAGNOSIS — C50211 Malignant neoplasm of upper-inner quadrant of right female breast: Secondary | ICD-10-CM

## 2015-02-26 DIAGNOSIS — Z170 Estrogen receptor positive status [ER+]: Secondary | ICD-10-CM

## 2015-02-26 DIAGNOSIS — Z95828 Presence of other vascular implants and grafts: Secondary | ICD-10-CM

## 2015-02-26 LAB — CBC WITH DIFFERENTIAL/PLATELET
BASO%: 0.7 % (ref 0.0–2.0)
BASOS ABS: 0.1 10*3/uL (ref 0.0–0.1)
EOS%: 0.3 % (ref 0.0–7.0)
Eosinophils Absolute: 0 10*3/uL (ref 0.0–0.5)
HCT: 34.5 % — ABNORMAL LOW (ref 34.8–46.6)
HEMOGLOBIN: 11.3 g/dL — AB (ref 11.6–15.9)
LYMPH#: 1.7 10*3/uL (ref 0.9–3.3)
LYMPH%: 15.9 % (ref 14.0–49.7)
MCH: 31.3 pg (ref 25.1–34.0)
MCHC: 32.7 g/dL (ref 31.5–36.0)
MCV: 95.7 fL (ref 79.5–101.0)
MONO#: 1.4 10*3/uL — AB (ref 0.1–0.9)
MONO%: 13.4 % (ref 0.0–14.0)
NEUT%: 69.7 % (ref 38.4–76.8)
NEUTROS ABS: 7.4 10*3/uL — AB (ref 1.5–6.5)
Platelets: 312 10*3/uL (ref 145–400)
RBC: 3.6 10*6/uL — ABNORMAL LOW (ref 3.70–5.45)
RDW: 16.1 % — ABNORMAL HIGH (ref 11.2–14.5)
WBC: 10.6 10*3/uL — ABNORMAL HIGH (ref 3.9–10.3)

## 2015-02-26 LAB — COMPREHENSIVE METABOLIC PANEL (CC13)
ALBUMIN: 3.2 g/dL — AB (ref 3.5–5.0)
ALT: 20 U/L (ref 0–55)
ANION GAP: 9 meq/L (ref 3–11)
AST: 18 U/L (ref 5–34)
Alkaline Phosphatase: 67 U/L (ref 40–150)
BUN: 14.3 mg/dL (ref 7.0–26.0)
CALCIUM: 8.7 mg/dL (ref 8.4–10.4)
CHLORIDE: 107 meq/L (ref 98–109)
CO2: 25 meq/L (ref 22–29)
Creatinine: 0.6 mg/dL (ref 0.6–1.1)
EGFR: 88 mL/min/{1.73_m2} — ABNORMAL LOW (ref >90)
Glucose: 92 mg/dl (ref 70–140)
Potassium: 3.9 mEq/L (ref 3.5–5.1)
SODIUM: 141 meq/L (ref 136–145)
TOTAL PROTEIN: 6 g/dL — AB (ref 6.4–8.3)
Total Bilirubin: 0.37 mg/dL (ref 0.20–1.20)

## 2015-02-26 MED ORDER — HEPARIN SOD (PORK) LOCK FLUSH 100 UNIT/ML IV SOLN
500.0000 [IU] | Freq: Once | INTRAVENOUS | Status: AC | PRN
  Administered 2015-02-26: 500 [IU]
  Filled 2015-02-26: qty 5

## 2015-02-26 MED ORDER — SODIUM CHLORIDE 0.9 % IV SOLN
Freq: Once | INTRAVENOUS | Status: AC
  Administered 2015-02-26: 12:00:00 via INTRAVENOUS
  Filled 2015-02-26: qty 8

## 2015-02-26 MED ORDER — SODIUM CHLORIDE 0.9 % IV SOLN
Freq: Once | INTRAVENOUS | Status: AC
  Administered 2015-02-26: 12:00:00 via INTRAVENOUS

## 2015-02-26 MED ORDER — SODIUM CHLORIDE 0.9 % IV SOLN
600.0000 mg/m2 | Freq: Once | INTRAVENOUS | Status: AC
  Administered 2015-02-26: 1120 mg via INTRAVENOUS
  Filled 2015-02-26: qty 56

## 2015-02-26 MED ORDER — DOCETAXEL CHEMO INJECTION 160 MG/16ML
75.0000 mg/m2 | Freq: Once | INTRAVENOUS | Status: AC
  Administered 2015-02-26: 140 mg via INTRAVENOUS
  Filled 2015-02-26: qty 14

## 2015-02-26 MED ORDER — SODIUM CHLORIDE 0.9 % IJ SOLN
10.0000 mL | INTRAMUSCULAR | Status: DC | PRN
  Administered 2015-02-26: 10 mL
  Filled 2015-02-26: qty 10

## 2015-02-26 MED ORDER — SODIUM CHLORIDE 0.9 % IJ SOLN
10.0000 mL | INTRAMUSCULAR | Status: DC | PRN
  Administered 2015-02-26: 10 mL via INTRAVENOUS
  Filled 2015-02-26: qty 10

## 2015-02-26 NOTE — Patient Instructions (Signed)

## 2015-02-26 NOTE — Patient Instructions (Signed)
[facility] Discharge Instructions for Patients Receiving Chemotherapy  Today you received the following chemotherapy agents taxotere/cytoxan.   To help prevent nausea and vomiting after your treatment, we encourage you to take your nausea medication as directed.    If you develop nausea and vomiting that is not controlled by your nausea medication, call the clinic.   BELOW ARE SYMPTOMS THAT SHOULD BE REPORTED IMMEDIATELY:  *FEVER GREATER THAN 100.5 F  *CHILLS WITH OR WITHOUT FEVER  NAUSEA AND VOMITING THAT IS NOT CONTROLLED WITH YOUR NAUSEA MEDICATION  *UNUSUAL SHORTNESS OF BREATH  *UNUSUAL BRUISING OR BLEEDING  TENDERNESS IN MOUTH AND THROAT WITH OR WITHOUT PRESENCE OF ULCERS  *URINARY PROBLEMS  *BOWEL PROBLEMS  UNUSUAL RASH Items with * indicate a potential emergency and should be followed up as soon as possible.  Feel free to call the clinic you have any questions or concerns. The clinic phone number is [phone].  

## 2015-02-26 NOTE — Telephone Encounter (Signed)
Appointments made and avs will be printed for patient in chemo today

## 2015-02-26 NOTE — Progress Notes (Signed)
Patient Care Team: Marletta Lor, MD as PCP - General Rolm Bookbinder, MD as Consulting Physician (General Surgery) Nicholas Lose, MD as Consulting Physician (Hematology and Oncology) Thea Silversmith, MD as Consulting Physician (Radiation Oncology) Holley Bouche, NP as Nurse Practitioner (Nurse Practitioner)  DIAGNOSIS: 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 breast   Staging form: Breast, AJCC 7th Edition     Clinical stage from 11/22/2014: Stage IA (T1b, N0, M0) - Unsigned     Pathologic: Stage IIA (T1c, N1a, cM0) - Signed by Seward Grater, MD on 12/26/2014       Staging comments: Staged on final lumpectomy specimen read by Dr. Saralyn Pilar.    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    Mammogram 9 mm mass at 1:00 position   11/15/2014 Initial Biopsy Right breast needle biopsy: Grade 2 invasive ductal carcinoma with DCIS with lymphovascular invasion ER/PR positive HER-2 negative Ki-67 20%   11/28/2014 Surgery right breast lumpectomy: 1.7 cm invasive ductal carcinoma with DCIS, focal vascular involvement, posterior margin involvement, 1/2 sentinel node positive, Mammaprint Luminal type, High Risk   01/15/2015 -  Chemotherapy TC X 4 adjuvant chemo    CHIEF COMPLIANT: Cycle 3 Taxotere Cytoxan today  INTERVAL HISTORY: TACORA ATHANAS is a 74 year old lady with above-mentioned history of left breast cancer treated with lumpectomy and is on adjuvant chemotherapy. Today is cycle #3 of Taxotere and Cytoxan. She has tolerated cycle 2 fairly well. She now has complete alopecia. She did not have any nausea vomiting or neuropathy. She did have bone pain that lasted for 5 days. But taking Claritin-D with Tylenol has helped her. Her symptoms are not too severe.  REVIEW OF SYSTEMS:   Constitutional: Denies fevers,  chills or abnormal weight loss Eyes: Denies blurriness of vision Ears, nose, mouth, throat, and face: Denies mucositis or sore throat Respiratory: Denies cough, dyspnea or wheezes Cardiovascular: Denies palpitation, chest discomfort or lower extremity swelling Gastrointestinal:  Denies nausea, heartburn or change in bowel habits Skin: Denies abnormal skin rashes Lymphatics: Denies new lymphadenopathy or easy bruising Neurological:Denies numbness, tingling or new weaknesses Behavioral/Psych: Mood is stable, no new changes  Breast:  denies any pain or lumps or nodules in either breasts All other systems were reviewed with the patient and are negative.  I have reviewed the past medical history, past surgical history, social history and family history with the patient and they are unchanged from previous note.  ALLERGIES:  has No Known Allergies.  MEDICATIONS:  Current Outpatient Prescriptions  Medication Sig Dispense Refill  . aspirin EC 81 MG tablet Take 81 mg by mouth at bedtime.    . Calcium Citrate-Vitamin D (CALCIUM CITRATE + D3 PO) Take 1 tablet by mouth 2 (two) times daily. Calcium 500 mg, Vitamin D 800 units    . cholecalciferol (VITAMIN D) 1000 UNITS tablet Take 1,000 Units by mouth daily with lunch.     . Coenzyme Q10 (COQ10) 200 MG CAPS Take 200 mg by mouth every other day.    Marland Kitchen dexamethasone (DECADRON) 4 MG tablet Take 1 tablet (4 mg total) by mouth 2 (two) times daily. Start the day before Taxotere. Then again the day after chemo for 3 days. 30 tablet 1  . fluticasone (FLONASE) 50 MCG/ACT nasal spray Place 1 spray into both nostrils 2 (two) times daily. [place]  g 5  . folic acid (FOLVITE) 893 MCG tablet Take 400 mcg by mouth daily with lunch.     . hydrocortisone cream 1 % Apply 1 application topically daily as needed for itching (eczema).    Marland Kitchen levothyroxine (SYNTHROID, LEVOTHROID) 125 MCG tablet Take 1 tablet (125 mcg total) by mouth daily before breakfast. 90 tablet 3  .  lidocaine-prilocaine (EMLA) cream Apply to affected area once 30 g 3  . LORazepam (ATIVAN) 0.5 MG tablet Take 1 tablet (0.5 mg total) by mouth every 6 (six) hours as needed (Nausea or vomiting). 30 tablet 0  . Multiple Vitamin (MULTIVITAMIN WITH MINERALS) TABS tablet Take 1 tablet by mouth daily with lunch.    . Omega-3 Fatty Acids (FISH OIL) 1000 MG CAPS Take 1,000 mg by mouth daily with lunch.    . ondansetron (ZOFRAN) 8 MG tablet Take 1 tablet (8 mg total) by mouth 2 (two) times daily. Start the day after chemo for 3 days. Then take as needed for nausea or vomiting. 30 tablet 1  . oxyCODONE-acetaminophen (PERCOCET) 10-325 MG per tablet Take 1 tablet by mouth every 6 (six) hours as needed for pain. 20 tablet 0  . oxyCODONE-acetaminophen (PERCOCET) 10-325 MG per tablet Take 1 tablet by mouth every 6 (six) hours as needed for pain. 20 tablet 0  . prochlorperazine (COMPAZINE) 10 MG tablet Take 1 tablet (10 mg total) by mouth every 6 (six) hours as needed (Nausea or vomiting). 30 tablet 1  . Turmeric 450 MG CAPS Take 450 mg by mouth daily with lunch.     . vitamin C (ASCORBIC ACID) 500 MG tablet Take 500 mg by mouth 2 (two) times daily.     No current facility-administered medications for this visit.    PHYSICAL EXAMINATION: ECOG PERFORMANCE STATUS: 1 - Symptomatic but completely ambulatory  Filed Vitals:   02/26/15 1010  BP: 116/50  Pulse: 79  Temp: 98.4 F (36.9 C)  Resp: 18   Filed Weights   02/26/15 1010  Weight: 174 lb 11.2 oz (79.243 kg)    GENERAL:alert, no distress and comfortable SKIN: skin color, texture, turgor are normal, no rashes or significant lesions EYES: normal, Conjunctiva are pink and non-injected, sclera clear OROPHARYNX:no exudate, no erythema and lips, buccal mucosa, and tongue normal  NECK: supple, thyroid normal size, non-tender, without nodularity LYMPH:  no palpable lymphadenopathy in the cervical, axillary or inguinal LUNGS: clear to auscultation and  percussion with normal breathing effort HEART: regular rate & rhythm and no murmurs and no lower extremity edema ABDOMEN:abdomen soft, non-tender and normal bowel sounds Musculoskeletal:no cyanosis of digits and no clubbing  NEURO: alert & oriented x 3 with fluent speech, no focal motor/sensory deficits  LABORATORY DATA:  I have reviewed the data as listed   Chemistry      Component Value Date/Time   NA 142 02/05/2015 0947   NA 142 12/28/2014 1533   K 4.1 02/05/2015 0947   K 4.1 12/28/2014 1533   CL 104 12/28/2014 1533   CO2 24 02/05/2015 0947   CO2 30 12/28/2014 1533   BUN 15.5 02/05/2015 0947   BUN 11 12/28/2014 1533   CREATININE 0.7 02/05/2015 0947   CREATININE 0.76 12/28/2014 1533      Component Value Date/Time   CALCIUM 9.2 02/05/2015 0947   CALCIUM 9.3 12/28/2014 1533   ALKPHOS 71 02/05/2015 0947   ALKPHOS 67 02/24/2014 1013   AST 21 02/05/2015 0947   AST 33 02/24/2014 1013   ALT 23  02/05/2015 0947   ALT 31 02/24/2014 1013   BILITOT 0.36 02/05/2015 0947   BILITOT 0.7 02/24/2014 1013       Lab Results  Component Value Date   WBC 10.6* 02/26/2015   HGB 11.3* 02/26/2015   HCT 34.5* 02/26/2015   MCV 95.7 02/26/2015   PLT 312 02/26/2015   NEUTROABS 7.4* 02/26/2015    ASSESSMENT & PLAN:  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 Bone and muscle pain related to Neulasta: Improved by taking Claritin-D along with Tylenol 2. Loss of taste: That last for 1 week after chemotherapy 3.  Fatigue related to chemotherapy 4. Alopecia 5. Anemia grade 1   Denies any neuropathy  I reviewed her blood counts and there are adequate for treatment. Monitoring her closely for chemotherapy toxicities. Patient last go to [place] after the fourth cycle of chemotherapy. When she returns fracture from [place], she will undergo radiation therapy. We will try to get her an appointment for radiation oncology first week of May.  Return to clinic in 3 weeks for cycle 4 (last chemo)    No orders of the defined types were placed in this encounter.   The patient has a good understanding of the overall plan. she agrees with it. She will call with any problems that may develop before her next visit here.   Rulon Eisenmenger, MD

## 2015-02-26 NOTE — Assessment & Plan Note (Signed)
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, focal vascular involvement, posterior margin involvement, 1/2 sentinel node positive, T1 cN1 M0 stage II a, minimal present luminal type B, high risk, disease free survival with chemotherapy and hormonal therapy 88% versus 76% with hormonal therapy alone  Treatment Plan: Adjuvant chemotherapy with Taxotere and Cytoxan every 3 weeks 4 cycles started 01/15/15 Today is cycle 3 day 1  Toxicities to chemotherapy: 1. Bone and muscle pain related to Neulasta: Improved by taking Claritin-D along with Tylenol 2. Loss of taste: That last for 1 week after chemotherapy 3. Fatigue related to chemotherapy 4. Alopecia   Denies any neuropathy  I reviewed her blood counts and there are adequate for treatment. Monitoring her closely for chemotherapy toxicities.  Return to clinic in 3 weeks for cycle 4 (last chemo)

## 2015-02-27 ENCOUNTER — Ambulatory Visit (HOSPITAL_BASED_OUTPATIENT_CLINIC_OR_DEPARTMENT_OTHER): Payer: Medicare Other

## 2015-02-27 ENCOUNTER — Ambulatory Visit: Payer: Medicare Other

## 2015-02-27 DIAGNOSIS — C50211 Malignant neoplasm of upper-inner quadrant of right female breast: Secondary | ICD-10-CM | POA: Diagnosis not present

## 2015-02-27 DIAGNOSIS — Z5189 Encounter for other specified aftercare: Secondary | ICD-10-CM | POA: Diagnosis not present

## 2015-02-27 MED ORDER — PEGFILGRASTIM INJECTION 6 MG/0.6ML ~~LOC~~
6.0000 mg | PREFILLED_SYRINGE | Freq: Once | SUBCUTANEOUS | Status: AC
Start: 2015-02-27 — End: 2015-02-27
  Administered 2015-02-27: 6 mg via SUBCUTANEOUS
  Filled 2015-02-27: qty 0.6

## 2015-03-05 ENCOUNTER — Other Ambulatory Visit: Payer: Self-pay | Admitting: Internal Medicine

## 2015-03-15 ENCOUNTER — Other Ambulatory Visit: Payer: Self-pay | Admitting: *Deleted

## 2015-03-15 DIAGNOSIS — C50211 Malignant neoplasm of upper-inner quadrant of right female breast: Secondary | ICD-10-CM

## 2015-03-19 ENCOUNTER — Other Ambulatory Visit (HOSPITAL_BASED_OUTPATIENT_CLINIC_OR_DEPARTMENT_OTHER): Payer: Medicare Other

## 2015-03-19 ENCOUNTER — Telehealth: Payer: Self-pay | Admitting: Hematology and Oncology

## 2015-03-19 ENCOUNTER — Ambulatory Visit (HOSPITAL_BASED_OUTPATIENT_CLINIC_OR_DEPARTMENT_OTHER): Payer: Medicare Other

## 2015-03-19 ENCOUNTER — Other Ambulatory Visit: Payer: Medicare Other

## 2015-03-19 ENCOUNTER — Ambulatory Visit: Payer: Medicare Other

## 2015-03-19 ENCOUNTER — Ambulatory Visit (HOSPITAL_BASED_OUTPATIENT_CLINIC_OR_DEPARTMENT_OTHER): Payer: Medicare Other | Admitting: Hematology and Oncology

## 2015-03-19 VITALS — BP 110/59 | HR 95 | Temp 97.6°F | Resp 18 | Ht 61.0 in | Wt 176.9 lb

## 2015-03-19 VITALS — BP 110/59 | HR 95 | Temp 97.6°F | Wt 176.9 lb

## 2015-03-19 DIAGNOSIS — D6481 Anemia due to antineoplastic chemotherapy: Secondary | ICD-10-CM

## 2015-03-19 DIAGNOSIS — Z5111 Encounter for antineoplastic chemotherapy: Secondary | ICD-10-CM | POA: Diagnosis present

## 2015-03-19 DIAGNOSIS — Z170 Estrogen receptor positive status [ER+]: Secondary | ICD-10-CM | POA: Diagnosis not present

## 2015-03-19 DIAGNOSIS — C50211 Malignant neoplasm of upper-inner quadrant of right female breast: Secondary | ICD-10-CM | POA: Diagnosis not present

## 2015-03-19 DIAGNOSIS — Z95828 Presence of other vascular implants and grafts: Secondary | ICD-10-CM

## 2015-03-19 LAB — CBC WITH DIFFERENTIAL/PLATELET
BASO%: 0.8 % (ref 0.0–2.0)
Basophils Absolute: 0.1 10*3/uL (ref 0.0–0.1)
EOS%: 0.3 % (ref 0.0–7.0)
Eosinophils Absolute: 0 10*3/uL (ref 0.0–0.5)
HEMATOCRIT: 33.8 % — AB (ref 34.8–46.6)
HGB: 11.2 g/dL — ABNORMAL LOW (ref 11.6–15.9)
LYMPH#: 1.7 10*3/uL (ref 0.9–3.3)
LYMPH%: 20.7 % (ref 14.0–49.7)
MCH: 31.4 pg (ref 25.1–34.0)
MCHC: 33 g/dL (ref 31.5–36.0)
MCV: 95.2 fL (ref 79.5–101.0)
MONO#: 1 10*3/uL — AB (ref 0.1–0.9)
MONO%: 12.6 % (ref 0.0–14.0)
NEUT#: 5.3 10*3/uL (ref 1.5–6.5)
NEUT%: 65.6 % (ref 38.4–76.8)
Platelets: 290 10*3/uL (ref 145–400)
RBC: 3.55 10*6/uL — AB (ref 3.70–5.45)
RDW: 17.4 % — AB (ref 11.2–14.5)
WBC: 8 10*3/uL (ref 3.9–10.3)

## 2015-03-19 LAB — COMPREHENSIVE METABOLIC PANEL (CC13)
ALK PHOS: 63 U/L (ref 40–150)
ALT: 21 U/L (ref 0–55)
AST: 23 U/L (ref 5–34)
Albumin: 3.1 g/dL — ABNORMAL LOW (ref 3.5–5.0)
Anion Gap: 8 mEq/L (ref 3–11)
BILIRUBIN TOTAL: 0.49 mg/dL (ref 0.20–1.20)
BUN: 15.4 mg/dL (ref 7.0–26.0)
CALCIUM: 8.6 mg/dL (ref 8.4–10.4)
CO2: 24 mEq/L (ref 22–29)
CREATININE: 0.6 mg/dL (ref 0.6–1.1)
Chloride: 109 mEq/L (ref 98–109)
EGFR: 88 mL/min/{1.73_m2} — AB (ref >90)
Glucose: 90 mg/dl (ref 70–140)
Potassium: 3.9 mEq/L (ref 3.5–5.1)
SODIUM: 141 meq/L (ref 136–145)
Total Protein: 5.8 g/dL — ABNORMAL LOW (ref 6.4–8.3)

## 2015-03-19 MED ORDER — HEPARIN SOD (PORK) LOCK FLUSH 100 UNIT/ML IV SOLN
500.0000 [IU] | Freq: Once | INTRAVENOUS | Status: AC | PRN
  Administered 2015-03-19: 500 [IU]
  Filled 2015-03-19: qty 5

## 2015-03-19 MED ORDER — DOCETAXEL CHEMO INJECTION 160 MG/16ML
75.0000 mg/m2 | Freq: Once | INTRAVENOUS | Status: AC
  Administered 2015-03-19: 140 mg via INTRAVENOUS
  Filled 2015-03-19: qty 14

## 2015-03-19 MED ORDER — SODIUM CHLORIDE 0.9 % IV SOLN
Freq: Once | INTRAVENOUS | Status: AC
  Administered 2015-03-19: 11:00:00 via INTRAVENOUS

## 2015-03-19 MED ORDER — SODIUM CHLORIDE 0.9 % IV SOLN
Freq: Once | INTRAVENOUS | Status: AC
  Administered 2015-03-19: 12:00:00 via INTRAVENOUS
  Filled 2015-03-19: qty 8

## 2015-03-19 MED ORDER — SODIUM CHLORIDE 0.9 % IV SOLN
600.0000 mg/m2 | Freq: Once | INTRAVENOUS | Status: AC
  Administered 2015-03-19: 1120 mg via INTRAVENOUS
  Filled 2015-03-19: qty 56

## 2015-03-19 MED ORDER — SODIUM CHLORIDE 0.9 % IJ SOLN
10.0000 mL | INTRAMUSCULAR | Status: DC | PRN
  Administered 2015-03-19: 10 mL
  Filled 2015-03-19: qty 10

## 2015-03-19 MED ORDER — SODIUM CHLORIDE 0.9 % IJ SOLN
10.0000 mL | INTRAMUSCULAR | Status: DC | PRN
Start: 2015-03-19 — End: 2015-03-19
  Administered 2015-03-19: 10 mL via INTRAVENOUS
  Filled 2015-03-19: qty 10

## 2015-03-19 NOTE — Assessment & Plan Note (Signed)
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, focal vascular involvement, posterior margin involvement, 1/2 sentinel node positive, T1 cN1 M0 stage II a, minimal present luminal type B, high risk, disease free survival with chemotherapy and hormonal therapy 88% versus 76% with hormonal therapy alone  Treatment Plan: Adjuvant chemotherapy with Taxotere and Cytoxan every 3 weeks 4 cycles started 01/15/15, followed by adjuvant radiation therapy and followed by antiestrogen therapy with anastrozole 5 years Current treatment: Today is cycle 4 day 1 and is the last dose of chemotherapy  Toxicities to chemotherapy: 1. Bone and muscle pain related to Neulasta: Improved by taking Claritin-D along with Tylenol 2. Loss of taste: That last for 1 week after chemotherapy 3. Fatigue related to chemotherapy 4. Alopecia 5. Anemia grade 1   Denies any neuropathy  I reviewed her blood counts and they are adequate for treatment. Monitoring her closely for chemotherapy toxicities. Patient last go to [place] after the fourth cycle of chemotherapy. When she returns fracture from [place], she will undergo radiation therapy. We will try to get her an appointment for radiation oncology first week of May.

## 2015-03-19 NOTE — Progress Notes (Signed)
Patient Care Team: Marletta Lor, MD as PCP - General Rolm Bookbinder, MD as Consulting Physician (General Surgery) Nicholas Lose, MD as Consulting Physician (Hematology and Oncology) Thea Silversmith, MD as Consulting Physician (Radiation Oncology) Holley Bouche, NP as Nurse Practitioner (Nurse Practitioner)  DIAGNOSIS: 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 breast   Staging form: Breast, AJCC 7th Edition     Clinical stage from 11/22/2014: Stage IA (T1b, N0, M0) - Unsigned     Pathologic: Stage IIA (T1c, N1a, cM0) - Signed by Seward Grater, MD on 12/26/2014       Staging comments: Staged on final lumpectomy specimen read by Dr. Saralyn Pilar.    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    Mammogram 9 mm mass at 1:00 position   11/15/2014 Initial Biopsy Right breast needle biopsy: Grade 2 invasive ductal carcinoma with DCIS with lymphovascular invasion ER/PR positive HER-2 negative Ki-67 20%   11/28/2014 Surgery right breast lumpectomy: 1.7 cm invasive ductal carcinoma with DCIS, focal vascular involvement, posterior margin involvement, 1/2 sentinel node positive, Mammaprint Luminal type, High Risk   01/15/2015 -  Chemotherapy TC X 4 adjuvant chemo    CHIEF COMPLIANT: cycle 4 Taxotere Cytoxan  INTERVAL HISTORY: Shawna Marquez is a 74 year old above-mentioned history of right-sided breast cancer who is currently on adjuvant chemotherapy with Taxotere and Cytoxan. Today is a fourth cycle of treatment. She reports no major problems or concerns. She did have alopecia. Denies any nausea or vomiting. Denies any mouth sores.  REVIEW OF SYSTEMS:   Constitutional: Denies fevers, chills or abnormal weight loss Eyes: Denies blurriness of vision Ears, nose, mouth, throat, and face: Denies mucositis or sore  throat Respiratory: Denies cough, dyspnea or wheezes Cardiovascular: Denies palpitation, chest discomfort or lower extremity swelling Gastrointestinal:  Denies nausea, heartburn or change in bowel habits Skin: Denies abnormal skin rashes Lymphatics: Denies new lymphadenopathy or easy bruising Neurological:Denies numbness, tingling or new weaknesses Behavioral/Psych: Mood is stable, no new changes  Breast:  denies any pain or lumps or nodules in either breasts All other systems were reviewed with the patient and are negative.  I have reviewed the past medical history, past surgical history, social history and family history with the patient and they are unchanged from previous note.  ALLERGIES:  has No Known Allergies.  MEDICATIONS:  Current Outpatient Prescriptions  Medication Sig Dispense Refill  . dexamethasone (DECADRON) 4 MG tablet Take 1 tablet (4 mg total) by mouth 2 (two) times daily. Start the day before Taxotere. Then again the day after chemo for 3 days. 30 tablet 1  . fluticasone (FLONASE) 50 MCG/ACT nasal spray INSTILL 1 SPRAY INTO EACH NOSTRIL TWICE DAILY 48 g 3  . levothyroxine (SYNTHROID, LEVOTHROID) 125 MCG tablet Take 1 tablet (125 mcg total) by mouth daily before breakfast. 90 tablet 3  . lidocaine-prilocaine (EMLA) cream Apply to affected area once 30 g 3  . LORazepam (ATIVAN) 0.5 MG tablet Take 1 tablet (0.5 mg total) by mouth every 6 (six) hours as needed (Nausea or vomiting). 30 tablet 0  . ondansetron (ZOFRAN) 8 MG tablet Take 1 tablet (8 mg total) by mouth 2 (two) times daily. Start the day after chemo for 3 days. Then take as needed for nausea or vomiting. 30 tablet 1  . oxyCODONE-acetaminophen (PERCOCET) 10-325 MG per tablet  Take 1 tablet by mouth every 6 (six) hours as needed for pain. 20 tablet 0  . oxyCODONE-acetaminophen (PERCOCET) 10-325 MG per tablet Take 1 tablet by mouth every 6 (six) hours as needed for pain. 20 tablet 0  . prochlorperazine (COMPAZINE) 10  MG tablet Take 1 tablet (10 mg total) by mouth every 6 (six) hours as needed (Nausea or vomiting). 30 tablet 1  . aspirin EC 81 MG tablet Take 81 mg by mouth at bedtime.    . Calcium Citrate-Vitamin D (CALCIUM CITRATE + D3 PO) Take 1 tablet by mouth 2 (two) times daily. Calcium 500 mg, Vitamin D 800 units    . cholecalciferol (VITAMIN D) 1000 UNITS tablet Take 1,000 Units by mouth daily with lunch.     . Coenzyme Q10 (COQ10) 200 MG CAPS Take 200 mg by mouth every other day.    . folic acid (FOLVITE) 546 MCG tablet Take 400 mcg by mouth daily with lunch.     . hydrocortisone cream 1 % Apply 1 application topically daily as needed for itching (eczema).    . Multiple Vitamin (MULTIVITAMIN WITH MINERALS) TABS tablet Take 1 tablet by mouth daily with lunch.    . Omega-3 Fatty Acids (FISH OIL) 1000 MG CAPS Take 1,000 mg by mouth daily with lunch.    . Turmeric 450 MG CAPS Take 450 mg by mouth daily with lunch.     . vitamin C (ASCORBIC ACID) 500 MG tablet Take 500 mg by mouth 2 (two) times daily.     No current facility-administered medications for this visit.   Facility-Administered Medications Ordered in Other Visits  Medication Dose Route Frequency Provider Last Rate Last Dose  . cyclophosphamide (CYTOXAN) 1,120 mg in sodium chloride 0.9 % 250 mL chemo infusion  600 mg/m2 (Treatment Plan Actual) Intravenous Once Nicholas Lose, MD      . DOCEtaxel (TAXOTERE) 140 mg in dextrose 5 % 250 mL chemo infusion  75 mg/m2 (Treatment Plan Actual) Intravenous Once Nicholas Lose, MD 264 mL/hr at 03/19/15 1151 140 mg at 03/19/15 1151  . heparin lock flush 100 unit/mL  500 Units Intracatheter Once PRN Nicholas Lose, MD      . sodium chloride 0.9 % injection 10 mL  10 mL Intracatheter PRN Nicholas Lose, MD        PHYSICAL EXAMINATION: ECOG PERFORMANCE STATUS: 1 - Symptomatic but completely ambulatory  Filed Vitals:   03/19/15 0937  BP: 110/59  Pulse: 95  Temp: 97.6 F (36.4 C)  Resp: 18   Filed Weights    03/19/15 0937  Weight: 176 lb 14 oz (80.23 kg)    GENERAL:alert, no distress and comfortable SKIN: skin color, texture, turgor are normal, no rashes or significant lesions EYES: normal, Conjunctiva are pink and non-injected, sclera clear OROPHARYNX:no exudate, no erythema and lips, buccal mucosa, and tongue normal  NECK: supple, thyroid normal size, non-tender, without nodularity LYMPH:  no palpable lymphadenopathy in the cervical, axillary or inguinal LUNGS: clear to auscultation and percussion with normal breathing effort HEART: regular rate & rhythm and no murmurs and no lower extremity edema ABDOMEN:abdomen soft, non-tender and normal bowel sounds Musculoskeletal:no cyanosis of digits and no clubbing  NEURO: alert & oriented x 3 with fluent speech, no focal motor/sensory deficits  LABORATORY DATA:  I have reviewed the data as listed   Chemistry      Component Value Date/Time   NA 141 03/19/2015 0919   NA 142 12/28/2014 1533   K 3.9 03/19/2015 0919  K 4.1 12/28/2014 1533   CL 104 12/28/2014 1533   CO2 24 03/19/2015 0919   CO2 30 12/28/2014 1533   BUN 15.4 03/19/2015 0919   BUN 11 12/28/2014 1533   CREATININE 0.6 03/19/2015 0919   CREATININE 0.76 12/28/2014 1533      Component Value Date/Time   CALCIUM 8.6 03/19/2015 0919   CALCIUM 9.3 12/28/2014 1533   ALKPHOS 63 03/19/2015 0919   ALKPHOS 67 02/24/2014 1013   AST 23 03/19/2015 0919   AST 33 02/24/2014 1013   ALT 21 03/19/2015 0919   ALT 31 02/24/2014 1013   BILITOT 0.49 03/19/2015 0919   BILITOT 0.7 02/24/2014 1013       Lab Results  Component Value Date   WBC 8.0 03/19/2015   HGB 11.2* 03/19/2015   HCT 33.8* 03/19/2015   MCV 95.2 03/19/2015   PLT 290 03/19/2015   NEUTROABS 5.3 03/19/2015   ASSESSMENT & PLAN:  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 breast 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, focal vascular involvement, posterior margin involvement, 1/2 sentinel node  positive, T1 cN1 M0 stage II a, minimal present luminal type B, high risk, disease free survival with chemotherapy and hormonal therapy 88% versus 76% with hormonal therapy alone  Treatment Plan: Adjuvant chemotherapy with Taxotere and Cytoxan every 3 weeks 4 cycles started 01/15/15, followed by adjuvant radiation therapy and followed by antiestrogen therapy with anastrozole 5 years Current treatment: Today is cycle 4 day 1 and is the last dose of chemotherapy  Toxicities to chemotherapy: 1. Bone and muscle pain related to Neulasta: Improved by taking Claritin-D along with Tylenol 2. Loss of taste: That last for 1 week after chemotherapy 3. Fatigue related to chemotherapy 4. Alopecia 5. Anemia grade 1   Denies any neuropathy  I reviewed her blood counts and they are adequate for treatment. Monitoring her closely for chemotherapy toxicities. Patient last go to [place] after the fourth cycle of chemotherapy. When she returns from [place], she will undergo radiation therapy. We will try to get her an appointment for radiation oncology first week of May.  No orders of the defined types were placed in this encounter.   The patient has a good understanding of the overall plan. she agrees with it. She will call with any problems that may develop before her next visit here.   Rulon Eisenmenger, MD

## 2015-03-19 NOTE — Telephone Encounter (Signed)
Appointments made and avs will be printed for patient in chemo °

## 2015-03-19 NOTE — Patient Instructions (Signed)

## 2015-03-19 NOTE — Patient Instructions (Signed)
[facility] Discharge Instructions for Patients Receiving Chemotherapy  Today you received the following chemotherapy agents: taxotere, cytoxan  To help prevent nausea and vomiting after your treatment, we encourage you to take your nausea medication.  Take it as often as prescribed.     If you develop nausea and vomiting that is not controlled by your nausea medication, call the clinic. If it is after clinic hours your family physician or the after hours number for the clinic or go to the Emergency Department.   BELOW ARE SYMPTOMS THAT SHOULD BE REPORTED IMMEDIATELY:  *FEVER GREATER THAN 100.5 F  *CHILLS WITH OR WITHOUT FEVER  NAUSEA AND VOMITING THAT IS NOT CONTROLLED WITH YOUR NAUSEA MEDICATION  *UNUSUAL SHORTNESS OF BREATH  *UNUSUAL BRUISING OR BLEEDING  TENDERNESS IN MOUTH AND THROAT WITH OR WITHOUT PRESENCE OF ULCERS  *URINARY PROBLEMS  *BOWEL PROBLEMS  UNUSUAL RASH Items with * indicate a potential emergency and should be followed up as soon as possible.  Feel free to call the clinic you have any questions or concerns. The clinic phone number is [phone].   I have been informed and understand all the instructions given to me. I know to contact the clinic, my physician, or go to the Emergency Department if any problems should occur. I do not have any questions at this time, but understand that I may call the clinic during office hours   should I have any questions or need assistance in obtaining follow up care.    __________________________________________  _____________  __________ Signature of Patient or Authorized Representative            Date                   Time    __________________________________________ Nurse's Signature    

## 2015-03-20 ENCOUNTER — Ambulatory Visit (HOSPITAL_BASED_OUTPATIENT_CLINIC_OR_DEPARTMENT_OTHER): Payer: Medicare Other

## 2015-03-20 DIAGNOSIS — Z5189 Encounter for other specified aftercare: Secondary | ICD-10-CM

## 2015-03-20 DIAGNOSIS — C50211 Malignant neoplasm of upper-inner quadrant of right female breast: Secondary | ICD-10-CM

## 2015-03-20 MED ORDER — PEGFILGRASTIM INJECTION 6 MG/0.6ML ~~LOC~~
6.0000 mg | PREFILLED_SYRINGE | Freq: Once | SUBCUTANEOUS | Status: AC
  Administered 2015-03-20: 6 mg via SUBCUTANEOUS
  Filled 2015-03-20: qty 0.6

## 2015-03-26 ENCOUNTER — Other Ambulatory Visit: Payer: Self-pay

## 2015-03-26 DIAGNOSIS — C50211 Malignant neoplasm of upper-inner quadrant of right female breast: Secondary | ICD-10-CM

## 2015-03-27 ENCOUNTER — Ambulatory Visit (HOSPITAL_BASED_OUTPATIENT_CLINIC_OR_DEPARTMENT_OTHER): Payer: Medicare Other

## 2015-03-27 ENCOUNTER — Other Ambulatory Visit (HOSPITAL_BASED_OUTPATIENT_CLINIC_OR_DEPARTMENT_OTHER): Payer: Medicare Other

## 2015-03-27 ENCOUNTER — Ambulatory Visit
Admission: RE | Admit: 2015-03-27 | Discharge: 2015-03-27 | Disposition: A | Discharge status: Home / Self Care | Payer: Medicare Other | Source: Ambulatory Visit | Attending: Radiation Oncology | Admitting: Radiation Oncology

## 2015-03-27 ENCOUNTER — Ambulatory Visit (HOSPITAL_BASED_OUTPATIENT_CLINIC_OR_DEPARTMENT_OTHER): Payer: Medicare Other | Admitting: Hematology and Oncology

## 2015-03-27 ENCOUNTER — Telehealth: Payer: Self-pay | Admitting: Hematology and Oncology

## 2015-03-27 VITALS — BP 117/50 | HR 87 | Temp 98.6°F | Resp 18 | Ht 61.0 in | Wt 175.9 lb

## 2015-03-27 DIAGNOSIS — C50211 Malignant neoplasm of upper-inner quadrant of right female breast: Secondary | ICD-10-CM | POA: Diagnosis not present

## 2015-03-27 DIAGNOSIS — Z79811 Long term (current) use of aromatase inhibitors: Secondary | ICD-10-CM

## 2015-03-27 DIAGNOSIS — L599 Disorder of the skin and subcutaneous tissue related to radiation, unspecified: Secondary | ICD-10-CM | POA: Diagnosis not present

## 2015-03-27 DIAGNOSIS — Z510 Encounter for antineoplastic radiation therapy: Secondary | ICD-10-CM | POA: Insufficient documentation

## 2015-03-27 DIAGNOSIS — M7989 Other specified soft tissue disorders: Secondary | ICD-10-CM | POA: Insufficient documentation

## 2015-03-27 DIAGNOSIS — Z95828 Presence of other vascular implants and grafts: Secondary | ICD-10-CM

## 2015-03-27 LAB — COMPREHENSIVE METABOLIC PANEL (CC13)
ALT: 22 U/L (ref 0–55)
AST: 25 U/L (ref 5–34)
Albumin: 2.9 g/dL — ABNORMAL LOW (ref 3.5–5.0)
Alkaline Phosphatase: 81 U/L (ref 40–150)
Anion Gap: 11 mEq/L (ref 3–11)
BUN: 8.4 mg/dL (ref 7.0–26.0)
CO2: 22 mEq/L (ref 22–29)
CREATININE: 0.6 mg/dL (ref 0.6–1.1)
Calcium: 8.3 mg/dL — ABNORMAL LOW (ref 8.4–10.4)
Chloride: 107 mEq/L (ref 98–109)
EGFR: 87 mL/min/{1.73_m2} — ABNORMAL LOW (ref >90)
Glucose: 108 mg/dl (ref 70–140)
Potassium: 4 mEq/L (ref 3.5–5.1)
Sodium: 140 mEq/L (ref 136–145)
Total Bilirubin: 0.23 mg/dL (ref 0.20–1.20)
Total Protein: 5.4 g/dL — ABNORMAL LOW (ref 6.4–8.3)

## 2015-03-27 LAB — CBC WITH DIFFERENTIAL/PLATELET
BASO%: 1 % (ref 0.0–2.0)
Basophils Absolute: 0.2 10*3/uL — ABNORMAL HIGH (ref 0.0–0.1)
EOS%: 1.2 % (ref 0.0–7.0)
Eosinophils Absolute: 0.2 10*3/uL (ref 0.0–0.5)
HCT: 33.2 % — ABNORMAL LOW (ref 34.8–46.6)
HGB: 10.6 g/dL — ABNORMAL LOW (ref 11.6–15.9)
LYMPH#: 2.1 10*3/uL (ref 0.9–3.3)
LYMPH%: 14.1 % (ref 14.0–49.7)
MCH: 31.6 pg (ref 25.1–34.0)
MCHC: 31.9 g/dL (ref 31.5–36.0)
MCV: 99.1 fL (ref 79.5–101.0)
MONO#: 3.1 10*3/uL — AB (ref 0.1–0.9)
MONO%: 20.9 % — ABNORMAL HIGH (ref 0.0–14.0)
NEUT#: 9.2 10*3/uL — ABNORMAL HIGH (ref 1.5–6.5)
NEUT%: 62.8 % (ref 38.4–76.8)
NRBC: 1 % — AB (ref 0–0)
Platelets: 236 10*3/uL (ref 145–400)
RBC: 3.35 10*6/uL — AB (ref 3.70–5.45)
RDW: 16.9 % — ABNORMAL HIGH (ref 11.2–14.5)
WBC: 14.6 10*3/uL — ABNORMAL HIGH (ref 3.9–10.3)

## 2015-03-27 MED ORDER — HEPARIN SOD (PORK) LOCK FLUSH 100 UNIT/ML IV SOLN
500.0000 [IU] | Freq: Once | INTRAVENOUS | Status: AC
  Administered 2015-03-27: 500 [IU] via INTRAVENOUS
  Filled 2015-03-27: qty 5

## 2015-03-27 MED ORDER — SODIUM CHLORIDE 0.9 % IJ SOLN
10.0000 mL | INTRAMUSCULAR | Status: DC | PRN
  Administered 2015-03-27: 10 mL via INTRAVENOUS
  Filled 2015-03-27: qty 10

## 2015-03-27 NOTE — Progress Notes (Signed)
Radiation Oncology         [phone] ________________________________  Name: Shawna Marquez      MRN: [id number]          Date: 03/27/2015              DOB: 25-Jul-1941  Optical Surface Tracking Plan:  Since intensity modulated radiotherapy (IMRT) and 3D conformal radiation treatment methods are predicated on accurate and precise positioning for treatment, intrafraction motion monitoring is medically necessary to ensure accurate and safe treatment delivery.  The ability to quantify intrafraction motion without excessive ionizing radiation dose can only be performed with optical surface tracking. Accordingly, surface imaging offers the opportunity to obtain 3D measurements of patient position throughout IMRT and 3D treatments without excessive radiation exposure.  I am ordering optical surface tracking for this patient's upcoming course of radiotherapy. ________________________________ Signature   Reference:   Ursula Alert, J, et al. Surface imaging-based analysis of intrafraction motion for breast radiotherapy patients.Journal of [place], n. 6, nov. 2014. ISSN [id number].   Available at: <http://www.jacmp.org/index.php/jacmp/article/view/4957>.

## 2015-03-27 NOTE — Patient Instructions (Signed)

## 2015-03-27 NOTE — Assessment & Plan Note (Signed)
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, focal vascular involvement, posterior margin involvement, 1/2 sentinel node positive, T1 cN1 M0 stage II a, minimal present luminal type B, high risk, disease free survival with chemotherapy and hormonal therapy 88% versus 76% with hormonal therapy alone  Chemotherapy summary: Adjuvant chemotherapy with Taxotere and Cytoxan every 3 weeks 4 cycles started 01/15/15 completed 03/19/2015, Chemotherapy toxicities: Bone pain related to Neulasta, loss of taste, fatigue, alopecia, anemia grade 1.  Treatment plan: adjuvant radiation therapy and followed by antiestrogen therapy with anastrozole 5 years  Patient last go to [place] after the fourth cycle of chemotherapy. When she returns from [place], she will undergo radiation therapy. We will try to get her an appointment for radiation oncology first week of May.

## 2015-03-27 NOTE — Telephone Encounter (Signed)
Called and left a message with June appointment

## 2015-03-27 NOTE — Progress Notes (Signed)
Name: Shawna Marquez   MRN: [id number]  Date:  03/27/2015  DOB: 15-Mar-1941  Status:outpatient    DIAGNOSIS: Breast cancer.  CONSENT VERIFIED: yes   SET UP: Patient is setup supine   IMMOBILIZATION:  The following immobilization was used:Custom Moldable Pillow, breast board.   NARRATIVE: [name] was brought to the [facility].  Identity was confirmed.  All relevant records and images related to the planned course of therapy were reviewed.  Then, the patient was positioned in a stable reproducible clinical set-up for radiation therapy.  Wires were placed to delineate the clinical extent of breast tissue. A wire was placed on the scar as well.  CT images were obtained.  An isocenter was placed. Skin markings were placed.  The CT images were loaded into the planning software where the target and avoidance structures were contoured.  The radiation prescription was entered and confirmed. The patient was discharged in stable condition and tolerated simulation well.    TREATMENT PLANNING NOTE:  Treatment planning then occurred. I have requested : MLC's, isodose plan, basic dose calculation  I personally designed and supervised the construction of 3 medically necessary complex treatment devices for the protection of critical normal structures including the lungs and contralateral breast as well as the immobilization device which is necessary for set up certainty.   3D simulation occurred. I requested and analyzed a dose volume histogram of the heart, lungs and lumpectomy cavity.

## 2015-03-27 NOTE — Progress Notes (Signed)
Patient Care Team: Marletta Lor, MD as PCP - General Rolm Bookbinder, MD as Consulting Physician (General Surgery) Nicholas Lose, MD as Consulting Physician (Hematology and Oncology) Thea Silversmith, MD as Consulting Physician (Radiation Oncology) Holley Bouche, NP as Nurse Practitioner (Nurse Practitioner)  DIAGNOSIS: 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 breast   Staging form: Breast, AJCC 7th Edition     Clinical stage from 11/22/2014: Stage IA (T1b, N0, M0) - Unsigned     Pathologic: Stage IIA (T1c, N1a, cM0) - Signed by Seward Grater, MD on 12/26/2014       Staging comments: Staged on final lumpectomy specimen read by Dr. Saralyn Pilar.    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    Mammogram 9 mm mass at 1:00 position   11/15/2014 Initial Biopsy Right breast needle biopsy: Grade 2 invasive ductal carcinoma with DCIS with lymphovascular invasion ER/PR positive HER-2 negative Ki-67 20%   11/28/2014 Surgery right breast lumpectomy: 1.7 cm invasive ductal carcinoma with DCIS, focal vascular involvement, posterior margin involvement, 1/2 sentinel node positive, Mammaprint Luminal type, High Risk   01/15/2015 -  Chemotherapy TC X 4 adjuvant chemo    CHIEF COMPLIANT: Follow-up after chemotherapy  INTERVAL HISTORY: Shawna Marquez is a 74 year old lady with above-mentioned history of recurrent breast cancer where he received adjuvant chemotherapy and is here for one-week follow-up since completion of treatment. She complains of achiness in her muscles and joints. Feels more tired than before. Denies any nausea or vomiting. She has constipation but she has stool softeners which are relieving her symptoms.  REVIEW OF SYSTEMS:   Constitutional: Denies fevers, chills or abnormal weight loss Eyes: Denies  blurriness of vision Ears, nose, mouth, throat, and face: Denies mucositis or sore throat Respiratory: Denies cough, dyspnea or wheezes Cardiovascular: Denies palpitation, chest discomfort or lower extremity swelling Gastrointestinal:  Denies nausea, heartburn or change in bowel habits Skin: Denies abnormal skin rashes Lymphatics: Denies new lymphadenopathy or easy bruising Neurological:Denies numbness, tingling or new weaknesses Behavioral/Psych: Mood is stable, no new changes  Breast:  denies any pain or lumps or nodules in either breasts All other systems were reviewed with the patient and are negative.  I have reviewed the past medical history, past surgical history, social history and family history with the patient and they are unchanged from previous note.  ALLERGIES:  has No Known Allergies.  MEDICATIONS:  Current Outpatient Prescriptions  Medication Sig Dispense Refill  . aspirin EC 81 MG tablet Take 81 mg by mouth at bedtime.    . Calcium Citrate-Vitamin D (CALCIUM CITRATE + D3 PO) Take 1 tablet by mouth 2 (two) times daily. Calcium 500 mg, Vitamin D 800 units    . cholecalciferol (VITAMIN D) 1000 UNITS tablet Take 1,000 Units by mouth daily with lunch.     . Coenzyme Q10 (COQ10) 200 MG CAPS Take 200 mg by mouth every other day.    Marland Kitchen dexamethasone (DECADRON) 4 MG tablet Take 1 tablet (4 mg total) by mouth 2 (two) times daily. Start the day before Taxotere. Then again the day after chemo for 3 days. 30 tablet 1  . fluticasone (FLONASE) 50 MCG/ACT nasal spray INSTILL 1 SPRAY INTO EACH NOSTRIL TWICE DAILY 48 g 3  . folic acid (FOLVITE) 272 MCG tablet Take 400 mcg by mouth daily with lunch.     Marland Kitchen  hydrocortisone cream 1 % Apply 1 application topically daily as needed for itching (eczema).    Marland Kitchen levothyroxine (SYNTHROID, LEVOTHROID) 125 MCG tablet Take 1 tablet (125 mcg total) by mouth daily before breakfast. 90 tablet 3  . lidocaine-prilocaine (EMLA) cream Apply to affected area once  30 g 3  . LORazepam (ATIVAN) 0.5 MG tablet Take 1 tablet (0.5 mg total) by mouth every 6 (six) hours as needed (Nausea or vomiting). 30 tablet 0  . Multiple Vitamin (MULTIVITAMIN WITH MINERALS) TABS tablet Take 1 tablet by mouth daily with lunch.    . Omega-3 Fatty Acids (FISH OIL) 1000 MG CAPS Take 1,000 mg by mouth daily with lunch.    . ondansetron (ZOFRAN) 8 MG tablet Take 1 tablet (8 mg total) by mouth 2 (two) times daily. Start the day after chemo for 3 days. Then take as needed for nausea or vomiting. 30 tablet 1  . oxyCODONE-acetaminophen (PERCOCET) 10-325 MG per tablet Take 1 tablet by mouth every 6 (six) hours as needed for pain. 20 tablet 0  . oxyCODONE-acetaminophen (PERCOCET) 10-325 MG per tablet Take 1 tablet by mouth every 6 (six) hours as needed for pain. 20 tablet 0  . prochlorperazine (COMPAZINE) 10 MG tablet Take 1 tablet (10 mg total) by mouth every 6 (six) hours as needed (Nausea or vomiting). 30 tablet 1  . Turmeric 450 MG CAPS Take 450 mg by mouth daily with lunch.     . vitamin C (ASCORBIC ACID) 500 MG tablet Take 500 mg by mouth 2 (two) times daily.     No current facility-administered medications for this visit.    PHYSICAL EXAMINATION: ECOG PERFORMANCE STATUS: 1 - Symptomatic but completely ambulatory  Filed Vitals:   03/27/15 0923  BP: 117/50  Pulse: 87  Temp: 98.6 F (37 C)  Resp: 18   Filed Weights   03/27/15 0923  Weight: 175 lb 14.4 oz (79.788 kg)    GENERAL:alert, no distress and comfortable SKIN: skin color, texture, turgor are normal, no rashes or significant lesions EYES: normal, Conjunctiva are pink and non-injected, sclera clear OROPHARYNX:no exudate, no erythema and lips, buccal mucosa, and tongue normal  NECK: supple, thyroid normal size, non-tender, without nodularity LYMPH:  no palpable lymphadenopathy in the cervical, axillary or inguinal LUNGS: clear to auscultation and percussion with normal breathing effort HEART: regular rate &  rhythm and no murmurs and no lower extremity edema ABDOMEN:abdomen soft, non-tender and normal bowel sounds Musculoskeletal:no cyanosis of digits and no clubbing  NEURO: alert & oriented x 3 with fluent speech, no focal motor/sensory deficits  LABORATORY DATA:  I have reviewed the data as listed   Chemistry      Component Value Date/Time   NA 140 03/27/2015 0850   NA 142 12/28/2014 1533   K 4.0 03/27/2015 0850   K 4.1 12/28/2014 1533   CL 104 12/28/2014 1533   CO2 22 03/27/2015 0850   CO2 30 12/28/2014 1533   BUN 8.4 03/27/2015 0850   BUN 11 12/28/2014 1533   CREATININE 0.6 03/27/2015 0850   CREATININE 0.76 12/28/2014 1533      Component Value Date/Time   CALCIUM 8.3* 03/27/2015 0850   CALCIUM 9.3 12/28/2014 1533   ALKPHOS 81 03/27/2015 0850   ALKPHOS 67 02/24/2014 1013   AST 25 03/27/2015 0850   AST 33 02/24/2014 1013   ALT 22 03/27/2015 0850   ALT 31 02/24/2014 1013   BILITOT 0.23 03/27/2015 0850   BILITOT 0.7 02/24/2014 1013  Lab Results  Component Value Date   WBC 14.6* 03/27/2015   HGB 10.6* 03/27/2015   HCT 33.2* 03/27/2015   MCV 99.1 03/27/2015   PLT 236 03/27/2015   NEUTROABS 9.2* 03/27/2015    ASSESSMENT & PLAN:  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 breast 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, focal vascular involvement, posterior margin involvement, 1/2 sentinel node positive, T1 cN1 M0 stage II a, minimal present luminal type B, high risk, disease free survival with chemotherapy and hormonal therapy 88% versus 76% with hormonal therapy alone  Chemotherapy summary: Adjuvant chemotherapy with Taxotere and Cytoxan every 3 weeks 4 cycles started 01/15/15 completed 03/19/2015. Chemotherapy toxicities: Bone pain related to Neulasta, loss of taste, fatigue, alopecia, anemia grade 1.  Treatment plan: adjuvant radiation therapy and followed by antiestrogen therapy with anastrozole 5 years  Patient last go to [place]  after the fourth cycle of chemotherapy. When she returns from [place], she will undergo radiation therapy. We will try to get her an appointment for radiation oncology first week of May. Her blood counts today were reviewed and they did not show any evidence of neutropenia. Hence I believe she can safely go to her class reunion in [place].  No orders of the defined types were placed in this encounter.   The patient has a good understanding of the overall plan. she agrees with it. She will call with any problems that may develop before her next visit here.   Rulon Eisenmenger, MD

## 2015-03-28 ENCOUNTER — Other Ambulatory Visit: Payer: Medicare Other

## 2015-03-28 ENCOUNTER — Ambulatory Visit: Payer: Medicare Other | Admitting: Hematology and Oncology

## 2015-04-03 DIAGNOSIS — C50211 Malignant neoplasm of upper-inner quadrant of right female breast: Secondary | ICD-10-CM | POA: Diagnosis not present

## 2015-04-03 DIAGNOSIS — L599 Disorder of the skin and subcutaneous tissue related to radiation, unspecified: Secondary | ICD-10-CM | POA: Diagnosis not present

## 2015-04-03 DIAGNOSIS — M7989 Other specified soft tissue disorders: Secondary | ICD-10-CM | POA: Diagnosis not present

## 2015-04-03 DIAGNOSIS — Z510 Encounter for antineoplastic radiation therapy: Secondary | ICD-10-CM | POA: Diagnosis not present

## 2015-04-09 ENCOUNTER — Ambulatory Visit: Payer: Medicare Other | Admitting: Radiation Oncology

## 2015-04-09 ENCOUNTER — Ambulatory Visit
Admission: RE | Admit: 2015-04-09 | Discharge: 2015-04-09 | Disposition: A | Discharge status: Home / Self Care | Payer: Medicare Other | Source: Ambulatory Visit | Attending: Radiation Oncology | Admitting: Radiation Oncology

## 2015-04-09 DIAGNOSIS — C50211 Malignant neoplasm of upper-inner quadrant of right female breast: Secondary | ICD-10-CM | POA: Diagnosis not present

## 2015-04-09 DIAGNOSIS — L599 Disorder of the skin and subcutaneous tissue related to radiation, unspecified: Secondary | ICD-10-CM | POA: Diagnosis not present

## 2015-04-09 DIAGNOSIS — Z510 Encounter for antineoplastic radiation therapy: Secondary | ICD-10-CM | POA: Diagnosis not present

## 2015-04-09 DIAGNOSIS — M7989 Other specified soft tissue disorders: Secondary | ICD-10-CM | POA: Diagnosis not present

## 2015-04-10 ENCOUNTER — Ambulatory Visit (HOSPITAL_COMMUNITY)
Admission: RE | Admit: 2015-04-10 | Discharge: 2015-04-10 | Disposition: A | Discharge status: Home / Self Care | Payer: Medicare Other | Source: Ambulatory Visit | Attending: Radiation Oncology | Admitting: Radiation Oncology

## 2015-04-10 ENCOUNTER — Encounter: Payer: Self-pay | Admitting: Radiation Oncology

## 2015-04-10 ENCOUNTER — Other Ambulatory Visit: Payer: Self-pay

## 2015-04-10 ENCOUNTER — Ambulatory Visit: Payer: Medicare Other

## 2015-04-10 ENCOUNTER — Ambulatory Visit (HOSPITAL_BASED_OUTPATIENT_CLINIC_OR_DEPARTMENT_OTHER): Payer: Medicare Other | Admitting: Nurse Practitioner

## 2015-04-10 ENCOUNTER — Ambulatory Visit (HOSPITAL_BASED_OUTPATIENT_CLINIC_OR_DEPARTMENT_OTHER)
Admission: RE | Admit: 2015-04-10 | Discharge: 2015-04-10 | Disposition: A | Discharge status: Home / Self Care | Payer: Medicare Other | Source: Ambulatory Visit | Attending: Nurse Practitioner | Admitting: Nurse Practitioner

## 2015-04-10 ENCOUNTER — Ambulatory Visit
Admission: RE | Admit: 2015-04-10 | Discharge: 2015-04-10 | Disposition: A | Discharge status: Home / Self Care | Payer: Medicare Other | Source: Ambulatory Visit | Attending: Radiation Oncology | Admitting: Radiation Oncology

## 2015-04-10 VITALS — BP 115/67 | HR 91 | Resp 16 | Wt 180.5 lb

## 2015-04-10 DIAGNOSIS — Z510 Encounter for antineoplastic radiation therapy: Secondary | ICD-10-CM | POA: Diagnosis not present

## 2015-04-10 DIAGNOSIS — M7989 Other specified soft tissue disorders: Secondary | ICD-10-CM | POA: Diagnosis not present

## 2015-04-10 DIAGNOSIS — I890 Lymphedema, not elsewhere classified: Secondary | ICD-10-CM

## 2015-04-10 DIAGNOSIS — C773 Secondary and unspecified malignant neoplasm of axilla and upper limb lymph nodes: Secondary | ICD-10-CM

## 2015-04-10 DIAGNOSIS — C50211 Malignant neoplasm of upper-inner quadrant of right female breast: Secondary | ICD-10-CM

## 2015-04-10 DIAGNOSIS — L599 Disorder of the skin and subcutaneous tissue related to radiation, unspecified: Secondary | ICD-10-CM | POA: Diagnosis not present

## 2015-04-10 MED ORDER — RADIAPLEXRX EX GEL
Freq: Once | CUTANEOUS | Status: AC
  Administered 2015-04-10: 12:00:00 via TOPICAL

## 2015-04-10 MED ORDER — ALRA NON-METALLIC DEODORANT (RAD-ONC)
1.0000 "application " | Freq: Once | TOPICAL | Status: AC
  Administered 2015-04-10: 1 via TOPICAL

## 2015-04-10 NOTE — Progress Notes (Signed)
*  PRELIMINARY RESULTS* Vascular Ultrasound Left upper extremity venous duplex has been completed.  Preliminary findings: negative for DVT and superficial thrombosis.  Called results to Selena Lesser, NP   Landry Mellow, RDMS, RVT  04/10/2015, 3:27 PM

## 2015-04-10 NOTE — Progress Notes (Signed)
No skin changes noted to right/treated breast. Reports mild fatigue related to effects of chemotherapy. Weight and vitals stable.   Oriented patient to staff and routine of the clinic. Provided patient with RADIATION THERAPY AND YOU handbook then, reviewed pertinent information. Educated patient reference potential side effects and management such as fatigue, skin changes and hair loss. Provided patient with radiaplex and alra then, directed upon use. Patient verbalized understanding of all reviewed.

## 2015-04-10 NOTE — Progress Notes (Signed)
Weekly Management Note Current Dose: 1.8  Gy  Projected Dose: 61 Gy   Narrative:  The patient presents for routine under treatment assessment.  CBCT/MVCT images/Port film x-rays were reviewed.  The chart was checked. Swelling of left upper and lower extremity for past week. Difficulty accessing PAC during last chemo. RN education performed.   Physical Findings: Weight: 180 lb 8 oz (81.874 kg). Unchanged  Impression:  The patient is tolerating radiation.  Plan:  Continue treatment as planned. Contact med onc. ? PAC clot. Start radiaplex.

## 2015-04-11 ENCOUNTER — Ambulatory Visit
Admission: RE | Admit: 2015-04-11 | Discharge: 2015-04-11 | Disposition: A | Discharge status: Home / Self Care | Payer: Medicare Other | Source: Ambulatory Visit | Attending: Radiation Oncology | Admitting: Radiation Oncology

## 2015-04-11 ENCOUNTER — Ambulatory Visit: Payer: Medicare Other

## 2015-04-11 DIAGNOSIS — L599 Disorder of the skin and subcutaneous tissue related to radiation, unspecified: Secondary | ICD-10-CM | POA: Diagnosis not present

## 2015-04-11 DIAGNOSIS — C50211 Malignant neoplasm of upper-inner quadrant of right female breast: Secondary | ICD-10-CM | POA: Diagnosis not present

## 2015-04-11 DIAGNOSIS — M7989 Other specified soft tissue disorders: Secondary | ICD-10-CM | POA: Diagnosis not present

## 2015-04-11 DIAGNOSIS — Z510 Encounter for antineoplastic radiation therapy: Secondary | ICD-10-CM | POA: Diagnosis not present

## 2015-04-12 ENCOUNTER — Ambulatory Visit
Admission: RE | Admit: 2015-04-12 | Discharge: 2015-04-12 | Disposition: A | Discharge status: Home / Self Care | Payer: Medicare Other | Source: Ambulatory Visit | Attending: Radiation Oncology | Admitting: Radiation Oncology

## 2015-04-12 ENCOUNTER — Ambulatory Visit: Payer: Medicare Other

## 2015-04-12 ENCOUNTER — Other Ambulatory Visit: Payer: Self-pay | Admitting: *Deleted

## 2015-04-12 ENCOUNTER — Encounter: Payer: Self-pay | Admitting: Nurse Practitioner

## 2015-04-12 ENCOUNTER — Other Ambulatory Visit: Payer: Self-pay

## 2015-04-12 DIAGNOSIS — C50211 Malignant neoplasm of upper-inner quadrant of right female breast: Secondary | ICD-10-CM

## 2015-04-12 DIAGNOSIS — I890 Lymphedema, not elsewhere classified: Secondary | ICD-10-CM

## 2015-04-12 DIAGNOSIS — L599 Disorder of the skin and subcutaneous tissue related to radiation, unspecified: Secondary | ICD-10-CM | POA: Diagnosis not present

## 2015-04-12 DIAGNOSIS — Z853 Personal history of malignant neoplasm of breast: Secondary | ICD-10-CM

## 2015-04-12 DIAGNOSIS — Z510 Encounter for antineoplastic radiation therapy: Secondary | ICD-10-CM | POA: Diagnosis not present

## 2015-04-12 DIAGNOSIS — M7989 Other specified soft tissue disorders: Secondary | ICD-10-CM | POA: Diagnosis not present

## 2015-04-12 NOTE — Assessment & Plan Note (Signed)
Patient is complaining of some left upper extremity edema and occasional discomfort.  She also complains of occasional, mild numbness/tingling to her arm as well.  Patient denies any known injury or trauma to her arm.  Patient states that she has a Compression sleeve at home but only wears it occasionally.  On exam.-Patient is noted to have some moderate lymphedema to her entire left upper extremity.  There is no evidence of erythema, warmth, tenderness, or red streaks to the arm.  Patient was observed with full range of motion to her arm.  Doppler ultrasound of the left upper extremity was negative for DVT.  Most likely, patient's edema is actually lymphedema and subsequent discomfort.  Will order a New lymphedema clinic referral for review of lab, edema massage techniques.  Also advised patient to start wearing her compression sleeve to her left arm on a More regular basis.  Have also placed a General surgery referral to Dr. Donne Hazel for Port-A-Cath removal.

## 2015-04-12 NOTE — Progress Notes (Signed)
SYMPTOM MANAGEMENT CLINIC   HPI: Shawna Marquez 74 y.o. female diagnosed with recurrent breast cancer.  Patient has a History of left breast cancer; and underwent a Mastectomy in 1997.  Patient was diagnosed with right breast cancer in December 2015.  She underwent a Right breast lumpectomy on 11/28/2014.  She completed Taxotere/Cytoxan chemotherapy on 03/19/2015.  She is currently undergoing radiation therapy.   Patient is complaining of some left upper extremity edema and occasional discomfort.  She also complains of occasional, mild numbness/tingling to her arm as well.  Patient denies any known injury or trauma to her arm.  Patient states that she has a Compression sleeve at home but only wears it occasionally.  HPI  ROS  Past Medical History  Diagnosis Date  . ALLERGIC RHINITIS 10/12/2007  . BREAST CANCER, HX OF 10/12/2007    right breast diagnosed - 11/2014  . DIVERTICULITIS, HX OF 10/12/2007  . HYPOTHYROIDISM, PRIMARY 10/12/2007  . 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 Wears glasses   . Family history of breast cancer   . Complication of anesthesia     bp will drop,hard to wake up  . Sleep apnea     USES cpap NIGHTLY  . OSTEOARTHRITIS 09/09/2007    back     Past Surgical History  Procedure Laterality Date  . Colovaginal fistula  2007    colon resection  . Abdominal hysterectomy  1977  . Hernia repair  2007    ventral  . Colon surgery  2006    part colectomy  . Mastectomy  1997    lt mast-axillary node dissection  . Tonsillectomy    . Colonoscopy  2007  . Wisdom tooth extraction    . Eye surgery      both cataracts  . Radioactive seed guided mastectomy with axillary sentinel lymph node biopsy Right 11/28/2014    Procedure: RADIOACTIVE SEED GUIDED RIGHT LUMPECTOMY WITH RIGHT AXILLARY SENTINEL LYMPH NODE BIOPSY;  Surgeon: Rolm Bookbinder, MD;  Location: [facility];  Service: General;  Laterality: Right;  . Re-excision of breast  cancer,superior margins Right 01/01/2015    Procedure: RIGHT BREAST MARGIN EXCISION;  Surgeon: Rolm Bookbinder, MD;  Location: [facility];  Service: General;  Laterality: Right;  . Portacath placement Left 01/01/2015    Procedure: INSERTION PORT-A-CATH;  Surgeon: Rolm Bookbinder, MD;  Location: [facility];  Service: General;  Laterality: Left;    has Hypothyroidism; Allergic rhinitis; OSTEOARTHRITIS; BREAST CANCER, HX OF; DIVERTICULITIS, HX OF; 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 breast; Family history of breast cancer; and Lymphedema of arm on her problem list.    has No Known Allergies.    Medication List       This list is accurate as of: 04/10/15 11:59 PM.  Always use your most recent med list.               aspirin EC 81 MG tablet  Take 81 mg by mouth at bedtime.     CALCIUM CITRATE + D3 PO  Take 1 tablet by mouth 2 (two) times daily. Calcium 500 mg, Vitamin D 800 units     cholecalciferol 1000 UNITS tablet  Commonly known as:  VITAMIN D  Take 1,000 Units by mouth daily with lunch.     CoQ10 200 MG Caps  Take 200 mg by mouth every other day.     dexamethasone 4 MG tablet  Commonly known as:  DECADRON  Take 1  tablet (4 mg total) by mouth 2 (two) times daily. Start the day before Taxotere. Then again the day after chemo for 3 days.     Fish Oil 1000 MG Caps  Take 1,000 mg by mouth daily with lunch.     fluticasone 50 MCG/ACT nasal spray  Commonly known as:  FLONASE  INSTILL 1 SPRAY INTO EACH NOSTRIL TWICE DAILY     folic acid 735 MCG tablet  Commonly known as:  FOLVITE  Take 400 mcg by mouth daily with lunch.     hydrocortisone cream 1 %  Apply 1 application topically daily as needed for itching (eczema).     levothyroxine 125 MCG tablet  Commonly known as:  SYNTHROID, LEVOTHROID  Take 1 tablet (125 mcg total) by mouth daily before breakfast.     lidocaine-prilocaine cream  Commonly known as:  EMLA  Apply to affected area once     LORazepam 0.5  MG tablet  Commonly known as:  ATIVAN  Take 1 tablet (0.5 mg total) by mouth every 6 (six) hours as needed (Nausea or vomiting).     multivitamin with minerals Tabs tablet  Take 1 tablet by mouth daily with lunch.     non-metallic deodorant Misc  Commonly known as:  ALRA  Apply 1 application topically daily as needed.     ondansetron 8 MG tablet  Commonly known as:  ZOFRAN  Take 1 tablet (8 mg total) by mouth 2 (two) times daily. Start the day after chemo for 3 days. Then take as needed for nausea or vomiting.     oxyCODONE-acetaminophen 10-325 MG per tablet  Commonly known as:  PERCOCET  Take 1 tablet by mouth every 6 (six) hours as needed for pain.     oxyCODONE-acetaminophen 10-325 MG per tablet  Commonly known as:  PERCOCET  Take 1 tablet by mouth every 6 (six) hours as needed for pain.     prochlorperazine 10 MG tablet  Commonly known as:  COMPAZINE  Take 1 tablet (10 mg total) by mouth every 6 (six) hours as needed (Nausea or vomiting).     RADIAPLEX EX  Apply topically.     Turmeric 450 MG Caps  Take 450 mg by mouth daily with lunch.     vitamin C 500 MG tablet  Commonly known as:  ASCORBIC ACID  Take 500 mg by mouth 2 (two) times daily.         PHYSICAL EXAMINATION  Oncology Vitals 04/10/2015 03/27/2015 03/20/2015 03/19/2015 03/19/2015 02/27/2015 02/26/2015  Height - 155 cm - 155 cm - - 155 cm  Weight 81.874 kg 79.788 kg - 80.23 kg 80.241 kg - 79.243 kg  Weight (lbs) 180 lbs 8 oz 175 lbs 14 oz - 176 lbs 14 oz 176 lbs 14 oz - 174 lbs 11 oz  BMI (kg/m2) - 33.24 kg/m2 - 33.42 kg/m2 - - 33.01 kg/m2  Temp - 98.6 98.3 97.6 97.6 98.4 98.4  Pulse 91 87 85 95 95 87 79  Resp 16 18 - 18 - - 18  SpO2 - 99 - 98 98 - -  BSA (m2) - 1.85 m2 - 1.86 m2 - - 1.85 m2   BP Readings from Last 3 Encounters:  03/27/15 117/50  03/20/15 109/48  03/19/15 110/59    Physical Exam  Constitutional: She is oriented to person, place, and time and well-developed, well-nourished, and in no  distress.  HENT:  Head: Normocephalic and atraumatic.  Eyes: Conjunctivae and EOM are normal. Pupils are  equal, round, and reactive to light. Right eye exhibits no discharge. Left eye exhibits no discharge. No scleral icterus.  Neck: Normal range of motion.  Pulmonary/Chest: Effort normal. No respiratory distress.  Musculoskeletal: Normal range of motion. She exhibits edema. She exhibits no tenderness.  Patient with moderate edema to entire left upper extremity.  There is no erythema, warmth, tenderness, or red streaks to arm.  Patient observed with full range of motion.  Neurological: She is alert and oriented to person, place, and time. Gait normal.  Skin: Skin is warm and dry. No rash noted. No erythema. No pallor.  Psychiatric: Affect normal.  Nursing note and vitals reviewed.   LABORATORY DATA:. No visits with results within 3 Day(s) from this visit. Latest known visit with results is:  Appointment on 03/27/2015  Component Date Value Ref Range Status  . WBC 03/27/2015 14.6* 3.9 - 10.3 10e3/uL Final  . NEUT# 03/27/2015 9.2* 1.5 - 6.5 10e3/uL Final  . HGB 03/27/2015 10.6* 11.6 - 15.9 g/dL Final  . HCT 03/27/2015 33.2* 34.8 - 46.6 % Final  . Platelets 03/27/2015 236  145 - 400 10e3/uL Final  . MCV 03/27/2015 99.1  79.5 - 101.0 fL Final  . MCH 03/27/2015 31.6  25.1 - 34.0 pg Final  . MCHC 03/27/2015 31.9  31.5 - 36.0 g/dL Final  . RBC 03/27/2015 3.35* 3.70 - 5.45 10e6/uL Final  . RDW 03/27/2015 16.9* 11.2 - 14.5 % Final  . lymph# 03/27/2015 2.1  0.9 - 3.3 10e3/uL Final  . MONO# 03/27/2015 3.1* 0.1 - 0.9 10e3/uL Final  . Eosinophils Absolute 03/27/2015 0.2  0.0 - 0.5 10e3/uL Final  . Basophils Absolute 03/27/2015 0.2* 0.0 - 0.1 10e3/uL Final  . NEUT% 03/27/2015 62.8  38.4 - 76.8 % Final  . LYMPH% 03/27/2015 14.1  14.0 - 49.7 % Final  . MONO% 03/27/2015 20.9* 0.0 - 14.0 % Final  . EOS% 03/27/2015 1.2  0.0 - 7.0 % Final  . BASO% 03/27/2015 1.0  0.0 - 2.0 % Final  . nRBC  03/27/2015 1* 0 - 0 % Final  . Sodium 03/27/2015 140  136 - 145 mEq/L Final  . Potassium 03/27/2015 4.0  3.5 - 5.1 mEq/L Final  . Chloride 03/27/2015 107  98 - 109 mEq/L Final  . CO2 03/27/2015 22  22 - 29 mEq/L Final  . Glucose 03/27/2015 108  70 - 140 mg/dl Final  . BUN 03/27/2015 8.4  7.0 - 26.0 mg/dL Final  . Creatinine 03/27/2015 0.6  0.6 - 1.1 mg/dL Final  . Total Bilirubin 03/27/2015 0.23  0.20 - 1.20 mg/dL Final  . Alkaline Phosphatase 03/27/2015 81  40 - 150 U/L Final  . AST 03/27/2015 25  5 - 34 U/L Final  . ALT 03/27/2015 22  0 - 55 U/L Final  . Total Protein 03/27/2015 5.4* 6.4 - 8.3 g/dL Final  . Albumin 03/27/2015 2.9* 3.5 - 5.0 g/dL Final  . Calcium 03/27/2015 8.3* 8.4 - 10.4 mg/dL Final  . Anion Gap 03/27/2015 11  3 - 11 mEq/L Final  . EGFR 03/27/2015 87* >90 ml/min/1.73 m2 Final   eGFR is calculated using the CKD-EPI Creatinine Equation (2009)   Doppler US: *PRELIMINARY RESULTS* Vascular Ultrasound Left upper extremity venous duplex has been completed. Preliminary findings: negative for DVT and superficial thrombosis.   RADIOGRAPHIC STUDIES: No results found.  ASSESSMENT/PLAN:    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 breast Patient completed Taxotere/Cytoxan chemotherapy on 03/19/2015.  She is currently undergoing  radiation therapy on a Daily basis.  Her final radiation treatment is scheduled for 05/25/2015.  Patient is scheduled to return for labs and a Follow-up visit here at the [facility] on 05/31/2015.  Of note-have confirmed with Dr. Lindi Adie the patient may have her Port-A-Cath removed.  Have placed a Referral for Dr. Donne Hazel.  General surgeon to remove Port-A-Cath.  Have also called Dr. Cristal Generous office in an attempt to schedule Port-A-Cath removal.  Awaiting phone call back to arrange.   Lymphedema of arm Patient is complaining of some left upper extremity edema and occasional discomfort.  She also complains of occasional, mild  numbness/tingling to her arm as well.  Patient denies any known injury or trauma to her arm.  Patient states that she has a Compression sleeve at home but only wears it occasionally.  On exam.-Patient is noted to have some moderate lymphedema to her entire left upper extremity.  There is no evidence of erythema, warmth, tenderness, or red streaks to the arm.  Patient was observed with full range of motion to her arm.  Doppler ultrasound of the left upper extremity was negative for DVT.  Most likely, patient's edema is actually lymphedema and subsequent discomfort.  Will order a New lymphedema clinic referral for review of lab, edema massage techniques.  Also advised patient to start wearing her compression sleeve to her left arm on a More regular basis.  Have also placed a General surgery referral to Dr. Donne Hazel for Port-A-Cath removal.   Patient stated understanding of all instructions; and was in agreement with this plan of care. The patient knows to call the clinic with any problems, questions or concerns.   Review/collaboration with Dr. Lindi Adie regarding all aspects of patient's visit today.   Total time spent with patient was 40 minutes;  with greater than 75 percent of that time spent in face to face counseling regarding patient's symptoms,  and coordination of care and follow up.  Disclaimer: This note was dictated with voice recognition software. Similar sounding words can inadvertently be transcribed and may not be corrected upon review.   Drue Second, NP 04/12/2015

## 2015-04-12 NOTE — Assessment & Plan Note (Signed)
Patient completed Taxotere/Cytoxan chemotherapy on 03/19/2015.  She is currently undergoing radiation therapy on a Daily basis.  Her final radiation treatment is scheduled for 05/25/2015.  Patient is scheduled to return for labs and a Follow-up visit here at the [facility] on 05/31/2015.  Of note-have confirmed with Dr. Lindi Adie the patient may have her Port-A-Cath removed.  Have placed a Referral for Dr. Donne Hazel.  General surgeon to remove Port-A-Cath.  Have also called Dr. Cristal Generous office in an attempt to schedule Port-A-Cath removal.  Awaiting phone call back to arrange.

## 2015-04-13 ENCOUNTER — Ambulatory Visit
Admission: RE | Admit: 2015-04-13 | Discharge: 2015-04-13 | Disposition: A | Discharge status: Home / Self Care | Payer: Medicare Other | Source: Ambulatory Visit | Attending: Radiation Oncology | Admitting: Radiation Oncology

## 2015-04-13 ENCOUNTER — Ambulatory Visit: Payer: Medicare Other

## 2015-04-13 DIAGNOSIS — M7989 Other specified soft tissue disorders: Secondary | ICD-10-CM | POA: Diagnosis not present

## 2015-04-13 DIAGNOSIS — L599 Disorder of the skin and subcutaneous tissue related to radiation, unspecified: Secondary | ICD-10-CM | POA: Diagnosis not present

## 2015-04-13 DIAGNOSIS — Z510 Encounter for antineoplastic radiation therapy: Secondary | ICD-10-CM | POA: Diagnosis not present

## 2015-04-13 DIAGNOSIS — C50211 Malignant neoplasm of upper-inner quadrant of right female breast: Secondary | ICD-10-CM | POA: Diagnosis not present

## 2015-04-16 ENCOUNTER — Ambulatory Visit: Payer: Medicare Other

## 2015-04-16 ENCOUNTER — Ambulatory Visit
Admission: RE | Admit: 2015-04-16 | Discharge: 2015-04-16 | Disposition: A | Discharge status: Home / Self Care | Payer: Medicare Other | Source: Ambulatory Visit | Attending: Radiation Oncology | Admitting: Radiation Oncology

## 2015-04-16 DIAGNOSIS — M7989 Other specified soft tissue disorders: Secondary | ICD-10-CM | POA: Diagnosis not present

## 2015-04-16 DIAGNOSIS — L599 Disorder of the skin and subcutaneous tissue related to radiation, unspecified: Secondary | ICD-10-CM | POA: Diagnosis not present

## 2015-04-16 DIAGNOSIS — C50211 Malignant neoplasm of upper-inner quadrant of right female breast: Secondary | ICD-10-CM | POA: Diagnosis not present

## 2015-04-16 DIAGNOSIS — Z510 Encounter for antineoplastic radiation therapy: Secondary | ICD-10-CM | POA: Diagnosis not present

## 2015-04-17 ENCOUNTER — Ambulatory Visit
Admission: RE | Admit: 2015-04-17 | Discharge: 2015-04-17 | Disposition: A | Discharge status: Home / Self Care | Payer: Medicare Other | Source: Ambulatory Visit | Attending: Radiation Oncology | Admitting: Radiation Oncology

## 2015-04-17 ENCOUNTER — Ambulatory Visit: Payer: Medicare Other

## 2015-04-17 VITALS — BP 127/57 | HR 85 | Temp 98.8°F | Wt 181.2 lb

## 2015-04-17 DIAGNOSIS — L599 Disorder of the skin and subcutaneous tissue related to radiation, unspecified: Secondary | ICD-10-CM | POA: Diagnosis not present

## 2015-04-17 DIAGNOSIS — Z510 Encounter for antineoplastic radiation therapy: Secondary | ICD-10-CM | POA: Diagnosis not present

## 2015-04-17 DIAGNOSIS — C50211 Malignant neoplasm of upper-inner quadrant of right female breast: Secondary | ICD-10-CM

## 2015-04-17 DIAGNOSIS — M7989 Other specified soft tissue disorders: Secondary | ICD-10-CM | POA: Diagnosis not present

## 2015-04-17 NOTE — Progress Notes (Signed)
Weekly assessment of radiation to right breast..Completed 6 of 33 treatments.Denies pain.No skin changes.Reinforced when to apply radiaplex gel.

## 2015-04-17 NOTE — Progress Notes (Signed)
Weekly Management Note Current Dose: 10.8  Gy  Projected Dose: 61 Gy   Narrative:  The patient presents for routine under treatment assessment.  CBCT/MVCT images/Port film x-rays were reviewed.  The chart was checked. She is continuing investigations for there LEFT sided arm swelling. She has an appt with PT tomorrow. The arm swelling is slightly better.   Weight: 181 lb 3.2 oz (82.192 kg). Unchanged right breast. Left arm in loose wrap.   Impression:  The patient is tolerating radiation.  Plan:  Continue treatment as planned. Continue follow up with PT. Continue radiaplex.

## 2015-04-18 ENCOUNTER — Ambulatory Visit: Payer: Medicare Other

## 2015-04-18 ENCOUNTER — Ambulatory Visit
Admission: RE | Admit: 2015-04-18 | Discharge: 2015-04-18 | Disposition: A | Discharge status: Home / Self Care | Payer: Medicare Other | Source: Ambulatory Visit | Attending: Radiation Oncology | Admitting: Radiation Oncology

## 2015-04-18 DIAGNOSIS — Z510 Encounter for antineoplastic radiation therapy: Secondary | ICD-10-CM | POA: Diagnosis not present

## 2015-04-18 DIAGNOSIS — M7989 Other specified soft tissue disorders: Secondary | ICD-10-CM | POA: Diagnosis not present

## 2015-04-18 DIAGNOSIS — C50211 Malignant neoplasm of upper-inner quadrant of right female breast: Secondary | ICD-10-CM | POA: Diagnosis not present

## 2015-04-18 DIAGNOSIS — L599 Disorder of the skin and subcutaneous tissue related to radiation, unspecified: Secondary | ICD-10-CM | POA: Diagnosis not present

## 2015-04-19 ENCOUNTER — Ambulatory Visit: Payer: Medicare Other

## 2015-04-19 ENCOUNTER — Ambulatory Visit
Admission: RE | Admit: 2015-04-19 | Discharge: 2015-04-19 | Disposition: A | Discharge status: Home / Self Care | Payer: Medicare Other | Source: Ambulatory Visit | Attending: Radiation Oncology | Admitting: Radiation Oncology

## 2015-04-19 ENCOUNTER — Ambulatory Visit: Payer: Medicare Other | Attending: General Surgery | Admitting: Physical Therapy

## 2015-04-19 DIAGNOSIS — M549 Dorsalgia, unspecified: Secondary | ICD-10-CM | POA: Insufficient documentation

## 2015-04-19 DIAGNOSIS — L599 Disorder of the skin and subcutaneous tissue related to radiation, unspecified: Secondary | ICD-10-CM | POA: Diagnosis not present

## 2015-04-19 DIAGNOSIS — C50211 Malignant neoplasm of upper-inner quadrant of right female breast: Secondary | ICD-10-CM | POA: Diagnosis not present

## 2015-04-19 DIAGNOSIS — M7989 Other specified soft tissue disorders: Secondary | ICD-10-CM | POA: Diagnosis not present

## 2015-04-19 DIAGNOSIS — I890 Lymphedema, not elsewhere classified: Secondary | ICD-10-CM

## 2015-04-19 DIAGNOSIS — G8929 Other chronic pain: Secondary | ICD-10-CM | POA: Diagnosis not present

## 2015-04-19 DIAGNOSIS — M25611 Stiffness of right shoulder, not elsewhere classified: Secondary | ICD-10-CM | POA: Insufficient documentation

## 2015-04-19 DIAGNOSIS — E8989 Other postprocedural endocrine and metabolic complications and disorders: Secondary | ICD-10-CM

## 2015-04-19 DIAGNOSIS — Z510 Encounter for antineoplastic radiation therapy: Secondary | ICD-10-CM | POA: Diagnosis not present

## 2015-04-19 NOTE — Patient Instructions (Signed)
Neck Side-Bending   Begin with chin level, head centered over spine. Slowly lower one ear toward shoulder keeping shoulder down. Hold __3__ seconds. Slowly return to starting position. Repeat to other side.  Repeat __5-10__ times to each side. Do 2-3 times a day. Also stretch by looking over right shoulder, then left.  Scapular Retraction (Standing)   With arms at sides, pinch shoulder blades together.  Hold 3 seconds. Repeat __5-10__ times per set.  Do __2-3__ sessions per day.   Active ROM Flexion    Raise arm to point to ceiling, keeping elbow straight. Hold __3__ seconds. Repeat _5-10___ times. Do _2-3___ sessions per day. Activity: Use this motion to reach for items on overhead shelves.     Flexion (Active)   Palm up, rest elbow on other hand. Bend as far as possible. Hold __3__ seconds. Repeat __5-10__ times. Do _2-3___ sessions per day. Can use other hand to bend elbow further if needed where bandage may limit end motion.  Copyright  VHI. All rights reserved.    Extension (Active With Finger Extension)   Llift hand with fingers straight, then bend wrist down.  Hold _3___ seconds each way. Repeat __5-10__ times. Do _2-3___ sessions per day.    AROM: Forearm Pronation / Supination   With right arm in handshake position, slowly rotate palm down. Relax. Then rotate palm up. Repeat __5-10__ times per set. Do __2-3__ sessions per day.  Copyright  VHI. All rights reserved.    AROM: Finger Flexion / Extension   Actively bend fingers of right hand. Start with knuckles furthest from palm, and slowly make a fist. Hold __3__ seconds. Relax. Then straighten fingers as far as possible. Repeat _5-10___ times per set.  Do __2-3__ sessions per day. Also spread fingers as far as able, then bring them back together.  Copyright  VHI. All rights reserved.   If any pain/tingling symptoms occur, try all of these exercises first to promote circulation. If symptoms do not  ease off, then remove bandages and bring all wrappings back to next appointment.    

## 2015-04-20 ENCOUNTER — Ambulatory Visit: Payer: Medicare Other

## 2015-04-20 ENCOUNTER — Ambulatory Visit
Admission: RE | Admit: 2015-04-20 | Discharge: 2015-04-20 | Disposition: A | Discharge status: Home / Self Care | Payer: Medicare Other | Source: Ambulatory Visit | Attending: Radiation Oncology | Admitting: Radiation Oncology

## 2015-04-20 DIAGNOSIS — M7989 Other specified soft tissue disorders: Secondary | ICD-10-CM | POA: Diagnosis not present

## 2015-04-20 DIAGNOSIS — C50211 Malignant neoplasm of upper-inner quadrant of right female breast: Secondary | ICD-10-CM | POA: Diagnosis not present

## 2015-04-20 DIAGNOSIS — L599 Disorder of the skin and subcutaneous tissue related to radiation, unspecified: Secondary | ICD-10-CM | POA: Diagnosis not present

## 2015-04-20 DIAGNOSIS — Z510 Encounter for antineoplastic radiation therapy: Secondary | ICD-10-CM | POA: Diagnosis not present

## 2015-04-20 NOTE — Therapy (Signed)
[place], [place], [address] Phone: [phone]   Fax:  [phone]  Physical Therapy Evaluation  Patient Details  Name: Shawna Marquez MRN: [id number] Date of Birth: 02-08-1941 Referring Provider:  Chauncey Cruel, MD  Encounter Date: 04/19/2015      PT End of Session - 04/20/15 0733    Visit Number 1   Number of Visits 13   Date for PT Re-Evaluation 05/21/15   PT Start Time 1300   PT Stop Time 1345   PT Time Calculation (min) 45 min   Activity Tolerance Patient tolerated treatment well   Behavior During Therapy [facility] for tasks assessed/performed      Past Medical History  Diagnosis Date  . ALLERGIC RHINITIS 10/12/2007  . BREAST CANCER, HX OF 10/12/2007    right breast diagnosed - 11/2014  . DIVERTICULITIS, HX OF 10/12/2007  . HYPOTHYROIDISM, PRIMARY 10/12/2007  . 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 Wears glasses   . Family history of breast cancer   . Complication of anesthesia     bp will drop,hard to wake up  . Sleep apnea     USES cpap NIGHTLY  . OSTEOARTHRITIS 09/09/2007    back     Past Surgical History  Procedure Laterality Date  . Colovaginal fistula  2007    colon resection  . Abdominal hysterectomy  1977  . Hernia repair  2007    ventral  . Colon surgery  2006    part colectomy  . Mastectomy  1997    lt mast-axillary node dissection  . Tonsillectomy    . Colonoscopy  2007  . Wisdom tooth extraction    . Eye surgery      both cataracts  . Radioactive seed guided mastectomy with axillary sentinel lymph node biopsy Right 11/28/2014    Procedure: RADIOACTIVE SEED GUIDED RIGHT LUMPECTOMY WITH RIGHT AXILLARY SENTINEL LYMPH NODE BIOPSY;  Surgeon: Rolm Bookbinder, MD;  Location: [facility];  Service: General;  Laterality: Right;  . Re-excision of breast cancer,superior margins Right 01/01/2015    Procedure: RIGHT BREAST MARGIN EXCISION;   Surgeon: Rolm Bookbinder, MD;  Location: [facility];  Service: General;  Laterality: Right;  . Portacath placement Left 01/01/2015    Procedure: INSERTION PORT-A-CATH;  Surgeon: Rolm Bookbinder, MD;  Location: [facility];  Service: General;  Laterality: Left;    There were no vitals filed for this visit.  Visit Diagnosis:  Lymphedema of upper extremity following lymphadenectomy      Subjective Assessment - 04/20/15 0722    Subjective pt states she has new onset of swelling in her left arm; this is the mastectomy side from many years ago that she has never has any problems with. The only thing she can think of that might be a factor is that a nurse had a problem injecting into her port which in in her left chest. the swelling started a few days later. She aslo has swelling in her left ankle and right ankle that started just this morning. She has a doppler of her left arm that showed no blood clot    Pertinent History Right breast cancer diagnosed December 2015 with lumpectomy 11/28/14 with margins not clear; 01/01/15 had second lumpectomy and portacath placement.  Had chemo and is now undergoing radiation til the end of June .  Will also take Arimidex.  Two lymph nodes removed, one positive.  h/o left breast cancer with mastectomy 1997  with 16 lymph nodes  removed.                                                            Patient Stated Goals to get rid of swelling   Currently in Pain? No/denies            [facility] PT Assessment - 04/20/15 0001    Assessment   Medical Diagnosis lymphedema of left arm   Onset Date 04/02/15   Precautions   Precautions Other (comment)  cancer precautions; h/o left ALND with mild swelling   Restrictions   Weight Bearing Restrictions No   Balance Screen   Has the patient fallen in the past 6 months No   Has the patient had a decrease in activity level because of a fear of falling?  No   Is the patient reluctant to leave their home because of a fear of falling?  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   Skin Integrity healed chest in cisions   Sensation   Light Touch Appears Intact   Coordination   Gross Motor Movements are Fluid and Coordinated Yes           LYMPHEDEMA/ONCOLOGY QUESTIONNAIRE - 04/20/15 0729    Type   Cancer Type breast   Treatment   Active Radiation Treatment Yes   What other symptoms do you have   Are you Having Heaviness or Tightness Yes   Are you having Pain No   Do you have infections No   [place]mer Sign Yes   Left Upper Extremity Lymphedema   10 cm Proximal to Olecranon Process 33.5 cm   Olecranon Process 29 cm   10 cm Proximal to Ulnar Styloid Process 26 cm   Just Proximal to Ulnar Styloid Process 18 cm   Across Hand at [employer] 19.9 cm   At [place] of 2nd Digit 6.5 cm           Quick Dash - 04/20/15 0001    Open a tight or new jar Mild difficulty   Do heavy household chores (wash walls, wash floors) Unable   Carry a shopping bag or briefcase Severe difficulty   Wash your back Unable   Use a knife to cut food Moderate difficulty   Recreational activities in which you take some force or impact through your arm, shoulder, or hand (golf, hammering, tennis) Unable   During the past week, to what extent has your arm, shoulder or hand problem interfered with your normal social activities with family, friends, neighbors, or groups? Quite a bit   During the past week, to what extent has your arm, shoulder or hand problem limited your work or other regular daily  activities Quite a bit   Arm, shoulder, or hand pain. Moderate   Tingling (pins and needles) in your arm, shoulder, or hand Mild  Difficulty Sleeping Moderate difficulty   DASH Score 65.91 %             [facility] Adult PT Treatment/Exercise - 04/20/15 0001    Shoulder Exercises: Supine   Other Supine Exercises upper extremity remedial lymphedma exercise in bandages   Manual Therapy   Manual Lymphatic Drainage (MLD) in supine, right short neck (avoided left chest), superficial and deep abdominals, left inguinal nodes. left axillo-inguinal anastamosis, left shouldr upper arn, forearm and hand    Compression Bandaging Thick stockinetee(small) elastomull to all 5 fingets, artiflex, 6, 10, 12 short stretch bandage from hand to axilla                        [place] - 04/20/15 0740    CC Long Term Goal  #1   Title Patient will have a circumferential reduction of    1.5       cm at   10   cm above the         ulnar styloid    Baseline 26   Time 4   Period Weeks   Status New   CC Long Term Goal  #2   Title Patient will have a circumferential reduction of      2   cm at          10 cm above the             olecranon process   Baseline 29   Time 4   Period Weeks   Status New   CC Long Term Goal  #3   Title Patient will be know how to obtain and use compression garments for maintenance phase of treatment   Time 4   Period Weeks   Status New   CC Long Term Goal  #4   Title Patient will decrease the DASH score to < 50   to demonstrate increased functional use of upper extremity   Baseline 65.91   Time 4   Period Weeks   Status New            Plan - 04/20/15 0734    Clinical Impression Statement pt known to [place] from previous admission, now with new onset of lymphedema in arm after old mastectomy (1997) with axillary node dissection after many years of no problems. She is knowledgable about the condition and anxious to have reduction so treatment of  short session of manual lymph drainage and compression wrapping administered today.. Anticipate she will have quick reduction  with compression and that she will need to get a graded compression garment for this arm.    Rehab Potential Excellent   Clinical Impairments Affecting Rehab Potential Concurrent radiation to right upper quadrant.  New onset of bilateral ankle edema due to ???   PT Frequency 3x / week   PT Duration 4 weeks   PT Treatment/Interventions Therapeutic exercise;Compression bandaging;DME Instruction;Patient/family education;Manual lymph drainage   PT Next Visit Plan Remeasure to assess effectiveness of intital treatment. Manual lymph draiange and rewrapping, review exericse    PT Home Exercise Plan UE remedical lymphatic flow range of motion   Consulted and Agree with Plan of Care Patient          G-Codes - 05/13/15 0745    Functional Assessment Tool Used quick dash   Functional Limitation Carrying, moving and handling objects   Carrying, Moving and Handling Objects Current Status ([id number]) At least 60 percent but less  than 80 percent impaired, limited or restricted   Carrying, Moving and Handling Objects Goal Status [phone]) At least 20 percent but less than 40 percent impaired, limited or restricted       Problem List Patient Active Problem List   Diagnosis Date Noted  . Lymphedema of arm 04/12/2015  . Family history of breast cancer   . 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 breast 11/17/2014  . Hypothyroidism 10/12/2007  . Allergic rhinitis 10/12/2007  . BREAST CANCER, HX OF 10/12/2007  . DIVERTICULITIS, HX OF 10/12/2007  . OSTEOARTHRITIS 09/09/2007   Donato Heinz. Owens Shark, PT  04/20/2015, 7:47 AM  [place], [place], [address] Phone: [phone]   Fax:  [phone]

## 2015-04-23 ENCOUNTER — Encounter: Payer: Medicare Other | Admitting: Nurse Practitioner

## 2015-04-23 ENCOUNTER — Ambulatory Visit: Payer: Medicare Other

## 2015-04-23 ENCOUNTER — Telehealth: Payer: Self-pay | Admitting: *Deleted

## 2015-04-23 ENCOUNTER — Ambulatory Visit
Admission: RE | Admit: 2015-04-23 | Discharge: 2015-04-23 | Disposition: A | Discharge status: Home / Self Care | Payer: Medicare Other | Source: Ambulatory Visit | Attending: Radiation Oncology | Admitting: Radiation Oncology

## 2015-04-23 ENCOUNTER — Ambulatory Visit: Payer: Medicare Other | Admitting: Physical Therapy

## 2015-04-23 DIAGNOSIS — L599 Disorder of the skin and subcutaneous tissue related to radiation, unspecified: Secondary | ICD-10-CM | POA: Diagnosis not present

## 2015-04-23 DIAGNOSIS — E8989 Other postprocedural endocrine and metabolic complications and disorders: Secondary | ICD-10-CM

## 2015-04-23 DIAGNOSIS — M549 Dorsalgia, unspecified: Secondary | ICD-10-CM | POA: Diagnosis not present

## 2015-04-23 DIAGNOSIS — Z510 Encounter for antineoplastic radiation therapy: Secondary | ICD-10-CM | POA: Diagnosis not present

## 2015-04-23 DIAGNOSIS — M25611 Stiffness of right shoulder, not elsewhere classified: Secondary | ICD-10-CM | POA: Diagnosis not present

## 2015-04-23 DIAGNOSIS — G8929 Other chronic pain: Secondary | ICD-10-CM | POA: Diagnosis not present

## 2015-04-23 DIAGNOSIS — C50211 Malignant neoplasm of upper-inner quadrant of right female breast: Secondary | ICD-10-CM | POA: Diagnosis not present

## 2015-04-23 DIAGNOSIS — M7989 Other specified soft tissue disorders: Secondary | ICD-10-CM | POA: Diagnosis not present

## 2015-04-23 DIAGNOSIS — I890 Lymphedema, not elsewhere classified: Principal | ICD-10-CM

## 2015-04-23 NOTE — Telephone Encounter (Signed)
This RN assessed pt's legs per concern from pt and lymphedema clinic for bilateral lower leg swelling.  Per assessment this RN noted bilateral 1+ edema with some pitting.  Legs at present were bilateral in size.  Per palpation, no focal areas of warmth or hardness felt, nor legs are equal in color bilaterally.  Pt denies any focal areas of tenderness or feelings or increased warmth.  Per inquiry - Shawna Marquez states swelling is less in the am and increases during the day and especially when she keeps her feet down.  Shawna Marquez stated concern due to swelling due to left arm that is swollen and under the care of PT.  This RN informed pt swelling in lower extremities most likely secondary to post taxotere therapy.  Discussed above swelling usually transient as well as interventions including elevating ankles higher then heart several times a day.  No further needs at this time.  Shawna Marquez understands to inform this office if swelling worsens or pain or redness occurs in legs.

## 2015-04-23 NOTE — Therapy (Signed)
[place], [place], [address] Phone: [phone]   Fax:  [phone]  Physical Therapy Treatment  Patient Details  Name: Shawna Marquez MRN: [id number] Date of Birth: 06-09-1941 Referring Provider:  Chauncey Cruel, MD  Encounter Date: 04/23/2015      PT End of Session - 04/23/15 1211    Visit Number 2   Number of Visits 13   Date for PT Re-Evaluation 05/21/15   PT Start Time 1130   PT Stop Time 1211   PT Time Calculation (min) 41 min      Past Medical History  Diagnosis Date  . ALLERGIC RHINITIS 10/12/2007  . BREAST CANCER, HX OF 10/12/2007    right breast diagnosed - 11/2014  . DIVERTICULITIS, HX OF 10/12/2007  . HYPOTHYROIDISM, PRIMARY 10/12/2007  . 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 Wears glasses   . Family history of breast cancer   . Complication of anesthesia     bp will drop,hard to wake up  . Sleep apnea     USES cpap NIGHTLY  . OSTEOARTHRITIS 09/09/2007    back     Past Surgical History  Procedure Laterality Date  . Colovaginal fistula  2007    colon resection  . Abdominal hysterectomy  1977  . Hernia repair  2007    ventral  . Colon surgery  2006    part colectomy  . Mastectomy  1997    lt mast-axillary node dissection  . Tonsillectomy    . Colonoscopy  2007  . Wisdom tooth extraction    . Eye surgery      both cataracts  . Radioactive seed guided mastectomy with axillary sentinel lymph node biopsy Right 11/28/2014    Procedure: RADIOACTIVE SEED GUIDED RIGHT LUMPECTOMY WITH RIGHT AXILLARY SENTINEL LYMPH NODE BIOPSY;  Surgeon: Rolm Bookbinder, MD;  Location: [facility];  Service: General;  Laterality: Right;  . Re-excision of breast cancer,superior margins Right 01/01/2015    Procedure: RIGHT BREAST MARGIN EXCISION;  Surgeon: Rolm Bookbinder, MD;  Location: [facility];  Service: General;  Laterality: Right;  . Portacath placement  Left 01/01/2015    Procedure: INSERTION PORT-A-CATH;  Surgeon: Rolm Bookbinder, MD;  Location: [facility];  Service: General;  Laterality: Left;    There were no vitals filed for this visit.  Visit Diagnosis:  Lymphedema of upper extremity following lymphadenectomy      Subjective Assessment - 04/23/15 1212    Subjective "the machine is down" pt unable to get radiation treatment and will have to go back later today. She states she kept her arm and legs elevated all weekend She has increased swelling in both ankles today and she doesn't know why.  She will call her primary doctor this afternoon   Patient is accompained by: Family member   Pertinent History Right breast cancer diagnosed December 2015 with lumpectomy 11/28/14 with margins not clear; 01/01/15 had second lumpectomy and portacath placement.  Had chemo and is now undergoing radiation til the end of June .  Will also take Arimidex.  Two lymph nodes removed, one positive.  h/o left breast cancer with mastectomy 1997 with 16 lymph nodes  removed.  Patient Stated Goals to get rid of swelling   Currently in Pain? No/denies               LYMPHEDEMA/ONCOLOGY QUESTIONNAIRE - 04/23/15 1133    Left Upper Extremity Lymphedema   10 cm Proximal to Olecranon Process 33.2 cm   Olecranon Process 28 cm   10 cm Proximal to Ulnar Styloid Process 25.4 cm   Just Proximal to Ulnar Styloid Process 17 cm   Across Hand at [employer] 18.5 cm   At [place] of 2nd Digit 6.3 cm                  [facility] Adult PT Treatment/Exercise - 04/23/15 0001    Manual Therapy   Manual Lymphatic Drainage (MLD) in supine, right short neck (avoided left chest), superficial and deep abdominals, left inguinal nodes. left axillo-inguinal anastamosis, left shouldr upper arn, forearm and hand    Compression Bandaging biotone applied, used thick tg soft size mediim elastomull to all 5 fingets, artiflex, 6,  10,10,  12 short stretch bandage from hand to axilla                        [place] - 04/23/15 1222    CC Long Term Goal  #1   Title Patient will have a circumferential reduction of    1.5       cm at   10   cm above the         ulnar styloid    Status On-going   CC Long Term Goal  #2   Title Patient will have a circumferential reduction of      2   cm at          10 cm above the             olecranon process   Status On-going   CC Long Term Goal  #3   Title Patient will be know how to obtain and use compression garments for maintenance phase of treatment   Status On-going   CC Long Term Goal  #4   Title Patient will decrease the DASH score to < 50   to demonstrate increased functional use of upper extremity   Status On-going            Plan - 04/23/15 1220    Clinical Impression Statement pt had good reduction in hand and wrist and also at forearm and elbow.  temporary red mark at upper arm so medium tg soft used today and added 10 cm short stretch bandage for added compression   Clinical Impairments Affecting Rehab Potential Concurrent radiation to right upper quadrant.  New onset of bilateral ankle edema due to ???   PT Next Visit Plan Reassess arm  Manual lymph draiange and rewrapping, review exericse         Problem List Patient Active Problem List   Diagnosis Date Noted  . Lymphedema of arm 04/12/2015  . Family history of breast cancer   . 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 breast 11/17/2014  . Hypothyroidism 10/12/2007  . Allergic rhinitis 10/12/2007  . BREAST CANCER, HX OF 10/12/2007  . DIVERTICULITIS, HX OF 10/12/2007  . OSTEOARTHRITIS 09/09/2007   Donato Heinz. Owens Shark, PT    04/23/2015, 12:23 PM  [place] [address], [place], [address] Phone: [phone]   Fax:  [phone]

## 2015-04-24 ENCOUNTER — Ambulatory Visit: Payer: Medicare Other

## 2015-04-24 ENCOUNTER — Ambulatory Visit
Admission: RE | Admit: 2015-04-24 | Discharge: 2015-04-24 | Disposition: A | Discharge status: Home / Self Care | Payer: Medicare Other | Source: Ambulatory Visit | Attending: Radiation Oncology | Admitting: Radiation Oncology

## 2015-04-24 VITALS — BP 110/57 | HR 84 | Temp 98.6°F | Wt 178.5 lb

## 2015-04-24 DIAGNOSIS — M7989 Other specified soft tissue disorders: Secondary | ICD-10-CM | POA: Diagnosis not present

## 2015-04-24 DIAGNOSIS — C50211 Malignant neoplasm of upper-inner quadrant of right female breast: Secondary | ICD-10-CM | POA: Diagnosis not present

## 2015-04-24 DIAGNOSIS — Z510 Encounter for antineoplastic radiation therapy: Secondary | ICD-10-CM | POA: Diagnosis not present

## 2015-04-24 DIAGNOSIS — L599 Disorder of the skin and subcutaneous tissue related to radiation, unspecified: Secondary | ICD-10-CM | POA: Diagnosis not present

## 2015-04-24 NOTE — Progress Notes (Signed)
Weekly assessment of radiation to right breast.Completed 11 of 33 treatments.Denies pain.No skin changes.Continue application of radiaplex twice daily.

## 2015-04-24 NOTE — Progress Notes (Signed)
Weekly Management Note Current Dose:  19.8 Gy  Projected Dose: 60.4 Gy   Narrative:  The patient presents for routine under treatment assessment.  CBCT/MVCT images/Port film x-rays were reviewed.  The chart was checked. L arm apin last night (removed wrappings. Seeing PT today. PAC scheduled to be removed soon.   Physical Findings: Weight: 178 lb 8 oz (80.967 kg). Unchanged  Impression:  The patient is tolerating radiation.  Plan:  Continue treatment as planned. Continue radiaplex. OK to have PAC removed during treatment.

## 2015-04-25 ENCOUNTER — Ambulatory Visit: Payer: Medicare Other | Admitting: Physical Therapy

## 2015-04-25 ENCOUNTER — Telehealth: Payer: Self-pay

## 2015-04-25 ENCOUNTER — Ambulatory Visit
Admission: RE | Admit: 2015-04-25 | Discharge: 2015-04-25 | Disposition: A | Discharge status: Home / Self Care | Payer: Medicare Other | Source: Ambulatory Visit | Attending: Radiation Oncology | Admitting: Radiation Oncology

## 2015-04-25 ENCOUNTER — Ambulatory Visit: Payer: Medicare Other

## 2015-04-25 DIAGNOSIS — M7989 Other specified soft tissue disorders: Secondary | ICD-10-CM | POA: Diagnosis not present

## 2015-04-25 DIAGNOSIS — L599 Disorder of the skin and subcutaneous tissue related to radiation, unspecified: Secondary | ICD-10-CM | POA: Diagnosis not present

## 2015-04-25 DIAGNOSIS — I890 Lymphedema, not elsewhere classified: Principal | ICD-10-CM

## 2015-04-25 DIAGNOSIS — E8989 Other postprocedural endocrine and metabolic complications and disorders: Secondary | ICD-10-CM

## 2015-04-25 DIAGNOSIS — C50211 Malignant neoplasm of upper-inner quadrant of right female breast: Secondary | ICD-10-CM | POA: Diagnosis not present

## 2015-04-25 DIAGNOSIS — M25611 Stiffness of right shoulder, not elsewhere classified: Secondary | ICD-10-CM | POA: Diagnosis not present

## 2015-04-25 DIAGNOSIS — G8929 Other chronic pain: Secondary | ICD-10-CM | POA: Diagnosis not present

## 2015-04-25 DIAGNOSIS — M549 Dorsalgia, unspecified: Secondary | ICD-10-CM | POA: Diagnosis not present

## 2015-04-25 DIAGNOSIS — Z510 Encounter for antineoplastic radiation therapy: Secondary | ICD-10-CM | POA: Diagnosis not present

## 2015-04-25 NOTE — Telephone Encounter (Signed)
Order for mastectomy bras sent to [facility].  Sent to scan.

## 2015-04-25 NOTE — Therapy (Signed)
[place], [place], [address] Phone: [phone]   Fax:  [phone]  Physical Therapy Treatment  Patient Details  Name: Shawna Marquez MRN: [id number] Date of Birth: 18-Aug-1941 Referring Provider:  Chauncey Cruel, MD  Encounter Date: 04/25/2015      PT End of Session - 04/25/15 1219    Visit Number 3   Number of Visits 13   Date for PT Re-Evaluation 05/21/15   PT Start Time 0930   PT Stop Time 1018   PT Time Calculation (min) 48 min      Past Medical History  Diagnosis Date  . ALLERGIC RHINITIS 10/12/2007  . BREAST CANCER, HX OF 10/12/2007    right breast diagnosed - 11/2014  . DIVERTICULITIS, HX OF 10/12/2007  . HYPOTHYROIDISM, PRIMARY 10/12/2007  . 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 Wears glasses   . Family history of breast cancer   . Complication of anesthesia     bp will drop,hard to wake up  . Sleep apnea     USES cpap NIGHTLY  . OSTEOARTHRITIS 09/09/2007    back     Past Surgical History  Procedure Laterality Date  . Colovaginal fistula  2007    colon resection  . Abdominal hysterectomy  1977  . Hernia repair  2007    ventral  . Colon surgery  2006    part colectomy  . Mastectomy  1997    lt mast-axillary node dissection  . Tonsillectomy    . Colonoscopy  2007  . Wisdom tooth extraction    . Eye surgery      both cataracts  . Radioactive seed guided mastectomy with axillary sentinel lymph node biopsy Right 11/28/2014    Procedure: RADIOACTIVE SEED GUIDED RIGHT LUMPECTOMY WITH RIGHT AXILLARY SENTINEL LYMPH NODE BIOPSY;  Surgeon: Rolm Bookbinder, MD;  Location: [facility];  Service: General;  Laterality: Right;  . Re-excision of breast cancer,superior margins Right 01/01/2015    Procedure: RIGHT BREAST MARGIN EXCISION;  Surgeon: Rolm Bookbinder, MD;  Location: [facility];  Service: General;  Laterality: Right;  . Portacath placement  Left 01/01/2015    Procedure: INSERTION PORT-A-CATH;  Surgeon: Rolm Bookbinder, MD;  Location: [facility];  Service: General;  Laterality: Left;    There were no vitals filed for this visit.  Visit Diagnosis:  Lymphedema of upper extremity following lymphadenectomy      Subjective Assessment - 04/25/15 0929    Subjective "I took the bandages off at 8:15 this morning and had a fabulous shower."    Currently in Pain? No/denies               LYMPHEDEMA/ONCOLOGY QUESTIONNAIRE - 04/25/15 0931    Left Upper Extremity Lymphedema   10 cm Proximal to Olecranon Process 34 cm   Olecranon Process 27.2 cm   10 cm Proximal to Ulnar Styloid Process 25.2 cm   Just Proximal to Ulnar Styloid Process 18.9 cm   Across Hand at [employer] 19 cm   At [place] of 2nd Digit 6.7 cm                  [facility] Adult PT Treatment/Exercise - 04/25/15 0001    Manual Therapy   Manual Therapy Edema management   Edema Management circumference measurements taken   Manual Lymphatic Drainage (MLD) In supine, short neck, superficial and deep abdomen, left groin and axillo-inguinal anastomosis, and left UE from fingers to  shoulder.   Compression Bandaging Lotion applied; bandaging with TG soft, Elastomull on all five fingers (with tape around fingers), Artiflex, and four short stretch bandages from hand to shoulder                        [place] - 04/25/15 1221    CC Long Term Goal  #1   Status On-going   CC Long Term Goal  #2   Status On-going   CC Long Term Goal  #3   Status On-going   CC Long Term Goal  #4   Status On-going            Plan - 04/25/15 1219    Clinical Impression Statement Patient's measurements were improved only at mid-arm today; others slightly up.  Patient does feel good about decreased swelling so far.   Pt will benefit from skilled therapeutic intervention in order to improve on the following deficits Increased edema   Rehab Potential  Excellent   PT Frequency 3x / week   PT Duration 4 weeks   PT Treatment/Interventions Manual lymph drainage;Compression bandaging   PT Next Visit Plan complete decongestive therapy   Consulted and Agree with Plan of Care Patient        Problem List Patient Active Problem List   Diagnosis Date Noted  . Lymphedema of arm 04/12/2015  . Family history of breast cancer   . 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 breast 11/17/2014  . Hypothyroidism 10/12/2007  . Allergic rhinitis 10/12/2007  . BREAST CANCER, HX OF 10/12/2007  . DIVERTICULITIS, HX OF 10/12/2007  . OSTEOARTHRITIS 09/09/2007    SALISBURY,DONNA 04/25/2015, 12:22 PM  [place], [place], [address] Phone: [phone]   Fax:  M[place], PT 04/25/2015 12:23 PM

## 2015-04-25 NOTE — Patient Instructions (Signed)
Patient to do left UE ROM and to remove bandages in case of unrelieved discomfort.

## 2015-04-26 ENCOUNTER — Ambulatory Visit: Payer: Medicare Other

## 2015-04-26 ENCOUNTER — Ambulatory Visit
Admission: RE | Admit: 2015-04-26 | Discharge: 2015-04-26 | Disposition: A | Discharge status: Home / Self Care | Payer: Medicare Other | Source: Ambulatory Visit | Attending: Radiation Oncology | Admitting: Radiation Oncology

## 2015-04-26 DIAGNOSIS — Z510 Encounter for antineoplastic radiation therapy: Secondary | ICD-10-CM | POA: Diagnosis not present

## 2015-04-26 DIAGNOSIS — L599 Disorder of the skin and subcutaneous tissue related to radiation, unspecified: Secondary | ICD-10-CM | POA: Diagnosis not present

## 2015-04-26 DIAGNOSIS — C50211 Malignant neoplasm of upper-inner quadrant of right female breast: Secondary | ICD-10-CM | POA: Diagnosis not present

## 2015-04-26 DIAGNOSIS — M7989 Other specified soft tissue disorders: Secondary | ICD-10-CM | POA: Diagnosis not present

## 2015-04-27 ENCOUNTER — Ambulatory Visit
Admission: RE | Admit: 2015-04-27 | Discharge: 2015-04-27 | Disposition: A | Discharge status: Home / Self Care | Payer: Medicare Other | Source: Ambulatory Visit | Attending: Radiation Oncology | Admitting: Radiation Oncology

## 2015-04-27 ENCOUNTER — Ambulatory Visit: Payer: Medicare Other

## 2015-04-27 DIAGNOSIS — M7989 Other specified soft tissue disorders: Secondary | ICD-10-CM | POA: Diagnosis not present

## 2015-04-27 DIAGNOSIS — C50211 Malignant neoplasm of upper-inner quadrant of right female breast: Secondary | ICD-10-CM | POA: Diagnosis not present

## 2015-04-27 DIAGNOSIS — Z510 Encounter for antineoplastic radiation therapy: Secondary | ICD-10-CM | POA: Diagnosis not present

## 2015-04-27 DIAGNOSIS — L599 Disorder of the skin and subcutaneous tissue related to radiation, unspecified: Secondary | ICD-10-CM | POA: Diagnosis not present

## 2015-04-30 ENCOUNTER — Ambulatory Visit: Payer: Medicare Other

## 2015-04-30 ENCOUNTER — Ambulatory Visit
Admission: RE | Admit: 2015-04-30 | Discharge: 2015-04-30 | Disposition: A | Discharge status: Home / Self Care | Payer: Medicare Other | Source: Ambulatory Visit | Attending: Radiation Oncology | Admitting: Radiation Oncology

## 2015-04-30 DIAGNOSIS — M7989 Other specified soft tissue disorders: Secondary | ICD-10-CM | POA: Diagnosis not present

## 2015-04-30 DIAGNOSIS — C50211 Malignant neoplasm of upper-inner quadrant of right female breast: Secondary | ICD-10-CM | POA: Diagnosis not present

## 2015-04-30 DIAGNOSIS — L599 Disorder of the skin and subcutaneous tissue related to radiation, unspecified: Secondary | ICD-10-CM | POA: Diagnosis not present

## 2015-04-30 DIAGNOSIS — Z510 Encounter for antineoplastic radiation therapy: Secondary | ICD-10-CM | POA: Diagnosis not present

## 2015-05-01 ENCOUNTER — Ambulatory Visit: Payer: Medicare Other

## 2015-05-01 ENCOUNTER — Ambulatory Visit
Admission: RE | Admit: 2015-05-01 | Discharge: 2015-05-01 | Disposition: A | Discharge status: Home / Self Care | Payer: Medicare Other | Source: Ambulatory Visit | Attending: Radiation Oncology | Admitting: Radiation Oncology

## 2015-05-01 VITALS — BP 133/74 | HR 79 | Temp 98.1°F | Wt 175.9 lb

## 2015-05-01 DIAGNOSIS — M549 Dorsalgia, unspecified: Secondary | ICD-10-CM

## 2015-05-01 DIAGNOSIS — G8929 Other chronic pain: Secondary | ICD-10-CM | POA: Diagnosis not present

## 2015-05-01 DIAGNOSIS — M7989 Other specified soft tissue disorders: Secondary | ICD-10-CM | POA: Diagnosis not present

## 2015-05-01 DIAGNOSIS — I890 Lymphedema, not elsewhere classified: Principal | ICD-10-CM

## 2015-05-01 DIAGNOSIS — C50211 Malignant neoplasm of upper-inner quadrant of right female breast: Secondary | ICD-10-CM

## 2015-05-01 DIAGNOSIS — E8989 Other postprocedural endocrine and metabolic complications and disorders: Secondary | ICD-10-CM

## 2015-05-01 DIAGNOSIS — M25611 Stiffness of right shoulder, not elsewhere classified: Secondary | ICD-10-CM | POA: Diagnosis not present

## 2015-05-01 DIAGNOSIS — L599 Disorder of the skin and subcutaneous tissue related to radiation, unspecified: Secondary | ICD-10-CM | POA: Diagnosis not present

## 2015-05-01 DIAGNOSIS — Z510 Encounter for antineoplastic radiation therapy: Secondary | ICD-10-CM | POA: Diagnosis not present

## 2015-05-01 NOTE — Progress Notes (Signed)
Weekly assessment of radiation to right breast.Completed 16 of 33 fractions.Denies pain or fatigue.Skin pink.Part of dressing for left arm lymphedema removed from shoulder.Has some itching dry skin.Told to moisturize.Continue application of radiaplex twice daily.

## 2015-05-01 NOTE — Therapy (Signed)
Shawna Marquez, [place], [address] Phone: [phone]   Fax:  [phone]  Physical Therapy Treatment  Patient Details  Name: Shawna Marquez MRN: [id number] Date of Birth: 1940-12-20 Referring Provider:  Marletta Lor, MD  Encounter Date: 05/01/2015      PT End of Session - 05/01/15 1014    Visit Number 4   Number of Visits 13   Date for PT Re-Evaluation 05/21/15   PT Start Time 0930   PT Stop Time 1012   PT Time Calculation (min) 42 min      Past Medical History  Diagnosis Date  . ALLERGIC RHINITIS 10/12/2007  . BREAST CANCER, HX OF 10/12/2007    right breast diagnosed - 11/2014  . DIVERTICULITIS, HX OF 10/12/2007  . HYPOTHYROIDISM, PRIMARY 10/12/2007  . 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 Wears glasses   . Family history of breast cancer   . Complication of anesthesia     bp will drop,hard to wake up  . Sleep apnea     USES cpap NIGHTLY  . OSTEOARTHRITIS 09/09/2007    back     Past Surgical History  Procedure Laterality Date  . Colovaginal fistula  2007    colon resection  . Abdominal hysterectomy  1977  . Hernia repair  2007    ventral  . Colon surgery  2006    part colectomy  . Mastectomy  1997    lt mast-axillary node dissection  . Tonsillectomy    . Colonoscopy  2007  . Wisdom tooth extraction    . Eye surgery      both cataracts  . Radioactive seed guided mastectomy with axillary sentinel lymph node biopsy Right 11/28/2014    Procedure: RADIOACTIVE SEED GUIDED RIGHT LUMPECTOMY WITH RIGHT AXILLARY SENTINEL LYMPH NODE BIOPSY;  Surgeon: Rolm Bookbinder, MD;  Location: [facility];  Service: General;  Laterality: Right;  . Re-excision of breast cancer,superior margins Right 01/01/2015    Procedure: RIGHT BREAST MARGIN EXCISION;  Surgeon: Rolm Bookbinder, MD;  Location: [facility];  Service: General;  Laterality: Right;  . Portacath placement  Left 01/01/2015    Procedure: INSERTION PORT-A-CATH;  Surgeon: Rolm Bookbinder, MD;  Location: [facility];  Service: General;  Laterality: Left;    There were no vitals filed for this visit.  Visit Diagnosis:  Lymphedema of upper extremity following lymphadenectomy  Back pain, chronic  Stiffness of joint, shoulder region, right      Subjective Assessment - 05/01/15 0929    Subjective I left the bandages on for the week so they've really slipped down alot. I have to leave a few minutes early for my radiation appt.   Currently in Pain? No/denies               LYMPHEDEMA/ONCOLOGY QUESTIONNAIRE - 05/01/15 0937    Left Upper Extremity Lymphedema   10 cm Proximal to Olecranon Process 31 cm   Olecranon Process 26.9 cm   10 cm Proximal to Ulnar Styloid Process 25 cm   Just Proximal to Ulnar Styloid Process 16.2 cm   Across Hand at [employer] 18.4 cm   At [place] of 2nd Digit 6.3 cm                  [facility] Adult PT Treatment/Exercise - 05/01/15 0001    Manual Therapy   Edema Management circumference measurements taken   Manual Lymphatic Drainage (MLD) In supine, short  neck, superficial and deep abdomen, left groin and axillo-inguinal anastomosis, and left UE from fingers to shoulder.   Compression Bandaging Lotion applied; bandaging with TG soft, Elastomull on all five fingers, Artiflex, and 3 short stretch bandages from hand to shoulder                        [place] - 05/01/15 1016    CC Long Term Goal  #1   Title Patient will have a circumferential reduction of    1.5       cm at   10   cm above the         ulnar styloid    Status On-going   CC Long Term Goal  #2   Title Patient will have a circumferential reduction of      2   cm at          10 cm above the             olecranon process  3 cm reduction 05/01/15   Status Achieved   CC Long Term Goal  #3   Title Patient will be know how to obtain and use compression garments for  maintenance phase of treatment   Status On-going   CC Long Term Goal  #4   Title Patient will decrease the DASH score to < 50   to demonstrate increased functional use of upper extremity   Status On-going            Plan - 05/01/15 0936    Clinical Impression Statement Pts circumference measurements all showed good reductions today except at forearm and elbow where bandage had settled. Instructed pt for future reference when she has a large gap between appointments (>4 days) to remove bandages and just wear TG soft sleeve to avoid uneven compression from bandage sliding down arm., she agreed. Pts skin was extremely dry today,probably just from being wrapped for 7 days, but no redness or areas of irritation.   Pt will benefit from skilled therapeutic intervention in order to improve on the following deficits Increased edema   Rehab Potential Excellent   Clinical Impairments Affecting Rehab Potential Concurrent radiation to right upper quadrant.  Shawna onset of bilateral ankle edema due to ???   PT Frequency 3x / week   PT Duration 4 weeks   PT Treatment/Interventions Manual lymph drainage;Compression bandaging   PT Next Visit Plan complete decongestive therapy   Consulted and Agree with Plan of Care Patient        Problem List Patient Active Problem List   Diagnosis Date Noted  . Lymphedema of arm 04/12/2015  . Family history of breast cancer   . 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 breast 11/17/2014  . Hypothyroidism 10/12/2007  . Allergic rhinitis 10/12/2007  . BREAST CANCER, HX OF 10/12/2007  . DIVERTICULITIS, HX OF 10/12/2007  . OSTEOARTHRITIS 09/09/2007    Shawna Marquez, PTA 05/01/2015, 10:18 AM  Shawna Marquez, [place], [address] Phone: [phone]   Fax:  [phone]

## 2015-05-01 NOTE — Progress Notes (Signed)
Weekly Management Note Current Dose: 28.8  Gy  Projected Dose: 61 Gy   Narrative:  The patient presents for routine under treatment assessment.  CBCT/MVCT images/Port film x-rays were reviewed.  The chart was checked. Doing ok. Swelling continues of left arm. PT wrapping and getting better.   Physical Findings: Weight: 175 lb 14.4 oz (79.788 kg). Unchanged skin.   Impression:  The patient is tolerating radiation.  Plan:  Continue treatment as planned. Continue radiaplex.

## 2015-05-02 ENCOUNTER — Ambulatory Visit: Payer: Medicare Other

## 2015-05-02 ENCOUNTER — Ambulatory Visit
Admission: RE | Admit: 2015-05-02 | Discharge: 2015-05-02 | Disposition: A | Discharge status: Home / Self Care | Payer: Medicare Other | Source: Ambulatory Visit | Attending: Radiation Oncology | Admitting: Radiation Oncology

## 2015-05-02 DIAGNOSIS — Z510 Encounter for antineoplastic radiation therapy: Secondary | ICD-10-CM | POA: Diagnosis not present

## 2015-05-02 DIAGNOSIS — M7989 Other specified soft tissue disorders: Secondary | ICD-10-CM | POA: Diagnosis not present

## 2015-05-02 DIAGNOSIS — L599 Disorder of the skin and subcutaneous tissue related to radiation, unspecified: Secondary | ICD-10-CM | POA: Diagnosis not present

## 2015-05-02 DIAGNOSIS — C50211 Malignant neoplasm of upper-inner quadrant of right female breast: Secondary | ICD-10-CM | POA: Diagnosis not present

## 2015-05-03 ENCOUNTER — Ambulatory Visit
Admission: RE | Admit: 2015-05-03 | Discharge: 2015-05-03 | Disposition: A | Discharge status: Home / Self Care | Payer: Medicare Other | Source: Ambulatory Visit | Attending: Radiation Oncology | Admitting: Radiation Oncology

## 2015-05-03 ENCOUNTER — Ambulatory Visit: Payer: Medicare Other

## 2015-05-03 ENCOUNTER — Ambulatory Visit: Payer: Medicare Other | Admitting: Physical Therapy

## 2015-05-03 ENCOUNTER — Encounter: Payer: Self-pay | Admitting: Physical Therapy

## 2015-05-03 DIAGNOSIS — M7989 Other specified soft tissue disorders: Secondary | ICD-10-CM | POA: Diagnosis not present

## 2015-05-03 DIAGNOSIS — E8989 Other postprocedural endocrine and metabolic complications and disorders: Secondary | ICD-10-CM

## 2015-05-03 DIAGNOSIS — L599 Disorder of the skin and subcutaneous tissue related to radiation, unspecified: Secondary | ICD-10-CM | POA: Diagnosis not present

## 2015-05-03 DIAGNOSIS — G8929 Other chronic pain: Secondary | ICD-10-CM | POA: Diagnosis not present

## 2015-05-03 DIAGNOSIS — Z510 Encounter for antineoplastic radiation therapy: Secondary | ICD-10-CM | POA: Diagnosis not present

## 2015-05-03 DIAGNOSIS — I890 Lymphedema, not elsewhere classified: Principal | ICD-10-CM

## 2015-05-03 DIAGNOSIS — M549 Dorsalgia, unspecified: Secondary | ICD-10-CM | POA: Diagnosis not present

## 2015-05-03 DIAGNOSIS — C50211 Malignant neoplasm of upper-inner quadrant of right female breast: Secondary | ICD-10-CM | POA: Diagnosis not present

## 2015-05-03 DIAGNOSIS — M25611 Stiffness of right shoulder, not elsewhere classified: Secondary | ICD-10-CM | POA: Diagnosis not present

## 2015-05-03 NOTE — Patient Instructions (Signed)
Patient name: ___Paula Shabazz       Patient DOB: __09-10-2042__        Date: _5/26/16___________        Therapist:  Leone Payor, [place]  -  LEFT ARM AFFECTED   1. Hug yourself:  cross arms and do circles at collarbones near neck 5x's  2. Right armpit - small upward circles (active lymph nodes)  3. Left groin area (at panty-line) - small upward circles 5x's  4. Redirect fluid from left chest working toward right armpit 3-4x's  5. Redirect fluid from left armpit working toward left groin 3-4x's  6. Pump technique up left upper arm - start at elbow and work toward shoulder 5x's (stay on outside of arm)  7. Redirect fluid from inner to outer left upper arm.  Repeat step #6  8. Repeat #4 and #5 (2-3x's)  9. Pump technique up left forearm - start at wrist and work toward elbow 5x's; then pump up the entire left arm - from the wrist toward the shoulder 5x's (be sure to do both sides of forearm)  10. Repeat #4 and #5 (2-3x's)  11. Can do circles with flat fingers up back on hand or finger tips between hand bones/knuckles; directing fluid out of spaces and up hand  12. Repeat #9 and #10  13. End with #2 and #3   [facility] [address] [place] [address] [phone]

## 2015-05-03 NOTE — Therapy (Signed)
[place], [place], [address] Phone: [phone]   Fax:  [phone]  Physical Therapy Treatment  Patient Details  Name: Shawna Marquez MRN: [id number] Date of Birth: 1941/10/06 Referring Provider:  Chauncey Cruel, MD  Encounter Date: 05/03/2015      PT End of Session - 05/03/15 1210    Visit Number 5   Number of Visits 13   Date for PT Re-Evaluation 05/21/15   PT Start Time 0928   PT Stop Time 1016   PT Time Calculation (min) 48 min   Activity Tolerance Patient tolerated treatment well   Behavior During Therapy [facility] for tasks assessed/performed      Past Medical History  Diagnosis Date  . ALLERGIC RHINITIS 10/12/2007  . BREAST CANCER, HX OF 10/12/2007    right breast diagnosed - 11/2014  . DIVERTICULITIS, HX OF 10/12/2007  . HYPOTHYROIDISM, PRIMARY 10/12/2007  . 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 Wears glasses   . Family history of breast cancer   . Complication of anesthesia     bp will drop,hard to wake up  . Sleep apnea     USES cpap NIGHTLY  . OSTEOARTHRITIS 09/09/2007    back     Past Surgical History  Procedure Laterality Date  . Colovaginal fistula  2007    colon resection  . Abdominal hysterectomy  1977  . Hernia repair  2007    ventral  . Colon surgery  2006    part colectomy  . Mastectomy  1997    lt mast-axillary node dissection  . Tonsillectomy    . Colonoscopy  2007  . Wisdom tooth extraction    . Eye surgery      both cataracts  . Radioactive seed guided mastectomy with axillary sentinel lymph node biopsy Right 11/28/2014    Procedure: RADIOACTIVE SEED GUIDED RIGHT LUMPECTOMY WITH RIGHT AXILLARY SENTINEL LYMPH NODE BIOPSY;  Surgeon: Rolm Bookbinder, MD;  Location: [facility];  Service: General;  Laterality: Right;  . Re-excision of breast cancer,superior margins Right 01/01/2015    Procedure: RIGHT BREAST MARGIN EXCISION;   Surgeon: Rolm Bookbinder, MD;  Location: [facility];  Service: General;  Laterality: Right;  . Portacath placement Left 01/01/2015    Procedure: INSERTION PORT-A-CATH;  Surgeon: Rolm Bookbinder, MD;  Location: [facility];  Service: General;  Laterality: Left;    There were no vitals filed for this visit.  Visit Diagnosis:  Lymphedema of upper extremity following lymphadenectomy      Subjective Assessment - 05/03/15 0936    Subjective Doing fine; feel like I'm making progress.  I watched the video and I feel like I can do the manual lymph drainage.   Pertinent History Right breast cancer diagnosed December 2015 with lumpectomy 11/28/14 with margins not clear; 01/01/15 had second lumpectomy and portacath placement.  Had chemo and is now undergoing radiation til the end of June .  Will also take Arimidex.  Two lymph nodes removed, one positive.  h/o left breast cancer with mastectomy 1997 with 16 lymph nodes  removed.                                                            Patient Stated Goals to get rid of  swelling   Currently in Pain? No/denies                         [facility] Adult PT Treatment/Exercise - 05/03/15 0001    Manual Therapy   Manual Therapy Manual Lymphatic Drainage (MLD);Compression Bandaging   Manual Lymphatic Drainage (MLD) In supine, short neck, superficial and deep abdomen, left groin and axillo-inguinal anastomosis, and left UE from fingers to shoulder.   Compression Bandaging Lotion applied; bandaging with TG soft, Elastomull on all five fingers, Artiflex, and 3 short stretch bandages from hand to shoulder                PT Education - 05/03/15 1210    Education provided Yes   Education Details Instruction in self manual lymph drainage   Person(s) Educated Patient   Methods Explanation;Demonstration;Verbal cues;Handout   Comprehension Verbalized understanding                [facility] Goals - 05/01/15 1016    CC Long Term Goal  #1    Title Patient will have a circumferential reduction of    1.5       cm at   10   cm above the         ulnar styloid    Status On-going   CC Long Term Goal  #2   Title Patient will have a circumferential reduction of      2   cm at          10 cm above the             olecranon process  3 cm reduction 05/01/15   Status Achieved   CC Long Term Goal  #3   Title Patient will be know how to obtain and use compression garments for maintenance phase of treatment   Status On-going   CC Long Term Goal  #4   Title Patient will decrease the DASH score to < 50   to demonstrate increased functional use of upper extremity   Status On-going            Plan - 05/03/15 1210    Clinical Impression Statement Pt continues to make good progress with decongestive therapy.  She has a problem area on her medial distal upper arm where tissue appears to have increased edema and is somewhat fibrotic.  She will hopefully respond well to the peach foam used today.  She will benefit from continued PT to continue reducing her swelling.   Pt will benefit from skilled therapeutic intervention in order to improve on the following deficits Increased edema   Rehab Potential Excellent   Clinical Impairments Affecting Rehab Potential Concurrent radiation to right upper quadrant.     PT Frequency 3x / week   PT Duration 4 weeks   PT Treatment/Interventions Manual lymph drainage;Compression bandaging   PT Next Visit Plan complete decongestive therapy   Consulted and Agree with Plan of Care Patient        Problem List Patient Active Problem List   Diagnosis Date Noted  . Lymphedema of arm 04/12/2015  . Family history of breast cancer   . 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 breast 11/17/2014  . Hypothyroidism 10/12/2007  . Allergic rhinitis 10/12/2007  . BREAST CANCER, HX OF 10/12/2007  . DIVERTICULITIS, HX OF 10/12/2007  . OSTEOARTHRITIS 09/09/2007    Jossiah Smoak,MARTI COOPER, PT 05/03/2015,  12:13 PM  [place]  [place], [place], [address] Phone: [phone]   Fax:  [phone]

## 2015-05-04 ENCOUNTER — Ambulatory Visit
Admission: RE | Admit: 2015-05-04 | Discharge: 2015-05-04 | Disposition: A | Discharge status: Home / Self Care | Payer: Medicare Other | Source: Ambulatory Visit | Attending: Radiation Oncology | Admitting: Radiation Oncology

## 2015-05-04 ENCOUNTER — Ambulatory Visit: Payer: Medicare Other

## 2015-05-04 DIAGNOSIS — C50211 Malignant neoplasm of upper-inner quadrant of right female breast: Secondary | ICD-10-CM | POA: Diagnosis not present

## 2015-05-04 DIAGNOSIS — M7989 Other specified soft tissue disorders: Secondary | ICD-10-CM | POA: Diagnosis not present

## 2015-05-04 DIAGNOSIS — L599 Disorder of the skin and subcutaneous tissue related to radiation, unspecified: Secondary | ICD-10-CM | POA: Diagnosis not present

## 2015-05-04 DIAGNOSIS — Z510 Encounter for antineoplastic radiation therapy: Secondary | ICD-10-CM | POA: Diagnosis not present

## 2015-05-08 ENCOUNTER — Ambulatory Visit
Admission: RE | Admit: 2015-05-08 | Discharge: 2015-05-08 | Disposition: A | Discharge status: Home / Self Care | Payer: Medicare Other | Source: Ambulatory Visit | Attending: Radiation Oncology | Admitting: Radiation Oncology

## 2015-05-08 ENCOUNTER — Ambulatory Visit: Payer: Medicare Other

## 2015-05-08 VITALS — BP 123/64 | HR 76 | Temp 98.0°F | Wt 178.5 lb

## 2015-05-08 DIAGNOSIS — C50211 Malignant neoplasm of upper-inner quadrant of right female breast: Secondary | ICD-10-CM | POA: Diagnosis not present

## 2015-05-08 DIAGNOSIS — M7989 Other specified soft tissue disorders: Secondary | ICD-10-CM | POA: Diagnosis not present

## 2015-05-08 DIAGNOSIS — L599 Disorder of the skin and subcutaneous tissue related to radiation, unspecified: Secondary | ICD-10-CM | POA: Diagnosis not present

## 2015-05-08 DIAGNOSIS — Z510 Encounter for antineoplastic radiation therapy: Secondary | ICD-10-CM | POA: Diagnosis not present

## 2015-05-08 NOTE — Progress Notes (Signed)
Weekly Management Note Current Dose: 36  Gy  Projected Dose: 61 Gy   Narrative:  The patient presents for routine under treatment assessment.  CBCT/MVCT images/Port film x-rays were reviewed.  The chart was checked. Doing well. No complaints. Question about boost.   Physical Findings: Weight: 178 lb 8 oz (80.967 kg). Unchanged. Slightly pink. Left arm wrapped.   Impression:  The patient is tolerating radiation.  Plan:  Continue treatment as planned. Discussed boost.

## 2015-05-08 NOTE — Progress Notes (Signed)
Weekly assessment of radiation to right breast.Completed 20 of 33 treatments.Denies pain.Breast tender at times.Skin pink.Continue application of radiaplex twice daily.

## 2015-05-09 ENCOUNTER — Ambulatory Visit: Payer: Medicare Other | Attending: General Surgery | Admitting: Physical Therapy

## 2015-05-09 ENCOUNTER — Encounter: Payer: Self-pay | Admitting: Physical Therapy

## 2015-05-09 ENCOUNTER — Ambulatory Visit
Admission: RE | Admit: 2015-05-09 | Discharge: 2015-05-09 | Disposition: A | Discharge status: Home / Self Care | Payer: Medicare Other | Source: Ambulatory Visit | Attending: Radiation Oncology | Admitting: Radiation Oncology

## 2015-05-09 ENCOUNTER — Ambulatory Visit: Payer: Medicare Other

## 2015-05-09 DIAGNOSIS — G8929 Other chronic pain: Secondary | ICD-10-CM | POA: Diagnosis not present

## 2015-05-09 DIAGNOSIS — I890 Lymphedema, not elsewhere classified: Secondary | ICD-10-CM | POA: Diagnosis not present

## 2015-05-09 DIAGNOSIS — M25611 Stiffness of right shoulder, not elsewhere classified: Secondary | ICD-10-CM | POA: Insufficient documentation

## 2015-05-09 DIAGNOSIS — C50211 Malignant neoplasm of upper-inner quadrant of right female breast: Secondary | ICD-10-CM | POA: Diagnosis not present

## 2015-05-09 DIAGNOSIS — E8989 Other postprocedural endocrine and metabolic complications and disorders: Secondary | ICD-10-CM | POA: Diagnosis not present

## 2015-05-09 DIAGNOSIS — Z510 Encounter for antineoplastic radiation therapy: Secondary | ICD-10-CM | POA: Diagnosis not present

## 2015-05-09 DIAGNOSIS — L599 Disorder of the skin and subcutaneous tissue related to radiation, unspecified: Secondary | ICD-10-CM | POA: Diagnosis not present

## 2015-05-09 DIAGNOSIS — M7989 Other specified soft tissue disorders: Secondary | ICD-10-CM | POA: Diagnosis not present

## 2015-05-09 DIAGNOSIS — M549 Dorsalgia, unspecified: Secondary | ICD-10-CM | POA: Insufficient documentation

## 2015-05-09 NOTE — Therapy (Signed)
[place], [place], [address] Phone: [phone]   Fax:  [phone]  Physical Therapy Treatment  Patient Details  Name: Shawna Marquez MRN: [id number] Date of Birth: 1940/12/14 Referring Provider:  Chauncey Cruel, MD  Encounter Date: 05/09/2015      PT End of Session - 05/09/15 1511    PT Stop Time 56      Past Medical History  Diagnosis Date  . ALLERGIC RHINITIS 10/12/2007  . BREAST CANCER, HX OF 10/12/2007    right breast diagnosed - 11/2014  . DIVERTICULITIS, HX OF 10/12/2007  . HYPOTHYROIDISM, PRIMARY 10/12/2007  . 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 Wears glasses   . Family history of breast cancer   . Complication of anesthesia     bp will drop,hard to wake up  . Sleep apnea     USES cpap NIGHTLY  . OSTEOARTHRITIS 09/09/2007    back     Past Surgical History  Procedure Laterality Date  . Colovaginal fistula  2007    colon resection  . Abdominal hysterectomy  1977  . Hernia repair  2007    ventral  . Colon surgery  2006    part colectomy  . Mastectomy  1997    lt mast-axillary node dissection  . Tonsillectomy    . Colonoscopy  2007  . Wisdom tooth extraction    . Eye surgery      both cataracts  . Radioactive seed guided mastectomy with axillary sentinel lymph node biopsy Right 11/28/2014    Procedure: RADIOACTIVE SEED GUIDED RIGHT LUMPECTOMY WITH RIGHT AXILLARY SENTINEL LYMPH NODE BIOPSY;  Surgeon: Rolm Bookbinder, MD;  Location: [place];  Service: General;  Laterality: Right;  . Re-excision of breast cancer,superior margins Right 01/01/2015    Procedure: RIGHT BREAST MARGIN EXCISION;  Surgeon: Rolm Bookbinder, MD;  Location: [place];  Service: General;  Laterality: Right;  . Portacath placement Left 01/01/2015    Procedure: INSERTION PORT-A-CATH;  Surgeon: Rolm Bookbinder, MD;  Location: [place];  Service: General;  Laterality:  Left;    There were no vitals filed for this visit.  Visit Diagnosis:  Lymphedema of upper extremity following lymphadenectomy      Subjective Assessment - 05/09/15 1426    Subjective Stayed wrapped after last treatment until the night of 05/06/15.  I've worn the stockinette.  I did my own manual lymph drainage and did pretty good.   Pertinent History Right breast cancer diagnosed December 2015 with lumpectomy 11/28/14 with margins not clear; 01/01/15 had second lumpectomy and portacath placement.  Had chemo and is now undergoing radiation til the end of June .  Will also take Arimidex.  Two lymph nodes removed, one positive.  h/o left breast cancer with mastectomy 1997 with 16 lymph nodes  removed.                                                            Patient Stated Goals to get rid of swelling   Currently in Pain? No/denies               LYMPHEDEMA/ONCOLOGY QUESTIONNAIRE - 05/09/15 1457    Left Upper Extremity Lymphedema   10 cm Proximal to Olecranon Process 31.2 cm  Olecranon Process 28.8 cm   10 cm Proximal to Ulnar Styloid Process 26 cm   Just Proximal to Ulnar Styloid Process 18.7 cm   Across Hand at [employer] 19.5 cm   At [place] of 2nd Digit 6.6 cm                  [facility] Adult PT Treatment/Exercise - 05/09/15 0001    Manual Therapy   Manual Therapy Manual Lymphatic Drainage (MLD);Compression Bandaging   Edema Management circumference measurements taken   Manual Lymphatic Drainage (MLD) In supine, short neck, superficial and deep abdomen, left groin and axillo-inguinal anastomosis, and left UE from fingers to shoulder.   Compression Bandaging Lotion applied; peach small dot foam applied to left medial distal upper arm to reduce edema; bandaging with TG soft, Elastomull on all five fingers, Artiflex, and 3 short stretch bandages from hand to shoulder                        Long Term Clinic Goals - 05/01/15 1016    CC Long Term Goal   #1   Title Patient will have a circumferential reduction of    1.5       cm at   10   cm above the         ulnar styloid    Status On-going   CC Long Term Goal  #2   Title Patient will have a circumferential reduction of      2   cm at          10 cm above the             olecranon process  3 cm reduction 05/01/15   Status Achieved   CC Long Term Goal  #3   Title Patient will be know how to obtain and use compression garments for maintenance phase of treatment   Status On-going   CC Long Term Goal  #4   Title Patient will decrease the DASH score to < 50   to demonstrate increased functional use of upper extremity   Status On-going            Plan - 05/09/15 1511    Clinical Impression Statement Measurements of left upper extremity taken today were slightly increased but that is likely due to being unwrapped x2 days.  Area on medial upper arm where foam was placed appears tbe softer and reduced.   Pt will benefit from skilled therapeutic intervention in order to improve on the following deficits Increased edema   Rehab Potential Excellent   Clinical Impairments Affecting Rehab Potential Concurrent radiation to right upper quadrant.     PT Frequency 3x / week   PT Treatment/Interventions Manual lymph drainage;Compression bandaging   PT Next Visit Plan complete decongestive therapy   Consulted and Agree with Plan of Care Patient        Problem List Patient Active Problem List   Diagnosis Date Noted  . Lymphedema of arm 04/12/2015  . Family history of breast cancer   . 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 breast 11/17/2014  . Hypothyroidism 10/12/2007  . Allergic rhinitis 10/12/2007  . BREAST CANCER, HX OF 10/12/2007  . DIVERTICULITIS, HX OF 10/12/2007  . OSTEOARTHRITIS 09/09/2007    Annia Friendly, PT 05/09/2015, 3:15 PM  [place], [place], [address] Phone: [phone]    Fax:  [phone]

## 2015-05-10 ENCOUNTER — Ambulatory Visit: Payer: Medicare Other

## 2015-05-10 ENCOUNTER — Ambulatory Visit
Admission: RE | Admit: 2015-05-10 | Discharge: 2015-05-10 | Disposition: A | Discharge status: Home / Self Care | Payer: Medicare Other | Source: Ambulatory Visit | Attending: Radiation Oncology | Admitting: Radiation Oncology

## 2015-05-10 DIAGNOSIS — C50211 Malignant neoplasm of upper-inner quadrant of right female breast: Secondary | ICD-10-CM | POA: Diagnosis not present

## 2015-05-10 DIAGNOSIS — L599 Disorder of the skin and subcutaneous tissue related to radiation, unspecified: Secondary | ICD-10-CM | POA: Diagnosis not present

## 2015-05-10 DIAGNOSIS — M7989 Other specified soft tissue disorders: Secondary | ICD-10-CM | POA: Diagnosis not present

## 2015-05-10 DIAGNOSIS — Z510 Encounter for antineoplastic radiation therapy: Secondary | ICD-10-CM | POA: Diagnosis not present

## 2015-05-11 ENCOUNTER — Ambulatory Visit: Payer: Medicare Other

## 2015-05-11 ENCOUNTER — Ambulatory Visit
Admission: RE | Admit: 2015-05-11 | Discharge: 2015-05-11 | Disposition: A | Discharge status: Home / Self Care | Payer: Medicare Other | Source: Ambulatory Visit | Attending: Radiation Oncology | Admitting: Radiation Oncology

## 2015-05-11 ENCOUNTER — Ambulatory Visit: Payer: Medicare Other | Admitting: Physical Therapy

## 2015-05-11 DIAGNOSIS — C50211 Malignant neoplasm of upper-inner quadrant of right female breast: Secondary | ICD-10-CM | POA: Diagnosis not present

## 2015-05-11 DIAGNOSIS — I890 Lymphedema, not elsewhere classified: Principal | ICD-10-CM

## 2015-05-11 DIAGNOSIS — E8989 Other postprocedural endocrine and metabolic complications and disorders: Secondary | ICD-10-CM | POA: Diagnosis not present

## 2015-05-11 DIAGNOSIS — Z510 Encounter for antineoplastic radiation therapy: Secondary | ICD-10-CM | POA: Diagnosis not present

## 2015-05-11 DIAGNOSIS — L599 Disorder of the skin and subcutaneous tissue related to radiation, unspecified: Secondary | ICD-10-CM | POA: Diagnosis not present

## 2015-05-11 DIAGNOSIS — M25611 Stiffness of right shoulder, not elsewhere classified: Secondary | ICD-10-CM | POA: Diagnosis not present

## 2015-05-11 DIAGNOSIS — M549 Dorsalgia, unspecified: Secondary | ICD-10-CM | POA: Diagnosis not present

## 2015-05-11 DIAGNOSIS — G8929 Other chronic pain: Secondary | ICD-10-CM | POA: Diagnosis not present

## 2015-05-11 DIAGNOSIS — M7989 Other specified soft tissue disorders: Secondary | ICD-10-CM | POA: Diagnosis not present

## 2015-05-11 NOTE — Therapy (Signed)
[place], [place], [address] Phone: [phone]   Fax:  [phone]  Physical Therapy Treatment  Patient Details  Name: Shawna Marquez MRN: [id number] Date of Birth: December 02, 1941 Referring Provider:  Marletta Lor, MD  Encounter Date: 05/11/2015      PT End of Session - 05/11/15 1155    Visit Number 7   Number of Visits 13   Date for PT Re-Evaluation 05/21/15   PT Start Time 1110   PT Stop Time 1150   PT Time Calculation (min) 40 min   Activity Tolerance Patient tolerated treatment well   Behavior During Therapy [facility] for tasks assessed/performed      Past Medical History  Diagnosis Date  . ALLERGIC RHINITIS 10/12/2007  . BREAST CANCER, HX OF 10/12/2007    right breast diagnosed - 11/2014  . DIVERTICULITIS, HX OF 10/12/2007  . HYPOTHYROIDISM, PRIMARY 10/12/2007  . 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 Wears glasses   . Family history of breast cancer   . Complication of anesthesia     bp will drop,hard to wake up  . Sleep apnea     USES cpap NIGHTLY  . OSTEOARTHRITIS 09/09/2007    back     Past Surgical History  Procedure Laterality Date  . Colovaginal fistula  2007    colon resection  . Abdominal hysterectomy  1977  . Hernia repair  2007    ventral  . Colon surgery  2006    part colectomy  . Mastectomy  1997    lt mast-axillary node dissection  . Tonsillectomy    . Colonoscopy  2007  . Wisdom tooth extraction    . Eye surgery      both cataracts  . Radioactive seed guided mastectomy with axillary sentinel lymph node biopsy Right 11/28/2014    Procedure: RADIOACTIVE SEED GUIDED RIGHT LUMPECTOMY WITH RIGHT AXILLARY SENTINEL LYMPH NODE BIOPSY;  Surgeon: Rolm Bookbinder, MD;  Location: [place];  Service: General;  Laterality: Right;  . Re-excision of breast cancer,superior margins Right 01/01/2015    Procedure: RIGHT BREAST MARGIN EXCISION;   Surgeon: Rolm Bookbinder, MD;  Location: [place];  Service: General;  Laterality: Right;  . Portacath placement Left 01/01/2015    Procedure: INSERTION PORT-A-CATH;  Surgeon: Rolm Bookbinder, MD;  Location: [place];  Service: General;  Laterality: Left;    There were no vitals filed for this visit.  Visit Diagnosis:  Lymphedema of upper extremity following lymphadenectomy      Subjective Assessment - 05/11/15 1113    Subjective Arm is getting better.  It was so big and hard up here but now it's gone.   Currently in Pain? No/denies                         [facility] Adult PT Treatment/Exercise - 05/11/15 0001    Manual Therapy   Manual Lymphatic Drainage (MLD) In supine, short neck, superficial and deep abdomen, left groin and axillo-inguinal anastomosis, and left UE from fingers to shoulder.   Compression Bandaging Lotion applied; peach small dot foam applied to left proximal forearm to reduce induration; bandaging with TG soft, Elastomull on all five fingers, Artiflex, and 3 short stretch bandages from hand to shoulder                        [facility] - 05/11/15 1158  CC Long Term Goal  #1   Status On-going   CC Long Term Goal  #3   Status On-going   CC Long Term Goal  #4   Status On-going            Plan - 05/11/15 1156    Clinical Impression Statement Doing well and pleased with left arm.  Probably ready to be measured for compression sleeves and glove.   Pt will benefit from skilled therapeutic intervention in order to improve on the following deficits Increased edema   Rehab Potential Excellent   PT Frequency 3x / week   PT Duration 4 weeks   PT Treatment/Interventions Manual lymph drainage;Compression bandaging   PT Next Visit Plan work with patient on how to obtain compression sleeves and glove; continue complete decongestive therapy   Consulted and Agree with Plan of Care Patient        Problem List Patient Active  Problem List   Diagnosis Date Noted  . Lymphedema of arm 04/12/2015  . Family history of breast cancer   . 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 breast 11/17/2014  . Hypothyroidism 10/12/2007  . Allergic rhinitis 10/12/2007  . BREAST CANCER, HX OF 10/12/2007  . DIVERTICULITIS, HX OF 10/12/2007  . OSTEOARTHRITIS 09/09/2007    [place],DONNA 05/11/2015, 11:59 AM  [place], [place], [address] Phone: [phone]   Fax:  [place], PT 05/11/2015 11:59 AM

## 2015-05-14 ENCOUNTER — Encounter: Payer: Self-pay | Admitting: Physical Therapy

## 2015-05-14 ENCOUNTER — Ambulatory Visit: Payer: Medicare Other

## 2015-05-14 ENCOUNTER — Ambulatory Visit
Admission: RE | Admit: 2015-05-14 | Discharge: 2015-05-14 | Disposition: A | Discharge status: Home / Self Care | Payer: Medicare Other | Source: Ambulatory Visit | Attending: Radiation Oncology | Admitting: Radiation Oncology

## 2015-05-14 ENCOUNTER — Ambulatory Visit: Payer: Medicare Other | Admitting: Physical Therapy

## 2015-05-14 DIAGNOSIS — Z510 Encounter for antineoplastic radiation therapy: Secondary | ICD-10-CM | POA: Diagnosis not present

## 2015-05-14 DIAGNOSIS — M25611 Stiffness of right shoulder, not elsewhere classified: Secondary | ICD-10-CM | POA: Diagnosis not present

## 2015-05-14 DIAGNOSIS — E8989 Other postprocedural endocrine and metabolic complications and disorders: Secondary | ICD-10-CM

## 2015-05-14 DIAGNOSIS — M7989 Other specified soft tissue disorders: Secondary | ICD-10-CM | POA: Diagnosis not present

## 2015-05-14 DIAGNOSIS — C50211 Malignant neoplasm of upper-inner quadrant of right female breast: Secondary | ICD-10-CM | POA: Diagnosis not present

## 2015-05-14 DIAGNOSIS — L599 Disorder of the skin and subcutaneous tissue related to radiation, unspecified: Secondary | ICD-10-CM | POA: Diagnosis not present

## 2015-05-14 DIAGNOSIS — I890 Lymphedema, not elsewhere classified: Secondary | ICD-10-CM | POA: Diagnosis not present

## 2015-05-14 DIAGNOSIS — M549 Dorsalgia, unspecified: Secondary | ICD-10-CM | POA: Diagnosis not present

## 2015-05-14 DIAGNOSIS — G8929 Other chronic pain: Secondary | ICD-10-CM | POA: Diagnosis not present

## 2015-05-14 NOTE — Therapy (Signed)
[place], [place], [address] Phone: [phone]   Fax:  [phone]  Physical Therapy Treatment  Patient Details  Name: Shawna Marquez MRN: [id number] Date of Birth: [birth year] Referring Provider:  Nicholas Lose, MD  Encounter Date: 05/14/2015      PT End of Session - 05/14/15 1612    Visit Number 8   Number of Visits 13   Date for PT Re-Evaluation 05/21/15   PT Start Time 2876   PT Stop Time 1558   PT Time Calculation (min) 43 min   Activity Tolerance Patient tolerated treatment well   Behavior During Therapy [facility] for tasks assessed/performed      Past Medical History  Diagnosis Date  . ALLERGIC RHINITIS 10/12/2007  . BREAST CANCER, HX OF 10/12/2007    right breast diagnosed - 11/2014  . DIVERTICULITIS, HX OF 10/12/2007  . HYPOTHYROIDISM, PRIMARY 10/12/2007  . 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 Wears glasses   . Family history of breast cancer   . Complication of anesthesia     bp will drop,hard to wake up  . Sleep apnea     USES cpap NIGHTLY  . OSTEOARTHRITIS 09/09/2007    back     Past Surgical History  Procedure Laterality Date  . Colovaginal fistula  2007    colon resection  . Abdominal hysterectomy  1977  . Hernia repair  2007    ventral  . Colon surgery  2006    part colectomy  . Mastectomy  1997    lt mast-axillary node dissection  . Tonsillectomy    . Colonoscopy  2007  . Wisdom tooth extraction    . Eye surgery      both cataracts  . Radioactive seed guided mastectomy with axillary sentinel lymph node biopsy Right 11/28/2014    Procedure: RADIOACTIVE SEED GUIDED RIGHT LUMPECTOMY WITH RIGHT AXILLARY SENTINEL LYMPH NODE BIOPSY;  Surgeon: Rolm Bookbinder, MD;  Location: [place];  Service: General;  Laterality: Right;  . Re-excision of breast cancer,superior margins Right 01/01/2015    Procedure: RIGHT BREAST MARGIN EXCISION;   Surgeon: Rolm Bookbinder, MD;  Location: [place];  Service: General;  Laterality: Right;  . Portacath placement Left 01/01/2015    Procedure: INSERTION PORT-A-CATH;  Surgeon: Rolm Bookbinder, MD;  Location: [place];  Service: General;  Laterality: Left;    There were no vitals filed for this visit.  Visit Diagnosis:  Lymphedema of upper extremity following lymphadenectomy      Subjective Assessment - 05/14/15 1521    Subjective No problems and I stayed wrapped until this afternoon.   Pertinent History Right breast cancer diagnosed December 2015 with lumpectomy 11/28/14 with margins not clear; 01/01/15 had second lumpectomy and portacath placement.  Had chemo and is now undergoing radiation til the end of June .  Will also take Arimidex.  Two lymph nodes removed, one positive.  h/o left breast cancer with mastectomy 1997 with 16 lymph nodes  removed.                                                            Patient Stated Goals to get rid of swelling   Currently in Pain? No/denies  [facility] Adult PT Treatment/Exercise - 05/14/15 0001    Manual Therapy   Manual Lymphatic Drainage (MLD) In supine, short neck, superficial and deep abdomen, left groin and axillo-inguinal anastomosis, and left UE from fingers to shoulder.   Compression Bandaging Lotion applied; peach small dot foam applied to left proximal forearm and a seperate small piece applied to distal medial upper arm to reduce induration; bandaging with TG soft, Elastomull on all five fingers, Artiflex, and 3 short stretch bandages from hand to shoulder             Long Term Clinic Goals - 05/11/15 1158    CC Long Term Goal  #1   Status On-going   CC Long Term Goal  #3   Status On-going   CC Long Term Goal  #4   Status On-going            Plan - 05/14/15 1612    Clinical Impression Statement Patient's arm appears to be doing very well!  It is less swollen and less fibrotic in all areas.  She is very pleased with  her progress.  Demographic information was faxed to Rexford Maus (fitter) today to being the process of getting her compression garments.  She will benefit from continued PT until garments arrive.   Pt will benefit from skilled therapeutic intervention in order to improve on the following deficits Increased edema   Rehab Potential Excellent   Clinical Impairments Affecting Rehab Potential Concurrent radiation to right upper quadrant.     PT Frequency 3x / week   PT Duration 4 weeks   PT Treatment/Interventions Manual lymph drainage;Compression bandaging   PT Next Visit Plan continue complete decongestive therapy; info has been sent to fitter for garment fitting to begin   Consulted and Agree with Plan of Care Patient        Problem List Patient Active Problem List   Diagnosis Date Noted  . Lymphedema of arm 04/12/2015  . Family history of breast cancer   . 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 breast 11/17/2014  . Hypothyroidism 10/12/2007  . Allergic rhinitis 10/12/2007  . BREAST CANCER, HX OF 10/12/2007  . DIVERTICULITIS, HX OF 10/12/2007  . OSTEOARTHRITIS 09/09/2007    Annia Friendly, PT 05/14/2015, 4:15 PM  [place], [place], [address] Phone: [phone]   Fax:  [phone]

## 2015-05-15 ENCOUNTER — Ambulatory Visit
Admission: RE | Admit: 2015-05-15 | Discharge: 2015-05-15 | Disposition: A | Discharge status: Home / Self Care | Payer: Medicare Other | Source: Ambulatory Visit | Attending: Radiation Oncology | Admitting: Radiation Oncology

## 2015-05-15 ENCOUNTER — Ambulatory Visit: Payer: Medicare Other

## 2015-05-15 VITALS — BP 127/72 | HR 83 | Temp 98.3°F | Wt 174.5 lb

## 2015-05-15 DIAGNOSIS — C50211 Malignant neoplasm of upper-inner quadrant of right female breast: Secondary | ICD-10-CM

## 2015-05-15 DIAGNOSIS — M7989 Other specified soft tissue disorders: Secondary | ICD-10-CM | POA: Diagnosis not present

## 2015-05-15 DIAGNOSIS — Z510 Encounter for antineoplastic radiation therapy: Secondary | ICD-10-CM | POA: Diagnosis not present

## 2015-05-15 DIAGNOSIS — L599 Disorder of the skin and subcutaneous tissue related to radiation, unspecified: Secondary | ICD-10-CM | POA: Diagnosis not present

## 2015-05-15 NOTE — Progress Notes (Signed)
Weekly Management Note Current Dose:  45 Gy  Projected Dose: 61 Gy   Narrative:  The patient presents for routine under treatment assessment.  CBCT/MVCT images/Port film x-rays were reviewed.  The chart was checked. Doing well. No complaints. Skin is sore.    Physical Findings: Weight: 174 lb 8 oz (79.153 kg). Pink right breast. Left arm/hand is wrapped.  Impression:  The patient is tolerating radiation.  Plan:  Continue treatment as planned. Continue PT for left arm. Continue radiaplex.

## 2015-05-16 ENCOUNTER — Ambulatory Visit: Payer: Medicare Other

## 2015-05-16 ENCOUNTER — Ambulatory Visit
Admission: RE | Admit: 2015-05-16 | Discharge: 2015-05-16 | Disposition: A | Discharge status: Home / Self Care | Payer: Medicare Other | Source: Ambulatory Visit | Attending: Radiation Oncology | Admitting: Radiation Oncology

## 2015-05-16 DIAGNOSIS — G8929 Other chronic pain: Secondary | ICD-10-CM

## 2015-05-16 DIAGNOSIS — M7989 Other specified soft tissue disorders: Secondary | ICD-10-CM | POA: Diagnosis not present

## 2015-05-16 DIAGNOSIS — M25611 Stiffness of right shoulder, not elsewhere classified: Secondary | ICD-10-CM

## 2015-05-16 DIAGNOSIS — M549 Dorsalgia, unspecified: Secondary | ICD-10-CM

## 2015-05-16 DIAGNOSIS — Z510 Encounter for antineoplastic radiation therapy: Secondary | ICD-10-CM | POA: Diagnosis not present

## 2015-05-16 DIAGNOSIS — L599 Disorder of the skin and subcutaneous tissue related to radiation, unspecified: Secondary | ICD-10-CM | POA: Diagnosis not present

## 2015-05-16 DIAGNOSIS — E8989 Other postprocedural endocrine and metabolic complications and disorders: Secondary | ICD-10-CM | POA: Diagnosis not present

## 2015-05-16 DIAGNOSIS — I890 Lymphedema, not elsewhere classified: Principal | ICD-10-CM

## 2015-05-16 DIAGNOSIS — C50211 Malignant neoplasm of upper-inner quadrant of right female breast: Secondary | ICD-10-CM | POA: Diagnosis not present

## 2015-05-16 NOTE — Therapy (Signed)
[place], [place], [address] Phone: [phone]   Fax:  [phone]  Physical Therapy Treatment  Patient Details  Name: Shawna Marquez MRN: [id number] Date of Birth: [birth year] Referring Provider:  Chauncey Cruel, MD  Encounter Date: 05/16/2015      PT End of Session - 05/16/15 1018    Visit Number 9   Number of Visits 13   Date for PT Re-Evaluation 05/21/15   PT Start Time 0938   PT Stop Time 1017   PT Time Calculation (min) 39 min   Activity Tolerance Patient tolerated treatment well   Behavior During Therapy [name] for tasks assessed/performed      Past Medical History  Diagnosis Date  . ALLERGIC RHINITIS 10/12/2007  . BREAST CANCER, HX OF 10/12/2007    right breast diagnosed - 11/2014  . DIVERTICULITIS, HX OF 10/12/2007  . HYPOTHYROIDISM, PRIMARY 10/12/2007  . 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 Wears glasses   . Family history of breast cancer   . Complication of anesthesia     bp will drop,hard to wake up  . Sleep apnea     USES cpap NIGHTLY  . OSTEOARTHRITIS 09/09/2007    back     Past Surgical History  Procedure Laterality Date  . Colovaginal fistula  2007    colon resection  . Abdominal hysterectomy  1977  . Hernia repair  2007    ventral  . Colon surgery  2006    part colectomy  . Mastectomy  1997    lt mast-axillary node dissection  . Tonsillectomy    . Colonoscopy  2007  . Wisdom tooth extraction    . Eye surgery      both cataracts  . Radioactive seed guided mastectomy with axillary sentinel lymph node biopsy Right 11/28/2014    Procedure: RADIOACTIVE SEED GUIDED RIGHT LUMPECTOMY WITH RIGHT AXILLARY SENTINEL LYMPH NODE BIOPSY;  Surgeon: Rolm Bookbinder, MD;  Location: [place];  Service: General;  Laterality: Right;  . Re-excision of breast cancer,superior margins Right 01/01/2015    Procedure: RIGHT BREAST MARGIN EXCISION;   Surgeon: Rolm Bookbinder, MD;  Location: [place];  Service: General;  Laterality: Right;  . Portacath placement Left 01/01/2015    Procedure: INSERTION PORT-A-CATH;  Surgeon: Rolm Bookbinder, MD;  Location: [place];  Service: General;  Laterality: Left;    There were no vitals filed for this visit.  Visit Diagnosis:  Lymphedema of upper extremity following lymphadenectomy  Back pain, chronic  Stiffness of joint, shoulder region, right      Subjective Assessment - 05/16/15 0941    Subjective Took bandages off this morning and washed my arm. Its looking good!   Currently in Pain? No/denies               LYMPHEDEMA/ONCOLOGY QUESTIONNAIRE - 05/16/15 0942    Left Upper Extremity Lymphedema   10 cm Proximal to Olecranon Process 31.1 cm   Olecranon Process 26.1 cm   10 cm Proximal to Ulnar Styloid Process 23.2 cm   Just Proximal to Ulnar Styloid Process 16.6 cm   Across Hand at [employer] 18.5 cm   At [place] of 2nd Digit 6.4 cm                  [facility] Adult PT Treatment/Exercise - 05/16/15 0001    Manual Therapy   Manual Lymphatic Drainage (MLD) In supine, short neck, superficial and deep  abdomen, left groin and axillo-inguinal anastomosis, and left UE from fingers to shoulder.   Compression Bandaging Lotion applied; peach small dot foam applied to left proximal forearm and a seperate small piece applied to distal medial upper arm to reduce induration; bandaging with TG soft, Elastomull fingers 2-5 fingers, Artiflex, and 3 short stretch bandages from hand to shoulder                        Long Term Clinic Goals - 05/16/15 1020    CC Long Term Goal  #1   Title Patient will have a circumferential reduction of    1.5       cm at   10   cm above the         ulnar styloid    Status Achieved   CC Long Term Goal  #3   Title Patient will be know how to obtain and use compression garments for maintenance phase of treatment   Status On-going   CC Long  Term Goal  #4   Title Patient will decrease the DASH score to < 50   to demonstrate increased functional use of upper extremity   Status On-going            Plan - 05/16/15 1019    Clinical Impression Statement Pts circumference measurements were greatly reduced today at most areas. She is doing well with compliance of treatment and tolerating bandaging well.    Pt will benefit from skilled therapeutic intervention in order to improve on the following deficits Increased edema   Rehab Potential Excellent   Clinical Impairments Affecting Rehab Potential Concurrent radiation to right upper quadrant.     PT Frequency 3x / week   PT Duration 4 weeks   PT Treatment/Interventions Manual lymph drainage;Compression bandaging   PT Next Visit Plan Gcode next visit for 10th visit; continue complete decongestive therapy   Consulted and Agree with Plan of Care Patient        Problem List Patient Active Problem List   Diagnosis Date Noted  . Lymphedema of arm 04/12/2015  . Family history of breast cancer   . 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 breast 11/17/2014  . Hypothyroidism 10/12/2007  . Allergic rhinitis 10/12/2007  . BREAST CANCER, HX OF 10/12/2007  . DIVERTICULITIS, HX OF 10/12/2007  . OSTEOARTHRITIS 09/09/2007    Otelia Limes, PTA 05/16/2015, 10:21 AM  [place], [place], [address] Phone: [phone]   Fax:  [phone]

## 2015-05-17 ENCOUNTER — Ambulatory Visit
Admission: RE | Admit: 2015-05-17 | Discharge: 2015-05-17 | Disposition: A | Discharge status: Home / Self Care | Payer: Medicare Other | Source: Ambulatory Visit | Attending: Radiation Oncology | Admitting: Radiation Oncology

## 2015-05-17 ENCOUNTER — Ambulatory Visit: Payer: Medicare Other

## 2015-05-17 DIAGNOSIS — Z510 Encounter for antineoplastic radiation therapy: Secondary | ICD-10-CM | POA: Diagnosis not present

## 2015-05-17 DIAGNOSIS — C50211 Malignant neoplasm of upper-inner quadrant of right female breast: Secondary | ICD-10-CM | POA: Diagnosis not present

## 2015-05-17 DIAGNOSIS — L599 Disorder of the skin and subcutaneous tissue related to radiation, unspecified: Secondary | ICD-10-CM | POA: Diagnosis not present

## 2015-05-17 DIAGNOSIS — M7989 Other specified soft tissue disorders: Secondary | ICD-10-CM | POA: Diagnosis not present

## 2015-05-18 ENCOUNTER — Ambulatory Visit
Admission: RE | Admit: 2015-05-18 | Discharge: 2015-05-18 | Disposition: A | Discharge status: Home / Self Care | Payer: Medicare Other | Source: Ambulatory Visit | Attending: Radiation Oncology | Admitting: Radiation Oncology

## 2015-05-18 ENCOUNTER — Ambulatory Visit: Payer: Medicare Other

## 2015-05-18 ENCOUNTER — Ambulatory Visit: Payer: Medicare Other | Admitting: Physical Therapy

## 2015-05-18 DIAGNOSIS — E8989 Other postprocedural endocrine and metabolic complications and disorders: Secondary | ICD-10-CM | POA: Diagnosis not present

## 2015-05-18 DIAGNOSIS — L599 Disorder of the skin and subcutaneous tissue related to radiation, unspecified: Secondary | ICD-10-CM | POA: Diagnosis not present

## 2015-05-18 DIAGNOSIS — G8929 Other chronic pain: Secondary | ICD-10-CM | POA: Diagnosis not present

## 2015-05-18 DIAGNOSIS — M25611 Stiffness of right shoulder, not elsewhere classified: Secondary | ICD-10-CM | POA: Diagnosis not present

## 2015-05-18 DIAGNOSIS — I890 Lymphedema, not elsewhere classified: Principal | ICD-10-CM

## 2015-05-18 DIAGNOSIS — M7989 Other specified soft tissue disorders: Secondary | ICD-10-CM | POA: Diagnosis not present

## 2015-05-18 DIAGNOSIS — Z510 Encounter for antineoplastic radiation therapy: Secondary | ICD-10-CM | POA: Diagnosis not present

## 2015-05-18 DIAGNOSIS — C50211 Malignant neoplasm of upper-inner quadrant of right female breast: Secondary | ICD-10-CM | POA: Diagnosis not present

## 2015-05-18 DIAGNOSIS — M549 Dorsalgia, unspecified: Secondary | ICD-10-CM | POA: Diagnosis not present

## 2015-05-18 NOTE — Therapy (Signed)
[place], [place], [address] Phone: [phone]   Fax:  [phone]  Physical Therapy Treatment  Patient Details  Name: Shawna Marquez MRN: [id number] Date of Birth: 1941/03/20 Referring Provider:  Chauncey Cruel, MD  Encounter Date: 05/18/2015      PT End of Session - 05/18/15 1258    Visit Number 10   Number of Visits 13   Date for PT Re-Evaluation 05/21/15   PT Start Time 0932   PT Stop Time 1020   PT Time Calculation (min) 48 min   Activity Tolerance Patient tolerated treatment well   Behavior During Therapy [facility] for tasks assessed/performed      Past Medical History  Diagnosis Date  . ALLERGIC RHINITIS 10/12/2007  . BREAST CANCER, HX OF 10/12/2007    right breast diagnosed - 11/2014  . DIVERTICULITIS, HX OF 10/12/2007  . HYPOTHYROIDISM, PRIMARY 10/12/2007  . 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 Wears glasses   . Family history of breast cancer   . Complication of anesthesia     bp will drop,hard to wake up  . Sleep apnea     USES cpap NIGHTLY  . OSTEOARTHRITIS 09/09/2007    back     Past Surgical History  Procedure Laterality Date  . Colovaginal fistula  2007    colon resection  . Abdominal hysterectomy  1977  . Hernia repair  2007    ventral  . Colon surgery  2006    part colectomy  . Mastectomy  1997    lt mast-axillary node dissection  . Tonsillectomy    . Colonoscopy  2007  . Wisdom tooth extraction    . Eye surgery      both cataracts  . Radioactive seed guided mastectomy with axillary sentinel lymph node biopsy Right 11/28/2014    Procedure: RADIOACTIVE SEED GUIDED RIGHT LUMPECTOMY WITH RIGHT AXILLARY SENTINEL LYMPH NODE BIOPSY;  Surgeon: Rolm Bookbinder, MD;  Location: [place];  Service: General;  Laterality: Right;  . Re-excision of breast cancer,superior margins Right 01/01/2015    Procedure: RIGHT BREAST MARGIN EXCISION;   Surgeon: Rolm Bookbinder, MD;  Location: [place];  Service: General;  Laterality: Right;  . Portacath placement Left 01/01/2015    Procedure: INSERTION PORT-A-CATH;  Surgeon: Rolm Bookbinder, MD;  Location: [place];  Service: General;  Laterality: Left;    There were no vitals filed for this visit.  Visit Diagnosis:  Lymphedema of upper extremity following lymphadenectomy      Subjective Assessment - 05/18/15 0933    Subjective Nothing's changed.  Took the bandages off yesterday because they felt too tight and uncomfortable at upper arm.   Currently in Pain? Yes   Pain Score 3    Pain Location Arm   Pain Orientation Left;Upper;Medial   Pain Descriptors / Indicators Sore;Pressure   Aggravating Factors  possibly too tight bandages            [facility] PT Assessment - 05/18/15 0001    Observation/Other Assessments   Skin Integrity left upper medial arm just proximal to elbow has an area of increased fluid and induration today   Quick DASH  52              Quick Dash - 05/18/15 0001    Open a tight or new jar Moderate difficulty   Do heavy household chores (wash walls, wash floors) Unable   Carry a shopping bag or briefcase  Moderate difficulty   Wash your back Moderate difficulty   Use a knife to cut food Moderate difficulty   Recreational activities in which you take some force or impact through your arm, shoulder, or hand (golf, hammering, tennis) Unable   During the past week, to what extent has your arm, shoulder or hand problem interfered with your normal social activities with family, friends, neighbors, or groups? Modererately   During the past week, to what extent has your arm, shoulder or hand problem limited your work or other regular daily activities Modererately   Arm, shoulder, or hand pain. Mild   Tingling (pins and needles) in your arm, shoulder, or hand Mild   Difficulty Sleeping Mild difficulty   DASH Score 52.27 %               [facility] Adult PT  Treatment/Exercise - May 26, 2015 0001    Manual Therapy   Manual Lymphatic Drainage (MLD) In supine, short neck, superficial and deep abdomen, left groin and axillo-inguinal anastomosis, and left UE from fingers to shoulder.  Extra time spent on medial upper arm today.   Compression Bandaging For left arm:  Lotion applied; peach small dot foam piece applied to distal medial upper arm to reduce induration; bandaging with TG soft, Elastomull fingers 2-5 fingers, Artiflex, and 3 short stretch bandages from hand to shoulder                        Long Term Clinic Goals - 26-May-2015 1300    CC Long Term Goal  #3   Status On-going   CC Long Term Goal  #4   Baseline 52 today, May 26, 2015   Status On-going            Plan - 05/26/2015 1259    Clinical Impression Statement New pocket of fluid at medial distal upper arm on left today so extra effort spent there.  Pt. noted benefit after manual lymph drainage.   Pt will benefit from skilled therapeutic intervention in order to improve on the following deficits Increased edema   Rehab Potential Excellent   PT Frequency 3x / week   PT Duration 2 weeks   PT Treatment/Interventions Manual lymph drainage;Compression bandaging   PT Next Visit Plan Will need renewal next visit.  Continue complete decongestive therapy.   Recommended Other Services Pt. waiting to hear from garment vendor.   Consulted and Agree with Plan of Care Patient          G-Codes - 26-May-2015 1301    Functional Assessment Tool Used quick DASH   Functional Limitation Carrying, moving and handling objects   Carrying, Moving and Handling Objects Current Status [phone]) At least 40 percent but less than 60 percent impaired, limited or restricted   Carrying, Moving and Handling Objects Goal Status ([id number]) At least 20 percent but less than 40 percent impaired, limited or restricted      Problem List Patient Active Problem List   Diagnosis Date Noted  . Lymphedema of arm  04/12/2015  . Family history of breast cancer   . 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 breast 11/17/2014  . Hypothyroidism 10/12/2007  . Allergic rhinitis 10/12/2007  . BREAST CANCER, HX OF 10/12/2007  . DIVERTICULITIS, HX OF 10/12/2007  . OSTEOARTHRITIS 09/09/2007    SALISBURY,DONNA 2015-05-26, 1:03 PM  [place], [place], [address] Phone: [phone]   Fax:  [place],  PT 05/18/2015 1:03 PM

## 2015-05-21 ENCOUNTER — Ambulatory Visit
Admission: RE | Admit: 2015-05-21 | Discharge: 2015-05-21 | Disposition: A | Discharge status: Home / Self Care | Payer: Medicare Other | Source: Ambulatory Visit | Attending: Radiation Oncology | Admitting: Radiation Oncology

## 2015-05-21 ENCOUNTER — Ambulatory Visit: Payer: Medicare Other | Admitting: Physical Therapy

## 2015-05-21 ENCOUNTER — Encounter: Payer: Self-pay | Admitting: Physical Therapy

## 2015-05-21 ENCOUNTER — Ambulatory Visit: Payer: Medicare Other

## 2015-05-21 DIAGNOSIS — E8989 Other postprocedural endocrine and metabolic complications and disorders: Secondary | ICD-10-CM

## 2015-05-21 DIAGNOSIS — M549 Dorsalgia, unspecified: Secondary | ICD-10-CM | POA: Diagnosis not present

## 2015-05-21 DIAGNOSIS — I890 Lymphedema, not elsewhere classified: Principal | ICD-10-CM

## 2015-05-21 DIAGNOSIS — L599 Disorder of the skin and subcutaneous tissue related to radiation, unspecified: Secondary | ICD-10-CM | POA: Diagnosis not present

## 2015-05-21 DIAGNOSIS — G8929 Other chronic pain: Secondary | ICD-10-CM | POA: Diagnosis not present

## 2015-05-21 DIAGNOSIS — C50211 Malignant neoplasm of upper-inner quadrant of right female breast: Secondary | ICD-10-CM | POA: Diagnosis not present

## 2015-05-21 DIAGNOSIS — M7989 Other specified soft tissue disorders: Secondary | ICD-10-CM | POA: Diagnosis not present

## 2015-05-21 DIAGNOSIS — M25611 Stiffness of right shoulder, not elsewhere classified: Secondary | ICD-10-CM | POA: Diagnosis not present

## 2015-05-21 DIAGNOSIS — Z510 Encounter for antineoplastic radiation therapy: Secondary | ICD-10-CM | POA: Diagnosis not present

## 2015-05-21 NOTE — Therapy (Signed)
[place], [place], [address] Phone: [phone]   Fax:  [phone]  Physical Therapy Treatment  Patient Details  Name: Shawna Marquez MRN: [id number] Date of Birth: [birth year] Referring Provider:  Chauncey Cruel, MD  Encounter Date: 05/21/2015      PT End of Session - 05/21/15 1507    Visit Number 11   Number of Visits 13   Date for PT Re-Evaluation 06/18/15   PT Start Time 1430   PT Stop Time 1509   PT Time Calculation (min) 39 min   Activity Tolerance Patient tolerated treatment well   Behavior During Therapy [facility] for tasks assessed/performed      Past Medical History  Diagnosis Date  . ALLERGIC RHINITIS 10/12/2007  . BREAST CANCER, HX OF 10/12/2007    right breast diagnosed - 11/2014  . DIVERTICULITIS, HX OF 10/12/2007  . HYPOTHYROIDISM, PRIMARY 10/12/2007  . 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 Wears glasses   . Family history of breast cancer   . Complication of anesthesia     bp will drop,hard to wake up  . Sleep apnea     USES cpap NIGHTLY  . OSTEOARTHRITIS 09/09/2007    back     Past Surgical History  Procedure Laterality Date  . Colovaginal fistula  2007    colon resection  . Abdominal hysterectomy  1977  . Hernia repair  2007    ventral  . Colon surgery  2006    part colectomy  . Mastectomy  1997    lt mast-axillary node dissection  . Tonsillectomy    . Colonoscopy  2007  . Wisdom tooth extraction    . Eye surgery      both cataracts  . Radioactive seed guided mastectomy with axillary sentinel lymph node biopsy Right 11/28/2014    Procedure: RADIOACTIVE SEED GUIDED RIGHT LUMPECTOMY WITH RIGHT AXILLARY SENTINEL LYMPH NODE BIOPSY;  Surgeon: Rolm Bookbinder, MD;  Location: [place];  Service: General;  Laterality: Right;  . Re-excision of breast cancer,superior margins Right 01/01/2015    Procedure: RIGHT BREAST MARGIN EXCISION;   Surgeon: Rolm Bookbinder, MD;  Location: [place];  Service: General;  Laterality: Right;  . Portacath placement Left 01/01/2015    Procedure: INSERTION PORT-A-CATH;  Surgeon: Rolm Bookbinder, MD;  Location: [place];  Service: General;  Laterality: Left;    There were no vitals filed for this visit.  Visit Diagnosis:  Lymphedema of upper extremity following lymphadenectomy      Subjective Assessment - 05/21/15 1433    Subjective Took bandages off an hour and a half ago and got to shower.  It was great!  My arm looks really great.  I get measured Friday for my sleeve and glove.   Pertinent History Right breast cancer diagnosed December 2015 with lumpectomy 11/28/14 with margins not clear; 01/01/15 had second lumpectomy and portacath placement.  Had chemo and is now undergoing radiation til the end of June .  Will also take Arimidex.  Two lymph nodes removed, one positive.  h/o left breast cancer with mastectomy 1997 with 16 lymph nodes  removed.  Patient Stated Goals to get rid of swelling   Currently in Pain? No/denies                         [facility] Adult PT Treatment/Exercise - 05/21/15 0001    Manual Therapy   Manual Lymphatic Drainage (MLD) In supine, short neck, superficial and deep abdomen, left groin and axillo-inguinal anastomosis, and left UE from fingers to shoulder.  Extra time spent on medial upper arm today.   Compression Bandaging Lotion applied; peach small dot foam applied to left proximal forearm and a seperate small piece applied to distal medial upper arm to reduce induration; bandaging with TG soft, Elastomull fingers 2-5 fingers, Artiflex, and 3 short stretch bandages from hand to shoulder                        Long Term Clinic Goals - 05/21/15 1511    CC Long Term Goal  #1   Title Patient will have a circumferential reduction of    1.5       cm at   10   cm above the         ulnar  styloid    Time 4   Period Weeks   Status Achieved   CC Long Term Goal  #2   Title Patient will have a circumferential reduction of      2   cm at          10 cm above the             olecranon process   Time 4   Period Weeks   Status Achieved   CC Long Term Goal  #3   Title Patient will be know how to obtain and use compression garments for maintenance phase of treatment   Time 4   Period Weeks   Status On-going   CC Long Term Goal  #4   Title Patient will decrease the DASH score to < 50   to demonstrate increased functional use of upper extremity   Time 4   Period Weeks   Status On-going            Plan - 05/21/15 1509    Clinical Impression Statement Patient's left arm looks fantastic!  Her swelling has reduced significantly and she is very pleased with her progress.  She continues to have mild swelling present and a pocket of fluid on her medial elbow area but that has also improved.  She plans to be fitted for her compression garments 05/25/15 and will be ready for discharge from therapy after those arrive.  Recertification complted today.   Pt will benefit from skilled therapeutic intervention in order to improve on the following deficits Increased edema   Rehab Potential Excellent   Clinical Impairments Affecting Rehab Potential Concurrent radiation to right upper quadrant.     PT Frequency 3x / week   PT Duration 2 weeks   PT Treatment/Interventions Manual lymph drainage;Compression bandaging   PT Next Visit Plan Remeasure left UE. Continue complete decongestive therapy.   Consulted and Agree with Plan of Care Patient        Problem List Patient Active Problem List   Diagnosis Date Noted  . Lymphedema of arm 04/12/2015  . Family history of breast cancer   . 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 breast 11/17/2014  . Hypothyroidism 10/12/2007  . Allergic rhinitis 10/12/2007  .  BREAST CANCER, HX OF 10/12/2007  . DIVERTICULITIS, HX OF 10/12/2007   . OSTEOARTHRITIS 09/09/2007    Annia Friendly, PT 05/21/2015, 3:17 PM  [place], [place], [address] Phone: [phone]   Fax:  [phone]

## 2015-05-22 ENCOUNTER — Ambulatory Visit
Admission: RE | Admit: 2015-05-22 | Discharge: 2015-05-22 | Disposition: A | Discharge status: Home / Self Care | Payer: Medicare Other | Source: Ambulatory Visit | Attending: Radiation Oncology | Admitting: Radiation Oncology

## 2015-05-22 ENCOUNTER — Ambulatory Visit: Payer: Medicare Other

## 2015-05-22 VITALS — BP 122/70 | HR 81 | Temp 98.4°F | Wt 174.3 lb

## 2015-05-22 DIAGNOSIS — Z510 Encounter for antineoplastic radiation therapy: Secondary | ICD-10-CM | POA: Diagnosis not present

## 2015-05-22 DIAGNOSIS — C50211 Malignant neoplasm of upper-inner quadrant of right female breast: Secondary | ICD-10-CM | POA: Diagnosis not present

## 2015-05-22 DIAGNOSIS — L599 Disorder of the skin and subcutaneous tissue related to radiation, unspecified: Secondary | ICD-10-CM | POA: Diagnosis not present

## 2015-05-22 DIAGNOSIS — M7989 Other specified soft tissue disorders: Secondary | ICD-10-CM | POA: Diagnosis not present

## 2015-05-22 NOTE — Progress Notes (Addendum)
Weekly Management Note Current Dose:  55 Gy  Projected Dose: 61 Gy   Narrative:  The patient presents for routine under treatment assessment.  CBCT/MVCT images/Port film x-rays were reviewed.  The chart was checked. Doing well. Left arm wrapped still. Right breast sore  Physical Findings: Weight: 174 lb 4.8 oz (79.062 kg). Unchanged. Pink right breast  Impression:  The patient is tolerating radiation.  Plan:  Continue treatment as planned. Follow up in 1 month. Discussed post RT skin care. F/u with Dr. Lindi Adie on Thursday.

## 2015-05-22 NOTE — Progress Notes (Signed)
Weekly assessment of radiation to right breast.Moderated discoloration.denies pain just nipple soreness.Continue application of radiaplex twice daily.

## 2015-05-23 ENCOUNTER — Ambulatory Visit: Payer: Medicare Other

## 2015-05-23 ENCOUNTER — Ambulatory Visit
Admission: RE | Admit: 2015-05-23 | Discharge: 2015-05-23 | Disposition: A | Discharge status: Home / Self Care | Payer: Medicare Other | Source: Ambulatory Visit | Attending: Radiation Oncology | Admitting: Radiation Oncology

## 2015-05-23 DIAGNOSIS — I890 Lymphedema, not elsewhere classified: Principal | ICD-10-CM

## 2015-05-23 DIAGNOSIS — E8989 Other postprocedural endocrine and metabolic complications and disorders: Secondary | ICD-10-CM

## 2015-05-23 DIAGNOSIS — G8929 Other chronic pain: Secondary | ICD-10-CM

## 2015-05-23 DIAGNOSIS — L599 Disorder of the skin and subcutaneous tissue related to radiation, unspecified: Secondary | ICD-10-CM | POA: Diagnosis not present

## 2015-05-23 DIAGNOSIS — M549 Dorsalgia, unspecified: Secondary | ICD-10-CM | POA: Diagnosis not present

## 2015-05-23 DIAGNOSIS — M25611 Stiffness of right shoulder, not elsewhere classified: Secondary | ICD-10-CM | POA: Diagnosis not present

## 2015-05-23 DIAGNOSIS — Z510 Encounter for antineoplastic radiation therapy: Secondary | ICD-10-CM | POA: Diagnosis not present

## 2015-05-23 DIAGNOSIS — M7989 Other specified soft tissue disorders: Secondary | ICD-10-CM | POA: Diagnosis not present

## 2015-05-23 DIAGNOSIS — C50211 Malignant neoplasm of upper-inner quadrant of right female breast: Secondary | ICD-10-CM | POA: Diagnosis not present

## 2015-05-23 NOTE — Therapy (Signed)
[place], [place], [address] Phone: [phone]   Fax:  [phone]  Physical Therapy Treatment  Patient Details  Name: Shawna Marquez MRN: [id number] Date of Birth: 04-07-1941 Referring Provider:  Chauncey Cruel, MD  Encounter Date: 05/23/2015      PT End of Session - 05/23/15 1015    Visit Number 12   Number of Visits 13   Date for PT Re-Evaluation 06/18/15   PT Start Time 0934   PT Stop Time 1015   PT Time Calculation (min) 41 min   Activity Tolerance Patient tolerated treatment well   Behavior During Therapy [facility] for tasks assessed/performed      Past Medical History  Diagnosis Date  . ALLERGIC RHINITIS 10/12/2007  . BREAST CANCER, HX OF 10/12/2007    right breast diagnosed - 11/2014  . DIVERTICULITIS, HX OF 10/12/2007  . HYPOTHYROIDISM, PRIMARY 10/12/2007  . 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 Wears glasses   . Family history of breast cancer   . Complication of anesthesia     bp will drop,hard to wake up  . Sleep apnea     USES cpap NIGHTLY  . OSTEOARTHRITIS 09/09/2007    back     Past Surgical History  Procedure Laterality Date  . Colovaginal fistula  2007    colon resection  . Abdominal hysterectomy  1977  . Hernia repair  2007    ventral  . Colon surgery  2006    part colectomy  . Mastectomy  1997    lt mast-axillary node dissection  . Tonsillectomy    . Colonoscopy  2007  . Wisdom tooth extraction    . Eye surgery      both cataracts  . Radioactive seed guided mastectomy with axillary sentinel lymph node biopsy Right 11/28/2014    Procedure: RADIOACTIVE SEED GUIDED RIGHT LUMPECTOMY WITH RIGHT AXILLARY SENTINEL LYMPH NODE BIOPSY;  Surgeon: Rolm Bookbinder, MD;  Location: [place];  Service: General;  Laterality: Right;  . Re-excision of breast cancer,superior margins Right 01/01/2015    Procedure: RIGHT BREAST MARGIN EXCISION;   Surgeon: Rolm Bookbinder, MD;  Location: [place];  Service: General;  Laterality: Right;  . Portacath placement Left 01/01/2015    Procedure: INSERTION PORT-A-CATH;  Surgeon: Rolm Bookbinder, MD;  Location: [place];  Service: General;  Laterality: Left;    There were no vitals filed for this visit.  Visit Diagnosis:  Lymphedema of upper extremity following lymphadenectomy  Back pain, chronic  Stiffness of joint, shoulder region, right      Subjective Assessment - 05/23/15 0937    Subjective Doing good, got to shower last night! Get measured for my sleeve Friday.    Currently in Pain? No/denies               LYMPHEDEMA/ONCOLOGY QUESTIONNAIRE - 05/23/15 0938    Left Upper Extremity Lymphedema   10 cm Proximal to Olecranon Process 31.1 cm   Olecranon Process 27.7 cm   10 cm Proximal to Ulnar Styloid Process 24.9 cm   Just Proximal to Ulnar Styloid Process 16.8 cm   Across Hand at [employer] 19 cm   At [place] of 2nd Digit 6.4 cm                  [facility] Adult PT Treatment/Exercise - 05/23/15 0001    Manual Therapy   Manual Lymphatic Drainage (MLD) In supine, short neck, superficial  and deep abdomen, left groin and axillo-inguinal anastomosis, and left UE from fingers to shoulder.  Extra time spent on medial upper arm today.   Compression Bandaging Lotion applied; peach small dot foam applied to applied to distal medial upper arm; bandaging with TG soft, Elastomull fingers 2-5 fingers, Artiflex, and 3 short stretch bandages from hand to shoulder                        [place] - 05/23/15 1018    CC Long Term Goal  #3   Title Patient will be know how to obtain and use compression garments for maintenance phase of treatment   Status Partially Met   CC Long Term Goal  #4   Title Patient will decrease the DASH score to < 50   to demonstrate increased functional use of upper extremity   Status On-going            Plan - 05/23/15  1016    Clinical Impression Statement Pt had slight increases today with measurements however, bandages have been off for >12 hours as she removed them last night to shower. Skin looked good, no irritations. Pt will benenfit from continuing therapy until her garments arrive to keep her reductions maintained.   Pt will benefit from skilled therapeutic intervention in order to improve on the following deficits Increased edema   Rehab Potential Excellent   Clinical Impairments Affecting Rehab Potential Concurrent radiation to right upper quadrant.     PT Frequency 3x / week   PT Duration 2 weeks   PT Treatment/Interventions Manual lymph drainage;Compression bandaging   PT Next Visit Plan Continue complete decongestive therapy.   Recommended Other Services Pt gets measured Friday, 05/25/15.   Consulted and Agree with Plan of Care Patient        Problem List Patient Active Problem List   Diagnosis Date Noted  . Lymphedema of arm 04/12/2015  . Family history of breast cancer   . 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 breast 11/17/2014  . Hypothyroidism 10/12/2007  . Allergic rhinitis 10/12/2007  . BREAST CANCER, HX OF 10/12/2007  . DIVERTICULITIS, HX OF 10/12/2007  . OSTEOARTHRITIS 09/09/2007    Otelia Limes, PTA 05/23/2015, 10:18 AM  [place], [place], [address] Phone: [phone]   Fax:  [phone]

## 2015-05-24 ENCOUNTER — Ambulatory Visit
Admission: RE | Admit: 2015-05-24 | Discharge: 2015-05-24 | Disposition: A | Discharge status: Home / Self Care | Payer: Medicare Other | Source: Ambulatory Visit | Attending: Radiation Oncology | Admitting: Radiation Oncology

## 2015-05-24 ENCOUNTER — Ambulatory Visit: Payer: Medicare Other

## 2015-05-24 DIAGNOSIS — Z510 Encounter for antineoplastic radiation therapy: Secondary | ICD-10-CM | POA: Diagnosis not present

## 2015-05-24 DIAGNOSIS — C50211 Malignant neoplasm of upper-inner quadrant of right female breast: Secondary | ICD-10-CM | POA: Diagnosis not present

## 2015-05-24 DIAGNOSIS — M7989 Other specified soft tissue disorders: Secondary | ICD-10-CM | POA: Diagnosis not present

## 2015-05-24 DIAGNOSIS — L599 Disorder of the skin and subcutaneous tissue related to radiation, unspecified: Secondary | ICD-10-CM | POA: Diagnosis not present

## 2015-05-25 ENCOUNTER — Ambulatory Visit: Payer: Medicare Other

## 2015-05-25 ENCOUNTER — Ambulatory Visit: Payer: Medicare Other | Admitting: Physical Therapy

## 2015-05-25 ENCOUNTER — Encounter: Payer: Self-pay | Admitting: Radiation Oncology

## 2015-05-25 ENCOUNTER — Ambulatory Visit
Admission: RE | Admit: 2015-05-25 | Discharge: 2015-05-25 | Disposition: A | Discharge status: Home / Self Care | Payer: Medicare Other | Source: Ambulatory Visit | Attending: Radiation Oncology | Admitting: Radiation Oncology

## 2015-05-25 DIAGNOSIS — I890 Lymphedema, not elsewhere classified: Secondary | ICD-10-CM | POA: Diagnosis not present

## 2015-05-25 DIAGNOSIS — E8989 Other postprocedural endocrine and metabolic complications and disorders: Secondary | ICD-10-CM

## 2015-05-25 DIAGNOSIS — Z510 Encounter for antineoplastic radiation therapy: Secondary | ICD-10-CM | POA: Diagnosis not present

## 2015-05-25 DIAGNOSIS — C50211 Malignant neoplasm of upper-inner quadrant of right female breast: Secondary | ICD-10-CM | POA: Diagnosis not present

## 2015-05-25 DIAGNOSIS — G8929 Other chronic pain: Secondary | ICD-10-CM | POA: Diagnosis not present

## 2015-05-25 DIAGNOSIS — M25611 Stiffness of right shoulder, not elsewhere classified: Secondary | ICD-10-CM | POA: Diagnosis not present

## 2015-05-25 DIAGNOSIS — L599 Disorder of the skin and subcutaneous tissue related to radiation, unspecified: Secondary | ICD-10-CM | POA: Diagnosis not present

## 2015-05-25 DIAGNOSIS — M7989 Other specified soft tissue disorders: Secondary | ICD-10-CM | POA: Diagnosis not present

## 2015-05-25 DIAGNOSIS — M549 Dorsalgia, unspecified: Secondary | ICD-10-CM | POA: Diagnosis not present

## 2015-05-25 NOTE — Addendum Note (Signed)
Addended by: Kipp Laurence on: 05/25/2015 12:19 PM   Modules accepted: Orders

## 2015-05-25 NOTE — Therapy (Signed)
[place], [place], [address] Phone: [phone]   Fax:  [phone]  Physical Therapy Treatment  Patient Details  Name: Shawna Marquez MRN: [id number] Date of Birth: 09/26/1941 Referring Provider:  Chauncey Cruel, MD  Encounter Date: 05/25/2015      PT End of Session - 05/25/15 1151    Visit Number 13   Number of Visits 19   PT Start Time 0935   PT Stop Time 1015   PT Time Calculation (min) 40 min      Past Medical History  Diagnosis Date  . ALLERGIC RHINITIS 10/12/2007  . BREAST CANCER, HX OF 10/12/2007    right breast diagnosed - 11/2014  . DIVERTICULITIS, HX OF 10/12/2007  . HYPOTHYROIDISM, PRIMARY 10/12/2007  . 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 Wears glasses   . Family history of breast cancer   . Complication of anesthesia     bp will drop,hard to wake up  . Sleep apnea     USES cpap NIGHTLY  . OSTEOARTHRITIS 09/09/2007    back     Past Surgical History  Procedure Laterality Date  . Colovaginal fistula  2007    colon resection  . Abdominal hysterectomy  1977  . Hernia repair  2007    ventral  . Colon surgery  2006    part colectomy  . Mastectomy  1997    lt mast-axillary node dissection  . Tonsillectomy    . Colonoscopy  2007  . Wisdom tooth extraction    . Eye surgery      both cataracts  . Radioactive seed guided mastectomy with axillary sentinel lymph node biopsy Right 11/28/2014    Procedure: RADIOACTIVE SEED GUIDED RIGHT LUMPECTOMY WITH RIGHT AXILLARY SENTINEL LYMPH NODE BIOPSY;  Surgeon: Rolm Bookbinder, MD;  Location: [place];  Service: General;  Laterality: Right;  . Re-excision of breast cancer,superior margins Right 01/01/2015    Procedure: RIGHT BREAST MARGIN EXCISION;  Surgeon: Rolm Bookbinder, MD;  Location: [place];  Service: General;  Laterality: Right;  . Portacath placement Left 01/01/2015    Procedure: INSERTION  PORT-A-CATH;  Surgeon: Rolm Bookbinder, MD;  Location: [place];  Service: General;  Laterality: Left;    There were no vitals filed for this visit.  Visit Diagnosis:  Lymphedema of upper extremity following lymphadenectomy      Subjective Assessment - 05/25/15 1152    Subjective last radiation treatment is today. Pt got measured for sleeve and  gaunlet   today    Pertinent History Right breast cancer diagnosed December 2015 with lumpectomy 11/28/14 with margins not clear; 01/01/15 had second lumpectomy and portacath placement.  Had chemo and is now undergoing radiation til the end of June .  Will also take Arimidex.  Two lymph nodes removed, one positive.  h/o left breast cancer with mastectomy 1997 with 16 lymph nodes  removed.                                                            Currently in Pain? No/denies                         [facility] Adult PT Treatment/Exercise - 05/25/15 0001  Manual Therapy   Manual Lymphatic Drainage (MLD) In supine, short neck, superficial and deep abdomen, left groin and axillo-inguinal anastomosis, and left UE from fingers to shoulder.  Extra time spent forearmtoday.   Compression Bandaging Biotone applied. thick stockinette, elastomull to fingers 2-5, 2 artifelx ,peach small dot foam applied to applied to forearm. 4 short stretch bandages from hand to shoulder with herringbone at forearm                PT Education - 05/25/15 1148    Education provided Yes   Education Details reviewed self manual lymph drainage and issued handout for wrapping for RN daughter to follow if she feels she is able. Also given small tg soft to wear on forearm and medium tg soft to place on top of that to wear when bandages are off   Person(s) Educated Patient   Methods Explanation;Demonstration;Handout   Comprehension Verbalized understanding;Need further instruction                [facility] Goals - 05/25/15 1215    CC Long Term  Goal  #3   Title Patient will be know how to obtain and use compression garments for maintenance phase of treatment   Status Achieved   CC Long Term Goal  #4   Title Patient will decrease the DASH score to < 50   to demonstrate increased functional use of upper extremity   Status On-going            Plan - 05/25/15 1209    Clinical Impression Statement Pt got measured today. She does not have another appt. til next weds so will do self manual lymph drainage, elevation, exercise and use of tg soft (small at forearm, med on full arm) til then.  Daughter is RN so may be able to apply wrap if needed.    PT Next Visit Plan Continue complete decongestive therapy.  ???? use of kinesiotape to forearm congestion??    Consulted and Agree with Plan of Care Patient        Problem List Patient Active Problem List   Diagnosis Date Noted  . Lymphedema of arm 04/12/2015  . Family history of breast cancer   . 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 breast 11/17/2014  . Hypothyroidism 10/12/2007  . Allergic rhinitis 10/12/2007  . BREAST CANCER, HX OF 10/12/2007  . DIVERTICULITIS, HX OF 10/12/2007  . OSTEOARTHRITIS 09/09/2007   Donato Heinz. Owens Shark PT   Norwood Levo 05/25/2015, 12:16 PM  [place], [place], [address] Phone: [phone]   Fax:  [phone]

## 2015-05-30 ENCOUNTER — Ambulatory Visit: Payer: Medicare Other

## 2015-05-30 DIAGNOSIS — I890 Lymphedema, not elsewhere classified: Principal | ICD-10-CM

## 2015-05-30 DIAGNOSIS — E8989 Other postprocedural endocrine and metabolic complications and disorders: Secondary | ICD-10-CM

## 2015-05-30 DIAGNOSIS — M549 Dorsalgia, unspecified: Secondary | ICD-10-CM

## 2015-05-30 DIAGNOSIS — G8929 Other chronic pain: Secondary | ICD-10-CM

## 2015-05-30 DIAGNOSIS — M25611 Stiffness of right shoulder, not elsewhere classified: Secondary | ICD-10-CM | POA: Diagnosis not present

## 2015-05-30 NOTE — Therapy (Signed)
[place], [place], [address] Phone: [phone]   Fax:  [phone]  Physical Therapy Treatment  Patient Details  Name: Shawna Marquez MRN: [id number] Date of Birth: [birth year] Referring Provider:  Chauncey Cruel, MD  Encounter Date: 05/30/2015      PT End of Session - 05/30/15 1012    Visit Number 14   Number of Visits 19   Date for PT Re-Evaluation 06/18/15   PT Start Time 0926   PT Stop Time 1009   PT Time Calculation (min) 43 min   Activity Tolerance Patient tolerated treatment well   Behavior During Therapy [facility] for tasks assessed/performed      Past Medical History  Diagnosis Date  . ALLERGIC RHINITIS 10/12/2007  . BREAST CANCER, HX OF 10/12/2007    right breast diagnosed - 11/2014  . DIVERTICULITIS, HX OF 10/12/2007  . HYPOTHYROIDISM, PRIMARY 10/12/2007  . 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 Wears glasses   . Family history of breast cancer   . Complication of anesthesia     bp will drop,hard to wake up  . Sleep apnea     USES cpap NIGHTLY  . OSTEOARTHRITIS 09/09/2007    back     Past Surgical History  Procedure Laterality Date  . Colovaginal fistula  2007    colon resection  . Abdominal hysterectomy  1977  . Hernia repair  2007    ventral  . Colon surgery  2006    part colectomy  . Mastectomy  1997    lt mast-axillary node dissection  . Tonsillectomy    . Colonoscopy  2007  . Wisdom tooth extraction    . Eye surgery      both cataracts  . Radioactive seed guided mastectomy with axillary sentinel lymph node biopsy Right 11/28/2014    Procedure: RADIOACTIVE SEED GUIDED RIGHT LUMPECTOMY WITH RIGHT AXILLARY SENTINEL LYMPH NODE BIOPSY;  Surgeon: Rolm Bookbinder, MD;  Location: [place];  Service: General;  Laterality: Right;  . Re-excision of breast cancer,superior margins Right 01/01/2015    Procedure: RIGHT BREAST MARGIN EXCISION;   Surgeon: Rolm Bookbinder, MD;  Location: [place];  Service: General;  Laterality: Right;  . Portacath placement Left 01/01/2015    Procedure: INSERTION PORT-A-CATH;  Surgeon: Rolm Bookbinder, MD;  Location: [place];  Service: General;  Laterality: Left;    There were no vitals filed for this visit.  Visit Diagnosis:  Lymphedema of upper extremity following lymphadenectomy  Back pain, chronic  Stiffness of joint, shoulder region, right      Subjective Assessment - 05/30/15 0928    Subjective Finished radiation!!! Took my bandages off last night, my arm looks so good. Got measured Friday for my garments.    Currently in Pain? No/denies               LYMPHEDEMA/ONCOLOGY QUESTIONNAIRE - 05/30/15 0930    Left Upper Extremity Lymphedema   10 cm Proximal to Olecranon Process 30.6 cm   Olecranon Process 27.9 cm   10 cm Proximal to Ulnar Styloid Process 25 cm   Just Proximal to Ulnar Styloid Process 17.7 cm   Across Hand at [employer] 19 cm   At [place] of 2nd Digit 6.7 cm                  [facility] Adult PT Treatment/Exercise - 05/30/15 0001    Manual Therapy   Manual Lymphatic Drainage (  MLD) In supine, short neck, superficial and deep abdomen, left groin and axillo-inguinal anastomosis, and left UE from fingers to shoulder.  Extra time spent forearmtoday.   Compression Bandaging Biotone applied. thick stockinette, elastomull to fingers 2-5, Artiflex x1, peach small dot foam applied to applied to forearm. 3 short stretch compression bandages from hand to axilla.                        [facility] Goals - 05/30/15 1014    CC Long Term Goal  #4   Title Patient will decrease the DASH score to < 50   to demonstrate increased functional use of upper extremity   Status On-going            Plan - 05/30/15 1013    Clinical Impression Statement Pt had slight increase at wrist, but all other measurements maintained or slightly reduced. She was  measaured for her compression garments on Friday and will benefit from continued therapy until they arrive to keep her circumference maintained.    Pt will benefit from skilled therapeutic intervention in order to improve on the following deficits Increased edema   Rehab Potential Excellent   Clinical Impairments Affecting Rehab Potential Concurrent radiation to right upper quadrant.     PT Frequency 3x / week   PT Duration 2 weeks   PT Treatment/Interventions Manual lymph drainage;Compression bandaging   PT Next Visit Plan Continue complete decongestive therapy until garments arrive.  ???? use of kinesiotape to forearm congestion??    Consulted and Agree with Plan of Care Patient        Problem List Patient Active Problem List   Diagnosis Date Noted  . Lymphedema of arm 04/12/2015  . Family history of breast cancer   . 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 breast 11/17/2014  . Hypothyroidism 10/12/2007  . Allergic rhinitis 10/12/2007  . BREAST CANCER, HX OF 10/12/2007  . DIVERTICULITIS, HX OF 10/12/2007  . OSTEOARTHRITIS 09/09/2007    Otelia Limes, PTA 05/30/2015, 10:15 AM  [place], [place], [address] Phone: [phone]   Fax:  [phone]

## 2015-05-30 NOTE — Assessment & Plan Note (Signed)
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, focal vascular involvement, posterior margin involvement, 1/2 sentinel node positive, T1 cN1 M0 stage II a, minimal present luminal type B, high risk, disease free survival with chemotherapy and hormonal therapy 88% versus 76% with hormonal therapy alone  Treatment Plan: Adjuvant chemotherapy with Taxotere and Cytoxan every 3 weeks 4 cycles started 01/15/15 completed 03/19/15, followed by adjuvant radiation therapy started 04/10/15 and followed by antiestrogen therapy with anastrozole 5 years  Anastrozole counseling: We discussed the risks and benefits of anti-estrogen therapy with aromatase inhibitors. These include but not limited to insomnia, hot flashes, mood changes, vaginal dryness, bone density loss, and weight gain. Although rare, serious side effects including endometrial cancer, risk of blood clots were also discussed. We strongly believe that the benefits far outweigh the risks. Patient understands these risks and consented to starting treatment. Planned treatment duration is 5 years.

## 2015-05-31 ENCOUNTER — Ambulatory Visit (HOSPITAL_BASED_OUTPATIENT_CLINIC_OR_DEPARTMENT_OTHER): Payer: Medicare Other

## 2015-05-31 ENCOUNTER — Other Ambulatory Visit (HOSPITAL_BASED_OUTPATIENT_CLINIC_OR_DEPARTMENT_OTHER): Payer: Medicare Other

## 2015-05-31 ENCOUNTER — Encounter: Payer: Self-pay | Admitting: Hematology and Oncology

## 2015-05-31 ENCOUNTER — Telehealth: Payer: Self-pay | Admitting: Hematology and Oncology

## 2015-05-31 ENCOUNTER — Ambulatory Visit (HOSPITAL_BASED_OUTPATIENT_CLINIC_OR_DEPARTMENT_OTHER): Payer: Medicare Other | Admitting: Hematology and Oncology

## 2015-05-31 VITALS — BP 125/67 | HR 79 | Temp 98.1°F | Resp 18 | Ht 61.0 in | Wt 174.7 lb

## 2015-05-31 DIAGNOSIS — R600 Localized edema: Secondary | ICD-10-CM

## 2015-05-31 DIAGNOSIS — Z170 Estrogen receptor positive status [ER+]: Secondary | ICD-10-CM | POA: Diagnosis not present

## 2015-05-31 DIAGNOSIS — Z452 Encounter for adjustment and management of vascular access device: Secondary | ICD-10-CM

## 2015-05-31 DIAGNOSIS — C50211 Malignant neoplasm of upper-inner quadrant of right female breast: Secondary | ICD-10-CM

## 2015-05-31 DIAGNOSIS — Z95828 Presence of other vascular implants and grafts: Secondary | ICD-10-CM

## 2015-05-31 DIAGNOSIS — I890 Lymphedema, not elsewhere classified: Secondary | ICD-10-CM

## 2015-05-31 LAB — COMPREHENSIVE METABOLIC PANEL (CC13)
ALT: 16 U/L (ref 0–55)
ANION GAP: 6 meq/L (ref 3–11)
AST: 22 U/L (ref 5–34)
Albumin: 3.4 g/dL — ABNORMAL LOW (ref 3.5–5.0)
Alkaline Phosphatase: 82 U/L (ref 40–150)
BILIRUBIN TOTAL: 0.31 mg/dL (ref 0.20–1.20)
BUN: 15.4 mg/dL (ref 7.0–26.0)
CHLORIDE: 106 meq/L (ref 98–109)
CO2: 28 meq/L (ref 22–29)
CREATININE: 0.6 mg/dL (ref 0.6–1.1)
Calcium: 8.8 mg/dL (ref 8.4–10.4)
EGFR: 89 mL/min/{1.73_m2} — AB (ref >90)
Glucose: 98 mg/dl (ref 70–140)
Potassium: 4.1 mEq/L (ref 3.5–5.1)
Sodium: 140 mEq/L (ref 136–145)
Total Protein: 6.2 g/dL — ABNORMAL LOW (ref 6.4–8.3)

## 2015-05-31 LAB — CBC WITH DIFFERENTIAL/PLATELET
BASO%: 0.5 % (ref 0.0–2.0)
Basophils Absolute: 0 10*3/uL (ref 0.0–0.1)
EOS%: 7.4 % — AB (ref 0.0–7.0)
Eosinophils Absolute: 0.3 10*3/uL (ref 0.0–0.5)
HEMATOCRIT: 37.2 % (ref 34.8–46.6)
HGB: 12 g/dL (ref 11.6–15.9)
LYMPH%: 14.8 % (ref 14.0–49.7)
MCH: 30.8 pg (ref 25.1–34.0)
MCHC: 32.3 g/dL (ref 31.5–36.0)
MCV: 95.6 fL (ref 79.5–101.0)
MONO#: 0.5 10*3/uL (ref 0.1–0.9)
MONO%: 12.2 % (ref 0.0–14.0)
NEUT#: 2.6 10*3/uL (ref 1.5–6.5)
NEUT%: 65.1 % (ref 38.4–76.8)
PLATELETS: 184 10*3/uL (ref 145–400)
RBC: 3.89 10*6/uL (ref 3.70–5.45)
RDW: 13.1 % (ref 11.2–14.5)
WBC: 3.9 10*3/uL (ref 3.9–10.3)
lymph#: 0.6 10*3/uL — ABNORMAL LOW (ref 0.9–3.3)

## 2015-05-31 MED ORDER — HEPARIN SOD (PORK) LOCK FLUSH 100 UNIT/ML IV SOLN
500.0000 [IU] | Freq: Once | INTRAVENOUS | Status: AC
  Administered 2015-05-31: 500 [IU] via INTRAVENOUS
  Filled 2015-05-31: qty 5

## 2015-05-31 MED ORDER — ANASTROZOLE 1 MG PO TABS: 1.0000 mg | ORAL_TABLET | Freq: Every day | ORAL | Status: DC

## 2015-05-31 MED ORDER — SODIUM CHLORIDE 0.9 % IJ SOLN
10.0000 mL | INTRAMUSCULAR | Status: DC | PRN
  Administered 2015-05-31: 10 mL via INTRAVENOUS
  Filled 2015-05-31: qty 10

## 2015-05-31 NOTE — Progress Notes (Signed)
Patient Care Team: Marletta Lor, MD as PCP - General Rolm Bookbinder, MD as Consulting Physician (General Surgery) Nicholas Lose, MD as Consulting Physician (Hematology and Oncology) Thea Silversmith, MD as Consulting Physician (Radiation Oncology) Holley Bouche, NP as Nurse Practitioner (Nurse Practitioner)  DIAGNOSIS: 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 breast   Staging form: Breast, AJCC 7th Edition     Clinical stage from 11/22/2014: Stage IA (T1b, N0, M0) - Unsigned     Pathologic: Stage IIA (T1c, N1a, cM0) - Signed by Seward Grater, MD on 12/26/2014       Staging comments: Staged on final lumpectomy specimen read by Dr. Saralyn Pilar.    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    Mammogram 9 mm mass at 1:00 position   11/15/2014 Initial Biopsy Right breast needle biopsy: Grade 2 invasive ductal carcinoma with DCIS with lymphovascular invasion ER/PR positive HER-2 negative Ki-67 20%   11/28/2014 Surgery right breast lumpectomy: 1.7 cm invasive ductal carcinoma with DCIS, focal vascular involvement, posterior margin involvement, 1/2 sentinel node positive, Mammaprint Luminal type, High Risk   01/15/2015 - 03/20/2015 Chemotherapy TC X 4 adjuvant chemo   04/09/2015 - 05/25/2015 Radiation Therapy Adjuvant XRT   05/31/2015 -  Anti-estrogen oral therapy Anastrozole 1 mg daily    CHIEF COMPLIANT: Leg swelling and left arm lymphedema  INTERVAL HISTORY: Shawna Marquez is a 74 year old with above-mentioned history of recurrent breast cancer initially in the left breast and subsequently in the right breast treated with lumpectomy and adjuvant chemotherapy and adjuvant radiation. She is completed radiation therapy and is here today to start antiestrogen therapy. She complains of swelling of her legs right more than  the left which has been going on for the past 2 months. Also has lymphedema in the left arm for which she sees physical therapy indeed she is getting a fitted lymphedema sleeve.  REVIEW OF SYSTEMS:   Constitutional: Denies fevers, chills or abnormal weight loss Eyes: Denies blurriness of vision Ears, nose, mouth, throat, and face: Denies mucositis or sore throat Respiratory: Denies cough, dyspnea or wheezes Cardiovascular: Denies palpitation, chest discomfort C/O lower extremity swelling Gastrointestinal:  Denies nausea, heartburn or change in bowel habits Skin: Denies abnormal skin rashes Lymphatics: Left arm lymphedema  Neurological:Denies numbness, tingling or new weaknesses Behavioral/Psych: Mood is stable, no new changes  Breast:  denies any pain or lumps or nodules in either breasts All other systems were reviewed with the patient and are negative.  I have reviewed the past medical history, past surgical history, social history and family history with the patient and they are unchanged from previous note.  ALLERGIES:  has No Known Allergies.  MEDICATIONS:  Current Outpatient Prescriptions  Medication Sig Dispense Refill  . aspirin EC 81 MG tablet Take 81 mg by mouth at bedtime.    . Calcium Citrate-Vitamin D (CALCIUM CITRATE + D3 PO) Take 1 tablet by mouth 2 (two) times daily. Calcium 500 mg, Vitamin D 800 units    . cholecalciferol (VITAMIN D) 1000 UNITS tablet Take 1,000 Units by mouth daily with lunch.     . Coenzyme Q10 (COQ10) 200 MG CAPS Take 200 mg by mouth every other day.    Marland Kitchen dexamethasone (DECADRON) 4 MG tablet Take 1 tablet (4 mg total) by mouth 2 (two) times daily. Start the  day before Taxotere. Then again the day after chemo for 3 days. 30 tablet 1  . fluticasone (FLONASE) 50 MCG/ACT nasal spray INSTILL 1 SPRAY INTO EACH NOSTRIL TWICE DAILY 48 g 3  . folic acid (FOLVITE) 400 MCG tablet Take 400 mcg by mouth daily with lunch.     . hydrocortisone cream 1 % Apply 1  application topically daily as needed for itching (eczema).    Marland Kitchen levothyroxine (SYNTHROID, LEVOTHROID) 125 MCG tablet Take 1 tablet (125 mcg total) by mouth daily before breakfast. 90 tablet 3  . lidocaine-prilocaine (EMLA) cream Apply to affected area once 30 g 3  . LORazepam (ATIVAN) 0.5 MG tablet Take 1 tablet (0.5 mg total) by mouth every 6 (six) hours as needed (Nausea or vomiting). 30 tablet 0  . Multiple Vitamin (MULTIVITAMIN WITH MINERALS) TABS tablet Take 1 tablet by mouth daily with lunch.    . non-metallic deodorant Thornton Papas) MISC Apply 1 application topically daily as needed.    . Omega-3 Fatty Acids (FISH OIL) 1000 MG CAPS Take 1,000 mg by mouth daily with lunch.    . ondansetron (ZOFRAN) 8 MG tablet Take 1 tablet (8 mg total) by mouth 2 (two) times daily. Start the day after chemo for 3 days. Then take as needed for nausea or vomiting. 30 tablet 1  . oxyCODONE-acetaminophen (PERCOCET) 10-325 MG per tablet Take 1 tablet by mouth every 6 (six) hours as needed for pain. 20 tablet 0  . oxyCODONE-acetaminophen (PERCOCET) 10-325 MG per tablet Take 1 tablet by mouth every 6 (six) hours as needed for pain. 20 tablet 0  . prochlorperazine (COMPAZINE) 10 MG tablet Take 1 tablet (10 mg total) by mouth every 6 (six) hours as needed (Nausea or vomiting). 30 tablet 1  . Turmeric 450 MG CAPS Take 450 mg by mouth daily with lunch.     . vitamin C (ASCORBIC ACID) 500 MG tablet Take 500 mg by mouth 2 (two) times daily.    . Wound Cleansers (RADIAPLEX EX) Apply topically.    Marland Kitchen anastrozole (ARIMIDEX) 1 MG tablet Take 1 tablet (1 mg total) by mouth daily. 90 tablet 3   No current facility-administered medications for this visit.    PHYSICAL EXAMINATION: ECOG PERFORMANCE STATUS: 1 - Symptomatic but completely ambulatory  Filed Vitals:   05/31/15 0904  BP: 125/67  Pulse: 79  Temp: 98.1 F (36.7 C)  Resp: 18   Filed Weights   05/31/15 0904  Weight: 174 lb 11.2 oz (79.243 kg)    GENERAL:alert,  no distress and comfortable SKIN: skin color, texture, turgor are normal, no rashes or significant lesions EYES: normal, Conjunctiva are pink and non-injected, sclera clear OROPHARYNX:no exudate, no erythema and lips, buccal mucosa, and tongue normal  NECK: supple, thyroid normal size, non-tender, without nodularity LYMPH:  no palpable lymphadenopathy in the cervical, axillary or inguinal LUNGS: clear to auscultation and percussion with normal breathing effort HEART: regular rate & rhythm and no murmurs and 1+ lower extremity edema ABDOMEN:abdomen soft, non-tender and normal bowel sounds Musculoskeletal:no cyanosis of digits and no clubbing  NEURO: alert & oriented x 3 with fluent speech, no focal motor/sensory deficits  LABORATORY DATA:  I have reviewed the data as listed   Chemistry      Component Value Date/Time   NA 140 05/31/2015 0830   NA 142 12/28/2014 1533   K 4.1 05/31/2015 0830   K 4.1 12/28/2014 1533   CL 104 12/28/2014 1533   CO2 28 05/31/2015 0830  CO2 30 12/28/2014 1533   BUN 15.4 05/31/2015 0830   BUN 11 12/28/2014 1533   CREATININE 0.6 05/31/2015 0830   CREATININE 0.76 12/28/2014 1533      Component Value Date/Time   CALCIUM 8.8 05/31/2015 0830   CALCIUM 9.3 12/28/2014 1533   ALKPHOS 82 05/31/2015 0830   ALKPHOS 67 02/24/2014 1013   AST 22 05/31/2015 0830   AST 33 02/24/2014 1013   ALT 16 05/31/2015 0830   ALT 31 02/24/2014 1013   BILITOT 0.31 05/31/2015 0830   BILITOT 0.7 02/24/2014 1013       Lab Results  Component Value Date   WBC 3.9 05/31/2015   HGB 12.0 05/31/2015   HCT 37.2 05/31/2015   MCV 95.6 05/31/2015   PLT 184 05/31/2015   NEUTROABS 2.6 05/31/2015   ASSESSMENT & PLAN:  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 breast 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, focal vascular involvement, posterior margin involvement, 1/2 sentinel node positive, T1 cN1 M0 stage II a, minimal present luminal type B, high  risk, disease free survival with chemotherapy and hormonal therapy 88% versus 76% with hormonal therapy alone  Treatment Plan: Adjuvant chemotherapy with Taxotere and Cytoxan every 3 weeks 4 cycles started 01/15/15 completed 03/19/15, followed by adjuvant radiation therapy started 04/10/15 and followed by antiestrogen therapy with anastrozole 5 years  Anastrozole counseling: We discussed the risks and benefits of anti-estrogen therapy with aromatase inhibitors. These include but not limited to insomnia, hot flashes, mood changes, vaginal dryness, bone density loss, and weight gain. Although rare, serious side effects including endometrial cancer, risk of blood clots were also discussed. We strongly believe that the benefits far outweigh the risks. Patient understands these risks and consented to starting treatment. Planned treatment duration is 5 years.  Left arm lymphedema: Getting a lymphedema sleeve Bilateral lower extremity edema: I suspect it is related to hypoalbuminemia. Her albumin level was 2.9 in April 2016. It has improved to 3.4 today. I discussed with her about increasing her protein intake in the diet. I did not prescribe Lasix at this time. I anticipate that as her energy levels improved and her physical activity improves and her nutrition, the leg swelling will go away.  No orders of the defined types were placed in this encounter.   The patient has a good understanding of the overall plan. she agrees with it. she will call with any problems that may develop before the next visit here.   Rulon Eisenmenger, MD

## 2015-05-31 NOTE — Patient Instructions (Signed)

## 2015-05-31 NOTE — Telephone Encounter (Signed)
Gave avs & calendar for September °

## 2015-06-01 ENCOUNTER — Ambulatory Visit: Payer: Medicare Other | Admitting: Physical Therapy

## 2015-06-01 DIAGNOSIS — M549 Dorsalgia, unspecified: Secondary | ICD-10-CM | POA: Diagnosis not present

## 2015-06-01 DIAGNOSIS — I890 Lymphedema, not elsewhere classified: Principal | ICD-10-CM

## 2015-06-01 DIAGNOSIS — E8989 Other postprocedural endocrine and metabolic complications and disorders: Secondary | ICD-10-CM

## 2015-06-01 DIAGNOSIS — G8929 Other chronic pain: Secondary | ICD-10-CM | POA: Diagnosis not present

## 2015-06-01 DIAGNOSIS — M25611 Stiffness of right shoulder, not elsewhere classified: Secondary | ICD-10-CM | POA: Diagnosis not present

## 2015-06-01 NOTE — Patient Instructions (Signed)
Try to keep bandages on until Monday, then rewrap arm until Wednesday appt.

## 2015-06-01 NOTE — Therapy (Signed)
[place], [place], [address] Phone: [phone]   Fax:  [phone]  Physical Therapy Treatment  Patient Details  Name: Shawna Marquez MRN: [id number] Date of Birth: 1940-12-18 Referring Provider:  Chauncey Cruel, MD  Encounter Date: 06/01/2015      PT End of Session - 06/01/15 1124    Visit Number 15   Number of Visits 19   Date for PT Re-Evaluation 06/18/15   PT Start Time 0935   PT Stop Time 1015   PT Time Calculation (min) 40 min   Activity Tolerance Patient tolerated treatment well   Behavior During Therapy [facility] for tasks assessed/performed      Past Medical History  Diagnosis Date  . ALLERGIC RHINITIS 10/12/2007  . BREAST CANCER, HX OF 10/12/2007    right breast diagnosed - 11/2014  . DIVERTICULITIS, HX OF 10/12/2007  . HYPOTHYROIDISM, PRIMARY 10/12/2007  . 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 Wears glasses   . Family history of breast cancer   . Complication of anesthesia     bp will drop,hard to wake up  . Sleep apnea     USES cpap NIGHTLY  . OSTEOARTHRITIS 09/09/2007    back     Past Surgical History  Procedure Laterality Date  . Colovaginal fistula  2007    colon resection  . Abdominal hysterectomy  1977  . Hernia repair  2007    ventral  . Colon surgery  2006    part colectomy  . Mastectomy  1997    lt mast-axillary node dissection  . Tonsillectomy    . Colonoscopy  2007  . Wisdom tooth extraction    . Eye surgery      both cataracts  . Radioactive seed guided mastectomy with axillary sentinel lymph node biopsy Right 11/28/2014    Procedure: RADIOACTIVE SEED GUIDED RIGHT LUMPECTOMY WITH RIGHT AXILLARY SENTINEL LYMPH NODE BIOPSY;  Surgeon: Rolm Bookbinder, MD;  Location: [place];  Service: General;  Laterality: Right;  . Re-excision of breast cancer,superior margins Right 01/01/2015    Procedure: RIGHT BREAST MARGIN EXCISION;   Surgeon: Rolm Bookbinder, MD;  Location: [place];  Service: General;  Laterality: Right;  . Portacath placement Left 01/01/2015    Procedure: INSERTION PORT-A-CATH;  Surgeon: Rolm Bookbinder, MD;  Location: [place];  Service: General;  Laterality: Left;    There were no vitals filed for this visit.  Visit Diagnosis:  Lymphedema of upper extremity following lymphadenectomy      Subjective Assessment - 06/01/15 0937    Subjective Took the bandages off at 8 o'clock this morning.   Currently in Pain? No/denies                         [facility] Adult PT Treatment/Exercise - 06/01/15 0001    Manual Therapy   Manual Lymphatic Drainage (MLD) In supine, short neck, superficial and deep abdomen, left groin and axillo-inguinal anastomosis, and left UE from fingers to shoulder.    Compression Bandaging Lotion applied; no peach small dot foam today; bandaging with TG soft, Elastomull fingers 2-5 fingers, Artiflex, and 3 short stretch bandages from hand to shoulder                        [facility] Clinic Goals - 05/30/15 1014    CC Long Term Goal  #4   Title Patient will decrease  the DASH score to < 50   to demonstrate increased functional use of upper extremity   Status On-going            Plan - 06/01/15 1124    Clinical Impression Statement Pt. doing well.  Awaiting delivery of compression garments, and has decreased visit frequency to 1x/week until those arrive in a couple of weeks.  No problems today.   Pt will benefit from skilled therapeutic intervention in order to improve on the following deficits Increased edema   Rehab Potential Excellent   PT Frequency 1x / week   PT Duration 2 weeks   PT Treatment/Interventions Manual lymph drainage;Compression bandaging   PT Next Visit Plan Continue complete decongestive therapy until garments arrive.   Consulted and Agree with Plan of Care Patient        Problem List Patient Active Problem List    Diagnosis Date Noted  . Lymphedema of arm 04/12/2015  . Family history of breast cancer   . 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 breast 11/17/2014  . Hypothyroidism 10/12/2007  . Allergic rhinitis 10/12/2007  . BREAST CANCER, HX OF 10/12/2007  . DIVERTICULITIS, HX OF 10/12/2007  . OSTEOARTHRITIS 09/09/2007    Jansel Vonstein 06/01/2015, 11:28 AM  [place], [place], [address] Phone: [phone]   Fax:  [place], PT 06/01/2015 11:28 AM

## 2015-06-03 NOTE — Progress Notes (Signed)
  Radiation Oncology         [phone] ________________________________  Name: Shawna Marquez MRN: [id number]  Date: 05/25/2015  DOB: May 23, 1941  End of Treatment Note  Diagnosis:   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 breast   Staging form: Breast, AJCC 7th Edition     Clinical stage from 11/22/2014: Stage IA (T1b, N0, M0) - Unsigned     Pathologic: Stage IIA (T1c, N1a, cM0) - Signed by Seward Grater, MD on 12/26/2014       Staging comments: Staged on final lumpectomy specimen read by Dr. Saralyn Pilar.  Indication for treatment: Curative    Radiation treatment dates:  04/10/2015-05/25/2015  Site/dose:    Right breast / 45 Gray @ 1.8 Pearline Cables per fraction x 25 fractions Right breast boost / 16 Gray at [employer] per fraction x 8 fractions  Beams/energy:  Opposed Tangents / 6 MV photons En face / 12 MeV electrons  Narrative: The patient tolerated radiation treatment relatively well.   She had the expected breast soreness and skin irritation of the right breast. She developed portacath associated lymphedema of the left arm and was referred to physical therapy.   Plan: The patient has completed radiation treatment. The patient will return to radiation oncology clinic for routine followup in one month. I advised them to call or return sooner if they have any questions or concerns related to their recovery or treatment.  ------------------------------------------------  Thea Silversmith, MD

## 2015-06-06 ENCOUNTER — Ambulatory Visit: Payer: Medicare Other

## 2015-06-06 DIAGNOSIS — E8989 Other postprocedural endocrine and metabolic complications and disorders: Secondary | ICD-10-CM

## 2015-06-06 DIAGNOSIS — M549 Dorsalgia, unspecified: Secondary | ICD-10-CM | POA: Diagnosis not present

## 2015-06-06 DIAGNOSIS — I890 Lymphedema, not elsewhere classified: Principal | ICD-10-CM

## 2015-06-06 DIAGNOSIS — G8929 Other chronic pain: Secondary | ICD-10-CM

## 2015-06-06 DIAGNOSIS — M25611 Stiffness of right shoulder, not elsewhere classified: Secondary | ICD-10-CM | POA: Diagnosis not present

## 2015-06-06 NOTE — Therapy (Signed)
[place], [place], [address] Phone: [phone]   Fax:  [phone]  Physical Therapy Treatment  Patient Details  Name: Shawna Marquez MRN: [id number] Date of Birth: [birth year] Referring Provider:  Chauncey Cruel, MD  Encounter Date: 06/06/2015      PT End of Session - 06/06/15 1023    Visit Number 16   Number of Visits 19   Date for PT Re-Evaluation 06/18/15   PT Start Time 0938   PT Stop Time 1017   PT Time Calculation (min) 39 min   Activity Tolerance Patient tolerated treatment well   Behavior During Therapy [facility] for tasks assessed/performed      Past Medical History  Diagnosis Date  . ALLERGIC RHINITIS 10/12/2007  . BREAST CANCER, HX OF 10/12/2007    right breast diagnosed - 11/2014  . DIVERTICULITIS, HX OF 10/12/2007  . HYPOTHYROIDISM, PRIMARY 10/12/2007  . 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 Wears glasses   . Family history of breast cancer   . Complication of anesthesia     bp will drop,hard to wake up  . Sleep apnea     USES cpap NIGHTLY  . OSTEOARTHRITIS 09/09/2007    back     Past Surgical History  Procedure Laterality Date  . Colovaginal fistula  2007    colon resection  . Abdominal hysterectomy  1977  . Hernia repair  2007    ventral  . Colon surgery  2006    part colectomy  . Mastectomy  1997    lt mast-axillary node dissection  . Tonsillectomy    . Colonoscopy  2007  . Wisdom tooth extraction    . Eye surgery      both cataracts  . Radioactive seed guided mastectomy with axillary sentinel lymph node biopsy Right 11/28/2014    Procedure: RADIOACTIVE SEED GUIDED RIGHT LUMPECTOMY WITH RIGHT AXILLARY SENTINEL LYMPH NODE BIOPSY;  Surgeon: Rolm Bookbinder, MD;  Location: [place];  Service: General;  Laterality: Right;  . Re-excision of breast cancer,superior margins Right 01/01/2015    Procedure: RIGHT BREAST MARGIN EXCISION;   Surgeon: Rolm Bookbinder, MD;  Location: [place];  Service: General;  Laterality: Right;  . Portacath placement Left 01/01/2015    Procedure: INSERTION PORT-A-CATH;  Surgeon: Rolm Bookbinder, MD;  Location: [place];  Service: General;  Laterality: Left;    There were no vitals filed for this visit.  Visit Diagnosis:  Lymphedema of upper extremity following lymphadenectomy  Back pain, chronic  Stiffness of joint, shoulder region, right      Subjective Assessment - 06/06/15 0941    Subjective Rode to [facility] yesterday with my daughter and after resting my arm on the arm rest for the trip I have a induration there on my arm. Havent heard from [place] yet. Took bandages off Monday afternoon, but wrapped one bandage on my arm yesterday for the trip with the stockinette.    Currently in Pain? No/denies               LYMPHEDEMA/ONCOLOGY QUESTIONNAIRE - 06/06/15 0944    Left Upper Extremity Lymphedema   10 cm Proximal to Olecranon Process 30.5 cm   Olecranon Process 27.5 cm   10 cm Proximal to Ulnar Styloid Process 24.3 cm   Just Proximal to Ulnar Styloid Process 17.4 cm   Across Hand at [employer] 18.7 cm   At [place] of 2nd Digit 6.4 cm  [facility] Adult PT Treatment/Exercise - 06/06/15 0001    Manual Therapy   Manual Lymphatic Drainage (MLD) In supine, short neck, superficial and deep abdomen, left groin and axillo-inguinal anastomosis, and left UE from fingers to shoulder.    Compression Bandaging Lotion applied; bandaging with TG soft, Elastomull fingers 2-5 fingers, Artiflex with peach small dot foam at medial upper arm and 3 short stretch bandages from hand to shoulder                        Long Term Clinic Goals - 06/06/15 1028    CC Long Term Goal  #4   Title Patient will decrease the DASH score to < 50   to demonstrate increased functional use of upper extremity   Status On-going            Plan - 06/06/15 1024     Clinical Impression Statement Pt continues to do well. Her circumference measurements had mostly reduced overall and pt is just awaiting arrival of her compression garments now. Issued the phone number for Rexford Maus as pt reported she hasnt heard from her yet for payment and to assess possible arrival.    Pt will benefit from skilled therapeutic intervention in order to improve on the following deficits Increased edema   Rehab Potential Excellent   Clinical Impairments Affecting Rehab Potential Concurrent radiation to right upper quadrant.     PT Frequency 1x / week   PT Duration 2 weeks   PT Treatment/Interventions Manual lymph drainage;Compression bandaging   PT Next Visit Plan Have pt complete the DASH next visit for goal assessment. Continue complete decongestive therapy until garments arrive.   Consulted and Agree with Plan of Care Patient        Problem List Patient Active Problem List   Diagnosis Date Noted  . Lymphedema of arm 04/12/2015  . Family history of breast cancer   . 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 breast 11/17/2014  . Hypothyroidism 10/12/2007  . Allergic rhinitis 10/12/2007  . BREAST CANCER, HX OF 10/12/2007  . DIVERTICULITIS, HX OF 10/12/2007  . OSTEOARTHRITIS 09/09/2007    Otelia Limes, PTA 06/06/2015, 10:29 AM  [place], [place], [address] Phone: [phone]   Fax:  [phone]

## 2015-06-11 NOTE — Progress Notes (Signed)
Name: Shawna Marquez   MRN: [id number]  Date:  05/03/15   DOB: [birth year]  Status:outpatient    DIAGNOSIS: 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 breast   Staging form: Breast, AJCC 7th Edition     Clinical stage from 11/22/2014: Stage IA (T1b, N0, M0) - Unsigned     Pathologic: Stage IIA (T1c, N1a, cM0) - Signed by Seward Grater, MD on 12/26/2014       Staging comments: Staged on final lumpectomy specimen read by Dr. Saralyn Pilar.  CONSENT VERIFIED: yes   SET UP: Patient is setup supine   IMMOBILIZATION:  The following immobilization was used:Custom Moldable Pillow, breast board.   NARRATIVE: [name] underwent complex simulation and treatment planning for her boost treatment today.  Her tumor volume was outlined on the planning CT scan. The depth of her cavity was felt to be appropriate for treatment with electrons    12  MeV electrons will be prescribed to the 100%  isodose line.   I personally oversaw and approved the construction of a unique block which will be used for beam modification purposes.  An isodose plan is requested.

## 2015-06-13 ENCOUNTER — Ambulatory Visit: Payer: Medicare Other | Attending: General Surgery

## 2015-06-13 DIAGNOSIS — M549 Dorsalgia, unspecified: Secondary | ICD-10-CM | POA: Diagnosis not present

## 2015-06-13 DIAGNOSIS — E8989 Other postprocedural endocrine and metabolic complications and disorders: Secondary | ICD-10-CM | POA: Diagnosis not present

## 2015-06-13 DIAGNOSIS — M25611 Stiffness of right shoulder, not elsewhere classified: Secondary | ICD-10-CM

## 2015-06-13 DIAGNOSIS — I890 Lymphedema, not elsewhere classified: Secondary | ICD-10-CM | POA: Insufficient documentation

## 2015-06-13 DIAGNOSIS — G8929 Other chronic pain: Secondary | ICD-10-CM | POA: Insufficient documentation

## 2015-06-13 NOTE — Therapy (Signed)
[facility] [address] [place], [place], [address] Phone: [phone]   Fax:  [phone]  Physical Therapy Treatment  Patient Details  Name: Shawna Marquez MRN: [id number] Date of Birth: 05-17-1941 Referring Provider:  Lowella Dell, MD  Encounter Date: 06/13/2015      PT End of Session - 06/13/15 1653    Visit Number 17   Number of Visits 19   Date for PT Re-Evaluation 06/18/15   PT Start Time 1555   PT Stop Time 1650   PT Time Calculation (min) 55 min   Activity Tolerance Patient tolerated treatment well   Behavior During Therapy [facility] for tasks assessed/performed      Past Medical History  Diagnosis Date  . ALLERGIC RHINITIS 10/12/2007  . BREAST CANCER, HX OF 10/12/2007    right breast diagnosed - 11/2014  . DIVERTICULITIS, HX OF 10/12/2007  . HYPOTHYROIDISM, PRIMARY 10/12/2007  . 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 Wears glasses   . Family history of breast cancer   . Complication of anesthesia     bp will drop,hard to wake up  . Sleep apnea     USES cpap NIGHTLY  . OSTEOARTHRITIS 09/09/2007    back     Past Surgical History  Procedure Laterality Date  . Colovaginal fistula  2007    colon resection  . Abdominal hysterectomy  1977  . Hernia repair  2007    ventral  . Colon surgery  2006    part colectomy  . Mastectomy  1997    lt mast-axillary node dissection  . Tonsillectomy    . Colonoscopy  2007  . Wisdom tooth extraction    . Eye surgery      both cataracts  . Radioactive seed guided mastectomy with axillary sentinel lymph node biopsy Right 11/28/2014    Procedure: RADIOACTIVE SEED GUIDED RIGHT LUMPECTOMY WITH RIGHT AXILLARY SENTINEL LYMPH NODE BIOPSY;  Surgeon: Emelia Loron, MD;  Location: [facility];  Service: General;  Laterality: Right;  . Re-excision of breast cancer,superior margins Right 01/01/2015    Procedure: RIGHT BREAST MARGIN EXCISION;   Surgeon: Emelia Loron, MD;  Location: [facility] OR;  Service: General;  Laterality: Right;  . Portacath placement Left 01/01/2015    Procedure: INSERTION PORT-A-CATH;  Surgeon: Emelia Loron, MD;  Location: [facility] OR;  Service: General;  Laterality: Left;    There were no vitals filed for this visit.  Visit Diagnosis:  Back pain, chronic  Stiffness of joint, shoulder region, right  Lymphedema of upper extremity following lymphadenectomy      Subjective Assessment - 06/13/15 1558    Subjective Called and spoke with Carolyns office, at first they told me my sleeve hadnt been ordered due to a mix up, but then they called me back and said it WAS there and I didnt have to pay anything! So she is going to meet me here Friday morning to give it to me because I'm going out of town next week.   Currently in Pain? No/denies               LYMPHEDEMA/ONCOLOGY QUESTIONNAIRE - 06/13/15 1600    Left Upper Extremity Lymphedema   10 cm Proximal to Olecranon Process 30.3 cm   Olecranon Process 27.3 cm   10 cm Proximal to Ulnar Styloid Process 25 cm   Just Proximal to Ulnar Styloid Process 16.7 cm   Across Hand at [facility] 18.6 cm  At [facility] of 2nd Digit 6.5 cm           Quick Dash - 06/13/15 0001    Open a tight or new jar Mild difficulty   Do heavy household chores (wash walls, wash floors) Mild difficulty   Carry a shopping bag or briefcase Mild difficulty   Wash your back Mild difficulty   Use a knife to cut food Mild difficulty   Recreational activities in which you take some force or impact through your arm, shoulder, or hand (golf, hammering, tennis) Mild difficulty   During the past week, to what extent has your arm, shoulder or hand problem interfered with your normal social activities with family, friends, neighbors, or groups? Slightly   During the past week, to what extent has your arm, shoulder or hand problem limited your work or other regular daily activities Slightly    Arm, shoulder, or hand pain. Mild   Tingling (pins and needles) in your arm, shoulder, or hand None   Difficulty Sleeping No difficulty   DASH Score 20.45 %               [facility] Adult PT Treatment/Exercise - 06/13/15 0001    Manual Therapy   Manual Lymphatic Drainage (MLD) In supine, short neck, superficial and deep abdomen, left groin and axillo-inguinal anastomosis, and left UE from fingers to shoulder.    Compression Bandaging Lotion applied; bandaging with TG soft, Elastomull fingers 2-5 fingers, Artiflex with peach small dot foam at medial upper arm and 3 short stretch bandages from hand to shoulder                        Long Term Clinic Goals - 06/13/15 1658    CC Long Term Goal  #4   Title Patient will decrease the DASH score to < 50   to demonstrate increased functional use of upper extremity  DASH score decreased to 20% limited 06/13/15   Status Achieved            Plan - 06/13/15 1654    Clinical Impression Statement Patient has done well with her treatment and will be receiving her compression sleeve Friday. All her circumferential measurements have decreased since her last visit except her forearm was slightly elevated. Her DASH score has decreased from >50% limited to 20% limited, gaol met.    Pt will benefit from skilled therapeutic intervention in order to improve on the following deficits Increased edema   Rehab Potential Excellent   PT Frequency 1x / week   PT Duration 2 weeks   PT Treatment/Interventions Manual lymph drainage;Compression bandaging   PT Next Visit Plan Discharge this visit.    Consulted and Agree with Plan of Care Patient        Problem List Patient Active Problem List   Diagnosis Date Noted  . Lymphedema of arm 04/12/2015  . Family history of breast cancer   . 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 breast 11/17/2014  . Hypothyroidism 10/12/2007  . Allergic rhinitis 10/12/2007  . BREAST CANCER, HX  OF 10/12/2007  . DIVERTICULITIS, HX OF 10/12/2007  . OSTEOARTHRITIS 09/09/2007    Otelia Limes, PTA 06/13/2015, 4:59 PM  [place], [place], [address] Phone: [phone]   Fax:  [phone]

## 2015-06-13 NOTE — Therapy (Signed)
[facility] [address] [place], [place], [address] Phone: [phone]   Fax:  [phone]  Physical Therapy Treatment  Patient Details  Name: Shawna Marquez MRN: [id number] Date of Birth: [birth year] Referring Provider:  Lowella Dell, MD  Encounter Date: 06/13/2015      PT End of Session - 06/13/15 1653    Visit Number 17   Number of Visits 19   Date for PT Re-Evaluation 06/18/15   PT Start Time 1555   PT Stop Time 1650   PT Time Calculation (min) 55 min   Activity Tolerance Patient tolerated treatment well   Behavior During Therapy [facility] for tasks assessed/performed      Past Medical History  Diagnosis Date  . ALLERGIC RHINITIS 10/12/2007  . BREAST CANCER, HX OF 10/12/2007    right breast diagnosed - 11/2014  . DIVERTICULITIS, HX OF 10/12/2007  . HYPOTHYROIDISM, PRIMARY 10/12/2007  . 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 Wears glasses   . Family history of breast cancer   . Complication of anesthesia     bp will drop,hard to wake up  . Sleep apnea     USES cpap NIGHTLY  . OSTEOARTHRITIS 09/09/2007    back     Past Surgical History  Procedure Laterality Date  . Colovaginal fistula  2007    colon resection  . Abdominal hysterectomy  1977  . Hernia repair  2007    ventral  . Colon surgery  2006    part colectomy  . Mastectomy  1997    lt mast-axillary node dissection  . Tonsillectomy    . Colonoscopy  2007  . Wisdom tooth extraction    . Eye surgery      both cataracts  . Radioactive seed guided mastectomy with axillary sentinel lymph node biopsy Right 11/28/2014    Procedure: RADIOACTIVE SEED GUIDED RIGHT LUMPECTOMY WITH RIGHT AXILLARY SENTINEL LYMPH NODE BIOPSY;  Surgeon: Emelia Loron, MD;  Location: [facility];  Service: General;  Laterality: Right;  . Re-excision of breast cancer,superior margins Right 01/01/2015    Procedure: RIGHT BREAST MARGIN EXCISION;   Surgeon: Emelia Loron, MD;  Location: [facility] OR;  Service: General;  Laterality: Right;  . Portacath placement Left 01/01/2015    Procedure: INSERTION PORT-A-CATH;  Surgeon: Emelia Loron, MD;  Location: [facility] OR;  Service: General;  Laterality: Left;    There were no vitals filed for this visit.  Visit Diagnosis:  Back pain, chronic  Stiffness of joint, shoulder region, right  Lymphedema of upper extremity following lymphadenectomy      Subjective Assessment - 06/13/15 1558    Subjective Called and spoke with Carolyns office, at first they told me my sleeve hadnt been ordered due to a mix up, but then they called me back and said it WAS there and I didnt have to pay anything! So she is going to meet me here Friday morning to give it to me because I'm going out of town next week.   Currently in Pain? No/denies               LYMPHEDEMA/ONCOLOGY QUESTIONNAIRE - 06/13/15 1600    Left Upper Extremity Lymphedema   10 cm Proximal to Olecranon Process 30.3 cm   Olecranon Process 27.3 cm   10 cm Proximal to Ulnar Styloid Process 25 cm   Just Proximal to Ulnar Styloid Process 16.7 cm   Across Hand at [facility] 18.6 cm  At [facility] of 2nd Digit 6.5 cm           Quick Dash - 06/13/15 0001    Open a tight or new jar Mild difficulty   Do heavy household chores (wash walls, wash floors) Mild difficulty   Carry a shopping bag or briefcase Mild difficulty   Wash your back Mild difficulty   Use a knife to cut food Mild difficulty   Recreational activities in which you take some force or impact through your arm, shoulder, or hand (golf, hammering, tennis) Mild difficulty   During the past week, to what extent has your arm, shoulder or hand problem interfered with your normal social activities with family, friends, neighbors, or groups? Slightly   During the past week, to what extent has your arm, shoulder or hand problem limited your work or other regular daily activities Slightly    Arm, shoulder, or hand pain. Mild   Tingling (pins and needles) in your arm, shoulder, or hand None   Difficulty Sleeping No difficulty   DASH Score 20.45 %               [facility] Adult PT Treatment/Exercise - 06/13/15 0001    Manual Therapy   Manual Lymphatic Drainage (MLD) In supine, short neck, superficial and deep abdomen, left groin and axillo-inguinal anastomosis, and left UE from fingers to shoulder.    Compression Bandaging Lotion applied; bandaging with TG soft, Elastomull fingers 2-5 fingers, Artiflex with peach small dot foam at medial upper arm and 3 short stretch bandages from hand to shoulder                        Long Term Clinic Goals - 06/13/15 1658    CC Long Term Goal  #4   Title Patient will decrease the DASH score to < 50   to demonstrate increased functional use of upper extremity  DASH score decreased to 20% limited 06/13/15   Status Achieved            Plan - 06/13/15 1654    Clinical Impression Statement Patient has done well with her treatment and will be receiving her compression sleeve Friday. All her circumferential measurements have decreased since her last visit except her forearm was slightly elevated. Her DASH score has decreased from >50% limited to 20% limited, gaol met.    Pt will benefit from skilled therapeutic intervention in order to improve on the following deficits Increased edema   Rehab Potential Excellent   PT Frequency 1x / week   PT Duration 2 weeks   PT Treatment/Interventions Manual lymph drainage;Compression bandaging   PT Next Visit Plan Discharge this visit.    Consulted and Agree with Plan of Care Patient        Problem List Patient Active Problem List   Diagnosis Date Noted  . Lymphedema of arm 04/12/2015  . Family history of breast cancer   . 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 breast 11/17/2014  . Hypothyroidism 10/12/2007  . Allergic rhinitis 10/12/2007  . BREAST CANCER, HX  OF 10/12/2007  . DIVERTICULITIS, HX OF 10/12/2007  . OSTEOARTHRITIS 09/09/2007    SALISBURY,DONNA 06/13/2015, 5:00 PM  [place], [place], [address] Phone: [phone]   Fax:  [phone]     PHYSICAL THERAPY DISCHARGE SUMMARY  Visits from Start of Care: 17  Current functional level related to goals / functional outcomes: All goals  met, including reductions in circumference measurements at two levels, patient knowing where/how to obtain compression garments, and reduction of quick DASH score.   Remaining deficits: Edema will continue to require management.   Education / Equipment: Compression garments, lymphedema self-care and risk reduction. Plan: Patient agrees to discharge.  Patient goals were met. Patient is being discharged due to meeting the stated rehab goals.  ?????   .d  [name], PT 06/13/2015 5:04 PM

## 2015-06-18 ENCOUNTER — Telehealth: Payer: Self-pay | Admitting: Adult Health

## 2015-06-18 NOTE — Telephone Encounter (Signed)
I briefly spoke with Ms. Kopke regarding her eligibility to see me in Survivorship now that she has completed her treatment for breast cancer.  I offered to coordinate her survivorship visit with her routine 42-month f/u appt with Dr. Pablo Ledger on 06/28/15.  She tells me that she is currently in [place] helping her family move and she could not talk at this time.  She said that she would call me back and let me know what days/times would work for her. I will await her returned call. I gave her my direct office number. I look forward to participating in her care.   Mike Craze, NP [place] [phone]

## 2015-06-28 ENCOUNTER — Ambulatory Visit: Payer: Medicare Other | Admitting: Radiation Oncology

## 2015-07-03 DIAGNOSIS — Z452 Encounter for adjustment and management of vascular access device: Secondary | ICD-10-CM | POA: Diagnosis not present

## 2015-07-03 DIAGNOSIS — Z853 Personal history of malignant neoplasm of breast: Secondary | ICD-10-CM | POA: Diagnosis not present

## 2015-07-09 ENCOUNTER — Ambulatory Visit: Payer: Medicare Other | Admitting: Hematology and Oncology

## 2015-07-09 ENCOUNTER — Other Ambulatory Visit: Payer: Medicare Other

## 2015-07-12 ENCOUNTER — Ambulatory Visit
Admission: RE | Admit: 2015-07-12 | Discharge: 2015-07-12 | Disposition: A | Discharge status: Home / Self Care | Payer: Medicare Other | Source: Ambulatory Visit | Attending: Radiation Oncology | Admitting: Radiation Oncology

## 2015-07-12 ENCOUNTER — Encounter: Payer: Self-pay | Admitting: Radiation Oncology

## 2015-07-12 VITALS — BP 108/62 | HR 80 | Temp 99.0°F | Wt 170.5 lb

## 2015-07-12 DIAGNOSIS — C50211 Malignant neoplasm of upper-inner quadrant of right female breast: Secondary | ICD-10-CM

## 2015-07-12 HISTORY — DX: Reserved for concepts with insufficient information to code with codable children: IMO0002

## 2015-07-12 HISTORY — DX: Reserved for inherently not codable concepts without codable children: IMO0001

## 2015-07-12 NOTE — Progress Notes (Signed)
   Department of Radiation Oncology  Phone:  [phone] Fax:        [phone]   Name: Shawna Marquez MRN: [id number]  DOB: 1941-06-06  Date: 07/12/2015  Follow Up Visit Note  Diagnosis: 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 breast   Staging form: Breast, AJCC 7th Edition     Clinical stage from 11/22/2014: Stage IA (T1b, N0, M0) - Unsigned     Pathologic: Stage IIA (T1c, N1a, cM0) - Signed by Seward Grater, MD on 12/26/2014       Staging comments: Staged on final lumpectomy specimen read by Dr. Saralyn Pilar.  Summary and Interval since last radiation: The patients radiation treatment dates extended from  04/10/2015 to 05/25/2015. The site and dose included the right breast at 45 Gray @ 1.8 [employer] per fraction x 25 fractions and the right breast boost at 16 Gray at [employer] per fraction x 8 fractions. The beams and energy included opposed tangents at 6 MV photons and en face electrons at 12 MeV electrons.  Interval History: Shawna Marquez presents today for routine followup. She is feeling well. Her lymphedema has decreased significantly since her PAC was removed. She has started her anastrazole and is tolerated that well. She has no complaints.   Physical Exam:  Filed Vitals:   07/12/15 0858  BP: 108/62  Pulse: 80  Temp: 99 F (37.2 C)  Weight: 170 lb 8 oz (77.338 kg)   Breast: Minor hyperpigmentation was observed with dryness of the skin was observed during today's appointment. She is wearing a lymphedema sleeve on her left arm.   IMPRESSION: Shawna Marquez is a 74 y.o. female presenting to clinic in regards to her cancer of the right female breast. The swelling in the patient's left arm has reduced greatly since the recent removal of her Port-A-Cath. The patient is tolerating the prescribed medication Anastrozole.   PLAN:  The patient is doing well. The patient was advised of the need for a follow up appointment with radiation oncology every 4-6 months, which the patient has scheduled. The  necessity for yearly mammograms, which the patient has schedule with her OBGYN or with medical oncology, was fully discussed. The necessity of sun protection in the treated area was addressed. All questions and concerns vocalized by the patient were fully addressed. If the patient develops any further questions or concerns, she has been encouraged to contact Dr. Pablo Ledger. The patient has been advised to continue appropriate use of the prescribed medication Anastrozole. The patient has been advised to follow up with radiation oncology as needed.   This document serves as a record of services personally performed by Thea Silversmith , MD. It was created on her behalf by Lenn Cal, a trained medical scribe. The creation of this record is based on the scribe's personal observations and the provider's statements to them. This document has been checked and approved by the attending provider.   ------------------------------------------------  Thea Silversmith, MD

## 2015-07-12 NOTE — Progress Notes (Signed)
One month follow up completion of radiation to right breast on 05/25/15.Denies pain.Skin looks great.Continue application of lotion with vitamin e.started anastrozole on 07/09/15.Left arm lymphedema is much improved.

## 2015-07-23 ENCOUNTER — Telehealth: Payer: Self-pay | Admitting: Adult Health

## 2015-07-23 NOTE — Telephone Encounter (Signed)
I spoke with Ms. Hitzeman  to schedule her Survivorship Clinic appt to see Chestine Spore, NP.  I gave her instructions on where to check in for this appt.  We look forward to participating in her care.   Mike Craze, NP [place] [phone]

## 2015-08-03 ENCOUNTER — Encounter: Payer: Self-pay | Admitting: Nurse Practitioner

## 2015-08-03 ENCOUNTER — Ambulatory Visit (HOSPITAL_BASED_OUTPATIENT_CLINIC_OR_DEPARTMENT_OTHER): Payer: Medicare Other | Admitting: Nurse Practitioner

## 2015-08-03 VITALS — BP 133/78 | HR 81 | Temp 98.9°F | Resp 18 | Ht 61.0 in | Wt 173.1 lb

## 2015-08-03 DIAGNOSIS — C50211 Malignant neoplasm of upper-inner quadrant of right female breast: Secondary | ICD-10-CM

## 2015-08-03 NOTE — Progress Notes (Signed)
CLINIC:  Cancer Survivorship   REASON FOR VISIT:  Routine follow-up post-treatment for a recent history of breast cancer.  BRIEF ONCOLOGIC HISTORY:    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 breast   06/07/1996 Miscellaneous History of Stage IIB left breast cancer S/P mastectomy and chemotherapy (Adriamycin and Cytoxan) on CALGB 9397.   11/10/2014 Mammogram Right breast: 9 mm mass    11/13/2014 Breast US Right breast: 9 mm mass at 1:00 position with indistinct margin, middle depth, hypoechoic.   11/15/2014 Initial Biopsy Right breast needle biopsy: Invasive ductal carcinoma, grade 2, with DCIS with lymphovascular invasion ER+ (100%), PR+ (100%), HER-2/neu negative (ratio 1.87), Ki-67 23%   11/15/2014 Clinical Stage Stage IA; T1b N0   11/28/2014 Definitive Surgery Right breast lumpectomy with SLNB Donne Hazel): Invasive ductal carcinoma, grade 3, 1.7 cm, with DCIS, focal vascular involvement, posterior margin involvement, 1/2 sentinel nodes positive for malignancy, repeat HER2/neu negative (ratio 1.14)   11/28/2014 Procedure Mammaprint: High Risk Luminal Type B.    11/28/2014 Pathologic Stage Stage IIA: pT1c, pN1a   01/01/2015 Surgery Rexcision of right breast margin: surgical margins negative for malignancy   01/15/2015 - 03/20/2015 Chemotherapy Adjuvant chemotherapy with Taxotere and Cyclophosphamide X 4 cycles (Gudena)   04/10/2015 - 05/25/2015 Radiation Therapy Adjuvant XRT completed Pablo Ledger): Right breast 45 Gy over 25 fractions; Right breast boost 16 Gy at 2 Gy over 8 fractions. Total dose 61 Gy   05/31/2015 -  Anti-estrogen oral therapy Anastrozole 1 mg daily (Gudena).  Planned duration of therapy: 5 years.    INTERVAL HISTORY:  Shawna Marquez presents to the [facility] today for our initial meeting to review her survivorship care plan detailing her treatment course for breast cancer, as well as monitoring long-term side effects of that treatment, education regarding health  maintenance, screening, and overall wellness and health promotion.     Overall, Shawna Marquez reports feeling quite well since completing her radiation therapy approximately two months ago. She reports that her skin changes overlying her right breast have resolved and she denies excessive fatigue.  Her chemotherapy was completed approximately four months ago and aside from some continued nail changes from the docetaxel, she is doing well. She denies any mass or nodule within her breast, shortness of breath, pain, or change in weight. She reports good spirits and feels that she is doing well overall.  Her hair is beginning to regrow.  She does report mild peripheral neuropathy in her toes, but feels that this has almost completely resolved.  As above, this is the second breast cancer diagnosis for Shawna Marquez and she feels that she has done quite well this time around.  She saw Dr. Donne Hazel this morning who felt that she was doing well and will see Dr. Lindi Adie next month.  REVIEW OF SYSTEMS:  General: Denies fever, chills, or unintentional weight loss. Cardiac: Denies palpitations, chest pain, and lower extremity edema.  Respiratory: Denies cough and dyspnea on exertion.  GI: Denies abdominal pain, constipation, diarrhea, nausea, or vomiting.  GU: Denies dysuria, hematuria, vaginal bleeding, vaginal discharge, or vaginal dryness.  Musculoskeletal: Denies joint or bone pain.  Neuro: Denies headache or recent falls.  Skin: Denies rash, pruritis, or open wounds.  Breast: Denies any new nipple changes or nipple discharge.  Psych: Denies depression, anxiety, insomnia, or memory loss.   A 14-point review of systems was completed and was negative, except as noted above.   ONCOLOGY TREATMENT TEAM:  1. Surgeon:  Dr. Donne Hazel at  [facility]  2. Medical Oncologist: Dr. Lindi Adie 3. Radiation Oncologist: Dr. Pablo Ledger    PAST MEDICAL/SURGICAL HISTORY:  Past Medical History  Diagnosis Date  .  ALLERGIC RHINITIS 10/12/2007  . BREAST CANCER, HX OF 10/12/2007    right breast diagnosed - 11/2014  . DIVERTICULITIS, HX OF 10/12/2007  . HYPOTHYROIDISM, PRIMARY 10/12/2007  . 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 Wears glasses   . Family history of breast cancer   . Complication of anesthesia     bp will drop,hard to wake up  . Sleep apnea     USES cpap NIGHTLY  . OSTEOARTHRITIS 09/09/2007    back   . Radiation 04/10/15-05/25/15    Right Breast   Past Surgical History  Procedure Laterality Date  . Colovaginal fistula  2007    colon resection  . Abdominal hysterectomy  1977  . Hernia repair  2007    ventral  . Colon surgery  2006    part colectomy  . Mastectomy  1997    lt mast-axillary node dissection  . Tonsillectomy    . Colonoscopy  2007  . Wisdom tooth extraction    . Eye surgery      both cataracts  . Radioactive seed guided mastectomy with axillary sentinel lymph node biopsy Right 11/28/2014    Procedure: RADIOACTIVE SEED GUIDED RIGHT LUMPECTOMY WITH RIGHT AXILLARY SENTINEL LYMPH NODE BIOPSY;  Surgeon: Rolm Bookbinder, MD;  Location: [facility];  Service: General;  Laterality: Right;  . Re-excision of breast cancer,superior margins Right 01/01/2015    Procedure: RIGHT BREAST MARGIN EXCISION;  Surgeon: Rolm Bookbinder, MD;  Location: [place];  Service: General;  Laterality: Right;  . Portacath placement Left 01/01/2015    Procedure: INSERTION PORT-A-CATH;  Surgeon: Rolm Bookbinder, MD;  Location: [place];  Service: General;  Laterality: Left;     ALLERGIES:  No Known Allergies   CURRENT MEDICATIONS:  Current Outpatient Prescriptions on File Prior to Visit  Medication Sig Dispense Refill  . anastrozole (ARIMIDEX) 1 MG tablet Take 1 tablet (1 mg total) by mouth daily. 90 tablet 3  . aspirin EC 81 MG tablet Take 81 mg by mouth at bedtime.    . Calcium Citrate-Vitamin D (CALCIUM CITRATE + D3 PO) Take 1 tablet by mouth 2 (two) times  daily. Calcium 500 mg, Vitamin D 800 units    . cholecalciferol (VITAMIN D) 1000 UNITS tablet Take 1,000 Units by mouth daily with lunch.     . Coenzyme Q10 (COQ10) 200 MG CAPS Take 200 mg by mouth every other day.    . fluticasone (FLONASE) 50 MCG/ACT nasal spray INSTILL 1 SPRAY INTO EACH NOSTRIL TWICE DAILY 48 g 3  . folic acid (FOLVITE) 242 MCG tablet Take 400 mcg by mouth daily with lunch.     . hydrocortisone cream 1 % Apply 1 application topically daily as needed for itching (eczema).    Marland Kitchen levothyroxine (SYNTHROID, LEVOTHROID) 125 MCG tablet Take 1 tablet (125 mcg total) by mouth daily before breakfast. 90 tablet 3  . Multiple Vitamin (MULTIVITAMIN WITH MINERALS) TABS tablet Take 1 tablet by mouth daily with lunch.    . non-metallic deodorant Jethro Poling) MISC Apply 1 application topically daily as needed.    . Omega-3 Fatty Acids (FISH OIL) 1000 MG CAPS Take 1,000 mg by mouth daily with lunch.    . Turmeric 450 MG CAPS Take 450 mg by mouth daily with lunch.     . vitamin C (  ASCORBIC ACID) 500 MG tablet Take 500 mg by mouth 2 (two) times daily.    Marland Kitchen LORazepam (ATIVAN) 0.5 MG tablet Take 1 tablet (0.5 mg total) by mouth every 6 (six) hours as needed (Nausea or vomiting). (Patient not taking: Reported on 08/03/2015) 30 tablet 0  . oxyCODONE-acetaminophen (PERCOCET) 10-325 MG per tablet Take 1 tablet by mouth every 6 (six) hours as needed for pain. (Patient not taking: Reported on 08/03/2015) 20 tablet 0   No current facility-administered medications on file prior to visit.     ONCOLOGIC FAMILY HISTORY:  Family History  Problem Relation Age of Onset  . Heart disease Neg Hx     family hx  . Cancer Neg Hx     lung ca  . COPD Neg Hx     family hx  . Breast cancer Paternal Grandmother 40  . Lung cancer Father 72    smoker  . Prostate cancer Cousin     paternal cousin      GENETIC COUNSELING/TESTING: None  SOCIAL HISTORY:  Shawna Marquez is married and lives with her spouse in [place],  [place].  She has 3 children, including her daughter, Erline Levine, who is a Marine scientist here at the [employer]  Ms. Trembath is currently not working.  She denies any current or history of tobacco or illicit drug use.  She reports occasional alcohol use.   PHYSICAL EXAMINATION:  Vital Signs:   Filed Vitals:   08/03/15 1348  BP: 133/78  Pulse: 81  Temp: 98.9 F (37.2 C)  Resp: 18   ECOG Performance status: 0 General: Well-nourished, well-appearing female in no acute distress.  She is unaccompanied in clinic today.   HEENT: Head is atraumatic and normocephalic.  Pupils equal and reactive to light and accomodation. Conjunctivae clear without exudate.  Sclerae anicteric. Oral mucosa is pink, moist, and intact without lesions.  Oropharynx is pink without lesions or erythema.  Lymph: No cervical, supraclavicular, infraclavicular, or axillary lymphadenopathy noted on palpation.  Cardiovascular: Regular rate and rhythm without murmurs, rubs, or gallops. Respiratory: Clear to auscultation bilaterally. Chest expansion symmetric without accessory muscle use on inspiration or expiration.  GI: Abdomen soft and round. No tenderness to palpation. Bowel sounds normoactive in 4 quadrants. No hepatosplenomegaly.   GU: Deferred.  Musculoskeletal: Muscle strength 5/5 in all extremities.  Full ROM noted in all extremities.  Neuro: No focal deficits. Steady gait.  Psych: Mood and affect normal and appropriate for situation.  Extremities: No edema, cyanosis, or clubbing.  Skin: Warm and dry. No open lesions noted.   LABORATORY DATA:  None for this visit.  DIAGNOSTIC IMAGING:  None for this visit.     ASSESSMENT AND PLAN:   1. History of breast cancer: Stage IIA invasive ductal carcinoma of the right breast, ER+/PR+, S/P treatment as outlined above, now on anastrozole as adjuvant endocrine therapy with no clinical symptoms worrisome for disease recurrence at this time.  Shawna Marquez will follow-up with her  medical oncologist,  Dr. Lindi Adie in September 2016 with history and physical exam per surveillance protocol.  She will continue her anti-estrogen therapy with anastrozole as prescribed by  Dr. Lindi Adie. She was instructed to make Dr. Lindi Adie or myself aware if she begins to experience any side effects of the medication and I could see her back in clinic to help manage those side effects, as needed. A comprehensive survivorship care plan and treatment summary was reviewed with the patient today detailing her breast cancer diagnosis, treatment  course, potential late/long-term effects of treatment, appropriate follow-up care with recommendations for the future, and patient education resources.  A copy of this summary, along with a letter will be sent to the patient's primary care provider via in basket message after today's visit.  Shawna Marquez is welcome to return to the Survivorship Clinic in the future, as needed; no follow-up will be scheduled at this time.    2. Bone health:  Given Shawna Marquez's age/history of breast cancer and her current treatment regimen including anti-estrogen therapy with anastrozole, she is at risk for bone demineralization.  Per her report, she will undergo repeat DEXA in 2017.  In the meantime, she was encouraged to increase her consumption of foods rich in calcium and vitamin D as well as to increase her weight-bearing activities.  She was given education on specific activities to promote bone health.  3 Cancer screening:  Due to Ms. Banton's history and her age, she should receive screening for skin cancers and colon cancer. She is S/P hysterectomy.  The information and recommendations are listed on the patient's comprehensive care plan/treatment summary and were reviewed in detail with the patient.    4. Health maintenance and wellness promotion: Shawna Marquez was encouraged to consume 5-7 servings of fruits and vegetables per day. We reviewed the "Nutrition Rainbow" handout, as well as the  handout about "Nutrition for Breast Cancer Survivors."  She was also encouraged to engage in moderate to vigorous exercise for 30 minutes per day most days of the week. We discussed the [employer] fitness program, which is designed for cancer survivors to help them become more physically fit after cancer treatments.  She was instructed to limit her alcohol consumption and continue to abstain from tobacco use.   5 Support services/counseling: It is not uncommon for this period of the patient's cancer care trajectory to be one of many emotions and stressors.  We discussed an opportunity for her to participate in the next session of [facility] ("Finding Your New Normal") support group series designed for patients after they have completed treatment.   Shawna Marquez was encouraged to take advantage of our many other support services programs, support groups, and/or counseling in coping with her new life as a cancer survivor after completing anti-cancer treatment.  She was offered support today through active listening and expressive supportive counseling.  She was given information regarding our available services and encouraged to contact me with any questions or for help enrolling in any of our support group/programs.    A total of 50 minutes of face-to-face time was spent with this patient with greater than 50% of that time in counseling and care-coordination.   Sylvan Cheese, NP  Survivorship Program [facility] [phone]   Note: PRIMARY CARE PROVIDER Nyoka Cowden, [place] [phone]

## 2015-08-30 ENCOUNTER — Encounter: Payer: Self-pay | Admitting: Hematology and Oncology

## 2015-08-30 ENCOUNTER — Ambulatory Visit (HOSPITAL_BASED_OUTPATIENT_CLINIC_OR_DEPARTMENT_OTHER): Payer: Medicare Other | Admitting: Hematology and Oncology

## 2015-08-30 VITALS — BP 129/77 | HR 87 | Temp 98.3°F | Resp 18 | Ht 64.0 in | Wt 174.7 lb

## 2015-08-30 DIAGNOSIS — C50211 Malignant neoplasm of upper-inner quadrant of right female breast: Secondary | ICD-10-CM

## 2015-08-30 NOTE — Assessment & Plan Note (Signed)
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, focal vascular involvement, posterior margin involvement, 1/2 sentinel node positive, T1 cN1 M0 stage II a, Mammaprint luminal type B, high risk, disease free survival with chemotherapy and hormonal therapy 88% versus 76% with hormonal therapy alone;   Treatment summary: Adjuvant chemotherapy with Taxotere and Cytoxan every 3 weeks 4 cycles started 01/15/15 completed 03/19/15, followed by adjuvant radiation therapy 04/10/15- 05/25/2015 Started anastrozole 5 years 05/31/2015  Anastrozole toxicities:  Breast Cancer Surveillance: 1. Breast exam every 4-6 months 2. Mammograms: Annually  Return to clinic in 6 months for follow-up.

## 2015-08-30 NOTE — Progress Notes (Signed)
Patient Care Team: Marletta Lor, MD as PCP - General Rolm Bookbinder, MD as Consulting Physician (General Surgery) Nicholas Lose, MD as Consulting Physician (Hematology and Oncology) Thea Silversmith, MD as Consulting Physician (Radiation Oncology) Holley Bouche, NP as Nurse Practitioner (Nurse Practitioner) Sylvan Cheese, NP as Nurse Practitioner (Nurse Practitioner)  DIAGNOSIS: 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 breast   Staging form: Breast, AJCC 7th Edition     Clinical stage from 11/22/2014: Stage IA (T1b, N0, M0) - Unsigned     Pathologic: Stage IIA (T1c, N1a, cM0) - Signed by Seward Grater, MD on 12/26/2014       Staging comments: Staged on final lumpectomy specimen read by Dr. Saralyn Pilar.    SUMMARY OF ONCOLOGIC HISTORY:   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 breast   06/07/1996 Miscellaneous History of Stage IIB left breast cancer S/P mastectomy and chemotherapy (Adriamycin and Cytoxan) on CALGB 9397.   11/10/2014 Mammogram Right breast: 9 mm mass    11/13/2014 Breast US Right breast: 9 mm mass at 1:00 position with indistinct margin, middle depth, hypoechoic.   11/15/2014 Initial Biopsy Right breast needle biopsy: Invasive ductal carcinoma, grade 2, with DCIS with lymphovascular invasion ER+ (100%), PR+ (100%), HER-2/neu negative (ratio 1.87), Ki-67 23%   11/15/2014 Clinical Stage Stage IA; T1b N0   11/28/2014 Definitive Surgery Right breast lumpectomy with SLNB Donne Hazel): Invasive ductal carcinoma, grade 3, 1.7 cm, with DCIS, focal vascular involvement, posterior margin involvement, 1/2 sentinel nodes positive for malignancy, repeat HER2/neu negative (ratio 1.14)   11/28/2014 Procedure Mammaprint: High Risk Luminal Type B.    11/28/2014 Pathologic Stage Stage IIA: pT1c, pN1a   01/01/2015 Surgery Rexcision of right breast margin: surgical margins negative for malignancy   01/15/2015 - 03/20/2015 Chemotherapy Adjuvant chemotherapy  with Taxotere and Cyclophosphamide X 4 cycles (Deborra Phegley)   04/10/2015 - 05/25/2015 Radiation Therapy Adjuvant XRT completed Pablo Ledger): Right breast 45 Gy over 25 fractions; Right breast boost 16 Gy at 2 Gy over 8 fractions. Total dose 61 Gy   05/31/2015 -  Anti-estrogen oral therapy Anastrozole 1 mg daily (Nishanth Mccaughan).  Planned duration of therapy: 5 years.   08/03/2015 Survivorship Survivorship visit completed and copy of survivorship care plan provided to patient.    CHIEF COMPLIANT: Arthralgias, difficulty with sleeping  INTERVAL HISTORY: HERA CELAYA is a 74 year old with above-mentioned history of recurrent breast cancer currently on antiestrogen therapy with anastrozole. Overall she is tolerating it fairly well but complains of arthralgias. Muscle aches and pains and some tingling of the feet. She is also complaining of difficulty falling asleep. She attributes these symptoms to anastrozole. Denies much hot flashes but occasionally she feels warm on the inside.  REVIEW OF SYSTEMS:   Constitutional: Denies fevers, chills or abnormal weight loss Eyes: Denies blurriness of vision Ears, nose, mouth, throat, and face: Denies mucositis or sore throat Respiratory: Denies cough, dyspnea or wheezes Cardiovascular: Denies palpitation, chest discomfort or lower extremity swelling Gastrointestinal:  Denies nausea, heartburn or change in bowel habits Skin: Denies abnormal skin rashes Lymphatics: Denies new lymphadenopathy or easy bruising Neurological:Denies numbness, tingling or new weaknesses Behavioral/Psych: Mood is stable, no new changes  Breast:  denies any pain or lumps or nodules in either breasts All other systems were reviewed with the patient and are negative.  I have reviewed the past medical history, past surgical history, social history and family history with the patient and they are unchanged from previous note.  ALLERGIES:  has No Known Allergies.  MEDICATIONS:  Current Outpatient  Prescriptions  Medication Sig Dispense Refill  . anastrozole (ARIMIDEX) 1 MG tablet Take 1 tablet (1 mg total) by mouth daily. 90 tablet 3  . aspirin EC 81 MG tablet Take 81 mg by mouth at bedtime.    . Calcium Citrate-Vitamin D (CALCIUM CITRATE + D3 PO) Take 1 tablet by mouth 2 (two) times daily. Calcium 500 mg, Vitamin D 800 units    . cholecalciferol (VITAMIN D) 1000 UNITS tablet Take 1,000 Units by mouth daily with lunch.     . Coenzyme Q10 (COQ10) 200 MG CAPS Take 200 mg by mouth every other day.    . fluticasone (FLONASE) 50 MCG/ACT nasal spray INSTILL 1 SPRAY INTO EACH NOSTRIL TWICE DAILY 48 g 3  . folic acid (FOLVITE) 740 MCG tablet Take 400 mcg by mouth daily with lunch.     . hydrocortisone cream 1 % Apply 1 application topically daily as needed for itching (eczema).    Marland Kitchen levothyroxine (SYNTHROID, LEVOTHROID) 125 MCG tablet Take 1 tablet (125 mcg total) by mouth daily before breakfast. 90 tablet 3  . LORazepam (ATIVAN) 0.5 MG tablet Take 1 tablet (0.5 mg total) by mouth every 6 (six) hours as needed (Nausea or vomiting). (Patient not taking: Reported on 08/03/2015) 30 tablet 0  . Multiple Vitamin (MULTIVITAMIN WITH MINERALS) TABS tablet Take 1 tablet by mouth daily with lunch.    . non-metallic deodorant Jethro Poling) MISC Apply 1 application topically daily as needed.    . Omega-3 Fatty Acids (FISH OIL) 1000 MG CAPS Take 1,000 mg by mouth daily with lunch.    . oxyCODONE-acetaminophen (PERCOCET) 10-325 MG per tablet Take 1 tablet by mouth every 6 (six) hours as needed for pain. (Patient not taking: Reported on 08/03/2015) 20 tablet 0  . Turmeric 450 MG CAPS Take 450 mg by mouth daily with lunch.     . vitamin C (ASCORBIC ACID) 500 MG tablet Take 500 mg by mouth 2 (two) times daily.     No current facility-administered medications for this visit.    PHYSICAL EXAMINATION: ECOG PERFORMANCE STATUS: 1 - Symptomatic but completely ambulatory  Filed Vitals:   08/30/15 1115  BP: 129/77  Pulse:  87  Temp: 98.3 F (36.8 C)  Resp: 18   Filed Weights   08/30/15 1115  Weight: 174 lb 11.2 oz (79.243 kg)    GENERAL:alert, no distress and comfortable SKIN: skin color, texture, turgor are normal, no rashes or significant lesions EYES: normal, Conjunctiva are pink and non-injected, sclera clear OROPHARYNX:no exudate, no erythema and lips, buccal mucosa, and tongue normal  NECK: supple, thyroid normal size, non-tender, without nodularity LYMPH:  no palpable lymphadenopathy in the cervical, axillary or inguinal LUNGS: clear to auscultation and percussion with normal breathing effort HEART: regular rate & rhythm and no murmurs and no lower extremity edema ABDOMEN:abdomen soft, non-tender and normal bowel sounds Musculoskeletal:no cyanosis of digits and no clubbing  NEURO: alert & oriented x 3 with fluent speech, no focal motor/sensory deficits  LABORATORY DATA:  I have reviewed the data as listed   Chemistry      Component Value Date/Time   NA 140 05/31/2015 0830   NA 142 12/28/2014 1533   K 4.1 05/31/2015 0830   K 4.1 12/28/2014 1533   CL 104 12/28/2014 1533   CO2 28 05/31/2015 0830   CO2 30 12/28/2014 1533   BUN 15.4 05/31/2015 0830   BUN 11 12/28/2014 1533   CREATININE  0.6 05/31/2015 0830   CREATININE 0.76 12/28/2014 1533      Component Value Date/Time   CALCIUM 8.8 05/31/2015 0830   CALCIUM 9.3 12/28/2014 1533   ALKPHOS 82 05/31/2015 0830   ALKPHOS 67 02/24/2014 1013   AST 22 05/31/2015 0830   AST 33 02/24/2014 1013   ALT 16 05/31/2015 0830   ALT 31 02/24/2014 1013   BILITOT 0.31 05/31/2015 0830   BILITOT 0.7 02/24/2014 1013       Lab Results  Component Value Date   WBC 3.9 05/31/2015   HGB 12.0 05/31/2015   HCT 37.2 05/31/2015   MCV 95.6 05/31/2015   PLT 184 05/31/2015   NEUTROABS 2.6 05/31/2015   ASSESSMENT & PLAN:  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 breast 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, focal vascular  involvement, posterior margin involvement, 1/2 sentinel node positive, T1 cN1 M0 stage II a, Mammaprint luminal type B, high risk, disease free survival with chemotherapy and hormonal therapy 88% versus 76% with hormonal therapy alone;   Treatment summary: Adjuvant chemotherapy with Taxotere and Cytoxan every 3 weeks 4 cycles started 01/15/15 completed 03/19/15, followed by adjuvant radiation therapy 04/10/15- 05/25/2015 Started anastrozole 5 years 05/31/2015  Anastrozole toxicities: Very occasional hot flashes Occasional myalgias and arthralgias: Instructed her to take small amount of tonic water at bedtime. I suspect it may be related to neuropathy related to Taxotere.  Difficulty falling asleep at night: I instructed her to take anastrozole in the morning.  Breast Cancer Surveillance: 1. Breast exam every 4-6 months 2. Mammograms: Annually  Return to clinic in 6 months for follow-up.   No orders of the defined types were placed in this encounter.   The patient has a good understanding of the overall plan. she agrees with it. she will call with any problems that may develop before the next visit here.   Rulon Eisenmenger, MD

## 2015-08-31 ENCOUNTER — Telehealth: Payer: Self-pay | Admitting: Oncology

## 2015-08-31 NOTE — Telephone Encounter (Signed)
Mailed calendar for March 2017 °

## 2015-09-27 ENCOUNTER — Ambulatory Visit (INDEPENDENT_AMBULATORY_CARE_PROVIDER_SITE_OTHER): Payer: Medicare Other | Admitting: *Deleted

## 2015-09-27 DIAGNOSIS — Z23 Encounter for immunization: Secondary | ICD-10-CM

## 2015-11-22 DIAGNOSIS — Z853 Personal history of malignant neoplasm of breast: Secondary | ICD-10-CM | POA: Diagnosis not present

## 2015-11-22 LAB — HM MAMMOGRAPHY

## 2015-12-07 ENCOUNTER — Encounter: Payer: Self-pay | Admitting: *Deleted

## 2015-12-07 NOTE — Progress Notes (Signed)
Received mammo report from [facility], reviewed by Dr. Lindi Adie, sent to scan.

## 2015-12-20 ENCOUNTER — Encounter: Payer: Self-pay | Admitting: Internal Medicine

## 2015-12-31 ENCOUNTER — Telehealth: Payer: Self-pay | Admitting: *Deleted

## 2015-12-31 NOTE — Telephone Encounter (Signed)
Shawna Marquez called to report that one of her tatoo markings site for radiation has started looking like a mole and was this normal.  I told Shawna Marquez that the skin on the tatoo marking area could develop the appearance of a mole after radiation.  Mentioned that she could see her dermatologist if she felt like she needed an assessment if she starts to have any irritation.

## 2016-01-01 ENCOUNTER — Telehealth: Payer: Self-pay | Admitting: *Deleted

## 2016-01-01 NOTE — Telephone Encounter (Signed)
That sounds weird.  Can you call her in 2 weeks and see what it looks like?  If has changed any over those 2 weeks, I need to see her. Thanks!

## 2016-01-01 NOTE — Telephone Encounter (Signed)
Spoke with Mrs. Cacho re; tatoo markings that has the mole told her that Dr. Pablo Ledger wants to have her observe the area over the next two weeks if no improvement then she will have her come for an assessment of the area around February 01-14-16.

## 2016-01-09 ENCOUNTER — Encounter: Payer: Self-pay | Admitting: Adult Health

## 2016-01-09 NOTE — Progress Notes (Signed)
A birthday card was mailed to the patient today on behalf of the Survivorship Program at [facility].   Jairo Bellew, NP Survivorship Program [facility] [phone]  

## 2016-01-14 NOTE — Telephone Encounter (Addendum)
Follow -up call to see how the tatoo marking was looking since we spoke two weeks ago.  She stated " I can not see any difference in the appearance of the mole."  I have an appointment with Shawna Marquez tomorrow for him to evaluate the mole.  I mentioned that Dr. Pablo Ledger wants to see her if the tatoo marking does not look better so she can evaluate the area.  Shawna Marquez said she would call me back tomorrow to report Dr. Lolita Rieger  recommendation about the mole.

## 2016-01-15 ENCOUNTER — Ambulatory Visit (INDEPENDENT_AMBULATORY_CARE_PROVIDER_SITE_OTHER): Payer: Medicare Other | Admitting: Internal Medicine

## 2016-01-15 ENCOUNTER — Encounter: Payer: Self-pay | Admitting: Internal Medicine

## 2016-01-15 VITALS — BP 126/70 | HR 74 | Temp 98.1°F | Resp 20 | Ht 60.25 in | Wt 173.0 lb

## 2016-01-15 DIAGNOSIS — Z0000 Encounter for general adult medical examination without abnormal findings: Secondary | ICD-10-CM

## 2016-01-15 DIAGNOSIS — E039 Hypothyroidism, unspecified: Secondary | ICD-10-CM

## 2016-01-15 DIAGNOSIS — M159 Polyosteoarthritis, unspecified: Secondary | ICD-10-CM

## 2016-01-15 DIAGNOSIS — Z853 Personal history of malignant neoplasm of breast: Secondary | ICD-10-CM

## 2016-01-15 DIAGNOSIS — E2839 Other primary ovarian failure: Secondary | ICD-10-CM | POA: Diagnosis not present

## 2016-01-15 DIAGNOSIS — M150 Primary generalized (osteo)arthritis: Secondary | ICD-10-CM

## 2016-01-15 DIAGNOSIS — E785 Hyperlipidemia, unspecified: Secondary | ICD-10-CM

## 2016-01-15 LAB — LIPID PANEL
CHOL/HDL RATIO: 3
Cholesterol: 173 mg/dL (ref 0–200)
HDL: 56 mg/dL (ref >39.00)
LDL Cholesterol: 96 mg/dL (ref 0–99)
NonHDL: 117.2
TRIGLYCERIDES: 108 mg/dL (ref 0.0–149.0)
VLDL: 21.6 mg/dL (ref 0.0–40.0)

## 2016-01-15 LAB — TSH: TSH: 0.26 u[IU]/mL — ABNORMAL LOW (ref 0.35–4.50)

## 2016-01-15 MED ORDER — FLUTICASONE PROPIONATE 50 MCG/ACT NA SUSP: NASAL | Status: DC

## 2016-01-15 MED ORDER — LEVOTHYROXINE SODIUM 125 MCG PO TABS: 125.0000 ug | ORAL_TABLET | Freq: Every day | ORAL | Status: DC

## 2016-01-15 NOTE — Progress Notes (Signed)
Patient ID: Shawna Marquez, female   DOB: 11/17/1941, 75 y.o.   MRN: [id number]  Subjective:    Patient ID: Shawna Marquez, female    DOB: December 19, 1940, 75 y.o.   MRN: [id number]  HPI  75 year-old patient who is seen today for a wellness exam.  She does quite well. He did have a colonoscopy in 2006. She has a history of left mastectomy for breast cancer 19  years ago. She has had bilateral cataract extraction surgery. She is followed closely by oncology for right breast cancer diagnosed December 2015.  Medical regimen includes Arimidex.  Last bone density study 4 years ago  Past Medical History  Diagnosis Date  . ALLERGIC RHINITIS 10/12/2007  . BREAST CANCER, HX OF 10/12/2007    right breast diagnosed - 11/2014  . DIVERTICULITIS, HX OF 10/12/2007  . HYPOTHYROIDISM, PRIMARY 10/12/2007  . Cancer of upper-inner quadrant of female breast ([place]) 11/17/2014  . Wears glasses   . Family history of breast cancer   . Complication of anesthesia     bp will drop,hard to wake up  . Sleep apnea     USES cpap NIGHTLY  . OSTEOARTHRITIS 09/09/2007    back   . Radiation 04/10/15-05/25/15    Right Breast    Social History   Social History  . Marital Status: Married    Spouse Name: N/A  . Number of Children: 3  . Years of Education: N/A   Occupational History  . Not on file.   Social History Main Topics  . Smoking status: Never Smoker   . Smokeless tobacco: Never Used  . Alcohol Use: 0.0 oz/week    0 Standard drinks or equivalent per week     Comment: occassional beer  . Drug Use: No  . Sexual Activity: Not on file   Other Topics Concern  . Not on file   Social History Narrative    Past Surgical History  Procedure Laterality Date  . Colovaginal fistula  2007    colon resection  . Abdominal hysterectomy  1977  . Hernia repair  2007    ventral  . Colon surgery  2006    part colectomy  . Mastectomy  1997    lt mast-axillary node dissection  . Tonsillectomy    . Colonoscopy  2007  .  Wisdom tooth extraction    . Eye surgery      both cataracts  . Radioactive seed guided mastectomy with axillary sentinel lymph node biopsy Right 11/28/2014    Procedure: RADIOACTIVE SEED GUIDED RIGHT LUMPECTOMY WITH RIGHT AXILLARY SENTINEL LYMPH NODE BIOPSY;  Surgeon: Rolm Bookbinder, MD;  Location: [facility];  Service: General;  Laterality: Right;  . Re-excision of breast cancer,superior margins Right 01/01/2015    Procedure: RIGHT BREAST MARGIN EXCISION;  Surgeon: Rolm Bookbinder, MD;  Location: [facility];  Service: General;  Laterality: Right;  . Portacath placement Left 01/01/2015    Procedure: INSERTION PORT-A-CATH;  Surgeon: Rolm Bookbinder, MD;  Location: [facility] OR;  Service: General;  Laterality: Left;    Family History  Problem Relation Age of Onset  . Heart disease Neg Hx     family hx  . Cancer Neg Hx     lung ca  . COPD Neg Hx     family hx  . Breast cancer Paternal Grandmother 36  . Lung cancer Father 18    smoker  . Prostate cancer Cousin     paternal cousin     No  Known Allergies  Current Outpatient Prescriptions on File Prior to Visit  Medication Sig Dispense Refill  . anastrozole (ARIMIDEX) 1 MG tablet Take 1 tablet (1 mg total) by mouth daily. 90 tablet 3  . aspirin EC 81 MG tablet Take 81 mg by mouth at bedtime.    . Calcium Citrate-Vitamin D (CALCIUM CITRATE + D3 PO) Take 1 tablet by mouth 2 (two) times daily. Calcium 500 mg, Vitamin D 800 units    . cholecalciferol (VITAMIN D) 1000 UNITS tablet Take 1,000 Units by mouth daily with lunch.     . Coenzyme Q10 (COQ10) 200 MG CAPS Take 200 mg by mouth every other day.    . folic acid (FOLVITE) A999333 MCG tablet Take 400 mcg by mouth daily with lunch.     . hydrocortisone cream 1 % Apply 1 application topically daily as needed for itching (eczema).    . LORazepam (ATIVAN) 0.5 MG tablet Take 1 tablet (0.5 mg total) by mouth every 6 (six) hours as needed (Nausea or vomiting). 30 tablet 0  . Multiple  Vitamin (MULTIVITAMIN WITH MINERALS) TABS tablet Take 1 tablet by mouth daily with lunch.    . non-metallic deodorant Jethro Poling) MISC Apply 1 application topically daily as needed.    . Omega-3 Fatty Acids (FISH OIL) 1000 MG CAPS Take 1,000 mg by mouth daily with lunch.    . Turmeric 450 MG CAPS Take 450 mg by mouth daily with lunch.     . vitamin C (ASCORBIC ACID) 500 MG tablet Take 500 mg by mouth 2 (two) times daily.     No current facility-administered medications on file prior to visit.    BP 126/70 mmHg  Pulse 74  Temp(Src) 98.1 F (36.7 C) (Oral)  Resp 20  Ht 5' 0.25" (1.53 m)  Wt 173 lb (78.472 kg)  BMI 33.52 kg/m2  SpO2 98%       Here for Medicare AWV:   1. Risk factors based on Past M, S, F history: Cardiovascular risk factors none , except age 48. Physical Activities: remains fairly active without exercise limitations  3. Depression/mood: history depression, or mood disorder  4. Hearing: no hearing deficits  5. ADL's: independent in all aspects of daily living  6. Fall Risk: low  7. Home Safety: no problems identified  8. Height, weight, &visual acuity:height and weight stable; planning on right Extraction surgery next month  9. Counseling: heart healthy diet modest weight loss, and more regular exercise regimen. All encouraged  10. Labs ordered based on risk factors: laboratory Profile will be reviewed  11. Referral Coordination- none appropriate at this time. Will need follow-up colonoscopy January 2016  12. Care Plan- calcium and vitamin D supplementation. Encouraged  13. Cognitive Assessment- alert and oriented, with normal affect. No cognitive dysfunction  14.  Preventive services will include annual clinical examinations with screening lab.  The patient will continue close oncological follow-up.  A bone density study will be reviewed.  Patient be consider for follow-up colonoscopy 15.  Provider list includes gynecology primary care GI and radiology  Allergies  (verified):  No Known Drug Allergies   Past History:  Past Medical History:    Osteoarthritis  Breast cancer, hx of stage IIb 1997 , status post left mastectomy  Breast cancer, right December 2015 Diverticulitis, hx of  Allergic rhinitis  obstructive sleep apnea   Past Surgical History:    Colonoscopy January 2006  Mastectomy 1997  Hysterectomy 1975  correction of a colovaginal fistula. April 2007  repair of a ventral hernia, December 2007  Bilateral cataract extraction surgery  Family History:    Family History of DJD  Family History of DVT  Family History of CAD Female 1st degree relative <50  Family History Lung cancer  Family History of Cardiovascular disorder  Family History of Respiratory disease  father died age 50 lung cancer, history of COPD, coronary artery disease, status post CABG  mother died age 105, history of COPD, peripheral neuropathy, deep vein thrombosis  No siblings  Social History:   Retired  Married  Never Smoked   Review of Systems  Constitutional: Negative for fever, appetite change, fatigue and unexpected weight change.  HENT: Negative for congestion, dental problem, ear pain, hearing loss, mouth sores, nosebleeds, sinus pressure, sore throat, tinnitus, trouble swallowing and voice change.   Eyes: Negative for photophobia, pain, redness and visual disturbance.  Respiratory: Negative for cough, chest tightness and shortness of breath.   Cardiovascular: Negative for chest pain, palpitations and leg swelling.  Gastrointestinal: Negative for nausea, vomiting, abdominal pain, diarrhea, constipation, blood in stool, abdominal distention and rectal pain.  Genitourinary: Negative for dysuria, urgency, frequency, hematuria, flank pain, vaginal bleeding, vaginal discharge, difficulty urinating, genital sores, vaginal pain, menstrual problem and pelvic pain.  Musculoskeletal: Negative for back pain, arthralgias and neck stiffness.  Skin: Negative for  rash.  Neurological: Negative for dizziness, syncope, speech difficulty, weakness, light-headedness, numbness and headaches.  Hematological: Negative for adenopathy. Does not bruise/bleed easily.  Psychiatric/Behavioral: Negative for suicidal ideas, behavioral problems, self-injury, dysphoric mood and agitation. The patient is not nervous/anxious.    and     Objective:   Physical Exam  Constitutional: She is oriented to person, place, and time. She appears well-developed and well-nourished.  HENT:  Head: Normocephalic and atraumatic.  Right Ear: External ear normal.  Left Ear: External ear normal.  Mouth/Throat: Oropharynx is clear and moist.  Eyes: Conjunctivae and EOM are normal.  Neck: Normal range of motion. Neck supple. No JVD present. No thyromegaly present.  Cardiovascular: Normal rate, regular rhythm, normal heart sounds and intact distal pulses.   No murmur heard. Pulmonary/Chest: Effort normal and breath sounds normal. She has no wheezes. She has no rales.  Status post left mastectomy Right breast status post radiation changes with some thickened indurated skin Axilla clear  Abdominal: Soft. Bowel sounds are normal. She exhibits no distension and no mass. There is no tenderness. There is no rebound and no guarding.  Musculoskeletal: Normal range of motion. She exhibits no edema or tenderness.  Neurological: She is alert and oriented to person, place, and time. She has normal reflexes. No cranial nerve deficit. She exhibits normal muscle tone. Coordination normal.  Skin: Skin is warm and dry. No rash noted.  Feet are dry and scaly  Psychiatric: She has a normal mood and affect. Her behavior is normal.          Assessment & Plan:   Preventive health examination. Will review screening lab Osteoarthritis stable Hypothyroidism. We'll check a TSH Mild osteopenia. We'll check a vitamin D level.  Follow-up bone density study will be reviewed History right breast cancer  December 2015.  Close oncological follow-up  Recheck one year

## 2016-01-15 NOTE — Progress Notes (Signed)
Pre visit review using our clinic review tool, if applicable. No additional management support is needed unless otherwise documented below in the visit note. 

## 2016-01-15 NOTE — Patient Instructions (Signed)
Take a calcium supplement, plus [phone] units of vitamin D  Schedule your colonoscopy to help detect colon cancer.    It is important that you exercise regularly, at least 20 minutes 3 to 4 times per week.  If you develop chest pain or shortness of breath seek  medical attention.  Bone density study as scheduled

## 2016-01-16 ENCOUNTER — Telehealth: Payer: Self-pay | Admitting: *Deleted

## 2016-01-16 NOTE — Telephone Encounter (Signed)
Mrs. Dominigue Kenison. [place] called this morning  to report that Dr. Tamala Fothergill said her mole was okay.

## 2016-01-24 ENCOUNTER — Telehealth: Payer: Self-pay | Admitting: Internal Medicine

## 2016-01-24 MED ORDER — AZITHROMYCIN 250 MG PO TABS: ORAL_TABLET | ORAL | Status: DC

## 2016-01-24 NOTE — Telephone Encounter (Signed)
Please see message and advise 

## 2016-01-24 NOTE — Telephone Encounter (Signed)
ok 

## 2016-01-24 NOTE — Telephone Encounter (Signed)
Pt states her grandson has been diagnosed with whooping cough and she was with him over the weekend and exposed.  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  Pt is calling to request rx for Z-Pak send to [facility] on [address] in [place].

## 2016-01-24 NOTE — Telephone Encounter (Signed)
Pt notified Rx sent to pharmacy for [place]. Pt verbalized understanding.

## 2016-02-05 DIAGNOSIS — C50419 Malignant neoplasm of upper-outer quadrant of unspecified female breast: Secondary | ICD-10-CM | POA: Diagnosis not present

## 2016-02-08 ENCOUNTER — Other Ambulatory Visit: Payer: Self-pay | Admitting: Internal Medicine

## 2016-02-08 ENCOUNTER — Encounter: Payer: Self-pay | Admitting: Internal Medicine

## 2016-02-08 DIAGNOSIS — M810 Age-related osteoporosis without current pathological fracture: Secondary | ICD-10-CM

## 2016-02-22 DIAGNOSIS — H40012 Open angle with borderline findings, low risk, left eye: Secondary | ICD-10-CM | POA: Diagnosis not present

## 2016-02-22 DIAGNOSIS — H52203 Unspecified astigmatism, bilateral: Secondary | ICD-10-CM | POA: Diagnosis not present

## 2016-02-22 DIAGNOSIS — H02831 Dermatochalasis of right upper eyelid: Secondary | ICD-10-CM | POA: Diagnosis not present

## 2016-02-22 DIAGNOSIS — H40011 Open angle with borderline findings, low risk, right eye: Secondary | ICD-10-CM | POA: Diagnosis not present

## 2016-02-25 ENCOUNTER — Ambulatory Visit (AMBULATORY_SURGERY_CENTER): Payer: Self-pay | Admitting: *Deleted

## 2016-02-25 VITALS — Ht 61.0 in | Wt 175.4 lb

## 2016-02-25 DIAGNOSIS — Z1211 Encounter for screening for malignant neoplasm of colon: Secondary | ICD-10-CM

## 2016-02-25 NOTE — Progress Notes (Signed)
Denies allergies to eggs or soy products. Denies complications with sedation or anesthesia. Denies O2 use. Denies use of diet or weight loss medications.  Emmi instructions given for colonoscopy.  

## 2016-02-26 ENCOUNTER — Encounter: Payer: Self-pay | Admitting: Internal Medicine

## 2016-02-27 NOTE — Assessment & Plan Note (Signed)
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, focal vascular involvement, posterior margin involvement, 1/2 sentinel node positive, T1 cN1 M0 stage II a, Mammaprint luminal type B, high risk, disease free survival with chemotherapy and hormonal therapy 88% versus 76% with hormonal therapy alone;   Treatment summary: Adjuvant chemotherapy with Taxotere and Cytoxan every 3 weeks 4 cycles started 01/15/15 completed 03/19/15, followed by adjuvant radiation therapy 04/10/15- 05/25/2015 Started anastrozole 5 years 05/31/2015  Anastrozole toxicities: Very occasional hot flashes Occasional myalgias and arthralgias: Instructed her to take small amount of tonic water at bedtime. I suspect it may be related to neuropathy related to Taxotere.  Difficulty falling asleep at night: I instructed her to take anastrozole in the morning.  Breast Cancer Surveillance: 1. Breast exam every 02/28/16: No abnormalities 2. Mammograms: Annually  Return to clinic in 6 months for follow-up.

## 2016-02-28 ENCOUNTER — Encounter: Payer: Self-pay | Admitting: Hematology and Oncology

## 2016-02-28 ENCOUNTER — Telehealth: Payer: Self-pay | Admitting: Hematology and Oncology

## 2016-02-28 ENCOUNTER — Ambulatory Visit (HOSPITAL_BASED_OUTPATIENT_CLINIC_OR_DEPARTMENT_OTHER): Payer: Medicare Other | Admitting: Hematology and Oncology

## 2016-02-28 VITALS — HR 75 | Temp 98.1°F | Resp 18 | Wt 175.0 lb

## 2016-02-28 DIAGNOSIS — C50211 Malignant neoplasm of upper-inner quadrant of right female breast: Secondary | ICD-10-CM

## 2016-02-28 DIAGNOSIS — Z170 Estrogen receptor positive status [ER+]: Secondary | ICD-10-CM | POA: Diagnosis not present

## 2016-02-28 DIAGNOSIS — Z79811 Long term (current) use of aromatase inhibitors: Secondary | ICD-10-CM

## 2016-02-28 NOTE — Telephone Encounter (Signed)
Left message for patient to inform her of her 6 month follow up appt

## 2016-02-28 NOTE — Progress Notes (Signed)
Unable to get in to exam room prior to MD.  No assessment performed.  

## 2016-02-28 NOTE — Progress Notes (Signed)
Patient Care Team: Marletta Lor, MD as PCP - General Rolm Bookbinder, MD as Consulting Physician (General Surgery) Nicholas Lose, MD as Consulting Physician (Hematology and Oncology) Thea Silversmith, MD as Consulting Physician (Radiation Oncology) Holley Bouche, NP as Nurse Practitioner (Nurse Practitioner) Sylvan Cheese, NP as Nurse Practitioner (Nurse Practitioner)  DIAGNOSIS: 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 breast [facility])   Staging form: Breast, AJCC 7th Edition     Clinical stage from 11/22/2014: Stage IA (T1b, N0, M0) - Unsigned     Pathologic: Stage IIA (T1c, N1a, cM0) - Signed by Seward Grater, MD on 12/26/2014       Staging comments: Staged on final lumpectomy specimen read by Dr. Saralyn Pilar.    SUMMARY OF ONCOLOGIC HISTORY:   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 breast ([place])   06/07/1996 Miscellaneous History of Stage IIB left breast cancer S/P mastectomy and chemotherapy (Adriamycin and Cytoxan) on CALGB 9397.   11/10/2014 Mammogram Right breast: 9 mm mass    11/13/2014 Breast US Right breast: 9 mm mass at 1:00 position with indistinct margin, middle depth, hypoechoic.   11/15/2014 Initial Biopsy Right breast needle biopsy: Invasive ductal carcinoma, grade 2, with DCIS with lymphovascular invasion ER+ (100%), PR+ (100%), HER-2/neu negative (ratio 1.87), Ki-67 23%   11/15/2014 Clinical Stage Stage IA; T1b N0   11/28/2014 Definitive Surgery Right breast lumpectomy with SLNB Donne Hazel): Invasive ductal carcinoma, grade 3, 1.7 cm, with DCIS, focal vascular involvement, posterior margin involvement, 1/2 sentinel nodes positive for malignancy, repeat HER2/neu negative (ratio 1.14)   11/28/2014 Procedure Mammaprint: High Risk Luminal Type B.    11/28/2014 Pathologic Stage Stage IIA: pT1c, pN1a   01/01/2015 Surgery Rexcision of right breast margin: surgical margins negative for malignancy   01/15/2015 - 03/20/2015 Chemotherapy Adjuvant  chemotherapy with Taxotere and Cyclophosphamide X 4 cycles (Parthenia Tellefsen)   04/10/2015 - 05/25/2015 Radiation Therapy Adjuvant XRT completed Pablo Ledger): Right breast 45 Gy over 25 fractions; Right breast boost 16 Gy at 2 Gy over 8 fractions. Total dose 61 Gy   05/31/2015 -  Anti-estrogen oral therapy Anastrozole 1 mg daily (Hurley Blevins).  Planned duration of therapy: 5 years.   08/03/2015 Survivorship Survivorship visit completed and copy of survivorship care plan provided to patient.    CHIEF COMPLIANT: Follow-up on anastrozole therapy, complains of neuropathy  INTERVAL HISTORY: Shawna Marquez is a 75 year old with above-mentioned history of left breast cancer and more recently right breast cancer underwent lumpectomy followed by adjuvant chemotherapy with Taxotere and Cytoxan followed by radiation and is currently on anastrozole therapy. She appears to be tolerating anastrozole much better since we switched her from evening to morning time. She is able to sleep better. She is able to function better. Although she does complain of stiffness when she wakes up every morning. She does have very occasional hot flashes. She denies any other complaints. She denies any lumps or nodules in the breasts. She does notice neuropathy on her feet bilaterally. It has not bothered her too much unless she stands on her feet for long hours. She does it is getting better with time.  REVIEW OF SYSTEMS:   Constitutional: Denies fevers, chills or abnormal weight loss Eyes: Denies blurriness of vision Ears, nose, mouth, throat, and face: Denies mucositis or sore throat Respiratory: Denies cough, dyspnea or wheezes Cardiovascular: Denies palpitation, chest discomfort Gastrointestinal:  Denies nausea, heartburn or change in bowel habits Skin: Denies abnormal skin rashes Lymphatics: Denies new lymphadenopathy or  easy bruising Neurological: Neuropathy in the feet Behavioral/Psych: Mood is stable, no new changes  Extremities: No lower  extremity edema Breast:  denies any pain or lumps or nodules in either breasts All other systems were reviewed with the patient and are negative.  I have reviewed the past medical history, past surgical history, social history and family history with the patient and they are unchanged from previous note.  ALLERGIES:  has No Known Allergies.  MEDICATIONS:  Current Outpatient Prescriptions  Medication Sig Dispense Refill  . anastrozole (ARIMIDEX) 1 MG tablet Take 1 tablet (1 mg total) by mouth daily. 90 tablet 3  . aspirin EC 81 MG tablet Take 81 mg by mouth at bedtime. Reported on 02/25/2016    . Calcium Citrate-Vitamin D (CALCIUM CITRATE + D3 PO) Take 1 tablet by mouth 2 (two) times daily. Calcium 500 mg, Vitamin D 800 units    . cetirizine (ZYRTEC) 10 MG tablet Take 10 mg by mouth daily.    . cholecalciferol (VITAMIN D) 1000 UNITS tablet Take 1,000 Units by mouth daily with lunch.     . Coenzyme Q10 (COQ10) 200 MG CAPS Take 200 mg by mouth every other day.    . fluticasone (FLONASE) 50 MCG/ACT nasal spray INSTILL 1 SPRAY INTO EACH NOSTRIL TWICE DAILY 48 g 3  . folic acid (FOLVITE) 400 MCG tablet Take 400 mcg by mouth daily with lunch.     . hydrocortisone cream 1 % Apply 1 application topically daily as needed for itching (eczema).    Marland Kitchen levothyroxine (SYNTHROID, LEVOTHROID) 125 MCG tablet Take 1 tablet (125 mcg total) by mouth daily before breakfast. 90 tablet 3  . Multiple Vitamin (MULTIVITAMIN WITH MINERALS) TABS tablet Take 1 tablet by mouth daily with lunch.    . non-metallic deodorant Thornton Papas) MISC Apply 1 application topically daily as needed.    . Omega-3 Fatty Acids (FISH OIL) 1000 MG CAPS Take 1,000 mg by mouth daily with lunch.    . Turmeric 450 MG CAPS Take 450 mg by mouth daily with lunch.     . vitamin C (ASCORBIC ACID) 500 MG tablet Take 500 mg by mouth 2 (two) times daily.     No current facility-administered medications for this visit.    PHYSICAL EXAMINATION: ECOG  PERFORMANCE STATUS: 0 - Asymptomatic  Filed Vitals:   02/28/16 1039  Pulse: 75  Temp: 98.1 F (36.7 C)  Resp: 18   Filed Weights   02/28/16 1039  Weight: 175 lb (79.379 kg)    GENERAL:alert, no distress and comfortable SKIN: skin color, texture, turgor are normal, no rashes or significant lesions EYES: normal, Conjunctiva are pink and non-injected, sclera clear OROPHARYNX:no exudate, no erythema and lips, buccal mucosa, and tongue normal  NECK: supple, thyroid normal size, non-tender, without nodularity LYMPH:  no palpable lymphadenopathy in the cervical, axillary or inguinal LUNGS: clear to auscultation and percussion with normal breathing effort HEART: regular rate & rhythm and no murmurs and no lower extremity edema ABDOMEN:abdomen soft, non-tender and normal bowel sounds MUSCULOSKELETAL:no cyanosis of digits and no clubbing  NEURO: Grade 1 peripheral neuropathy in the feet EXTREMITIES: No lower extremity edema BREAST: Left mastectomy scar is without lumps or nodules. Right breast postoperative and postradiation changes but no palpable lumps or nodules.. (exam performed in the presence of a chaperone)  LABORATORY DATA:  I have reviewed the data as listed   Chemistry      Component Value Date/Time   NA 140 05/31/2015 0830   NA  142 12/28/2014 1533   K 4.1 05/31/2015 0830   K 4.1 12/28/2014 1533   CL 104 12/28/2014 1533   CO2 28 05/31/2015 0830   CO2 30 12/28/2014 1533   BUN 15.4 05/31/2015 0830   BUN 11 12/28/2014 1533   CREATININE 0.6 05/31/2015 0830   CREATININE 0.76 12/28/2014 1533      Component Value Date/Time   CALCIUM 8.8 05/31/2015 0830   CALCIUM 9.3 12/28/2014 1533   ALKPHOS 82 05/31/2015 0830   ALKPHOS 67 02/24/2014 1013   AST 22 05/31/2015 0830   AST 33 02/24/2014 1013   ALT 16 05/31/2015 0830   ALT 31 02/24/2014 1013   BILITOT 0.31 05/31/2015 0830   BILITOT 0.7 02/24/2014 1013       Lab Results  Component Value Date   WBC 3.9 05/31/2015    HGB 12.0 05/31/2015   HCT 37.2 05/31/2015   MCV 95.6 05/31/2015   PLT 184 05/31/2015   NEUTROABS 2.6 05/31/2015     ASSESSMENT & PLAN:  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 breast 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, focal vascular involvement, posterior margin involvement, 1/2 sentinel node positive, T1 cN1 M0 stage II a, Mammaprint luminal type B, high risk, disease free survival with chemotherapy and hormonal therapy 88% versus 76% with hormonal therapy alone;   Treatment summary: Adjuvant chemotherapy with Taxotere and Cytoxan every 3 weeks 4 cycles started 01/15/15 completed 03/19/15, followed by adjuvant radiation therapy 04/10/15- 05/25/2015 Started anastrozole 5 years 05/31/2015  Anastrozole toxicities: Very occasional hot flashes Occasional myalgias and arthralgias: Instructed her to take small amount of tonic water at bedtime. I suspect it may be related to neuropathy related to Taxotere.  Difficulty falling asleep at night:Much improved since she started taking anastrozole in the morning.  Neuropathy: Improving slowly with time. We'll continue to watch and observe.  Breast Cancer Surveillance: 1. Breast exam every 02/28/16: No abnormalities 2. Mammograms: December 2016 revealed a 3 mm area of calcification the right breast a 6 month follow-up was recommended it is most likely benign versus fat necrosis.  Return to clinic in 6 months for follow-up.    No orders of the defined types were placed in this encounter.   The patient has a good understanding of the overall plan. she agrees with it. she will call with any problems that may develop before the next visit here.   Rulon Eisenmenger, MD 02/28/2016

## 2016-03-10 ENCOUNTER — Encounter: Payer: Self-pay | Admitting: Internal Medicine

## 2016-03-10 ENCOUNTER — Ambulatory Visit (AMBULATORY_SURGERY_CENTER): Payer: Medicare Other | Admitting: Internal Medicine

## 2016-03-10 VITALS — BP 161/71 | HR 77 | Temp 97.8°F | Resp 12 | Ht 61.0 in | Wt 175.0 lb

## 2016-03-10 DIAGNOSIS — D120 Benign neoplasm of cecum: Secondary | ICD-10-CM | POA: Diagnosis not present

## 2016-03-10 DIAGNOSIS — E669 Obesity, unspecified: Secondary | ICD-10-CM | POA: Diagnosis not present

## 2016-03-10 DIAGNOSIS — Z1211 Encounter for screening for malignant neoplasm of colon: Secondary | ICD-10-CM | POA: Diagnosis present

## 2016-03-10 DIAGNOSIS — G4733 Obstructive sleep apnea (adult) (pediatric): Secondary | ICD-10-CM | POA: Diagnosis not present

## 2016-03-10 DIAGNOSIS — E039 Hypothyroidism, unspecified: Secondary | ICD-10-CM | POA: Diagnosis not present

## 2016-03-10 MED ORDER — SODIUM CHLORIDE 0.9 % IV SOLN: 500.0000 mL | INTRAVENOUS | Status: DC

## 2016-03-10 NOTE — Op Note (Signed)
[name] Procedure Date: 03/10/2016 11:12 AM MRN: [id number] Endoscopist: Docia Chuck. Henrene Pastor , MD Age: 75 Referring MD:  Date of Birth: Dec 08, 1941 Gender: Female Procedure:                Colonoscopy with snare polypectomy -2 Indications:              Screening for colorectal malignant neoplasm. Index                            examination 2006 incomplete due to sigmoid                            stenosis. Negative BE. Subsequent sigmoid colon                            resection for diverticular disease in 2007 Medicines:                Monitored Anesthesia Care Procedure:                Pre-Anesthesia Assessment:                           - Prior to the procedure, a History and Physical                            was performed, and patient medications and                            allergies were reviewed. The patient's tolerance of                            previous anesthesia was also reviewed. The risks                            and benefits of the procedure and the sedation                            options and risks were discussed with the patient.                            All questions were answered, and informed consent                            was obtained. Prior Anticoagulants: The patient has                            taken no previous anticoagulant or antiplatelet                            agents. ASA Grade Assessment: II - A patient with                            mild systemic disease. After reviewing the risks  and benefits, the patient was deemed in                            satisfactory condition to undergo the procedure.                           After obtaining informed consent, the colonoscope                            was passed under direct vision. Throughout the                            procedure, the patient's blood pressure, pulse, and                            oxygen saturations were  monitored continuously. The                            Model PCF-H190L [id number]) scope was introduced                            through the anus and advanced to the the cecum,                            identified by appendiceal orifice and ileocecal                            valve. The colonoscopy was performed without                            difficulty. The patient tolerated the procedure                            well. The quality of the bowel preparation was                            excellent. The bowel preparation used was Miralax.                            The ileocecal valve, appendiceal orifice, and                            rectum were photographed. Scope In: 11:21:07 AM Scope Out: 11:35:55 AM Scope Withdrawal Time: 0 hours 11 minutes 11 seconds  Total Procedure Duration: 0 hours 14 minutes 48 seconds  Findings:      The digital rectal exam was normal.      Two sessile polyps were found in the cecum. The polyps were 3 to 5 mm in       size. These polyps were removed with a cold snare. Resection and       retrieval were complete.      No other significant abnormalities were identified in a careful       examination of the remainder of the colon.      No additional abnormalities were found on retroflexion.  Complications:            No immediate complications. Estimated Blood Loss:     Estimated blood loss: none. Impression:               - Two 3 to 5 mm polyps in the cecum, removed with a                            cold snare. Resected and retrieved. Recommendation:           - Patient has a contact number available for                            emergencies. The signs and symptoms of potential                            delayed complications were discussed with the                            patient. Return to normal activities tomorrow.                            Written discharge instructions were provided to the                            patient.                            - Resume previous diet.                           - Continue present medications.                           - Await pathology results.                           - No repeat colonoscopy due to age. Procedure Code(s):        --- Professional ---                           [phone], Colonoscopy, flexible; with removal of                            tumor(s), polyp(s), or other lesion(s) by snare                            technique CPT copyright 2016 [employer]. All rights reserved. Docia Chuck. Henrene Pastor, MD 03/10/2016 11:42:59 AM This report has been signed electronically. Number of Addenda: 0 CC Letter to:             Marletta Lor, MD Referring MD:      Marletta Lor

## 2016-03-10 NOTE — Patient Instructions (Signed)
Colon polyps removed today. Handout given on polyps. Result letter in your mail 2-3 weeks. Call us with any questions or concerns. Thank you!  YOU HAD AN ENDOSCOPIC PROCEDURE TODAY AT [facility]:   Refer to the procedure report that was given to you for any specific questions about what was found during the examination.  If the procedure report does not answer your questions, please call your gastroenterologist to clarify.  If you requested that your care partner not be given the details of your procedure findings, then the procedure report has been included in a sealed envelope for you to review at your convenience later.  YOU SHOULD EXPECT: Some feelings of bloating in the abdomen. Passage of more gas than usual.  Walking can help get rid of the air that was put into your GI tract during the procedure and reduce the bloating. If you had a lower endoscopy (such as a colonoscopy or flexible sigmoidoscopy) you may notice spotting of blood in your stool or on the toilet paper. If you underwent a bowel prep for your procedure, you may not have a normal bowel movement for a few days.  Please Note:  You might notice some irritation and congestion in your nose or some drainage.  This is from the oxygen used during your procedure.  There is no need for concern and it should clear up in a day or so.  SYMPTOMS TO REPORT IMMEDIATELY:   Following lower endoscopy (colonoscopy or flexible sigmoidoscopy):  Excessive amounts of blood in the stool  Significant tenderness or worsening of abdominal pains  Swelling of the abdomen that is new, acute  Fever of 100F or higher   For urgent or emergent issues, a gastroenterologist can be reached at any hour by calling [phone].   DIET: Your first meal following the procedure should be a small meal and then it is ok to progress to your normal diet. Heavy or fried foods are harder to digest and may make you feel nauseous or bloated.  Likewise,  meals heavy in dairy and vegetables can increase bloating.  Drink plenty of fluids but you should avoid alcoholic beverages for 24 hours.  ACTIVITY:  You should plan to take it easy for the rest of today and you should NOT DRIVE or use heavy machinery until tomorrow (because of the sedation medicines used during the test).    FOLLOW UP: Our staff will call the number listed on your records the next business day following your procedure to check on you and address any questions or concerns that you may have regarding the information given to you following your procedure. If we do not reach you, we will leave a message.  However, if you are feeling well and you are not experiencing any problems, there is no need to return our call.  We will assume that you have returned to your regular daily activities without incident.  If any biopsies were taken you will be contacted by phone or by letter within the next 1-3 weeks.  Please call us at [phone] if you have not heard about the biopsies in 3 weeks.    SIGNATURES/CONFIDENTIALITY: You and/or your care partner have signed paperwork which will be entered into your electronic medical record.  These signatures attest to the fact that that the information above on your After Visit Summary has been reviewed and is understood.  Full responsibility of the confidentiality of this discharge information lies with you and/or your care-partner.

## 2016-03-10 NOTE — Progress Notes (Signed)
To recovery, report to Myers, RN, VSS. 

## 2016-03-10 NOTE — Progress Notes (Signed)
Called to room to assist during endoscopic procedure.  Patient ID and intended procedure confirmed with present staff. Received instructions for my participation in the procedure from the performing physician.  

## 2016-03-11 ENCOUNTER — Telehealth: Payer: Self-pay | Admitting: *Deleted

## 2016-03-11 NOTE — Telephone Encounter (Signed)
  Follow up Call-  Call back number 03/10/2016  Post procedure Call Back phone  # [phone]  Permission to leave phone message Yes     Patient questions:  Do you have a fever, pain , or abdominal swelling? No. Pain Score  0 *  Have you tolerated food without any problems? Yes.    Have you been able to return to your normal activities? Yes.    Do you have any questions about your discharge instructions: Diet   No. Medications  No. Follow up visit  No.  Do you have questions or concerns about your Care? No.  Actions: * If pain score is 4 or above: No action needed, pain <4.

## 2016-03-14 ENCOUNTER — Other Ambulatory Visit: Payer: Self-pay

## 2016-03-14 DIAGNOSIS — Z780 Asymptomatic menopausal state: Secondary | ICD-10-CM

## 2016-03-14 DIAGNOSIS — Z79811 Long term (current) use of aromatase inhibitors: Secondary | ICD-10-CM

## 2016-03-14 DIAGNOSIS — C50211 Malignant neoplasm of upper-inner quadrant of right female breast: Secondary | ICD-10-CM

## 2016-03-17 ENCOUNTER — Encounter: Payer: Self-pay | Admitting: Internal Medicine

## 2016-03-17 ENCOUNTER — Telehealth: Payer: Self-pay | Admitting: Hematology and Oncology

## 2016-03-17 NOTE — Telephone Encounter (Signed)
Spoke with patient to confirm bone density and ov fu appts in May

## 2016-04-09 DIAGNOSIS — M8589 Other specified disorders of bone density and structure, multiple sites: Secondary | ICD-10-CM | POA: Diagnosis not present

## 2016-04-09 LAB — HM DEXA SCAN

## 2016-04-11 ENCOUNTER — Encounter: Payer: Self-pay | Admitting: *Deleted

## 2016-04-11 NOTE — Progress Notes (Signed)
Bone density report received from [facility], reviewed by Dr. Lindi Adie, sent to scan.

## 2016-04-14 ENCOUNTER — Other Ambulatory Visit: Payer: Self-pay | Admitting: Internal Medicine

## 2016-04-14 ENCOUNTER — Ambulatory Visit (HOSPITAL_BASED_OUTPATIENT_CLINIC_OR_DEPARTMENT_OTHER): Payer: Medicare Other | Admitting: Hematology and Oncology

## 2016-04-14 ENCOUNTER — Encounter: Payer: Self-pay | Admitting: Hematology and Oncology

## 2016-04-14 VITALS — BP 152/80 | HR 71 | Temp 97.8°F | Resp 17

## 2016-04-14 DIAGNOSIS — C50211 Malignant neoplasm of upper-inner quadrant of right female breast: Secondary | ICD-10-CM

## 2016-04-14 DIAGNOSIS — G629 Polyneuropathy, unspecified: Secondary | ICD-10-CM | POA: Diagnosis not present

## 2016-04-14 DIAGNOSIS — Z170 Estrogen receptor positive status [ER+]: Secondary | ICD-10-CM | POA: Diagnosis not present

## 2016-04-14 DIAGNOSIS — Z79811 Long term (current) use of aromatase inhibitors: Secondary | ICD-10-CM | POA: Diagnosis not present

## 2016-04-14 MED ORDER — LETROZOLE 2.5 MG PO TABS: 2.5000 mg | ORAL_TABLET | Freq: Every day | ORAL | Status: DC

## 2016-04-14 NOTE — Progress Notes (Signed)
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 Saralyn Pilar.  SUMMARY OF ONCOLOGIC HISTORY:   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 breast ([place])   06/07/1996 Miscellaneous History of Stage IIB left breast cancer S/P mastectomy and chemotherapy (Adriamycin and Cytoxan) on CALGB 9397.   11/10/2014 Mammogram Right breast: 9 mm mass    11/13/2014 Breast US Right breast: 9 mm mass at 1:00 position with indistinct margin, middle depth, hypoechoic.   11/15/2014 Initial Biopsy Right breast needle biopsy: Invasive ductal carcinoma, grade 2, with DCIS with lymphovascular invasion ER+ (100%), PR+ (100%), HER-2/neu negative (ratio 1.87), Ki-67 23%   11/15/2014 Clinical Stage Stage IA; T1b N0   11/28/2014 Definitive Surgery Right breast lumpectomy with SLNB Donne Hazel): Invasive ductal carcinoma, grade 3, 1.7 cm, with DCIS, focal vascular involvement, posterior margin involvement, 1/2 sentinel nodes positive for malignancy, repeat HER2/neu negative (ratio 1.14)   11/28/2014 Procedure Mammaprint: High Risk Luminal Type B.    11/28/2014 Pathologic Stage Stage IIA: pT1c, pN1a   01/01/2015 Surgery Rexcision of right breast margin: surgical margins negative for malignancy   01/15/2015 - 03/20/2015 Chemotherapy Adjuvant  chemotherapy with Taxotere and Cyclophosphamide X 4 cycles (Dharma Pare)   04/10/2015 - 05/25/2015 Radiation Therapy Adjuvant XRT completed Pablo Ledger): Right breast 45 Gy over 25 fractions; Right breast boost 16 Gy at 2 Gy over 8 fractions. Total dose 61 Gy   05/31/2015 -  Anti-estrogen oral therapy Anastrozole 1 mg daily (Leonetta Mcgivern).  Planned duration of therapy: 5 years.   08/03/2015 Survivorship Survivorship visit completed and copy of survivorship care plan provided to patient.    CHIEF COMPLIANT: inability to tolerate anastrozole  INTERVAL HISTORY: Shawna Marquez is a 75 year old with above-mentioned history of recurrent breast cancer who is currently on adjuvant antiestrogen therapy with anastrozole. She stopped anastrozole because of severe tendonitis in bilateral wrists. We instructed her to stop anastrozole therapy and is here today to discuss her symptoms. It appears that the symptoms have improved significantly. Although she still continues to have pain and discomfort in the bilateral wrist area.whenever she exercises it appears that she sleeps better.  REVIEW OF SYSTEMS:   Constitutional: Denies fevers, chills or abnormal weight loss Eyes: Denies blurriness of vision Ears, nose, mouth, throat, and face: Denies mucositis or sore throat Respiratory: Denies cough, dyspnea or wheezes Cardiovascular: Denies palpitation, chest discomfort Gastrointestinal:  Denies nausea, heartburn or change in bowel habits Skin: Denies abnormal skin rashes Lymphatics: Denies new lymphadenopathy or easy bruising Neurological:Denies numbness, tingling or new weaknesses Behavioral/Psych: Mood is stable, no new changes  Extremities: No lower extremity edema Breast:  denies any pain or lumps or nodules in either breasts All other systems were reviewed with the patient and are negative.  I have reviewed the past medical history, past surgical history, social history and family history  with the patient and they are  unchanged from previous note.  ALLERGIES:  has No Known Allergies.  MEDICATIONS:  Current Outpatient Prescriptions  Medication Sig Dispense Refill  . aspirin EC 81 MG tablet Take 81 mg by mouth at bedtime. Reported on 02/25/2016    . Calcium Citrate-Vitamin D (CALCIUM CITRATE + D3 PO) Take 1 tablet by mouth 2 (two) times daily. Calcium 500 mg, Vitamin D 800 units    . cetirizine (ZYRTEC) 10 MG tablet Take 10 mg by mouth daily.    . cholecalciferol (VITAMIN D) 1000 UNITS tablet Take 1,000 Units by mouth daily with lunch.     . Coenzyme Q10 (COQ10) 200 MG CAPS Take 200 mg by mouth every other day.    . fluticasone (FLONASE) 50 MCG/ACT nasal spray INSTILL 1 SPRAY INTO EACH NOSTRIL TWICE DAILY 48 g 3  . fluticasone (FLONASE) 50 MCG/ACT nasal spray SHAKE WELL AND USE 1 SPRAY IN EACH NOSTRIL TWICE DAILY 16 g 3  . folic acid (FOLVITE) 628 MCG tablet Take 400 mcg by mouth daily with lunch.     . hydrocortisone cream 1 % Apply 1 application topically daily as needed for itching (eczema).    Marland Kitchen letrozole (FEMARA) 2.5 MG tablet Take 1 tablet (2.5 mg total) by mouth daily. 90 tablet 3  . levothyroxine (SYNTHROID, LEVOTHROID) 125 MCG tablet Take 1 tablet (125 mcg total) by mouth daily before breakfast. 90 tablet 3  . Multiple Vitamin (MULTIVITAMIN WITH MINERALS) TABS tablet Take 1 tablet by mouth daily with lunch.    . non-metallic deodorant Jethro Poling) MISC Apply 1 application topically daily as needed.    . Omega-3 Fatty Acids (FISH OIL) 1000 MG CAPS Take 1,000 mg by mouth daily with lunch.    . Turmeric 450 MG CAPS Take 450 mg by mouth daily with lunch.     . vitamin C (ASCORBIC ACID) 500 MG tablet Take 500 mg by mouth 2 (two) times daily.     No current facility-administered medications for this visit.    PHYSICAL EXAMINATION: ECOG PERFORMANCE STATUS: 1 - Symptomatic but completely ambulatory  Filed Vitals:   04/14/16 1309  BP: 152/80  Pulse: 71  Temp: 97.8 F (36.6 C)  Resp: 17     GENERAL:alert, no distress and comfortable SKIN: skin color, texture, turgor are normal, no rashes or significant lesions EYES: normal, Conjunctiva are pink and non-injected, sclera clear OROPHARYNX:no exudate, no erythema and lips, buccal mucosa, and tongue normal  NECK: supple, thyroid normal size, non-tender, without nodularity LYMPH:  no palpable lymphadenopathy in the cervical, axillary or inguinal LUNGS: clear to auscultation and percussion with normal breathing effort HEART: regular rate & rhythm and no murmurs and no lower extremity edema ABDOMEN:abdomen soft, non-tender and normal bowel sounds MUSCULOSKELETAL:bilateral wrist tendinitis NEURO: alert & oriented x 3 with fluent speech, no focal motor/sensory deficits EXTREMITIES: No lower extremity edema BREAST: No palpable masses or nodules in either right or left breasts. No palpable axillary supraclavicular or infraclavicular adenopathy no breast tenderness or nipple discharge. (exam performed in the presence of a chaperone)  LABORATORY DATA:  I have reviewed the data as listed   Chemistry      Component Value Date/Time   NA 140 05/31/2015 0830   NA 142 12/28/2014 1533   K 4.1 05/31/2015 0830   K 4.1 12/28/2014 1533   CL 104 12/28/2014 1533   CO2 28 05/31/2015 0830   CO2 30 12/28/2014 1533   BUN 15.4 05/31/2015 0830  BUN 11 12/28/2014 1533   CREATININE 0.6 05/31/2015 0830   CREATININE 0.76 12/28/2014 1533      Component Value Date/Time   CALCIUM 8.8 05/31/2015 0830   CALCIUM 9.3 12/28/2014 1533   ALKPHOS 82 05/31/2015 0830   ALKPHOS 67 02/24/2014 1013   AST 22 05/31/2015 0830   AST 33 02/24/2014 1013   ALT 16 05/31/2015 0830   ALT 31 02/24/2014 1013   BILITOT 0.31 05/31/2015 0830   BILITOT 0.7 02/24/2014 1013      Lab Results  Component Value Date   WBC 3.9 05/31/2015   HGB 12.0 05/31/2015   HCT 37.2 05/31/2015   MCV 95.6 05/31/2015   PLT 184 05/31/2015   NEUTROABS 2.6 05/31/2015   ASSESSMENT &  PLAN:  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 breast 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, focal vascular involvement, posterior margin involvement, 1/2 sentinel node positive, T1 cN1 M0 stage II a, Mammaprint luminal type B, high risk, disease free survival with chemotherapy and hormonal therapy 88% versus 76% with hormonal therapy alone;   Treatment summary: Adjuvant chemotherapy with Taxotere and Cytoxan every 3 weeks 4 cycles started 01/15/15 completed 03/19/15, followed by adjuvant radiation therapy 04/10/15- 05/25/2015 Started anastrozole 5 years 05/31/2015 stopped 04/03/2016 due to tendinitis involving bilateral wrists  Anastrozole toxicities: 1. Hot flashes: resolved 2. Tendonitis bilateral wrists: I instructed her to take Naprosyn for anti-inflammatory effect. If it does not improve then she may need to see orthopedics for cortisone injection. 3. Difficulty falling asleep at night:Much improved since she started taking anastrozole in the morning.  Our plan is to switch her to letrozole once the symptoms from anastrozole have subsided. I gave her a prescription for three-month supply. If she cannot tolerate letrozole then we will try Aromasin. And the last option would be to use tamoxifen.  Neuropathy: Improving slowly with time. We'll continue to watch and observe. Bone density 04/09/2016: Osteopenia T score -1.9 continue with calcium and vitamin D  Breast Cancer Surveillance: 1. Breast exam every 02/28/16: No abnormalities 2. Mammograms: December 2016 revealed a 3 mm area of calcification the right breast a 6 month follow-up was recommended it is most likely benign versus fat necrosis.  Return to clinic at her previously scheduled appointment  No orders of the defined types were placed in this encounter.   The patient has a good understanding of the overall plan. she agrees with it. she will call with any problems that may develop before the next visit  here.   Rulon Eisenmenger, MD 04/14/2016

## 2016-04-14 NOTE — Assessment & Plan Note (Signed)
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, focal vascular involvement, posterior margin involvement, 1/2 sentinel node positive, T1 cN1 M0 stage II a, Mammaprint luminal type B, high risk, disease free survival with chemotherapy and hormonal therapy 88% versus 76% with hormonal therapy alone;   Treatment summary: Adjuvant chemotherapy with Taxotere and Cytoxan every 3 weeks 4 cycles started 01/15/15 completed 03/19/15, followed by adjuvant radiation therapy 04/10/15- 05/25/2015 Started anastrozole 5 years 05/31/2015  Anastrozole toxicities: Very occasional hot flashes Occasional myalgias and arthralgias  Difficulty falling asleep at night:Much improved since she started taking anastrozole in the morning. Neuropathy: Improving slowly with time. We'll continue to watch and observe.  Breast Cancer Surveillance: 1. Breast exam every 02/28/16: No abnormalities 2. Mammograms: December 2016 revealed a 3 mm area of calcification the right breast a 6 month follow-up was recommended it is most likely benign versus fat necrosis.  Return to clinic in 6 months for follow-up.

## 2016-04-15 ENCOUNTER — Ambulatory Visit: Payer: Medicare Other | Admitting: Hematology and Oncology

## 2016-04-17 ENCOUNTER — Encounter: Payer: Self-pay | Admitting: Internal Medicine

## 2016-05-01 ENCOUNTER — Other Ambulatory Visit: Payer: Self-pay | Admitting: *Deleted

## 2016-05-14 ENCOUNTER — Encounter: Payer: Self-pay | Admitting: Internal Medicine

## 2016-05-14 MED ORDER — FLUTICASONE PROPIONATE 50 MCG/ACT NA SUSP: NASAL | Status: DC

## 2016-05-27 DIAGNOSIS — R921 Mammographic calcification found on diagnostic imaging of breast: Secondary | ICD-10-CM | POA: Diagnosis not present

## 2016-05-27 DIAGNOSIS — L820 Inflamed seborrheic keratosis: Secondary | ICD-10-CM | POA: Diagnosis not present

## 2016-05-27 LAB — HM MAMMOGRAPHY

## 2016-05-29 ENCOUNTER — Encounter: Payer: Self-pay | Admitting: Internal Medicine

## 2016-05-30 ENCOUNTER — Other Ambulatory Visit: Payer: Self-pay

## 2016-05-30 MED ORDER — EXEMESTANE 25 MG PO TABS: 25.0000 mg | ORAL_TABLET | Freq: Every day | ORAL | Status: DC

## 2016-05-30 NOTE — Progress Notes (Signed)
Pt reports joint pain worse than with Arimidex.  Per Dr. Lindi Adie, stop Arimidex, try Aromasin.  Order sent, pt notified.

## 2016-06-11 ENCOUNTER — Telehealth: Payer: Self-pay | Admitting: Hematology and Oncology

## 2016-06-11 NOTE — Telephone Encounter (Signed)
pt cld to get appt time & date of next appt-gave pt time & dte

## 2016-08-23 IMAGING — CR DG CHEST 1V PORT
1 series · 1 of 1 positions shown · non-contrast
Comparison: November 19, 2006

CLINICAL DATA: Port-A-Cath insertion

EXAM:
PORTABLE CHEST - 1 VIEW

[AP]
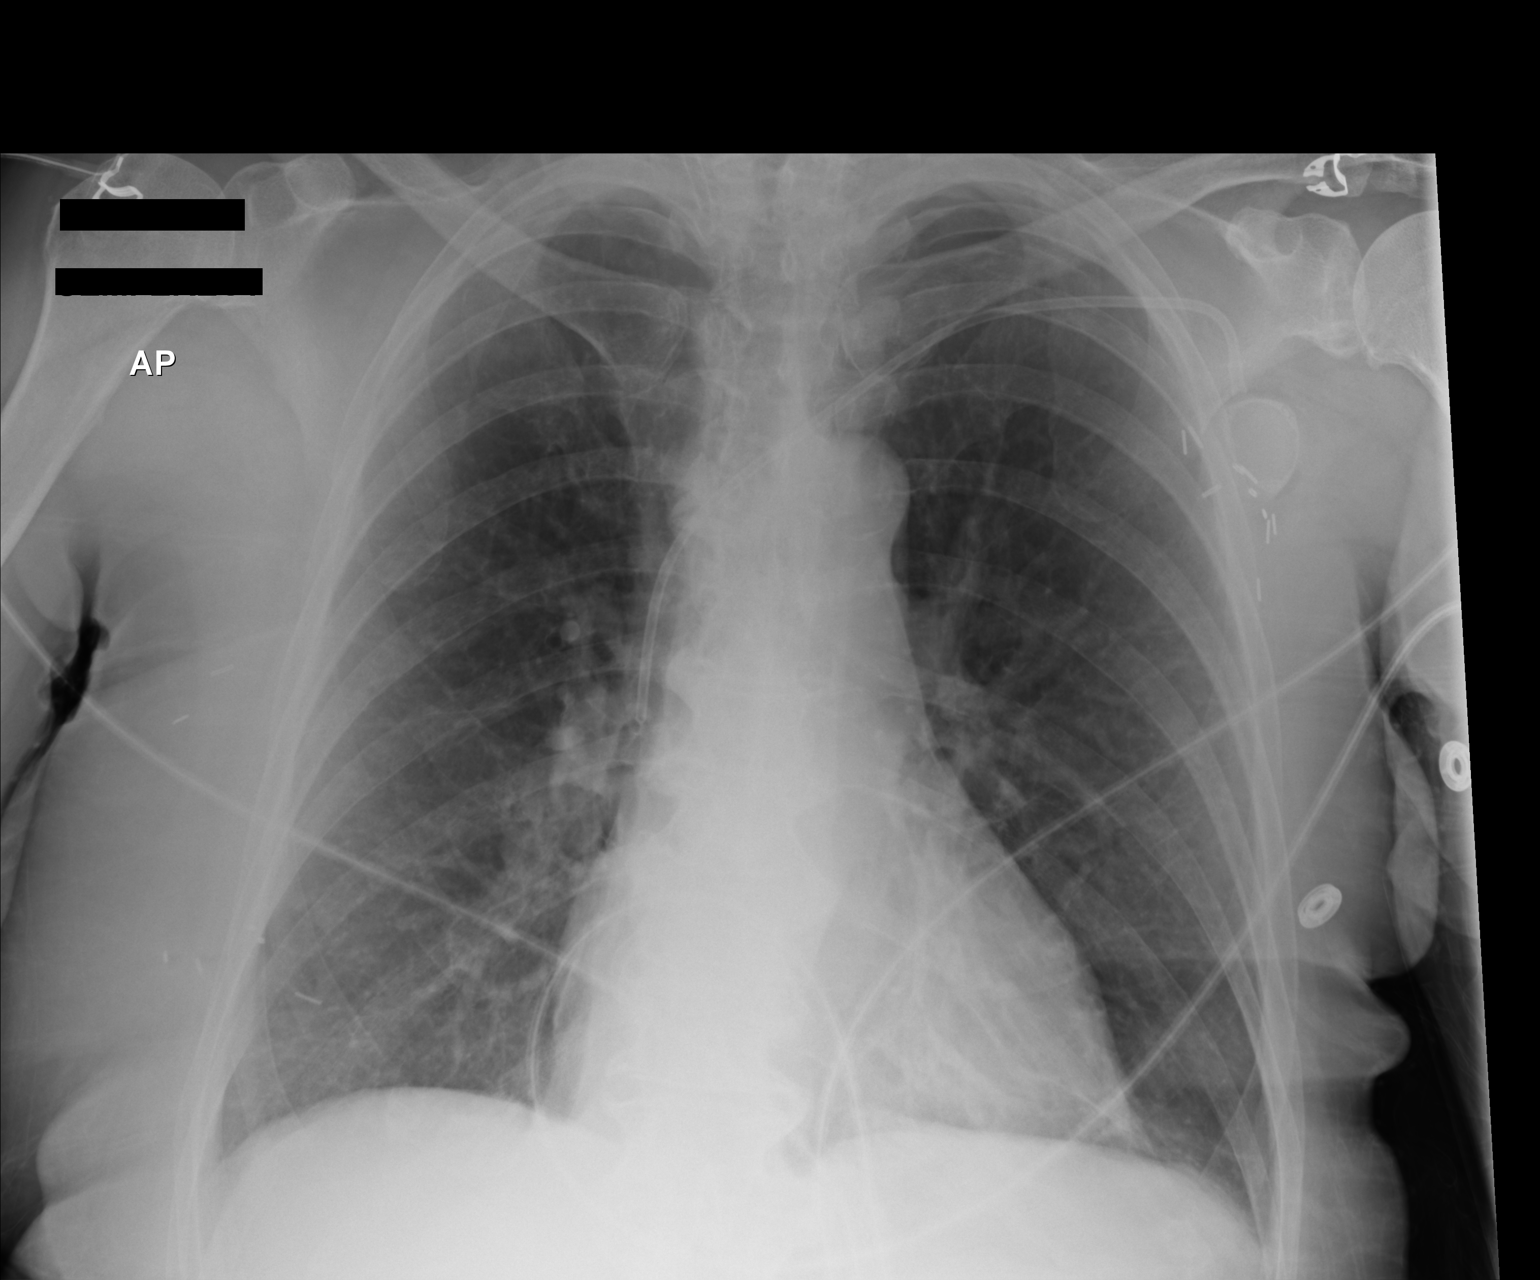

[1 of 1 positions shown; findings below may reference images not displayed]

FINDINGS: Port-A-Cath tip is in the superior vena cava. No pneumothorax. There
is underlying emphysematous change. There is minimal scarring in the
left base. Lungs elsewhere clear. Heart size is within normal
limits. Pulmonary vascularity is normal. There are surgical clips in
left apex as well as over the right breast. No adenopathy. No bone
lesions.
IMPRESSION: Port-A-Cath tip in superior vena cava. No pneumothorax. Areas of
postoperative change. Mild scarring left base. Underlying emphysema.
No edema or consolidation.

## 2016-08-25 ENCOUNTER — Telehealth: Payer: Self-pay | Admitting: *Deleted

## 2016-08-25 NOTE — Telephone Encounter (Signed)
Pt called stating that she is in [place] and will not be able to make her appt on 9/22.  I rescheduled and confirmed pt for 09/11/16.

## 2016-08-29 ENCOUNTER — Ambulatory Visit: Payer: Medicare Other | Admitting: Hematology and Oncology

## 2016-09-11 ENCOUNTER — Ambulatory Visit (HOSPITAL_BASED_OUTPATIENT_CLINIC_OR_DEPARTMENT_OTHER): Payer: Medicare Other | Admitting: Hematology and Oncology

## 2016-09-11 ENCOUNTER — Encounter: Payer: Self-pay | Admitting: Hematology and Oncology

## 2016-09-11 DIAGNOSIS — G629 Polyneuropathy, unspecified: Secondary | ICD-10-CM

## 2016-09-11 DIAGNOSIS — Z170 Estrogen receptor positive status [ER+]: Secondary | ICD-10-CM | POA: Diagnosis not present

## 2016-09-11 DIAGNOSIS — M8580 Other specified disorders of bone density and structure, unspecified site: Secondary | ICD-10-CM | POA: Diagnosis not present

## 2016-09-11 DIAGNOSIS — C50211 Malignant neoplasm of upper-inner quadrant of right female breast: Secondary | ICD-10-CM

## 2016-09-11 DIAGNOSIS — Z79811 Long term (current) use of aromatase inhibitors: Secondary | ICD-10-CM

## 2016-09-11 MED ORDER — EXEMESTANE 25 MG PO TABS: 25.0000 mg | ORAL_TABLET | Freq: Every day | ORAL | 2 refills | Status: DC

## 2016-09-11 NOTE — Progress Notes (Signed)
Patient Care Team: Marletta Lor, MD as PCP - General Rolm Bookbinder, MD as Consulting Physician (General Surgery) Nicholas Lose, MD as Consulting Physician (Hematology and Oncology) Thea Silversmith, MD as Consulting Physician (Radiation Oncology) Holley Bouche, NP as Nurse Practitioner (Nurse Practitioner) Sylvan Cheese, NP as Nurse Practitioner (Nurse Practitioner)  DIAGNOSIS: 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 breast [facility])   Staging form: Breast, AJCC 7th Edition   - Clinical stage from 11/22/2014: Stage IA (T1b, N0, M0) - Unsigned   - Pathologic: Stage IIA (T1c, N1a, cM0) - Signed by Seward Grater, MD on 12/26/2014         Staging comments: Staged on final lumpectomy specimen read by Dr. Saralyn Pilar.  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      11/10/2014 Mammogram    Right breast: 9 mm mass       11/13/2014 Breast US    Right breast: 9 mm mass at 1:00 position with indistinct margin, middle depth, hypoechoic.      11/15/2014 Initial Biopsy    Right breast needle biopsy: Invasive ductal carcinoma, grade 2, with DCIS with lymphovascular invasion ER+ (100%), PR+ (100%), HER-2/neu negative (ratio 1.87), Ki-67 23%      11/15/2014 Clinical Stage    Stage IA; T1b N0      11/28/2014 Definitive Surgery    Right breast lumpectomy with SLNB Donne Hazel): Invasive ductal carcinoma, grade 3, 1.7 cm, with DCIS, focal vascular involvement, posterior margin involvement, 1/2 sentinel nodes positive for malignancy, repeat HER2/neu negative (ratio 1.14)      11/28/2014 Procedure    Mammaprint: High Risk Luminal Type B.       11/28/2014 Pathologic Stage    Stage IIA: pT1c, pN1a      01/01/2015 Surgery    Rexcision of right breast margin: surgical  margins negative for malignancy      01/15/2015 - 03/20/2015 Chemotherapy    Adjuvant chemotherapy with Taxotere and Cyclophosphamide X 4 cycles (Faye Strohman)      04/10/2015 - 05/25/2015 Radiation Therapy    Adjuvant XRT completed Pablo Ledger): Right breast 45 Gy over 25 fractions; Right breast boost 16 Gy at 2 Gy over 8 fractions. Total dose 61 Gy      05/31/2015 -  Anti-estrogen oral therapy    Anastrozole 1 mg daily stopped at 04/03/2016 due to tendinitis switched to [facility] 04/21/2016      08/03/2015 Survivorship    Survivorship visit completed and copy of survivorship care plan provided to patient.       CHIEF COMPLIANT: Follow-up of breast cancer  INTERVAL HISTORY: Shawna Marquez is a 75 year old with above-mentioned history of recurrent breast cancer currently off antiestrogen therapy. She initially took anastrozole which causes severe tendinitis and she switched to Femara in which causes once again severe tendinitis especially in her wrists. She has just starting to recover from the tendon inflammation. Her left is worse on the right. We prescribed her exemestane but she has not started that yet. She takes occasional  naproxyn for the pain  REVIEW OF SYSTEMS:   Constitutional: Denies fevers, chills or abnormal weight loss Eyes: Denies blurriness of vision Ears, nose, mouth, throat, and face: Denies mucositis or sore throat Respiratory: Denies cough, dyspnea or wheezes Cardiovascular: Denies palpitation, chest discomfort  Gastrointestinal:  Denies nausea, heartburn or change in bowel habits Skin: Denies abnormal skin rashes Lymphatics: Denies new lymphadenopathy or easy bruising Neurological:Denies numbness, tingling or new weaknesses Behavioral/Psych: Mood is stable, no new changes  Extremities: No lower extremity edema Breast:  denies any pain or lumps or nodules in either breasts All other systems were reviewed with the patient and are negative.  I have reviewed the past medical  history, past surgical history, social history and family history with the patient and they are unchanged from previous note.  ALLERGIES:  has No Known Allergies.  MEDICATIONS:  Current Outpatient Prescriptions  Medication Sig Dispense Refill  . aspirin EC 81 MG tablet Take 81 mg by mouth at bedtime. Reported on 02/25/2016    . Calcium Citrate-Vitamin D (CALCIUM CITRATE + D3 PO) Take 1 tablet by mouth 2 (two) times daily. Calcium 500 mg, Vitamin D 800 units    . cetirizine (ZYRTEC) 10 MG tablet Take 10 mg by mouth daily.    . cholecalciferol (VITAMIN D) 1000 UNITS tablet Take 1,000 Units by mouth daily with lunch.     . Coenzyme Q10 (COQ10) 200 MG CAPS Take 200 mg by mouth every other day.    . exemestane (AROMASIN) 25 MG tablet Take 1 tablet (25 mg total) by mouth daily after breakfast. 30 tablet 2  . fluticasone (FLONASE) 50 MCG/ACT nasal spray SHAKE WELL AND USE 1 SPRAY IN EACH NOSTRIL TWICE DAILY 48 g 3  . folic acid (FOLVITE) 761 MCG tablet Take 400 mcg by mouth daily with lunch.     . hydrocortisone cream 1 % Apply 1 application topically daily as needed for itching (eczema).    Marland Kitchen levothyroxine (SYNTHROID, LEVOTHROID) 125 MCG tablet Take 1 tablet (125 mcg total) by mouth daily before breakfast. 90 tablet 3  . Multiple Vitamin (MULTIVITAMIN WITH MINERALS) TABS tablet Take 1 tablet by mouth daily with lunch.    . non-metallic deodorant Jethro Poling) MISC Apply 1 application topically daily as needed.    . Omega-3 Fatty Acids (FISH OIL) 1000 MG CAPS Take 1,000 mg by mouth daily with lunch.    . Turmeric 450 MG CAPS Take 450 mg by mouth daily with lunch.     . vitamin C (ASCORBIC ACID) 500 MG tablet Take 500 mg by mouth 2 (two) times daily.     No current facility-administered medications for this visit.     PHYSICAL EXAMINATION: ECOG PERFORMANCE STATUS: 1 - Symptomatic but completely ambulatory  Vitals:   09/11/16 1503  BP: (!) 143/66  Pulse: 77  Resp: 18  Temp: 98 F (36.7 C)   Filed  Weights   09/11/16 1503  Weight: 176 lb 3.2 oz (79.9 kg)    GENERAL:alert, no distress and comfortable SKIN: skin color, texture, turgor are normal, no rashes or significant lesions EYES: normal, Conjunctiva are pink and non-injected, sclera clear OROPHARYNX:no exudate, no erythema and lips, buccal mucosa, and tongue normal  NECK: supple, thyroid normal size, non-tender, without nodularity LYMPH:  no palpable lymphadenopathy in the cervical, axillary or inguinal LUNGS: clear to auscultation and percussion with normal breathing effort HEART: regular rate & rhythm and no murmurs and no lower extremity edema ABDOMEN:abdomen soft, non-tender and normal bowel sounds MUSCULOSKELETAL:no cyanosis of digits and no clubbing  NEURO: alert & oriented x 3 with fluent speech, no focal motor/sensory deficits EXTREMITIES: No lower extremity edema, wrist pain   LABORATORY DATA:  I have reviewed the data as listed   Chemistry  Component Value Date/Time   NA 140 05/31/2015 0830   K 4.1 05/31/2015 0830   CL 104 12/28/2014 1533   CO2 28 05/31/2015 0830   BUN 15.4 05/31/2015 0830   CREATININE 0.6 05/31/2015 0830      Component Value Date/Time   CALCIUM 8.8 05/31/2015 0830   ALKPHOS 82 05/31/2015 0830   AST 22 05/31/2015 0830   ALT 16 05/31/2015 0830   BILITOT 0.31 05/31/2015 0830       Lab Results  Component Value Date   WBC 3.9 05/31/2015   HGB 12.0 05/31/2015   HCT 37.2 05/31/2015   MCV 95.6 05/31/2015   PLT 184 05/31/2015   NEUTROABS 2.6 05/31/2015     ASSESSMENT & PLAN:  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 breast 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, focal vascular involvement, posterior margin involvement, 1/2 sentinel node positive, T1 cN1 M0 stage II a, Mammaprint luminal type B, high risk, disease free survival with chemotherapy and hormonal therapy 88% versus 76% with hormonal therapy alone;   Treatment summary: Adjuvant  chemotherapy with Taxotere and Cytoxan every 3 weeks 4 cycles started 01/15/15 completed 03/19/15, followed by adjuvant radiation therapy 04/10/15- 05/25/2015  Started anastrozole 5 years 05/31/2015 stopped 04/03/2016 due to tendinitis involving bilateral wrists Started letrozole 04/14/2016  Letrozole toxicities: 1. Hot flashes: resolved 2. Tendonitis bilateral wrists: I instructed her to take Naprosyn for anti-inflammatory effect. If it does not improve then she may need to see orthopedics for cortisone injection. 3. Difficulty falling asleep at night:Much improved since she started taking anastrozole in the morning.  I asked her to start exemestane as of today.  Neuropathy: Improving slowly with time. We'll continue to watch and observe. Bone density 04/09/2016: Osteopenia T score -1.9 continue with calcium and vitamin D Severe arthritis: And tendinitis: Instructed her to use turmeric as well as muscle relaxant rubs to ease her symptoms.  Breast Cancer Surveillance: 1. Breast exam every 09/11/2016: No abnormalities 2. Mammograms: December 2016 revealed a 3 mm area of calcification the right breast a 6 month follow-up was recommended it is most likely benign versus fat necrosis.  Return to clinic in 3 months for follow-up   No orders of the defined types were placed in this encounter.  The patient has a good understanding of the overall plan. she agrees with it. she will call with any problems that may develop before the next visit here.   Rulon Eisenmenger, MD 09/11/16

## 2016-09-11 NOTE — Assessment & Plan Note (Signed)
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, focal vascular involvement, posterior margin involvement, 1/2 sentinel node positive, T1 cN1 M0 stage II a, Mammaprint luminal type B, high risk, disease free survival with chemotherapy and hormonal therapy 88% versus 76% with hormonal therapy alone;   Treatment summary: Adjuvant chemotherapy with Taxotere and Cytoxan every 3 weeks 4 cycles started 01/15/15 completed 03/19/15, followed by adjuvant radiation therapy 04/10/15- 05/25/2015  Started anastrozole 5 years 05/31/2015 stopped 04/03/2016 due to tendinitis involving bilateral wrists Started letrozole 04/14/2016  Letrozole toxicities: 1. Hot flashes: resolved 2. Tendonitis bilateral wrists: I instructed her to take Naprosyn for anti-inflammatory effect. If it does not improve then she may need to see orthopedics for cortisone injection. 3. Difficulty falling asleep at night:Much improved since she started taking anastrozole in the morning.  If she cannot tolerate letrozole then we will try Aromasin. And the last option would be to use tamoxifen.  Neuropathy: Improving slowly with time. We'll continue to watch and observe. Bone density 04/09/2016: Osteopenia T score -1.9 continue with calcium and vitamin D  Breast Cancer Surveillance: 1. Breast exam every 09/11/2016: No abnormalities 2. Mammograms: December 2016 revealed a 3 mm area of calcification the right breast a 6 month follow-up was recommended it is most likely benign versus fat necrosis.  Return to clinic in 6 months for follow-up

## 2016-09-19 DIAGNOSIS — H04222 Epiphora due to insufficient drainage, left lacrimal gland: Secondary | ICD-10-CM | POA: Diagnosis not present

## 2016-09-19 DIAGNOSIS — H04123 Dry eye syndrome of bilateral lacrimal glands: Secondary | ICD-10-CM | POA: Diagnosis not present

## 2016-09-19 DIAGNOSIS — H5310 Unspecified subjective visual disturbances: Secondary | ICD-10-CM | POA: Diagnosis not present

## 2016-10-09 ENCOUNTER — Ambulatory Visit (INDEPENDENT_AMBULATORY_CARE_PROVIDER_SITE_OTHER): Payer: Medicare Other

## 2016-10-09 DIAGNOSIS — Z23 Encounter for immunization: Secondary | ICD-10-CM

## 2016-11-05 ENCOUNTER — Other Ambulatory Visit: Payer: Self-pay

## 2016-11-05 MED ORDER — EXEMESTANE 25 MG PO TABS: 25.0000 mg | ORAL_TABLET | Freq: Every day | ORAL | 3 refills | Status: DC

## 2016-11-25 DIAGNOSIS — Z853 Personal history of malignant neoplasm of breast: Secondary | ICD-10-CM | POA: Diagnosis not present

## 2016-11-25 LAB — HM MAMMOGRAPHY

## 2016-11-27 ENCOUNTER — Encounter: Payer: Self-pay | Admitting: Internal Medicine

## 2016-11-29 ENCOUNTER — Other Ambulatory Visit: Payer: Self-pay | Admitting: Nurse Practitioner

## 2016-12-10 NOTE — Assessment & Plan Note (Signed)
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, focal vascular involvement, posterior margin involvement, 1/2 sentinel node positive, T1 cN1 M0 stage II a, Mammaprint luminal type B, high risk, disease free survival with chemotherapy and hormonal therapy 88% versus 76% with hormonal therapy alone;   Treatment summary: Adjuvant chemotherapy with Taxotere and Cytoxan every 3 weeks 4 cycles started 01/15/15 completed 03/19/15, followed by adjuvant radiation therapy 04/10/15- 05/25/2015  Started anastrozole 5 years 05/31/2015 stopped 04/03/2016 due to tendinitis involving bilateral wrists Started letrozole 04/14/2016  Letrozole toxicities: 1. Hot flashes: resolved 2. Tendonitis bilateral wrists: I instructed her to take Naprosyn for anti-inflammatory effect. If it does not improve then she may need to see orthopedics for cortisone injection. 3. Difficulty falling asleep at night:Much improved since she started taking anastrozole in the morning.  If she cannot tolerate letrozole then we will try Aromasin. And the last option would be to use tamoxifen.  Neuropathy: Improving slowly with time. We'll continue to watch and observe. Bone density 04/09/2016: Osteopenia T score -1.9 continue with calcium and vitamin D  Breast Cancer Surveillance: 1. Breast exam every 09/11/2016: No abnormalities 2. Mammograms: December 2017 Benign  Return to clinic in 6 months for follow-up

## 2016-12-11 ENCOUNTER — Encounter: Payer: Self-pay | Admitting: Hematology and Oncology

## 2016-12-11 ENCOUNTER — Ambulatory Visit (HOSPITAL_BASED_OUTPATIENT_CLINIC_OR_DEPARTMENT_OTHER): Payer: Medicare Other | Admitting: Hematology and Oncology

## 2016-12-11 DIAGNOSIS — G629 Polyneuropathy, unspecified: Secondary | ICD-10-CM | POA: Diagnosis not present

## 2016-12-11 DIAGNOSIS — C50211 Malignant neoplasm of upper-inner quadrant of right female breast: Secondary | ICD-10-CM | POA: Diagnosis not present

## 2016-12-11 DIAGNOSIS — Z170 Estrogen receptor positive status [ER+]: Secondary | ICD-10-CM | POA: Diagnosis not present

## 2016-12-11 DIAGNOSIS — Z79811 Long term (current) use of aromatase inhibitors: Secondary | ICD-10-CM

## 2016-12-11 NOTE — Progress Notes (Signed)
Patient Care Team: Marletta Lor, MD as PCP - General Rolm Bookbinder, MD as Consulting Physician (General Surgery) Nicholas Lose, MD as Consulting Physician (Hematology and Oncology) Thea Silversmith, MD as Consulting Physician (Radiation Oncology) Holley Bouche, NP as Nurse Practitioner (Nurse Practitioner) Sylvan Cheese, NP as Nurse Practitioner (Nurse Practitioner)  DIAGNOSIS:  Encounter Diagnosis  Name Primary?  . 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 breast ([place])     SUMMARY OF ONCOLOGIC HISTORY:   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 breast ([place])   06/07/1996 Miscellaneous    History of Stage IIB left breast cancer S/P mastectomy and chemotherapy (Adriamycin and Cytoxan) on CALGB 9397.      11/10/2014 Mammogram    Right breast: 9 mm mass       11/13/2014 Breast US    Right breast: 9 mm mass at 1:00 position with indistinct margin, middle depth, hypoechoic.      11/15/2014 Initial Biopsy    Right breast needle biopsy: Invasive ductal carcinoma, grade 2, with DCIS with lymphovascular invasion ER+ (100%), PR+ (100%), HER-2/neu negative (ratio 1.87), Ki-67 23%      11/15/2014 Clinical Stage    Stage IA; T1b N0      11/28/2014 Definitive Surgery    Right breast lumpectomy with SLNB Donne Hazel): Invasive ductal carcinoma, grade 3, 1.7 cm, with DCIS, focal vascular involvement, posterior margin involvement, 1/2 sentinel nodes positive for malignancy, repeat HER2/neu negative (ratio 1.14)      11/28/2014 Procedure    Mammaprint: High Risk Luminal Type B.       11/28/2014 Pathologic Stage    Stage IIA: pT1c, pN1a      01/01/2015 Surgery    Rexcision of right breast margin: surgical margins negative for malignancy      01/15/2015 - 03/20/2015 Chemotherapy    Adjuvant chemotherapy with Taxotere and Cyclophosphamide X 4 cycles (Jhana Giarratano)      04/10/2015 - 05/25/2015 Radiation Therapy    Adjuvant XRT completed  Pablo Ledger): Right breast 45 Gy over 25 fractions; Right breast boost 16 Gy at 2 Gy over 8 fractions. Total dose 61 Gy      05/31/2015 -  Anti-estrogen oral therapy    Anastrozole 1 mg daily stopped at 04/03/2016 due to tendinitis switched to Femara 04/21/2016 discontinued June 2017, starting exemestane 09/11/2016      08/03/2015 Survivorship    Survivorship visit completed and copy of survivorship care plan provided to patient.       CHIEF COMPLIANT: Follow-up on exemestane  INTERVAL HISTORY: Shawna Marquez is a 76 year old with above-mentioned history of right breast cancer recurrent disease who received adjuvant chemotherapy followed by radiation and has been on oral antiestrogen therapy with anastrozole initially then changed to Femara and is now on exemestane. She appears to be tolerating exemestane the past. She denies major problems with muscle aches and pains. Her muscles and tendons are doing much better. She had a mammogram in December which was normal.  REVIEW OF SYSTEMS:   Constitutional: Denies fevers, chills or abnormal weight loss Eyes: Denies blurriness of vision Ears, nose, mouth, throat, and face: Denies mucositis or sore throat Respiratory: Denies cough, dyspnea or wheezes Cardiovascular: Denies palpitation, chest discomfort Gastrointestinal:  Denies nausea, heartburn or change in bowel habits Skin: Denies abnormal skin rashes Lymphatics: Denies new lymphadenopathy or easy bruising Neurological:Denies numbness, tingling or new weaknesses Behavioral/Psych: Mood is stable, no new changes  Extremities: No lower extremity edema Breast:  denies any  pain or lumps or nodules in either breasts All other systems were reviewed with the patient and are negative.  I have reviewed the past medical history, past surgical history, social history and family history with the patient and they are unchanged from previous note.  ALLERGIES:  has No Known Allergies.  MEDICATIONS:    Current Outpatient Prescriptions  Medication Sig Dispense Refill  . aspirin EC 81 MG tablet Take 81 mg by mouth at bedtime. Reported on 02/25/2016    . Calcium Citrate-Vitamin D (CALCIUM CITRATE + D3 PO) Take 1 tablet by mouth 2 (two) times daily. Calcium 500 mg, Vitamin D 800 units    . cetirizine (ZYRTEC) 10 MG tablet Take 10 mg by mouth daily.    . cholecalciferol (VITAMIN D) 1000 UNITS tablet Take 1,000 Units by mouth daily with lunch.     . Coenzyme Q10 (COQ10) 200 MG CAPS Take 200 mg by mouth every other day.    . exemestane (AROMASIN) 25 MG tablet Take 1 tablet (25 mg total) by mouth daily after breakfast. 90 tablet 3  . fluticasone (FLONASE) 50 MCG/ACT nasal spray SHAKE WELL AND USE 1 SPRAY IN EACH NOSTRIL TWICE DAILY 48 g 3  . folic acid (FOLVITE) 761 MCG tablet Take 400 mcg by mouth daily with lunch.     . hydrocortisone cream 1 % Apply 1 application topically daily as needed for itching (eczema).    Marland Kitchen levothyroxine (SYNTHROID, LEVOTHROID) 125 MCG tablet Take 1 tablet (125 mcg total) by mouth daily before breakfast. 90 tablet 3  . Multiple Vitamin (MULTIVITAMIN WITH MINERALS) TABS tablet Take 1 tablet by mouth daily with lunch.    . non-metallic deodorant Jethro Poling) MISC Apply 1 application topically daily as needed.    . Omega-3 Fatty Acids (FISH OIL) 1000 MG CAPS Take 1,000 mg by mouth daily with lunch.    . Turmeric 450 MG CAPS Take 450 mg by mouth daily with lunch.     . vitamin C (ASCORBIC ACID) 500 MG tablet Take 500 mg by mouth 2 (two) times daily.     No current facility-administered medications for this visit.     PHYSICAL EXAMINATION: ECOG PERFORMANCE STATUS: 1 - Symptomatic but completely ambulatory  Vitals:   12/11/16 1352  BP: (!) 150/93  Pulse: 88  Resp: 18  Temp: 98.3 F (36.8 C)   Filed Weights   12/11/16 1352  Weight: 178 lb (80.7 kg)    GENERAL:alert, no distress and comfortable SKIN: skin color, texture, turgor are normal, no rashes or significant  lesions EYES: normal, Conjunctiva are pink and non-injected, sclera clear OROPHARYNX:no exudate, no erythema and lips, buccal mucosa, and tongue normal  NECK: supple, thyroid normal size, non-tender, without nodularity LYMPH:  no palpable lymphadenopathy in the cervical, axillary or inguinal LUNGS: clear to auscultation and percussion with normal breathing effort HEART: regular rate & rhythm and no murmurs and no lower extremity edema ABDOMEN:abdomen soft, non-tender and normal bowel sounds MUSCULOSKELETAL:no cyanosis of digits and no clubbing  NEURO: alert & oriented x 3 with fluent speech, no focal motor/sensory deficits EXTREMITIES: No lower extremity edema  LABORATORY DATA:  I have reviewed the data as listed   Chemistry      Component Value Date/Time   NA 140 05/31/2015 0830   K 4.1 05/31/2015 0830   CL 104 12/28/2014 1533   CO2 28 05/31/2015 0830   BUN 15.4 05/31/2015 0830   CREATININE 0.6 05/31/2015 0830      Component Value Date/Time  CALCIUM 8.8 05/31/2015 0830   ALKPHOS 82 05/31/2015 0830   AST 22 05/31/2015 0830   ALT 16 05/31/2015 0830   BILITOT 0.31 05/31/2015 0830       Lab Results  Component Value Date   WBC 3.9 05/31/2015   HGB 12.0 05/31/2015   HCT 37.2 05/31/2015   MCV 95.6 05/31/2015   PLT 184 05/31/2015   NEUTROABS 2.6 05/31/2015    ASSESSMENT & PLAN:  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 breast 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, focal vascular involvement, posterior margin involvement, 1/2 sentinel node positive, T1 cN1 M0 stage II a, Mammaprint luminal type B, high risk, disease free survival with chemotherapy and hormonal therapy 88% versus 76% with hormonal therapy alone;   Treatment summary: Adjuvant chemotherapy with Taxotere and Cytoxan every 3 weeks 4 cycles started 01/15/15 completed 03/19/15, followed by adjuvant radiation therapy 04/10/15- 05/25/2015  Started anastrozole 5 years 05/31/2015 stopped  04/03/2016 due to tendinitis involving bilateral wrists Started letrozole 04/14/2016; changed to exemestane October 2017  Exemestane toxicities: Marked improvement in arthralgias and myalgias.  Neuropathy: Improving slowly with time. We'll continue to watch and observe. Bone density 04/09/2016: Osteopenia T score -1.9 continue with calcium and vitamin D  Breast Cancer Surveillance: 1. Breast exam every 09/11/2016: No abnormalities 2. Mammograms: December 2017 Benign  Return to clinic in 6 months for follow-up after that we can see her once a year   No orders of the defined types were placed in this encounter.  The patient has a good understanding of the overall plan. she agrees with it. she will call with any problems that may develop before the next visit here.   Rulon Eisenmenger, MD 12/11/16

## 2017-01-23 ENCOUNTER — Encounter: Payer: Self-pay | Admitting: Internal Medicine

## 2017-01-23 ENCOUNTER — Ambulatory Visit (INDEPENDENT_AMBULATORY_CARE_PROVIDER_SITE_OTHER): Payer: Medicare Other | Admitting: Internal Medicine

## 2017-01-23 VITALS — BP 122/78 | HR 76 | Temp 97.4°F | Ht 60.0 in | Wt 174.4 lb

## 2017-01-23 DIAGNOSIS — E034 Atrophy of thyroid (acquired): Secondary | ICD-10-CM | POA: Diagnosis not present

## 2017-01-23 DIAGNOSIS — Z0000 Encounter for general adult medical examination without abnormal findings: Secondary | ICD-10-CM

## 2017-01-23 DIAGNOSIS — M150 Primary generalized (osteo)arthritis: Secondary | ICD-10-CM

## 2017-01-23 DIAGNOSIS — M8580 Other specified disorders of bone density and structure, unspecified site: Secondary | ICD-10-CM

## 2017-01-23 DIAGNOSIS — E559 Vitamin D deficiency, unspecified: Secondary | ICD-10-CM

## 2017-01-23 DIAGNOSIS — Z853 Personal history of malignant neoplasm of breast: Secondary | ICD-10-CM | POA: Diagnosis not present

## 2017-01-23 DIAGNOSIS — E785 Hyperlipidemia, unspecified: Secondary | ICD-10-CM

## 2017-01-23 DIAGNOSIS — J301 Allergic rhinitis due to pollen: Secondary | ICD-10-CM

## 2017-01-23 DIAGNOSIS — Z86010 Personal history of colonic polyps: Secondary | ICD-10-CM | POA: Insufficient documentation

## 2017-01-23 DIAGNOSIS — M159 Polyosteoarthritis, unspecified: Secondary | ICD-10-CM

## 2017-01-23 LAB — LIPID PANEL
CHOLESTEROL: 175 mg/dL (ref 0–200)
HDL: 51.6 mg/dL (ref >39.00)
LDL Cholesterol: 108 mg/dL — ABNORMAL HIGH (ref 0–99)
NonHDL: 123.43
TRIGLYCERIDES: 76 mg/dL (ref 0.0–149.0)
Total CHOL/HDL Ratio: 3
VLDL: 15.2 mg/dL (ref 0.0–40.0)

## 2017-01-23 LAB — CBC WITH DIFFERENTIAL/PLATELET
Basophils Absolute: 0 10*3/uL (ref 0.0–0.1)
Basophils Relative: 1.1 % (ref 0.0–3.0)
EOS PCT: 3.9 % (ref 0.0–5.0)
Eosinophils Absolute: 0.2 10*3/uL (ref 0.0–0.7)
HCT: 42 % (ref 36.0–46.0)
HEMOGLOBIN: 14 g/dL (ref 12.0–15.0)
Lymphocytes Relative: 20.9 % (ref 12.0–46.0)
Lymphs Abs: 1 10*3/uL (ref 0.7–4.0)
MCHC: 33.4 g/dL (ref 30.0–36.0)
MCV: 94.9 fl (ref 78.0–100.0)
MONO ABS: 0.5 10*3/uL (ref 0.1–1.0)
Monocytes Relative: 10.4 % (ref 3.0–12.0)
Neutro Abs: 2.9 10*3/uL (ref 1.4–7.7)
Neutrophils Relative %: 63.7 % (ref 43.0–77.0)
Platelets: 218 10*3/uL (ref 150.0–400.0)
RBC: 4.43 Mil/uL (ref 3.87–5.11)
RDW: 13.1 % (ref 11.5–15.5)
WBC: 4.6 10*3/uL (ref 4.0–10.5)

## 2017-01-23 LAB — COMPREHENSIVE METABOLIC PANEL
ALBUMIN: 4.1 g/dL (ref 3.5–5.2)
ALK PHOS: 71 U/L (ref 39–117)
ALT: 13 U/L (ref 0–35)
AST: 17 U/L (ref 0–37)
BUN: 15 mg/dL (ref 6–23)
CALCIUM: 9.3 mg/dL (ref 8.4–10.5)
CO2: 29 mEq/L (ref 19–32)
Chloride: 103 mEq/L (ref 96–112)
Creatinine, Ser: 0.69 mg/dL (ref 0.40–1.20)
GFR: 87.46 mL/min (ref >60.00)
Glucose, Bld: 82 mg/dL (ref 70–99)
POTASSIUM: 3.9 meq/L (ref 3.5–5.1)
SODIUM: 140 meq/L (ref 135–145)
TOTAL PROTEIN: 6.7 g/dL (ref 6.0–8.3)
Total Bilirubin: 0.6 mg/dL (ref 0.2–1.2)

## 2017-01-23 LAB — VITAMIN D 25 HYDROXY (VIT D DEFICIENCY, FRACTURES): VITD: 26.54 ng/mL — AB (ref 30.00–100.00)

## 2017-01-23 LAB — TSH: TSH: 0.62 u[IU]/mL (ref 0.35–4.50)

## 2017-01-23 LAB — T4, FREE: Free T4: 1.13 ng/dL (ref 0.60–1.60)

## 2017-01-23 NOTE — Progress Notes (Signed)
Subjective:    Patient ID: Shawna Marquez, female    DOB: [birth year], 76 y.o.   MRN: [id number]  HPI   76 year old patient who is seen today for An annual health assessment and Medicare wellness visit.  She is followed by oncology with a history of breast cancer. She has a history of allergic rhinitis and hypothyroidism. Doing quite well.  Allergies (verified):  No Known Drug Allergies   Past History:  Past Medical History:    Osteoarthritis  Breast cancer, hx of stage IIb 1997 , status post left mastectomy  Breast cancer, right December 2015 Diverticulitis, hx of  Allergic rhinitis  obstructive sleep apnea   Past Surgical History:    Colonoscopy January 2006  4/17 Mastectomy 1997  Hysterectomy 1975  correction of a colovaginal fistula. April 2007  repair of a ventral hernia, December 2007  Bilateral cataract extraction surgery  Family History:    Family History of DJD  Family History of DVT  Family History of CAD Female 1st degree relative <50  Family History Lung cancer  Family History of Cardiovascular disorder  Family History of Respiratory disease  father died age 22 lung cancer, history of COPD, coronary artery disease, status post CABG  mother died age 44, history of COPD, peripheral neuropathy, deep vein thrombosis  No siblings  Social History:   Retired  Married  Never Smoked   Past Medical History:  Diagnosis Date  . ALLERGIC RHINITIS 10/12/2007  . BREAST CANCER, HX OF 10/12/2007   right breast diagnosed - 11/2014  . Cancer of upper-inner quadrant of female breast ([place]) 11/17/2014  . Complication of anesthesia    bp will drop,hard to wake up  . DIVERTICULITIS, HX OF 10/12/2007  . Family history of breast cancer   . HYPOTHYROIDISM, PRIMARY 10/12/2007  . OSTEOARTHRITIS 09/09/2007   back   . Radiation 04/10/15-05/25/15   Right Breast  . Sleep apnea    USES cpap NIGHTLY  . Wears glasses      Social History   Social History  .  Marital status: Married    Spouse name: N/A  . Number of children: 3  . Years of education: N/A   Occupational History  . Not on file.   Social History Main Topics  . Smoking status: Never Smoker  . Smokeless tobacco: Never Used  . Alcohol use 0.0 oz/week     Comment: occassional beer  . Drug use: No  . Sexual activity: Not on file   Other Topics Concern  . Not on file   Social History Narrative  . No narrative on file    Past Surgical History:  Procedure Laterality Date  . ABDOMINAL HYSTERECTOMY  1977  . COLON SURGERY  2006   part colectomy  . COLONOSCOPY  2007  . colovaginal fistula  2007   colon resection  . EYE SURGERY     both cataracts  . HERNIA REPAIR  2007   ventral  . MASTECTOMY  1997   lt mast-axillary node dissection  . PORTACATH PLACEMENT Left 01/01/2015   Procedure: INSERTION PORT-A-CATH;  Surgeon: Rolm Bookbinder, MD;  Location: [facility];  Service: General;  Laterality: Left;  . RADIOACTIVE SEED GUIDED MASTECTOMY WITH AXILLARY SENTINEL LYMPH NODE BIOPSY Right 11/28/2014   Procedure: RADIOACTIVE SEED GUIDED RIGHT LUMPECTOMY WITH RIGHT AXILLARY SENTINEL LYMPH NODE BIOPSY;  Surgeon: Rolm Bookbinder, MD;  Location: [facility];  Service: General;  Laterality: Right;  . RE-EXCISION OF BREAST CANCER,SUPERIOR MARGINS Right 01/01/2015  Procedure: RIGHT BREAST MARGIN EXCISION;  Surgeon: Rolm Bookbinder, MD;  Location: [facility];  Service: General;  Laterality: Right;  . TONSILLECTOMY    . WISDOM TOOTH EXTRACTION      Family History  Problem Relation Age of Onset  . Breast cancer Paternal Grandmother 66  . Lung cancer Father 14    smoker  . Prostate cancer Cousin     paternal cousin   . Heart disease Neg Hx     family hx  . Cancer Neg Hx     lung ca  . COPD Neg Hx     family hx  . Colon cancer Neg Hx     No Known Allergies  Current Outpatient Prescriptions on File Prior to Visit  Medication Sig Dispense Refill  . aspirin EC 81 MG  tablet Take 81 mg by mouth at bedtime. Reported on 02/25/2016    . Calcium Citrate-Vitamin D (CALCIUM CITRATE + D3 PO) Take 1 tablet by mouth 2 (two) times daily. Calcium 500 mg, Vitamin D 800 units    . cetirizine (ZYRTEC) 10 MG tablet Take 10 mg by mouth daily.    . cholecalciferol (VITAMIN D) 1000 UNITS tablet Take 1,000 Units by mouth daily with lunch.     . Coenzyme Q10 (COQ10) 200 MG CAPS Take 200 mg by mouth every other day.    . exemestane (AROMASIN) 25 MG tablet Take 1 tablet (25 mg total) by mouth daily after breakfast. 90 tablet 3  . fluticasone (FLONASE) 50 MCG/ACT nasal spray SHAKE WELL AND USE 1 SPRAY IN EACH NOSTRIL TWICE DAILY 48 g 3  . folic acid (FOLVITE) A999333 MCG tablet Take 400 mcg by mouth daily with lunch.     . hydrocortisone cream 1 % Apply 1 application topically daily as needed for itching (eczema).    Marland Kitchen levothyroxine (SYNTHROID, LEVOTHROID) 125 MCG tablet Take 1 tablet (125 mcg total) by mouth daily before breakfast. 90 tablet 3  . Multiple Vitamin (MULTIVITAMIN WITH MINERALS) TABS tablet Take 1 tablet by mouth daily with lunch.    . non-metallic deodorant Jethro Poling) MISC Apply 1 application topically daily as needed.    . Omega-3 Fatty Acids (FISH OIL) 1000 MG CAPS Take 1,000 mg by mouth daily with lunch.    . Turmeric 450 MG CAPS Take 450 mg by mouth daily with lunch.     . vitamin C (ASCORBIC ACID) 500 MG tablet Take 500 mg by mouth 2 (two) times daily.     No current facility-administered medications on file prior to visit.     BP 122/78 (BP Location: Right Arm, Patient Position: Sitting, Cuff Size: Normal)   Pulse 76   Temp 97.4 F (36.3 C) (Oral)   Ht 5' (1.524 m)   Wt 174 lb 6.4 oz (79.1 kg)   SpO2 98%   BMI 34.06 kg/m   Medicare wellness visit  1. Risk factors, based on past  M,S,F history.  Cardiovascular risk factors include age  76.  Physical activities:walks frequently and participates at [employer] activity is occasionally  3.  Depression/mood:no history of  major depression or mood disorder  4.  Hearing: no deficits  5.  ADL's:independent  6.  Fall risk:low to moderate due to age 76 and mild obesity  7.  Home safety:no problems identified  8.  Height weight, and visual acuity;height and weight stable no change in visual acuity  9.  Counseling:heart healthy diet modest weight loss and increase activity.  All encouraged  10. Lab orders  based on risk factors:laboratory profile will be reviewed.  Will check also vitamin D level  11. Referral :follow-up oncology  12. Care plan:continue efforts at aggressive risk factor modification.  Continue close oncology follow-up  13. Cognitive assessment: alert in order with normal affect.  No cognitive dysfunction 14. Screening: Patient provided with a written and personalized 5-10 year screening schedule in the AVS.    15. Provider List Update: primary care GI radiology dermatology dental medicine as well as oncology and general surgery    Review of Systems  Constitutional: Negative.   HENT: Negative for congestion, dental problem, hearing loss, rhinorrhea, sinus pressure, sore throat and tinnitus.   Eyes: Negative for pain, discharge and visual disturbance.  Respiratory: Negative for cough and shortness of breath.   Cardiovascular: Negative for chest pain, palpitations and leg swelling.  Gastrointestinal: Negative for abdominal distention, abdominal pain, blood in stool, constipation, diarrhea, nausea and vomiting.  Genitourinary: Negative for difficulty urinating, dysuria, flank pain, frequency, hematuria, pelvic pain, urgency, vaginal bleeding, vaginal discharge and vaginal pain.  Musculoskeletal: Negative for arthralgias, gait problem and joint swelling.       Chronic mild lymphedema left arm  Skin: Negative for rash.  Neurological: Negative for dizziness, syncope, speech difficulty, weakness, numbness and headaches.  Hematological: Negative for adenopathy.  Psychiatric/Behavioral: Negative  for agitation, behavioral problems and dysphoric mood. The patient is not nervous/anxious.        Objective:   Physical Exam  Constitutional: She is oriented to person, place, and time. She appears well-developed and well-nourished.  HENT:  Head: Normocephalic and atraumatic.  Right Ear: External ear normal.  Left Ear: External ear normal.  Mouth/Throat: Oropharynx is clear and moist.  Eyes: Conjunctivae and EOM are normal.  Neck: Normal range of motion. Neck supple. No JVD present. No thyromegaly present.  Cardiovascular: Normal rate, regular rhythm, normal heart sounds and intact distal pulses.   No murmur heard. Pulmonary/Chest: Effort normal and breath sounds normal. She has no wheezes. She has no rales.  Status post left radical mastectomy Status post right lumpectomy Axilla clear  Abdominal: Soft. Bowel sounds are normal. She exhibits no distension and no mass. There is no tenderness. There is no rebound and no guarding.  Genitourinary: Vagina normal.  Musculoskeletal: Normal range of motion. She exhibits no edema or tenderness.  Mild swelling left upper arm  Neurological: She is alert and oriented to person, place, and time. She has normal reflexes. No cranial nerve deficit. She exhibits normal muscle tone. Coordination normal.  Skin: Skin is warm and dry. No rash noted.  Psychiatric: She has a normal mood and affect. Her behavior is normal.          Assessment & Plan:   Preventive health examination Subsequent annual Medicare wellness visit Allergic rhinitis, stable History bilateral breast cancer Mild obesity weight loss encouraged Hypothyroidism.  We'll check free T4 and TSH Mild left arm lymphedema  Follow-up oncology Continue annual mammogram Follow-up here one year or as needed  Nyoka Cowden

## 2017-01-23 NOTE — Progress Notes (Signed)
Pre visit review using our clinic review tool, if applicable. No additional management support is needed unless otherwise documented below in the visit note. 

## 2017-01-23 NOTE — Patient Instructions (Signed)
Limit your sodium (Salt) intake    It is important that you exercise regularly, at least 20 minutes 3 to 4 times per week.  If you develop chest pain or shortness of breath seek  medical attention.  Oncology follow-up as scheduled  Return in one year for follow-up

## 2017-01-26 ENCOUNTER — Other Ambulatory Visit: Payer: Self-pay | Admitting: Internal Medicine

## 2017-01-26 MED ORDER — FLUTICASONE PROPIONATE 50 MCG/ACT NA SUSP: NASAL | 3 refills | Status: DC

## 2017-01-26 MED ORDER — LEVOTHYROXINE SODIUM 125 MCG PO TABS: 125.0000 ug | ORAL_TABLET | Freq: Every day | ORAL | 3 refills | Status: DC

## 2017-02-03 DIAGNOSIS — C50419 Malignant neoplasm of upper-outer quadrant of unspecified female breast: Secondary | ICD-10-CM | POA: Diagnosis not present

## 2017-02-27 DIAGNOSIS — H04123 Dry eye syndrome of bilateral lacrimal glands: Secondary | ICD-10-CM | POA: Diagnosis not present

## 2017-02-27 DIAGNOSIS — H52203 Unspecified astigmatism, bilateral: Secondary | ICD-10-CM | POA: Diagnosis not present

## 2017-02-27 DIAGNOSIS — H35371 Puckering of macula, right eye: Secondary | ICD-10-CM | POA: Diagnosis not present

## 2017-02-27 DIAGNOSIS — H43813 Vitreous degeneration, bilateral: Secondary | ICD-10-CM | POA: Diagnosis not present

## 2017-06-18 ENCOUNTER — Encounter: Payer: Self-pay | Admitting: Hematology and Oncology

## 2017-06-18 ENCOUNTER — Ambulatory Visit (HOSPITAL_BASED_OUTPATIENT_CLINIC_OR_DEPARTMENT_OTHER): Payer: Medicare Other | Admitting: Hematology and Oncology

## 2017-06-18 DIAGNOSIS — Z79811 Long term (current) use of aromatase inhibitors: Secondary | ICD-10-CM | POA: Diagnosis not present

## 2017-06-18 DIAGNOSIS — C50211 Malignant neoplasm of upper-inner quadrant of right female breast: Secondary | ICD-10-CM | POA: Diagnosis not present

## 2017-06-18 DIAGNOSIS — Z170 Estrogen receptor positive status [ER+]: Secondary | ICD-10-CM | POA: Diagnosis not present

## 2017-06-18 NOTE — Assessment & Plan Note (Signed)
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, focal vascular involvement, posterior margin involvement, 1/2 sentinel node positive, T1 cN1 M0 stage II a, Mammaprint luminal type B, high risk, disease free survival with chemotherapy and hormonal therapy 88% versus 76% with hormonal therapy alone;   Treatment summary: Adjuvant chemotherapy with Taxotere and Cytoxan every 3 weeks 4 cycles started 01/15/15 completed 03/19/15, followed by adjuvant radiation therapy 04/10/15- 05/25/2015  Started anastrozole 5 years 05/31/2015 stopped 04/03/2016 due to tendinitis involving bilateral wrists Started letrozole 04/14/2016; changed to exemestane October 2017  Exemestane toxicities: Marked improvement in arthralgias and myalgias.  Neuropathy: Improving slowly with time. We'll continue to watch and observe. Bone density 04/09/2016: Osteopenia T score -1.9 continue with calcium and vitamin D  Breast Cancer Surveillance: 1. Breast exam  09/11/2016: No abnormalities 2. Mammograms:11/25/2016  Benign  Return to clinic in 1 year for follow-up

## 2017-06-18 NOTE — Progress Notes (Signed)
Patient Care Team: Marletta Lor, MD as PCP - General Rolm Bookbinder, MD as Consulting Physician (General Surgery) Nicholas Lose, MD as Consulting Physician (Hematology and Oncology) Thea Silversmith, MD (Inactive) as Consulting Physician (Radiation Oncology) Holley Bouche, NP as Nurse Practitioner (Nurse Practitioner) Sylvan Cheese, NP as Nurse Practitioner (Nurse Practitioner)  DIAGNOSIS:  Encounter Diagnosis  Name Primary?  . 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 breast ([place])     SUMMARY OF ONCOLOGIC HISTORY:   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 breast ([place])   06/07/1996 Miscellaneous    History of Stage IIB left breast cancer S/P mastectomy and chemotherapy (Adriamycin and Cytoxan) on CALGB 9397.      11/10/2014 Mammogram    Right breast: 9 mm mass       11/13/2014 Breast US    Right breast: 9 mm mass at 1:00 position with indistinct margin, middle depth, hypoechoic.      11/15/2014 Initial Biopsy    Right breast needle biopsy: Invasive ductal carcinoma, grade 2, with DCIS with lymphovascular invasion ER+ (100%), PR+ (100%), HER-2/neu negative (ratio 1.87), Ki-67 23%      11/15/2014 Clinical Stage    Stage IA; T1b N0      11/28/2014 Definitive Surgery    Right breast lumpectomy with SLNB Donne Hazel): Invasive ductal carcinoma, grade 3, 1.7 cm, with DCIS, focal vascular involvement, posterior margin involvement, 1/2 sentinel nodes positive for malignancy, repeat HER2/neu negative (ratio 1.14)      11/28/2014 Procedure    Mammaprint: High Risk Luminal Type B.       11/28/2014 Pathologic Stage    Stage IIA: pT1c, pN1a      01/01/2015 Surgery    Rexcision of right breast margin: surgical margins negative for malignancy      01/15/2015 - 03/20/2015 Chemotherapy    Adjuvant chemotherapy with Taxotere and Cyclophosphamide X 4 cycles ([facility] Pita)      04/10/2015 - 05/25/2015 Radiation Therapy    Adjuvant XRT  completed Pablo Ledger): Right breast 45 Gy over 25 fractions; Right breast boost 16 Gy at 2 Gy over 8 fractions. Total dose 61 Gy      05/31/2015 -  Anti-estrogen oral therapy    Anastrozole 1 mg daily stopped at 04/03/2016 due to tendinitis switched to Femara 04/21/2016 discontinued June 2017, starting exemestane 09/11/2016      08/03/2015 Survivorship    Survivorship visit completed and copy of survivorship care plan provided to patient.       CHIEF COMPLIANT: follow-up on exemestane  INTERVAL HISTORY: Shawna Marquez is a 76 year old with above-mentioned history of recurrent breast cancer who is here for checkup. She reportsdoing well with exemestane therapy. She does have some pain in the right wrist but overall doing much better than previous treatments for antiestrogen therapy. She has noticed that her saliva has gotten more thicker than before but other than that she does not have any new concerns or complaints.  REVIEW OF SYSTEMS:   Constitutional: Denies fevers, chills or abnormal weight loss Eyes: Denies blurriness of vision Ears, nose, mouth, throat, and face: Denies mucositis or sore throat Respiratory: Denies cough, dyspnea or wheezes Cardiovascular: Denies palpitation, chest discomfort Gastrointestinal:  Denies nausea, heartburn or change in bowel habits Skin: Denies abnormal skin rashes Lymphatics: Denies new lymphadenopathy or easy bruising Neurological:Denies numbness, tingling or new weaknesses Behavioral/Psych: Mood is stable, no new changes  Extremities: No lower extremity edema Breast:  denies any pain or lumps or nodules  in either breasts All other systems were reviewed with the patient and are negative.  I have reviewed the past medical history, past surgical history, social history and family history with the patient and they are unchanged from previous note.  ALLERGIES:  has No Known Allergies.  MEDICATIONS:  Current Outpatient Prescriptions  Medication Sig  Dispense Refill  . aspirin EC 81 MG tablet Take 81 mg by mouth at bedtime. Reported on 02/25/2016    . Calcium Citrate-Vitamin D (CALCIUM CITRATE + D3 PO) Take 1 tablet by mouth 2 (two) times daily. Calcium 500 mg, Vitamin D 800 units    . cetirizine (ZYRTEC) 10 MG tablet Take 10 mg by mouth daily.    . cholecalciferol (VITAMIN D) 1000 UNITS tablet Take 1,000 Units by mouth daily with lunch.     . Coenzyme Q10 (COQ10) 200 MG CAPS Take 200 mg by mouth every other day.    . exemestane (AROMASIN) 25 MG tablet Take 1 tablet (25 mg total) by mouth daily after breakfast. 90 tablet 3  . fluticasone (FLONASE) 50 MCG/ACT nasal spray SHAKE WELL AND USE 1 SPRAY IN EACH NOSTRIL TWICE DAILY 48 g 3  . folic acid (FOLVITE) 329 MCG tablet Take 400 mcg by mouth daily with lunch.     . hydrocortisone cream 1 % Apply 1 application topically daily as needed for itching (eczema).    Marland Kitchen levothyroxine (SYNTHROID, LEVOTHROID) 125 MCG tablet Take 1 tablet (125 mcg total) by mouth daily before breakfast. 90 tablet 3  . Multiple Vitamin (MULTIVITAMIN WITH MINERALS) TABS tablet Take 1 tablet by mouth daily with lunch.    . non-metallic deodorant Jethro Poling) MISC Apply 1 application topically daily as needed.    . Omega-3 Fatty Acids (FISH OIL) 1000 MG CAPS Take 1,000 mg by mouth daily with lunch.    . Turmeric 450 MG CAPS Take 450 mg by mouth daily with lunch.     . vitamin C (ASCORBIC ACID) 500 MG tablet Take 500 mg by mouth 2 (two) times daily.     No current facility-administered medications for this visit.     PHYSICAL EXAMINATION: ECOG PERFORMANCE STATUS: 1 - Symptomatic but completely ambulatory  Vitals:   06/18/17 1333  BP: 131/72  Pulse: 73  Resp: 18  Temp: 98.4 F (36.9 C)   Filed Weights   06/18/17 1333  Weight: 173 lb 6.4 oz (78.7 kg)    GENERAL:alert, no distress and comfortable SKIN: skin color, texture, turgor are normal, no rashes or significant lesions EYES: normal, Conjunctiva are pink and  non-injected, sclera clear OROPHARYNX:no exudate, no erythema and lips, buccal mucosa, and tongue normal  NECK: supple, thyroid normal size, non-tender, without nodularity LYMPH:  no palpable lymphadenopathy in the cervical, axillary or inguinal LUNGS: clear to auscultation and percussion with normal breathing effort HEART: regular rate & rhythm and no murmurs and no lower extremity edema ABDOMEN:abdomen soft, non-tender and normal bowel sounds MUSCULOSKELETAL:no cyanosis of digits and no clubbing  NEURO: alert & oriented x 3 with fluent speech, no focal motor/sensory deficits EXTREMITIES: No lower extremity edema BREAST: No palpable masses or nodules in either right or left breasts. No palpable axillary supraclavicular or infraclavicular adenopathy no breast tenderness or nipple discharge. (exam performed in the presence of a chaperone)  LABORATORY DATA:  I have reviewed the data as listed   Chemistry      Component Value Date/Time   NA 140 01/23/2017 0942   NA 140 05/31/2015 0830   K 3.9  01/23/2017 0942   K 4.1 05/31/2015 0830   CL 103 01/23/2017 0942   CO2 29 01/23/2017 0942   CO2 28 05/31/2015 0830   BUN 15 01/23/2017 0942   BUN 15.4 05/31/2015 0830   CREATININE 0.69 01/23/2017 0942   CREATININE 0.6 05/31/2015 0830      Component Value Date/Time   CALCIUM 9.3 01/23/2017 0942   CALCIUM 8.8 05/31/2015 0830   ALKPHOS 71 01/23/2017 0942   ALKPHOS 82 05/31/2015 0830   AST 17 01/23/2017 0942   AST 22 05/31/2015 0830   ALT 13 01/23/2017 0942   ALT 16 05/31/2015 0830   BILITOT 0.6 01/23/2017 0942   BILITOT 0.31 05/31/2015 0830       Lab Results  Component Value Date   WBC 4.6 01/23/2017   HGB 14.0 01/23/2017   HCT 42.0 01/23/2017   MCV 94.9 01/23/2017   PLT 218.0 01/23/2017   NEUTROABS 2.9 01/23/2017    ASSESSMENT & PLAN:  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 breast 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, focal vascular  involvement, posterior margin involvement, 1/2 sentinel node positive, T1 cN1 M0 stage II a, Mammaprint luminal type B, high risk, disease free survival with chemotherapy and hormonal therapy 88% versus 76% with hormonal therapy alone;   Treatment summary: Adjuvant chemotherapy with Taxotere and Cytoxan every 3 weeks 4 cycles started 01/15/15 completed 03/19/15, followed by adjuvant radiation therapy 04/10/15- 05/25/2015  Started anastrozole 5 years 05/31/2015 stopped 04/03/2016 due to tendinitis involving bilateral wrists Started letrozole 04/14/2016; changed to exemestane October 2017  Exemestane toxicities: Marked improvement in arthralgias and myalgias.  Neuropathy: Improving slowly with time. We'll continue to watch and observe. Bone density 04/09/2016: Osteopenia T score -1.9 continue with calcium and vitamin D  Breast Cancer Surveillance: 1. Breast exam  09/11/2016: No abnormalities 2. Mammograms:11/25/2016  Benign  Return to clinic in 1 year for follow-up   I spent 25 minutes talking to the patient of which more than half was spent in counseling and coordination of care.  No orders of the defined types were placed in this encounter.  The patient has a good understanding of the overall plan. she agrees with it. she will call with any problems that may develop before the next visit here.   Rulon Eisenmenger, MD 06/18/17

## 2017-08-27 ENCOUNTER — Encounter: Payer: Self-pay | Admitting: Internal Medicine

## 2017-09-24 ENCOUNTER — Ambulatory Visit (INDEPENDENT_AMBULATORY_CARE_PROVIDER_SITE_OTHER): Payer: Medicare Other | Admitting: *Deleted

## 2017-09-24 DIAGNOSIS — Z23 Encounter for immunization: Secondary | ICD-10-CM | POA: Diagnosis not present

## 2017-11-19 ENCOUNTER — Other Ambulatory Visit: Payer: Self-pay | Admitting: Hematology and Oncology

## 2017-11-26 DIAGNOSIS — Z853 Personal history of malignant neoplasm of breast: Secondary | ICD-10-CM | POA: Diagnosis not present

## 2017-11-26 DIAGNOSIS — R921 Mammographic calcification found on diagnostic imaging of breast: Secondary | ICD-10-CM | POA: Diagnosis not present

## 2017-11-26 LAB — HM MAMMOGRAPHY

## 2017-11-27 ENCOUNTER — Encounter: Payer: Self-pay | Admitting: Internal Medicine

## 2018-01-13 DIAGNOSIS — I872 Venous insufficiency (chronic) (peripheral): Secondary | ICD-10-CM | POA: Diagnosis not present

## 2018-01-13 DIAGNOSIS — L820 Inflamed seborrheic keratosis: Secondary | ICD-10-CM | POA: Diagnosis not present

## 2018-01-13 DIAGNOSIS — L821 Other seborrheic keratosis: Secondary | ICD-10-CM | POA: Diagnosis not present

## 2018-01-26 ENCOUNTER — Other Ambulatory Visit: Payer: Self-pay | Admitting: Internal Medicine

## 2018-01-27 ENCOUNTER — Encounter: Payer: Self-pay | Admitting: Internal Medicine

## 2018-01-27 ENCOUNTER — Ambulatory Visit (INDEPENDENT_AMBULATORY_CARE_PROVIDER_SITE_OTHER): Payer: Medicare Other | Admitting: Internal Medicine

## 2018-01-27 VITALS — BP 110/62 | HR 75 | Temp 98.3°F | Ht 60.0 in | Wt 172.0 lb

## 2018-01-27 DIAGNOSIS — Z86010 Personal history of colonic polyps: Secondary | ICD-10-CM

## 2018-01-27 DIAGNOSIS — Z0000 Encounter for general adult medical examination without abnormal findings: Secondary | ICD-10-CM | POA: Diagnosis not present

## 2018-01-27 DIAGNOSIS — M159 Polyosteoarthritis, unspecified: Secondary | ICD-10-CM

## 2018-01-27 DIAGNOSIS — E785 Hyperlipidemia, unspecified: Secondary | ICD-10-CM

## 2018-01-27 DIAGNOSIS — Z9989 Dependence on other enabling machines and devices: Secondary | ICD-10-CM | POA: Diagnosis not present

## 2018-01-27 DIAGNOSIS — E039 Hypothyroidism, unspecified: Secondary | ICD-10-CM | POA: Diagnosis not present

## 2018-01-27 DIAGNOSIS — M150 Primary generalized (osteo)arthritis: Secondary | ICD-10-CM | POA: Diagnosis not present

## 2018-01-27 DIAGNOSIS — G4733 Obstructive sleep apnea (adult) (pediatric): Secondary | ICD-10-CM

## 2018-01-27 DIAGNOSIS — J301 Allergic rhinitis due to pollen: Secondary | ICD-10-CM | POA: Diagnosis not present

## 2018-01-27 LAB — COMPREHENSIVE METABOLIC PANEL WITH GFR
ALT: 14 U/L (ref 0–35)
AST: 16 U/L (ref 0–37)
Albumin: 3.9 g/dL (ref 3.5–5.2)
Alkaline Phosphatase: 72 U/L (ref 39–117)
BUN: 15 mg/dL (ref 6–23)
CO2: 31 meq/L (ref 19–32)
Calcium: 9.6 mg/dL (ref 8.4–10.5)
Chloride: 102 meq/L (ref 96–112)
Creatinine, Ser: 0.76 mg/dL (ref 0.40–1.20)
GFR: 78.03 mL/min
Glucose, Bld: 86 mg/dL (ref 70–99)
Potassium: 4.1 meq/L (ref 3.5–5.1)
Sodium: 140 meq/L (ref 135–145)
Total Bilirubin: 0.6 mg/dL (ref 0.2–1.2)
Total Protein: 6.6 g/dL (ref 6.0–8.3)

## 2018-01-27 LAB — LIPID PANEL
Cholesterol: 169 mg/dL (ref 0–200)
HDL: 49.5 mg/dL
LDL Cholesterol: 103 mg/dL — ABNORMAL HIGH (ref 0–99)
NonHDL: 119.23
Total CHOL/HDL Ratio: 3
Triglycerides: 82 mg/dL (ref 0.0–149.0)
VLDL: 16.4 mg/dL (ref 0.0–40.0)

## 2018-01-27 LAB — CBC WITH DIFFERENTIAL/PLATELET
Basophils Absolute: 0 K/uL (ref 0.0–0.1)
Basophils Relative: 0.8 % (ref 0.0–3.0)
Eosinophils Absolute: 0.2 K/uL (ref 0.0–0.7)
Eosinophils Relative: 3.8 % (ref 0.0–5.0)
HCT: 41.5 % (ref 36.0–46.0)
Hemoglobin: 14 g/dL (ref 12.0–15.0)
Lymphocytes Relative: 26.2 % (ref 12.0–46.0)
Lymphs Abs: 1.2 K/uL (ref 0.7–4.0)
MCHC: 33.8 g/dL (ref 30.0–36.0)
MCV: 94.6 fl (ref 78.0–100.0)
Monocytes Absolute: 0.4 K/uL (ref 0.1–1.0)
Monocytes Relative: 9.2 % (ref 3.0–12.0)
Neutro Abs: 2.7 K/uL (ref 1.4–7.7)
Neutrophils Relative %: 60 % (ref 43.0–77.0)
Platelets: 221 K/uL (ref 150.0–400.0)
RBC: 4.39 Mil/uL (ref 3.87–5.11)
RDW: 12.9 % (ref 11.5–15.5)
WBC: 4.4 K/uL (ref 4.0–10.5)

## 2018-01-27 LAB — T4, FREE: FREE T4: 1.08 ng/dL (ref 0.60–1.60)

## 2018-01-27 LAB — TSH: TSH: 0.45 u[IU]/mL (ref 0.35–4.50)

## 2018-01-27 NOTE — Patient Instructions (Signed)
Limit your sodium (Salt) intake  Pulmonary consultation as discussed  Oncology follow-up as scheduled    It is important that you exercise regularly, at least 20 minutes 3 to 4 times per week.  If you develop chest pain or shortness of breath seek  medical attention.  Return in one year for follow-up

## 2018-01-27 NOTE — Progress Notes (Signed)
Subjective:    Patient ID: Shawna Marquez, female    DOB: Jul 29, 1941, 77 y.o.   MRN: [id number]  HPI  77 year-old patient who is seen today for An annual health assessment and Medicare wellness visit.  She is followed by oncology with a history of breast cancer.  She is scheduled for follow-up in July 2019 She has a history of allergic rhinitis and hypothyroidism. Doing quite well.  Allergies (verified):  No Known Drug Allergies   Past History:  Past Medical History:    Osteoarthritis  Breast cancer, hx of stage IIb 1997 , status post left mastectomy  Breast cancer, right December 2015 Diverticulitis, hx of  Allergic rhinitis  obstructive sleep apnea.  Diagnosed 2005.  Requesting a mini CPAP device for travel  Past Surgical History:    Colonoscopy January 2006  4/17 Mastectomy 1997  Hysterectomy 1975  correction of a colovaginal fistula. April 2007  repair of a ventral hernia, December 2007  Bilateral cataract extraction surgery  Family History:    Family History of DJD  Family History of DVT  Family History of CAD Female 1st degree relative <50  Family History Lung cancer  Family History of Cardiovascular disorder  Family History of Respiratory disease  father died age 99 lung cancer, history of COPD, coronary artery disease, status post CABG  mother died age 78, history of COPD, peripheral neuropathy, deep vein thrombosis  No siblings  Social History:   Retired  Married  Never Smoked   Past Medical History:  Diagnosis Date  . ALLERGIC RHINITIS 10/12/2007  . BREAST CANCER, HX OF 10/12/2007   right breast diagnosed - 11/2014  . Cancer of upper-inner quadrant of female breast ([facility]) 11/17/2014  . Complication of anesthesia    bp will drop,hard to wake up  . DIVERTICULITIS, HX OF 10/12/2007  . Family history of breast cancer   . HYPOTHYROIDISM, PRIMARY 10/12/2007  . OSTEOARTHRITIS 09/09/2007   back   . Radiation 04/10/15-05/25/15   Right Breast    . Sleep apnea    USES cpap NIGHTLY  . Wears glasses      Social History   Socioeconomic History  . Marital status: Married    Spouse name: Not on file  . Number of children: 3  . Years of education: Not on file  . Highest education level: Not on file  Social Needs  . Financial resource strain: Not on file  . Food insecurity - worry: Not on file  . Food insecurity - inability: Not on file  . Transportation needs - medical: Not on file  . Transportation needs - non-medical: Not on file  Occupational History  . Not on file  Tobacco Use  . Smoking status: Never Smoker  . Smokeless tobacco: Never Used  Substance and Sexual Activity  . Alcohol use: Yes    Alcohol/week: 0.0 oz    Comment: occassional beer  . Drug use: No  . Sexual activity: Not on file  Other Topics Concern  . Not on file  Social History Narrative  . Not on file    Past Surgical History:  Procedure Laterality Date  . ABDOMINAL HYSTERECTOMY  1977  . COLON SURGERY  2006   part colectomy  . COLONOSCOPY  2007  . colovaginal fistula  2007   colon resection  . EYE SURGERY     both cataracts  . HERNIA REPAIR  2007   ventral  . MASTECTOMY  1997   lt mast-axillary node dissection  .  PORTACATH PLACEMENT Left 01/01/2015   Procedure: INSERTION PORT-A-CATH;  Surgeon: Rolm Bookbinder, MD;  Location: [facility];  Service: General;  Laterality: Left;  . RADIOACTIVE SEED GUIDED PARTIAL MASTECTOMY WITH AXILLARY SENTINEL LYMPH NODE BIOPSY Right 11/28/2014   Procedure: RADIOACTIVE SEED GUIDED RIGHT LUMPECTOMY WITH RIGHT AXILLARY SENTINEL LYMPH NODE BIOPSY;  Surgeon: Rolm Bookbinder, MD;  Location: [facility];  Service: General;  Laterality: Right;  . RE-EXCISION OF BREAST CANCER,SUPERIOR MARGINS Right 01/01/2015   Procedure: RIGHT BREAST MARGIN EXCISION;  Surgeon: Rolm Bookbinder, MD;  Location: [facility];  Service: General;  Laterality: Right;  . TONSILLECTOMY    . WISDOM TOOTH EXTRACTION      Family  History  Problem Relation Age of Onset  . Breast cancer Paternal Grandmother 83  . Lung cancer Father 17       smoker  . Prostate cancer Cousin        paternal cousin   . Heart disease Neg Hx        family hx  . Cancer Neg Hx        lung ca  . COPD Neg Hx        family hx  . Colon cancer Neg Hx     No Known Allergies  Current Outpatient Medications on File Prior to Visit  Medication Sig Dispense Refill  . aspirin EC 81 MG tablet Take 81 mg by mouth at bedtime. Reported on 02/25/2016    . Calcium Citrate-Vitamin D (CALCIUM CITRATE + D3 PO) Take 1 tablet by mouth 2 (two) times daily. Calcium 500 mg, Vitamin D 800 units    . cetirizine (ZYRTEC) 10 MG tablet Take 10 mg by mouth daily.    . cholecalciferol (VITAMIN D) 1000 UNITS tablet Take 1,000 Units by mouth daily with lunch.     . Coenzyme Q10 (COQ10) 200 MG CAPS Take 200 mg by mouth every other day.    . exemestane (AROMASIN) 25 MG tablet TAKE 1 TABLET BY MOUTH EVERY DAY AFTER BREAKFAST 90 tablet 3  . fluticasone (FLONASE) 50 MCG/ACT nasal spray SHAKE WELL AND USE 1 SPRAY IN EACH NOSTRIL TWICE DAILY 48 g 3  . folic acid (FOLVITE) 810 MCG tablet Take 400 mcg by mouth daily with lunch.     . hydrocortisone cream 1 % Apply 1 application topically daily as needed for itching (eczema).    Marland Kitchen levothyroxine (SYNTHROID, LEVOTHROID) 125 MCG tablet Take 1 tablet (125 mcg total) by mouth daily before breakfast. 90 tablet 3  . Multiple Vitamin (MULTIVITAMIN WITH MINERALS) TABS tablet Take 1 tablet by mouth daily with lunch.    . non-metallic deodorant Jethro Poling) MISC Apply 1 application topically daily as needed.    . Omega-3 Fatty Acids (FISH OIL) 1000 MG CAPS Take 1,000 mg by mouth daily with lunch.    . Turmeric 450 MG CAPS Take 450 mg by mouth daily with lunch.     . vitamin C (ASCORBIC ACID) 500 MG tablet Take 500 mg by mouth 2 (two) times daily.    . clobetasol cream (TEMOVATE) 0.05 % APPLY TO AFFECTED AREA UP TO 2 TIMES DAILY AS NEEDED.NOT  FACE,GROIN OR AXILLA  3   No current facility-administered medications on file prior to visit.     BP 110/62 (BP Location: Right Arm, Patient Position: Sitting, Cuff Size: Large)   Pulse 75   Temp 98.3 F (36.8 C) (Oral)   Ht 5' (1.524 m)   Wt 172 lb (78 kg)   SpO2  96%   BMI 33.59 kg/m   Subsequent Medicare wellness visit  1. Risk factors, based on past  M,S,F history.  Cardiovascular risk factors include age  5.  Physical activities:walks frequently and participates at [employer] activity is occasionally  3.  Depression/mood:no history of major depression or mood disorder  4.  Hearing: no deficits  5.  ADL's:independent  6.  Fall risk:low to moderate due to age and mild obesity  7.  Home safety:no problems identified  8.  Height weight, and visual acuity;height and weight stable no change in visual acuity.  She is followed by ophthalmology annually  9.  Counseling:heart healthy diet modest weight loss and increase activity.  All encouraged  10. Lab orders based on risk factors:laboratory profile will be reviewed.  Will check also vitamin D level  11. Referral :follow-up oncology and ophthalmology  12. Care plan:continue efforts at aggressive risk factor modification.  Continue close oncology follow-up  13. Cognitive assessment: alert in order with normal affect.  No cognitive dysfunction 14. Screening: Patient provided with a written and personalized 5-10 year screening schedule in the AVS.    15. Provider List Update: primary care GI radiology dermatology dental medicine as well as oncology and general surgery  Review of Systems  Constitutional: Negative.   HENT: Negative for congestion, dental problem, hearing loss, rhinorrhea, sinus pressure, sore throat and tinnitus.   Eyes: Negative for pain, discharge and visual disturbance.  Respiratory: Negative for cough and shortness of breath.   Cardiovascular: Negative for chest pain, palpitations and leg swelling.   Gastrointestinal: Negative for abdominal distention, abdominal pain, blood in stool, constipation, diarrhea, nausea and vomiting.  Genitourinary: Negative for difficulty urinating, dysuria, flank pain, frequency, hematuria, pelvic pain, urgency, vaginal bleeding, vaginal discharge and vaginal pain.  Musculoskeletal: Negative for arthralgias, gait problem and joint swelling.  Skin: Negative for rash.       Complaining of brittle fingernails  Neurological: Negative for dizziness, syncope, speech difficulty, weakness, numbness and headaches.  Hematological: Negative for adenopathy.  Psychiatric/Behavioral: Negative for agitation, behavioral problems and dysphoric mood. The patient is not nervous/anxious.        Objective:   Physical Exam  Constitutional: She is oriented to person, place, and time. She appears well-developed and well-nourished.  HENT:  Head: Normocephalic and atraumatic.  Right Ear: External ear normal.  Left Ear: External ear normal.  Mouth/Throat: Oropharynx is clear and moist.  Pharyngeal crowding  Eyes: Conjunctivae and EOM are normal.  Neck: Normal range of motion. Neck supple. No JVD present. No thyromegaly present.  Cardiovascular: Normal rate, regular rhythm, normal heart sounds and intact distal pulses.  No murmur heard. Pulmonary/Chest: Effort normal and breath sounds normal. She has no wheezes. She has no rales.  Status post left mastectomy Status post partial mastectomy and lumpectomy is on the right  Axilla clear  Abdominal: Soft. Bowel sounds are normal. She exhibits no distension and no mass. There is no tenderness. There is no rebound and no guarding.  Musculoskeletal: Normal range of motion. She exhibits edema. She exhibits no tenderness.  Chronic left arm lymphedema  Neurological: She is alert and oriented to person, place, and time. She has normal reflexes. No cranial nerve deficit. She exhibits normal muscle tone. Coordination normal.  Skin: Skin is  warm and dry. No rash noted.  Psychiatric: She has a normal mood and affect. Her behavior is normal.          Assessment & Plan:   Preventive health examination Subsequent  annual Medicare wellness visit Allergic rhinitis, stable History bilateral breast cancer.  Follow-up oncology July 2019 Mild obesity weight loss encouraged Hypothyroidism.  We'll check free T4 and TSH Mild left arm lymphedema OSA on CPAP.  We will schedule pulmonary follow-up patient is requesting a travel size CPAP device.  Follow-up oncology Continue annual mammogram.  Follow-up in June 2019 scheduled Follow-up here one year or as needed  [employer]

## 2018-02-03 ENCOUNTER — Encounter: Payer: Self-pay | Admitting: Internal Medicine

## 2018-02-04 ENCOUNTER — Encounter: Payer: Self-pay | Admitting: Internal Medicine

## 2018-02-04 ENCOUNTER — Ambulatory Visit (INDEPENDENT_AMBULATORY_CARE_PROVIDER_SITE_OTHER): Payer: Medicare Other | Admitting: Internal Medicine

## 2018-02-04 VITALS — BP 112/66 | HR 85 | Ht 60.0 in | Wt 174.6 lb

## 2018-02-04 DIAGNOSIS — G4733 Obstructive sleep apnea (adult) (pediatric): Secondary | ICD-10-CM | POA: Diagnosis not present

## 2018-02-04 DIAGNOSIS — Z9989 Dependence on other enabling machines and devices: Secondary | ICD-10-CM

## 2018-02-04 NOTE — Patient Instructions (Addendum)
Order- [facility]- Please replace old CPAP machine, change to auto 5-12, mask of choice, humidifier, supplies, AirView   Dx OSA     No break in therapy.  Okay to increase pressure on her current machine from 8-9 CWP.  Printed script for small travel CPAP machine, auto 5-12 mask of choice, hoses and supplies  dx OSA  Please call if we can help

## 2018-02-04 NOTE — Progress Notes (Signed)
02/04/18-77 year old female never smoker for sleep evaluation.Sleep Consult: Dr, Jonathon Resides currently on CPAP through DME. [facility]. DL attached. No break in usage of CPAP. Sleep Study in 2005. Problem list includes allergic rhinitis, history of R  breast cancer, hypothyroid NPSG 04/21/11- AHI 47/ hr, desaturation to 85%, CPAP titrated to 8 She has been using CPAP compliantly with good control of snoring and daytime sleepiness.  This winter she has not slept as well, waking for 2 or 3 hours during the night.  She questions if the machine pressure is not quite enough.  She is also interested in the possibility of a small travel CPAP machine for upcoming travel.  Current machine is about 79 years old. ENT surgery-tonsils.  Denies heart or lung disease. Epworth score 4  Prior to Admission medications   Medication Sig Start Date End Date Taking? Authorizing Provider  aspirin EC 81 MG tablet Take 81 mg by mouth at bedtime. Reported on 02/25/2016   Yes [provider]  Calcium Citrate-Vitamin D (CALCIUM CITRATE + D3 PO) Take 1 tablet by mouth 2 (two) times daily. Calcium 500 mg, Vitamin D 800 units   Yes [provider]  cetirizine (ZYRTEC) 10 MG tablet Take 10 mg by mouth daily.   Yes [provider]  cholecalciferol (VITAMIN D) 1000 UNITS tablet Take 1,000 Units by mouth daily with lunch.    Yes [provider]  clobetasol cream (TEMOVATE) 0.05 % APPLY TO AFFECTED AREA UP TO 2 TIMES DAILY AS NEEDED.NOT FACE,GROIN OR AXILLA 01/13/18  Yes [provider]  Coenzyme Q10 (COQ10) 200 MG CAPS Take 200 mg by mouth every other day.   Yes [provider]  exemestane (AROMASIN) 25 MG tablet TAKE 1 TABLET BY MOUTH EVERY DAY AFTER BREAKFAST 11/19/17  Yes Nicholas Lose, MD  fluticasone (FLONASE) 50 MCG/ACT nasal spray SHAKE WELL AND USE 1 SPRAY IN EACH NOSTRIL TWICE DAILY 01/26/18  Yes Marletta Lor, MD  folic acid (FOLVITE) 188 MCG tablet Take 400 mcg by mouth  daily with lunch.    Yes [provider]  hydrocortisone cream 1 % Apply 1 application topically daily as needed for itching (eczema).   Yes [provider]  levothyroxine (SYNTHROID, LEVOTHROID) 125 MCG tablet Take 1 tablet (125 mcg total) by mouth daily before breakfast. 01/26/17  Yes Marletta Lor, MD  Multiple Vitamin (MULTIVITAMIN WITH MINERALS) TABS tablet Take 1 tablet by mouth daily with lunch.   Yes [provider]  non-metallic deodorant Jethro Poling) MISC Apply 1 application topically daily as needed.   Yes [provider]  Omega-3 Fatty Acids (FISH OIL) 1000 MG CAPS Take 1,000 mg by mouth daily with lunch.   Yes [provider]  Turmeric 450 MG CAPS Take 450 mg by mouth daily with lunch.    Yes [provider]  vitamin C (ASCORBIC ACID) 500 MG tablet Take 500 mg by mouth 2 (two) times daily.   Yes [provider]   Past Medical History:  Diagnosis Date  . ALLERGIC RHINITIS 10/12/2007  . BREAST CANCER, HX OF 10/12/2007   right breast diagnosed - 11/2014  . Cancer of upper-inner quadrant of female breast ([facility]) 11/17/2014  . Complication of anesthesia    bp will drop,hard to wake up  . DIVERTICULITIS, HX OF 10/12/2007  . Family history of breast cancer   . HYPOTHYROIDISM, PRIMARY 10/12/2007  . OSTEOARTHRITIS 09/09/2007   back   . Radiation 04/10/15-05/25/15   Right Breast  . Sleep apnea  USES cpap NIGHTLY  . Wears glasses    Past Surgical History:  Procedure Laterality Date  . ABDOMINAL HYSTERECTOMY  1977  . COLON SURGERY  2006   part colectomy  . COLONOSCOPY  2007  . colovaginal fistula  2007   colon resection  . EYE SURGERY     both cataracts  . HERNIA REPAIR  2007   ventral  . MASTECTOMY  1997   lt mast-axillary node dissection  . PORTACATH PLACEMENT Left 01/01/2015   Procedure: INSERTION PORT-A-CATH;  Surgeon: Rolm Bookbinder, MD;  Location: [facility];  Service: General;  Laterality: Left;  . RADIOACTIVE  SEED GUIDED PARTIAL MASTECTOMY WITH AXILLARY SENTINEL LYMPH NODE BIOPSY Right 11/28/2014   Procedure: RADIOACTIVE SEED GUIDED RIGHT LUMPECTOMY WITH RIGHT AXILLARY SENTINEL LYMPH NODE BIOPSY;  Surgeon: Rolm Bookbinder, MD;  Location: [facility];  Service: General;  Laterality: Right;  . RE-EXCISION OF BREAST CANCER,SUPERIOR MARGINS Right 01/01/2015   Procedure: RIGHT BREAST MARGIN EXCISION;  Surgeon: Rolm Bookbinder, MD;  Location: [facility];  Service: General;  Laterality: Right;  . TONSILLECTOMY    . WISDOM TOOTH EXTRACTION     Family History  Problem Relation Age of Onset  . Breast cancer Paternal Grandmother 13  . Lung cancer Father 6       smoker  . Prostate cancer Cousin        paternal cousin   . Heart disease Neg Hx        family hx  . Cancer Neg Hx        lung ca  . COPD Neg Hx        family hx  . Colon cancer Neg Hx    Social History   Socioeconomic History  . Marital status: Married    Spouse name: Not on file  . Number of children: 3  . Years of education: Not on file  . Highest education level: Not on file  Social Needs  . Financial resource strain: Not on file  . Food insecurity - worry: Not on file  . Food insecurity - inability: Not on file  . Transportation needs - medical: Not on file  . Transportation needs - non-medical: Not on file  Occupational History  . Not on file  Tobacco Use  . Smoking status: Never Smoker  . Smokeless tobacco: Never Used  Substance and Sexual Activity  . Alcohol use: Yes    Alcohol/week: 0.0 oz    Comment: occassional beer  . Drug use: No  . Sexual activity: Not on file  Other Topics Concern  . Not on file  Social History Narrative  . Not on file   ROS-see HPI + = positive Constitutional:    weight loss, night sweats, fevers, chills, fatigue, lassitude. HEENT:    headaches, difficulty swallowing, tooth/dental problems, sore throat,       Sneezing,+ itching, ear ache, +nasal congestion, post nasal drip,  snoring CV:    chest pain, orthopnea, PND, swelling in lower extremities, anasarca,                                  dizziness, palpitations Resp:   shortness of breath with exertion or at rest.                productive cough,   non-productive cough, coughing up of blood.              change  in color of mucus.  wheezing.   Skin:    rash or lesions. GI:  No-   heartburn, indigestion, abdominal pain, nausea, vomiting, diarrhea,                 change in bowel habits, loss of appetite GU: dysuria, change in color of urine, no urgency or frequency.   flank pain. MS:   joint pain, stiffness, decreased range of motion, back pain. Neuro-     nothing unusual Psych:  change in mood or affect.  depression or anxiety.   memory loss.  OBJ- Physical Exam General- Alert, Oriented, Affect-appropriate, Distress- none acute Skin- rash-none, lesions- none, excoriation- none Lymphadenopathy- none Head- atraumatic            Eyes- Gross vision intact, PERRLA, conjunctivae and secretions clear            Ears- Hearing, canals-normal            Nose- Clear, no-Septal dev, mucus, polyps, erosion, perforation             Throat- Mallampati IV , mucosa clear , drainage- none, tonsils- atrophic Neck- flexible , trachea midline, no stridor , thyroid nl, carotid no bruit Chest - symmetrical excursion , unlabored           Heart/CV- RRR , no murmur , no gallop  , no rub, nl s1 s2                           - JVD- none , edema- none, stasis changes- none, varices- none           Lung- clear to P&A, wheeze- none, cough- none , dullness-none, rub- none           Chest wall-  Abd-  Br/ Gen/ Rectal- Not done, not indicated Extrem- cyanosis- none, clubbing, none, atrophy- none, strength- nl Neuro- grossly intact to observation

## 2018-02-05 NOTE — Assessment & Plan Note (Signed)
She benefits from CPAP-sleeping much better.  Comfortable and compliant.  Not sure why she has had insomnia recently, but agree with her suggestion that we try increasing the pressure slightly on her old machine.  Meanwhile she should be eligible for change to a new machine with AutoPap.  She would also like printed prescription so she can look into getting a small travel CPAP machine-discussed. Plan-increase pressure on old machine from 8-9 CWP.  New CPAP machine auto 5-12.  Printed prescription for travel CPAP.

## 2018-03-01 ENCOUNTER — Encounter: Payer: Self-pay | Admitting: Internal Medicine

## 2018-03-03 ENCOUNTER — Other Ambulatory Visit: Payer: Self-pay | Admitting: Internal Medicine

## 2018-03-09 DIAGNOSIS — H04123 Dry eye syndrome of bilateral lacrimal glands: Secondary | ICD-10-CM | POA: Diagnosis not present

## 2018-03-09 DIAGNOSIS — H35371 Puckering of macula, right eye: Secondary | ICD-10-CM | POA: Diagnosis not present

## 2018-03-09 DIAGNOSIS — H26493 Other secondary cataract, bilateral: Secondary | ICD-10-CM | POA: Diagnosis not present

## 2018-03-09 DIAGNOSIS — H52203 Unspecified astigmatism, bilateral: Secondary | ICD-10-CM | POA: Diagnosis not present

## 2018-05-12 ENCOUNTER — Encounter: Payer: Self-pay | Admitting: Internal Medicine

## 2018-05-14 ENCOUNTER — Ambulatory Visit (INDEPENDENT_AMBULATORY_CARE_PROVIDER_SITE_OTHER): Payer: Medicare Other | Admitting: Internal Medicine

## 2018-05-14 ENCOUNTER — Encounter: Payer: Self-pay | Admitting: Internal Medicine

## 2018-05-14 DIAGNOSIS — Z9989 Dependence on other enabling machines and devices: Secondary | ICD-10-CM | POA: Diagnosis not present

## 2018-05-14 DIAGNOSIS — G4733 Obstructive sleep apnea (adult) (pediatric): Secondary | ICD-10-CM | POA: Diagnosis not present

## 2018-05-14 DIAGNOSIS — E039 Hypothyroidism, unspecified: Secondary | ICD-10-CM | POA: Diagnosis not present

## 2018-05-14 NOTE — Patient Instructions (Signed)
We can continue CPAP auto 5-12, mask of choice, humidifier, supplies, Airview  Please call if we can help

## 2018-05-14 NOTE — Assessment & Plan Note (Signed)
She reports thyroid status is under control, managed by her PCP with no concerns.

## 2018-05-14 NOTE — Assessment & Plan Note (Signed)
She continues to do well with CPAP since starting in 2005.  Current machine functioning very well and her compliance remains excellent as documented by download.  Pressure range she is sufficient but we can extend it if needed later.  She benefits with improved sleep quality. Plan-continue CPAP auto 5-12

## 2018-05-14 NOTE — Progress Notes (Signed)
HPI female never smoker followed for OSA, complicated by allergic rhinitis, history of right breast cancer, hypothyroid NPSG 04/21/11- AHI 47/ hr, desaturation to 85%, CPAP titrated to 8  ----------------------------------------------------------------------------------------------------------- 02/04/18-77 year old female never smoker for sleep evaluation.Sleep Consult: Dr, Jonathon Resides currently on CPAP through DME. [facility]. DL attached. No break in usage of CPAP. Sleep Study in 2005. Problem list includes allergic rhinitis, history of R  breast cancer, hypothyroid NPSG 04/21/11- AHI 47/ hr, desaturation to 85%, CPAP titrated to 8 She has been using CPAP compliantly with good control of snoring and daytime sleepiness.  This winter she has not slept as well, waking for 2 or 3 hours during the night.  She questions if the machine pressure is not quite enough.  She is also interested in the possibility of a small travel CPAP machine for upcoming travel.  Current machine is about 79 years old. ENT surgery-tonsils.  Denies heart or lung disease. Epworth score 4  05/14/2018-77 year old female never smoker followed for OSA, complicated by allergic rhinitis, history of right breast cancer, hypothyroid ----OSA DME: [facility]. Pt wears CPAP nightly and DL attached. Pressure works well per patient and no new supplies needed at this time.  CPAP auto 5-12/[facility] She likes her replacement CPAP machine which is working quite well for her now. Download compliance 100% AHI 2.4/hour. Denies other medical concerns.  About to leave for a family trip to [place] and [place].  ROS-see HPI + = positive Constitutional:    weight loss, night sweats, fevers, chills, fatigue, lassitude. HEENT:    headaches, difficulty swallowing, tooth/dental problems, sore throat,       Sneezing,+ itching, ear ache, +nasal congestion, post nasal drip, snoring CV:    chest pain, orthopnea, PND, swelling in lower extremities, anasarca,                                                    izziness, palpitations Resp:   shortness of breath with exertion or at rest.                productive cough,   non-productive cough, coughing up of blood.              change in color of mucus.  wheezing.   Skin:    rash or lesions. GI:  No-   heartburn, indigestion, abdominal pain, nausea, vomiting, diarrhea,                 change in bowel habits, loss of appetite GU: dysuria, change in color of urine, no urgency or frequency.   flank pain. MS:   joint pain, stiffness, decreased range of motion, back pain. Neuro-     nothing unusual Psych:  change in mood or affect.  depression or anxiety.   memory loss.  OBJ- Physical Exam General- Alert, Oriented, Affect-appropriate, Distress- none acute + obese Skin- rash-none, lesions- none, excoriation- none Lymphadenopathy- none Head- atraumatic            Eyes- Gross vision intact, PERRLA, conjunctivae and secretions clear            Ears- Hearing, canals-normal            Nose- Clear, no-Septal dev, mucus, polyps, erosion, perforation             Throat- Mallampati IV , mucosa clear , drainage- none, tonsils-  atrophic Neck- flexible , trachea midline, no stridor , thyroid nl, carotid no bruit Chest - symmetrical excursion , unlabored           Heart/CV- RRR , no murmur , no gallop  , no rub, nl s1 s2                           - JVD- none , edema- none, stasis changes- none, varices- none           Lung- clear to P&A, wheeze- none, cough- none , dullness-none, rub- none           Chest wall-  Abd-  Br/ Gen/ Rectal- Not done, not indicated Extrem- cyanosis- none, clubbing, none, atrophy- none, strength- nl Neuro- grossly intact to observation

## 2018-06-08 ENCOUNTER — Encounter: Payer: Self-pay | Admitting: Internal Medicine

## 2018-06-08 DIAGNOSIS — R928 Other abnormal and inconclusive findings on diagnostic imaging of breast: Secondary | ICD-10-CM | POA: Diagnosis not present

## 2018-06-08 DIAGNOSIS — Z853 Personal history of malignant neoplasm of breast: Secondary | ICD-10-CM | POA: Diagnosis not present

## 2018-06-08 LAB — HM MAMMOGRAPHY

## 2018-06-18 ENCOUNTER — Inpatient Hospital Stay: Payer: Medicare Other | Attending: Hematology and Oncology | Admitting: Hematology and Oncology

## 2018-06-18 DIAGNOSIS — C50211 Malignant neoplasm of upper-inner quadrant of right female breast: Secondary | ICD-10-CM | POA: Insufficient documentation

## 2018-06-18 DIAGNOSIS — Z79899 Other long term (current) drug therapy: Secondary | ICD-10-CM

## 2018-06-18 DIAGNOSIS — Z170 Estrogen receptor positive status [ER+]: Secondary | ICD-10-CM | POA: Insufficient documentation

## 2018-06-18 DIAGNOSIS — M8580 Other specified disorders of bone density and structure, unspecified site: Secondary | ICD-10-CM

## 2018-06-18 DIAGNOSIS — M791 Myalgia, unspecified site: Secondary | ICD-10-CM | POA: Diagnosis not present

## 2018-06-18 DIAGNOSIS — Z9221 Personal history of antineoplastic chemotherapy: Secondary | ICD-10-CM | POA: Diagnosis not present

## 2018-06-18 DIAGNOSIS — Z923 Personal history of irradiation: Secondary | ICD-10-CM | POA: Insufficient documentation

## 2018-06-18 DIAGNOSIS — G629 Polyneuropathy, unspecified: Secondary | ICD-10-CM

## 2018-06-18 MED ORDER — EXEMESTANE 25 MG PO TABS: ORAL_TABLET | ORAL | 3 refills | Status: DC

## 2018-06-18 NOTE — Assessment & Plan Note (Signed)
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, focal vascular involvement, posterior margin involvement, 1/2 sentinel node positive, T1 cN1 M0 stage II a, Mammaprint luminal type B, high risk, disease free survival with chemotherapy and hormonal therapy 88% versus 76% with hormonal therapy alone;   Treatment summary: Adjuvant chemotherapy with Taxotere and Cytoxan every 3 weeks 4 cycles started 01/15/15 completed 03/19/15, followed by adjuvant radiation therapy 04/10/15- 05/25/2015  Started anastrozole 5 years 05/31/2015 stopped 04/03/2016 due to tendinitis involving bilateral wrists Started letrozole 04/14/2016; changed to exemestane October 2017  Exemestanetoxicities: Marked improvement in arthralgias and myalgias.  Neuropathy: Improving slowly with time. We'll continue to watch and observe. Bone density 04/09/2016: Osteopenia T score -1.9 continue with calcium and vitamin D  Breast Cancer Surveillance: 1. Breast exam 06/18/2018: No abnormalities 2. Mammograms: 11/26/2017 at [facility]: Benign  Return to clinic in 1 year for follow-up

## 2018-06-18 NOTE — Progress Notes (Signed)
Patient Care Team: Marletta Lor, MD as PCP - General Rolm Bookbinder, MD as Consulting Physician (General Surgery) Nicholas Lose, MD as Consulting Physician (Hematology and Oncology) Thea Silversmith, MD as Consulting Physician (Radiation Oncology) Holley Bouche, NP as Nurse Practitioner (Nurse Practitioner) Sylvan Cheese, NP as Nurse Practitioner (Nurse Practitioner)  DIAGNOSIS:  Encounter Diagnosis  Name Primary?  . 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 breast ([place])     SUMMARY OF ONCOLOGIC HISTORY:   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 breast ([place])   06/07/1996 Miscellaneous    History of Stage IIB left breast cancer S/P mastectomy and chemotherapy (Adriamycin and Cytoxan) on CALGB 9397.      11/10/2014 Mammogram    Right breast: 9 mm mass       11/13/2014 Breast US    Right breast: 9 mm mass at 1:00 position with indistinct margin, middle depth, hypoechoic.      11/15/2014 Initial Biopsy    Right breast needle biopsy: Invasive ductal carcinoma, grade 2, with DCIS with lymphovascular invasion ER+ (100%), PR+ (100%), HER-2/neu negative (ratio 1.87), Ki-67 23%      11/15/2014 Clinical Stage    Stage IA; T1b N0      11/28/2014 Definitive Surgery    Right breast lumpectomy with SLNB Donne Hazel): Invasive ductal carcinoma, grade 3, 1.7 cm, with DCIS, focal vascular involvement, posterior margin involvement, 1/2 sentinel nodes positive for malignancy, repeat HER2/neu negative (ratio 1.14)      11/28/2014 Procedure    Mammaprint: High Risk Luminal Type B.       11/28/2014 Pathologic Stage    Stage IIA: pT1c, pN1a      01/01/2015 Surgery    Rexcision of right breast margin: surgical margins negative for malignancy      01/15/2015 - 03/20/2015 Chemotherapy    Adjuvant chemotherapy with Taxotere and Cyclophosphamide X 4 cycles (Markeise Mathews)      04/10/2015 - 05/25/2015 Radiation Therapy    Adjuvant XRT completed  Pablo Ledger): Right breast 45 Gy over 25 fractions; Right breast boost 16 Gy at 2 Gy over 8 fractions. Total dose 61 Gy      05/31/2015 -  Anti-estrogen oral therapy    Anastrozole 1 mg daily stopped at 04/03/2016 due to tendinitis switched to Femara 04/21/2016 discontinued June 2017, starting exemestane 09/11/2016      08/03/2015 Survivorship    Survivorship visit completed and copy of survivorship care plan provided to patient.       CHIEF COMPLIANT: Follow-up on exemestane therapy  INTERVAL HISTORY: Shawna Marquez is a 77 year old with above-mentioned history of recurrent right breast cancer who is currently on exemestane and appears to be tolerating extremely well.  She has mild joint stiffness but it is not bothering her.  She came back from a 2-week cruise to [place] and had a wonderful time.  REVIEW OF SYSTEMS:   Constitutional: Denies fevers, chills or abnormal weight loss Eyes: Denies blurriness of vision Ears, nose, mouth, throat, and face: Denies mucositis or sore throat Respiratory: Denies cough, dyspnea or wheezes Cardiovascular: Denies palpitation, chest discomfort Gastrointestinal:  Denies nausea, heartburn or change in bowel habits Skin: Denies abnormal skin rashes Lymphatics: Denies new lymphadenopathy or easy bruising Neurological:Denies numbness, tingling or new weaknesses Behavioral/Psych: Mood is stable, no new changes  Extremities: No lower extremity edema Breast:  denies any pain or lumps or nodules in either breasts All other systems were reviewed with the patient and are negative.  I  have reviewed the past medical history, past surgical history, social history and family history with the patient and they are unchanged from previous note.  ALLERGIES:  has No Known Allergies.  MEDICATIONS:  Current Outpatient Medications  Medication Sig Dispense Refill  . aspirin EC 81 MG tablet Take 81 mg by mouth at bedtime. Reported on 02/25/2016    . Calcium Citrate-Vitamin  D (CALCIUM CITRATE + D3 PO) Take 1 tablet by mouth 2 (two) times daily. Calcium 500 mg, Vitamin D 800 units    . cetirizine (ZYRTEC) 10 MG tablet Take 10 mg by mouth daily.    . cholecalciferol (VITAMIN D) 1000 UNITS tablet Take 1,000 Units by mouth daily with lunch.     . clobetasol cream (TEMOVATE) 0.05 % APPLY TO AFFECTED AREA UP TO 2 TIMES DAILY AS NEEDED.NOT FACE,GROIN OR AXILLA  3  . Coenzyme Q10 (COQ10) 200 MG CAPS Take 200 mg by mouth every other day.    . exemestane (AROMASIN) 25 MG tablet TAKE 1 TABLET BY MOUTH EVERY DAY AFTER BREAKFAST 90 tablet 3  . fluticasone (FLONASE) 50 MCG/ACT nasal spray SHAKE WELL AND USE 1 SPRAY IN EACH NOSTRIL TWICE DAILY 48 g 3  . folic acid (FOLVITE) 354 MCG tablet Take 400 mcg by mouth daily with lunch.     . hydrocortisone cream 1 % Apply 1 application topically daily as needed for itching (eczema).    Marland Kitchen levothyroxine (SYNTHROID, LEVOTHROID) 125 MCG tablet TAKE 1 TABLET (125 MCG TOTAL) BY MOUTH DAILY BEFORE BREAKFAST. 90 tablet 3  . Multiple Vitamin (MULTIVITAMIN WITH MINERALS) TABS tablet Take 1 tablet by mouth daily with lunch.    . non-metallic deodorant Jethro Poling) MISC Apply 1 application topically daily as needed.    . Omega-3 Fatty Acids (FISH OIL) 1000 MG CAPS Take 1,000 mg by mouth daily with lunch.    . Turmeric 450 MG CAPS Take 450 mg by mouth daily with lunch.     . vitamin C (ASCORBIC ACID) 500 MG tablet Take 500 mg by mouth 2 (two) times daily.     No current facility-administered medications for this visit.     PHYSICAL EXAMINATION: ECOG PERFORMANCE STATUS: 1 - Symptomatic but completely ambulatory  Vitals:   06/18/18 0857  BP: 135/67  Pulse: 73  Resp: 18  Temp: 97.9 F (36.6 C)  SpO2: 98%   Filed Weights   06/18/18 0857  Weight: 174 lb 3.2 oz (79 kg)    GENERAL:alert, no distress and comfortable SKIN: skin color, texture, turgor are normal, no rashes or significant lesions EYES: normal, Conjunctiva are pink and non-injected,  sclera clear OROPHARYNX:no exudate, no erythema and lips, buccal mucosa, and tongue normal  NECK: supple, thyroid normal size, non-tender, without nodularity LYMPH:  no palpable lymphadenopathy in the cervical, axillary or inguinal LUNGS: clear to auscultation and percussion with normal breathing effort HEART: regular rate & rhythm and no murmurs and no lower extremity edema ABDOMEN:abdomen soft, non-tender and normal bowel sounds MUSCULOSKELETAL:no cyanosis of digits and no clubbing  NEURO: alert & oriented x 3 with fluent speech, no focal motor/sensory deficits EXTREMITIES: No lower extremity edema BREAST: No palpable masses or nodules in either right or left breasts. No palpable axillary supraclavicular or infraclavicular adenopathy no breast tenderness or nipple discharge. (exam performed in the presence of a chaperone)  LABORATORY DATA:  I have reviewed the data as listed CMP Latest Ref Rng & Units 01/27/2018 01/23/2017 05/31/2015  Glucose 70 - 99 mg/dL 86 82 98  BUN 6 - 23 mg/dL 15 15 15.4  Creatinine 0.40 - 1.20 mg/dL 0.76 0.69 0.6  Sodium 135 - 145 mEq/L 140 140 140  Potassium 3.5 - 5.1 mEq/L 4.1 3.9 4.1  Chloride 96 - 112 mEq/L 102 103 -  CO2 19 - 32 mEq/L '31 29 28  '$ Calcium 8.4 - 10.5 mg/dL 9.6 9.3 8.8  Total Protein 6.0 - 8.3 g/dL 6.6 6.7 6.2(L)  Total Bilirubin 0.2 - 1.2 mg/dL 0.6 0.6 0.31  Alkaline Phos 39 - 117 U/L 72 71 82  AST 0 - 37 U/L '16 17 22  '$ ALT 0 - 35 U/L '14 13 16    '$ Lab Results  Component Value Date   WBC 4.4 01/27/2018   HGB 14.0 01/27/2018   HCT 41.5 01/27/2018   MCV 94.6 01/27/2018   PLT 221.0 01/27/2018   NEUTROABS 2.7 01/27/2018    ASSESSMENT & PLAN:  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 breast 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, focal vascular involvement, posterior margin involvement, 1/2 sentinel node positive, T1 cN1 M0 stage II a, Mammaprint luminal type B, high risk, disease free survival with chemotherapy  and hormonal therapy 88% versus 76% with hormonal therapy alone;   Treatment summary: Adjuvant chemotherapy with Taxotere and Cytoxan every 3 weeks 4 cycles started 01/15/15 completed 03/19/15, followed by adjuvant radiation therapy 04/10/15- 05/25/2015  Started anastrozole 5 years 05/31/2015 stopped 04/03/2016 due to tendinitis involving bilateral wrists Started letrozole 04/14/2016; changed to exemestane October 2017  Exemestanetoxicities: Marked improvement in arthralgias and myalgias.  Neuropathy: Improving slowly with time. We'll continue to watch and observe. Bone density 04/09/2016: Osteopenia T score -1.9 continue with calcium and vitamin D  Breast Cancer Surveillance: 1. Breast exam 06/18/2018: No abnormalities 2. Mammograms: 11/26/2017 at [facility]: Benign She had another mammogram July 2019 at [facility] I will call for the results of this test.  Patient went on a 2-week in [place] cruise and had a great time. Return to clinic in 1 year for follow-up    No orders of the defined types were placed in this encounter.  The patient has a good understanding of the overall plan. she agrees with it. she will call with any problems that may develop before the next visit here.   Harriette Ohara, MD 06/18/18

## 2018-06-22 ENCOUNTER — Telehealth: Payer: Self-pay | Admitting: Hematology and Oncology

## 2018-06-22 NOTE — Telephone Encounter (Signed)
Mailed patient calendar of upcoming appts.  °

## 2018-10-15 ENCOUNTER — Ambulatory Visit (INDEPENDENT_AMBULATORY_CARE_PROVIDER_SITE_OTHER): Payer: Medicare Other | Admitting: Family Medicine

## 2018-10-15 ENCOUNTER — Encounter: Payer: Self-pay | Admitting: Family Medicine

## 2018-10-15 VITALS — BP 150/92 | HR 81 | Temp 98.0°F | Ht 60.0 in | Wt 175.4 lb

## 2018-10-15 DIAGNOSIS — H60332 Swimmer's ear, left ear: Secondary | ICD-10-CM

## 2018-10-15 DIAGNOSIS — J301 Allergic rhinitis due to pollen: Secondary | ICD-10-CM | POA: Diagnosis not present

## 2018-10-15 DIAGNOSIS — Z7689 Persons encountering health services in other specified circumstances: Secondary | ICD-10-CM | POA: Diagnosis not present

## 2018-10-15 MED ORDER — FLUTICASONE PROPIONATE 50 MCG/ACT NA SUSP: 2.0000 | Freq: Every day | NASAL | 3 refills | Status: DC

## 2018-10-15 MED ORDER — OFLOXACIN 0.3 % OT SOLN
5.0000 [drp] | Freq: Every day | OTIC | 0 refills | Status: AC
Start: 2018-10-15 — End: 2018-10-25

## 2018-10-15 NOTE — Progress Notes (Signed)
Shawna Marquez is a 77 y.o. female  Chief Complaint  Patient presents with  . Establish Care    pt here to establish care. pt also concerned left ear , she feels something may be in there    HPI: Shawna Marquez is a 77 y.o. female here to establish care with our office. Her previous PCP with [facility] recently retired.  She would like a flu shot.   Today she mentions a concern about her Lt ear. She feels as though there is fluid right inside her ear and some mild pain. She does water aerobics 4-5 days/wk and noticed this after her most recent class. No fever, chills. No drainage from ear. Slight decrease in hearing. No tinnitus  Specialists: oncology (breast cancer dx in 2015), pulmonary ([facility])  Last CPE, labs: 01/2018  Last PAP: n/a Last mammo: 06/2018 - h/o breast cancer, f/u woth oncology annualy Last Dexa: 04/2016 - pt does not want another bone scan Last colonoscopy: 2017 - was told to come back in 5 years for last/final one  Med refills needed today: flonase   Past Medical History:  Diagnosis Date  . ALLERGIC RHINITIS 10/12/2007  . BREAST CANCER, HX OF 10/12/2007   right breast diagnosed - 11/2014  . Cancer of upper-inner quadrant of female breast ([place]) 11/17/2014  . Complication of anesthesia    bp will drop,hard to wake up  . DIVERTICULITIS, HX OF 10/12/2007  . Family history of breast cancer   . HYPOTHYROIDISM, PRIMARY 10/12/2007  . OSTEOARTHRITIS 09/09/2007   back   . Radiation 04/10/15-05/25/15   Right Breast  . Sleep apnea    USES cpap NIGHTLY  . Wears glasses     Past Surgical History:  Procedure Laterality Date  . ABDOMINAL HYSTERECTOMY  1977  . COLON SURGERY  2006   part colectomy  . COLONOSCOPY  2007  . colovaginal fistula  2007   colon resection  . EYE SURGERY     both cataracts  . HERNIA REPAIR  2007   ventral  . MASTECTOMY  1997   lt mast-axillary node dissection  . PORTACATH PLACEMENT Left 01/01/2015   Procedure: INSERTION PORT-A-CATH;  Surgeon: Rolm Bookbinder, MD;  Location: [facility];  Service: General;  Laterality: Left;  . RADIOACTIVE SEED GUIDED PARTIAL MASTECTOMY WITH AXILLARY SENTINEL LYMPH NODE BIOPSY Right 11/28/2014   Procedure: RADIOACTIVE SEED GUIDED RIGHT LUMPECTOMY WITH RIGHT AXILLARY SENTINEL LYMPH NODE BIOPSY;  Surgeon: Rolm Bookbinder, MD;  Location: [facility];  Service: General;  Laterality: Right;  . RE-EXCISION OF BREAST CANCER,SUPERIOR MARGINS Right 01/01/2015   Procedure: RIGHT BREAST MARGIN EXCISION;  Surgeon: Rolm Bookbinder, MD;  Location: [facility];  Service: General;  Laterality: Right;  . TONSILLECTOMY    . WISDOM TOOTH EXTRACTION      Social History   Socioeconomic History  . Marital status: Married    Spouse name: Not on file  . Number of children: 3  . Years of education: Not on file  . Highest education level: Not on file  Occupational History  . Not on file  Social Needs  . Financial resource strain: Not on file  . Food insecurity:    Worry: Not on file    Inability: Not on file  . Transportation needs:    Medical: Not on file    Non-medical: Not on file  Tobacco Use  . Smoking status: Never Smoker  . Smokeless tobacco: Never Used  Substance and Sexual Activity  .  Alcohol use: Yes    Alcohol/week: 0.0 standard drinks    Comment: occassional beer  . Drug use: No  . Sexual activity: Not on file  Lifestyle  . Physical activity:    Days per week: Not on file    Minutes per session: Not on file  . Stress: Not on file  Relationships  . Social connections:    Talks on phone: Not on file    Gets together: Not on file    Attends religious service: Not on file    Active member of club or organization: Not on file    Attends meetings of clubs or organizations: Not on file    Relationship status: Not on file  . Intimate partner violence:    Fear of current or ex partner: Not on file    Emotionally abused: Not on file    Physically abused: Not on file    Forced sexual activity:  Not on file  Other Topics Concern  . Not on file  Social History Narrative  . Not on file    Family History  Problem Relation Age of Onset  . Breast cancer Paternal Grandmother 69  . Lung cancer Father 83       smoker  . Prostate cancer Cousin        paternal cousin   . Heart disease Neg Hx        family hx  . Cancer Neg Hx        lung ca  . COPD Neg Hx        family hx  . Colon cancer Neg Hx      Immunization History  Administered Date(s) Administered  . Influenza Split 10/10/2013  . Influenza Whole 09/07/2000  . Influenza, High Dose Seasonal PF 10/27/2014, 09/27/2015, 10/09/2016, 09/24/2017  . Pneumococcal Conjugate-13 02/24/2014  . Pneumococcal Polysaccharide-23 02/06/2004  . Td 03/18/1996  . Tdap 02/08/2013    Outpatient Encounter Medications as of 10/15/2018  Medication Sig  . aspirin EC 81 MG tablet Take 81 mg by mouth at bedtime. Reported on 02/25/2016  . Calcium Citrate-Vitamin D (CALCIUM CITRATE + D3 PO) Take 1 tablet by mouth 2 (two) times daily. Calcium 500 mg, Vitamin D 800 units  . cetirizine (ZYRTEC) 10 MG tablet Take 10 mg by mouth daily.  . cholecalciferol (VITAMIN D) 1000 UNITS tablet Take 1,000 Units by mouth daily with lunch.   . clobetasol cream (TEMOVATE) 0.05 % APPLY TO AFFECTED AREA UP TO 2 TIMES DAILY AS NEEDED.NOT FACE,GROIN OR AXILLA  . Coenzyme Q10 (COQ10) 200 MG CAPS Take 200 mg by mouth every other day.  . exemestane (AROMASIN) 25 MG tablet TAKE 1 TABLET BY MOUTH EVERY DAY AFTER BREAKFAST  . fluticasone (FLONASE) 50 MCG/ACT nasal spray SHAKE WELL AND USE 1 SPRAY IN EACH NOSTRIL TWICE DAILY  . folic acid (FOLVITE) 161 MCG tablet Take 400 mcg by mouth daily with lunch.   . hydrocortisone cream 1 % Apply 1 application topically daily as needed for itching (eczema).  Marland Kitchen levothyroxine (SYNTHROID, LEVOTHROID) 125 MCG tablet TAKE 1 TABLET (125 MCG TOTAL) BY MOUTH DAILY BEFORE BREAKFAST.  . Multiple Vitamin (MULTIVITAMIN WITH MINERALS) TABS tablet Take  1 tablet by mouth daily with lunch.  . non-metallic deodorant Jethro Poling) MISC Apply 1 application topically daily as needed.  . Omega-3 Fatty Acids (FISH OIL) 1000 MG CAPS Take 1,000 mg by mouth daily with lunch.  . Turmeric 450 MG CAPS Take 450 mg by mouth daily with lunch.   Marland Kitchen  vitamin C (ASCORBIC ACID) 500 MG tablet Take 500 mg by mouth 2 (two) times daily.   No facility-administered encounter medications on file as of 10/15/2018.      ROS: Gen: no fever, chills  Skin: no rash, itching ENT: Lt ear fullness/pain; no ear drainage, nasal congestion, rhinorrhea, sinus pressure, sore throat Eyes: no blurry vision, double vision Resp: no cough, wheeze,SOB CV: no CP, palpitations, LE edema,  GI: no heartburn, n/v/d/c, abd pain GU: no dysuria, urgency, frequency, hematuria  MSK: no joint pain, myalgias, back pain Neuro: no dizziness, headache, weakness, vertigo Psych: no depression, anxiety, insomnia   No Known Allergies  BP (!) 150/92 (BP Location: Right Arm, Patient Position: Sitting, Cuff Size: Normal)   Pulse 81   Ht 5' (1.524 m)   Wt 175 lb 6.4 oz (79.6 kg)   SpO2 95%   BMI 34.26 kg/m   Physical Exam  Constitutional: She is oriented to person, place, and time. She appears well-developed and well-nourished. No distress.  HENT:  Right Ear: Tympanic membrane, external ear and ear canal normal. No drainage or tenderness.  Left Ear: Tympanic membrane normal. There is tenderness.  Lt canal with erythema and mild swelling; no drainage   Cardiovascular: Regular rhythm and normal heart sounds.  Pulmonary/Chest: Effort normal and breath sounds normal. No respiratory distress.  Lymphadenopathy:    She has no cervical adenopathy.  Neurological: She is alert and oriented to person, place, and time.  Skin: Skin is warm and dry.  Psychiatric: She has a normal mood and affect. Her behavior is normal.     A/P:  1. Seasonal allergic rhinitis due to pollen - chronic issue,  stable Refill: - fluticasone (FLONASE) 50 MCG/ACT nasal spray; Place 2 sprays into both nostrils daily.  Dispense: 48 g; Refill: 3  2. Encounter to establish care with new doctor - due for annual exam, labs in 01/2019  3. Acute swimmer's ear of left side Rx: - ofloxacin (FLOXIN OTIC) 0.3 % OTIC solution; Place 5 drops into the left ear daily for 10 days.  Dispense: 2.5 mL; Refill: 0 - f/u if symptoms worsen or do not improve in 7-10 days Discussed plan and reviewed medications with patient, including risks, benefits, and potential side effects. Pt expressed understand. All questions answered.

## 2018-10-22 ENCOUNTER — Encounter: Payer: Self-pay | Admitting: Family Medicine

## 2018-11-17 ENCOUNTER — Ambulatory Visit (INDEPENDENT_AMBULATORY_CARE_PROVIDER_SITE_OTHER): Payer: Medicare Other | Admitting: Family Medicine

## 2018-11-17 ENCOUNTER — Encounter: Payer: Self-pay | Admitting: Family Medicine

## 2018-11-17 VITALS — BP 140/80 | HR 77 | Temp 98.2°F | Ht 60.0 in | Wt 175.0 lb

## 2018-11-17 DIAGNOSIS — J301 Allergic rhinitis due to pollen: Secondary | ICD-10-CM

## 2018-11-17 DIAGNOSIS — H6593 Unspecified nonsuppurative otitis media, bilateral: Secondary | ICD-10-CM | POA: Diagnosis not present

## 2018-11-17 NOTE — Progress Notes (Signed)
Shawna Marquez is a 77 y.o. female  Chief Complaint  Patient presents with  . Follow-up    f/u on left ear , no pain some itching , feels like water in ear    HPI: Shawna Marquez is a 77 y.o. female here for f/u on Lt ear. She was seen by me on 10/15/18 and given drops to treat otitis externa.  She feels like her ear "is full of water". She feels she is talking differently. No pain. No drainage. No fever, chills. Lt nasal congestion, runny nose. She has a h/o allergies and sinus congestion.  Past Medical History:  Diagnosis Date  . ALLERGIC RHINITIS 10/12/2007  . BREAST CANCER, HX OF 10/12/2007   right breast diagnosed - 11/2014  . Cancer of upper-inner quadrant of female breast ([place]) 11/17/2014  . Complication of anesthesia    bp will drop,hard to wake up  . DIVERTICULITIS, HX OF 10/12/2007  . Family history of breast cancer   . HYPOTHYROIDISM, PRIMARY 10/12/2007  . OSTEOARTHRITIS 09/09/2007   back   . Radiation 04/10/15-05/25/15   Right Breast  . Sleep apnea    USES cpap NIGHTLY  . Wears glasses     Past Surgical History:  Procedure Laterality Date  . ABDOMINAL HYSTERECTOMY  1977  . COLON SURGERY  2006   part colectomy  . COLONOSCOPY  2007  . colovaginal fistula  2007   colon resection  . EYE SURGERY     both cataracts  . HERNIA REPAIR  2007   ventral  . MASTECTOMY  1997   lt mast-axillary node dissection  . PORTACATH PLACEMENT Left 01/01/2015   Procedure: INSERTION PORT-A-CATH;  Surgeon: Rolm Bookbinder, MD;  Location: [facility];  Service: General;  Laterality: Left;  . RADIOACTIVE SEED GUIDED PARTIAL MASTECTOMY WITH AXILLARY SENTINEL LYMPH NODE BIOPSY Right 11/28/2014   Procedure: RADIOACTIVE SEED GUIDED RIGHT LUMPECTOMY WITH RIGHT AXILLARY SENTINEL LYMPH NODE BIOPSY;  Surgeon: Rolm Bookbinder, MD;  Location: [facility];  Service: General;  Laterality: Right;  . RE-EXCISION OF BREAST CANCER,SUPERIOR MARGINS Right 01/01/2015   Procedure: RIGHT BREAST MARGIN  EXCISION;  Surgeon: Rolm Bookbinder, MD;  Location: [facility];  Service: General;  Laterality: Right;  . TONSILLECTOMY    . WISDOM TOOTH EXTRACTION      Social History   Socioeconomic History  . Marital status: Married    Spouse name: Not on file  . Number of children: 3  . Years of education: Not on file  . Highest education level: Not on file  Occupational History  . Not on file  Social Needs  . Financial resource strain: Not on file  . Food insecurity:    Worry: Not on file    Inability: Not on file  . Transportation needs:    Medical: Not on file    Non-medical: Not on file  Tobacco Use  . Smoking status: Never Smoker  . Smokeless tobacco: Never Used  Substance and Sexual Activity  . Alcohol use: Yes    Alcohol/week: 0.0 standard drinks    Comment: occassional beer  . Drug use: No  . Sexual activity: Not on file  Lifestyle  . Physical activity:    Days per week: Not on file    Minutes per session: Not on file  . Stress: Not on file  Relationships  . Social connections:    Talks on phone: Not on file    Gets together: Not on file    Attends  religious service: Not on file    Active member of club or organization: Not on file    Attends meetings of clubs or organizations: Not on file    Relationship status: Not on file  . Intimate partner violence:    Fear of current or ex partner: Not on file    Emotionally abused: Not on file    Physically abused: Not on file    Forced sexual activity: Not on file  Other Topics Concern  . Not on file  Social History Narrative  . Not on file    Family History  Problem Relation Age of Onset  . Breast cancer Paternal Grandmother 73  . Lung cancer Father 53       smoker  . Prostate cancer Cousin        paternal cousin   . Heart disease Neg Hx        family hx  . Cancer Neg Hx        lung ca  . COPD Neg Hx        family hx  . Colon cancer Neg Hx      Immunization History  Administered Date(s) Administered  .  Influenza Split 10/10/2013  . Influenza Whole 09/07/2000  . Influenza, High Dose Seasonal PF 10/27/2014, 09/27/2015, 10/09/2016, 09/24/2017  . Pneumococcal Conjugate-13 02/24/2014  . Pneumococcal Polysaccharide-23 02/06/2004  . Td 03/18/1996  . Tdap 02/08/2013    Outpatient Encounter Medications as of 11/17/2018  Medication Sig  . aspirin EC 81 MG tablet Take 81 mg by mouth at bedtime. Reported on 02/25/2016  . Calcium Citrate-Vitamin D (CALCIUM CITRATE + D3 PO) Take 1 tablet by mouth 2 (two) times daily. Calcium 500 mg, Vitamin D 800 units  . cetirizine (ZYRTEC) 10 MG tablet Take 10 mg by mouth daily.  . cholecalciferol (VITAMIN D) 1000 UNITS tablet Take 1,000 Units by mouth daily with lunch.   . clobetasol cream (TEMOVATE) 0.05 % APPLY TO AFFECTED AREA UP TO 2 TIMES DAILY AS NEEDED.NOT FACE,GROIN OR AXILLA  . Coenzyme Q10 (COQ10) 200 MG CAPS Take 200 mg by mouth every other day.  . exemestane (AROMASIN) 25 MG tablet TAKE 1 TABLET BY MOUTH EVERY DAY AFTER BREAKFAST  . fluticasone (FLONASE) 50 MCG/ACT nasal spray Place 2 sprays into both nostrils daily.  . folic acid (FOLVITE) 962 MCG tablet Take 400 mcg by mouth daily with lunch.   . hydrocortisone cream 1 % Apply 1 application topically daily as needed for itching (eczema).  Marland Kitchen levothyroxine (SYNTHROID, LEVOTHROID) 125 MCG tablet TAKE 1 TABLET (125 MCG TOTAL) BY MOUTH DAILY BEFORE BREAKFAST.  . Multiple Vitamin (MULTIVITAMIN WITH MINERALS) TABS tablet Take 1 tablet by mouth daily with lunch.  . non-metallic deodorant Jethro Poling) MISC Apply 1 application topically daily as needed.  . Omega-3 Fatty Acids (FISH OIL) 1000 MG CAPS Take 1,000 mg by mouth daily with lunch.  . Turmeric 450 MG CAPS Take 450 mg by mouth daily with lunch.   . vitamin C (ASCORBIC ACID) 500 MG tablet Take 500 mg by mouth 2 (two) times daily.   No facility-administered encounter medications on file as of 11/17/2018.      ROS: Gen: no fever, chills  Skin: no rash,  itching ENT: see above in HPI Resp: no cough, wheeze,SOB CV: no CP, palpitations, LE edema,  GI: no heartburn, n/v/d/c, abd pain MSK: no joint pain, myalgias, back pain Neuro: no dizziness, headache, weakness, vertigo Psych: no depression, anxiety, insomnia   No Known  Allergies  BP 140/80 (BP Location: Right Arm, Patient Position: Sitting, Cuff Size: Normal)   Pulse 77   Temp 98.2 F (36.8 C) (Oral)   Ht 5' (1.524 m)   Wt 175 lb (79.4 kg)   SpO2 97%   BMI 34.18 kg/m   BP Readings from Last 3 Encounters:  11/17/18 140/80  10/15/18 (!) 150/92  06/18/18 135/67     Physical Exam  Constitutional: She appears well-developed and well-nourished. No distress.  HENT:  Head: Normocephalic and atraumatic.  Right Ear: External ear and ear canal normal. A middle ear effusion (serous) is present.  Left Ear: External ear normal. A middle ear effusion (serous) is present.  Nose: Mucosal edema present. Right sinus exhibits no maxillary sinus tenderness and no frontal sinus tenderness. Left sinus exhibits no maxillary sinus tenderness and no frontal sinus tenderness.  Mouth/Throat: Oropharynx is clear and moist and mucous membranes are normal.  Dry skin in B/L (Lt > Rt) canal      A/P:  1. Middle ear effusion, bilateral 2. Seasonal allergic rhinitis due to pollen - recommended flonase and claritin or zyrtec daily, nasal saline spray TID - pt can also try sudafed or benadryl as needed but should use this sparingly if at all - f/u PRN

## 2019-02-08 NOTE — Progress Notes (Signed)
Subjective:   Shawna Marquez is a 78 y.o. female who presents for Medicare Annual (Subsequent) preventive examination.  Serves a Nurse, learning disability at [employer].   Review of Systems: No ROS.  Medicare Wellness Visit. Additional risk factors are reflected in the social history. Cardiac Risk Factors include: advanced age (>60men, >59 women) Sleep patterns: Wears CPAP. Sleeps 8-9 hours Home Safety/Smoke Alarms: Feels safe in home. Smoke alarms in place.  Lives with husband in 1 story home. Walk in shower with grab rails.  Female:       Mammo- utd      Dexa scan-  utd         Objective:     Vitals: BP 118/78 (BP Location: Left Arm, Patient Position: Sitting, Cuff Size: Normal)   Pulse (!) 52   Ht 5' (1.524 m)   Wt 172 lb (78 kg)   SpO2 100%   BMI 33.59 kg/m   Body mass index is 33.59 kg/m.  Advanced Directives 02/09/2019 12/11/2016 08/30/2015 05/31/2015 04/19/2015 03/27/2015 03/19/2015  Does Patient Have a Medical Advance Directive? Yes No Yes Yes Yes - Yes  Type of Paramedic of [place];Living will - Healthcare Power of [place]  Does patient want to make changes to medical advance directive? No - Patient declined - - - - - -  Copy of [place] in Chart? No - copy requested - No - copy requested - - No - copy requested No - copy requested    Tobacco Social History   Tobacco Use  Smoking Status Never Smoker  Smokeless Tobacco Never Used     Counseling given: Not Answered   Clinical Intake:     Pain : No/denies pain                 Past Medical History:  Diagnosis Date  . ALLERGIC RHINITIS 10/12/2007  . BREAST CANCER, HX OF 10/12/2007   right breast diagnosed - 11/2014  . Cancer of upper-inner quadrant of female breast ([facility]) 11/17/2014  . Complication of anesthesia    bp will drop,hard to wake up  . DIVERTICULITIS, HX OF  10/12/2007  . Family history of breast cancer   . HYPOTHYROIDISM, PRIMARY 10/12/2007  . OSTEOARTHRITIS 09/09/2007   back   . Radiation 04/10/15-05/25/15   Right Breast  . Sleep apnea    USES cpap NIGHTLY  . Wears glasses    Past Surgical History:  Procedure Laterality Date  . ABDOMINAL HYSTERECTOMY  1977  . COLON SURGERY  2006   part colectomy  . COLONOSCOPY  2007  . colovaginal fistula  2007   colon resection  . EYE SURGERY     both cataracts  . HERNIA REPAIR  2007   ventral  . MASTECTOMY  1997   lt mast-axillary node dissection  . PORTACATH PLACEMENT Left 01/01/2015   Procedure: INSERTION PORT-A-CATH;  Surgeon: Rolm Bookbinder, MD;  Location: [facility];  Service: General;  Laterality: Left;  . RADIOACTIVE SEED GUIDED PARTIAL MASTECTOMY WITH AXILLARY SENTINEL LYMPH NODE BIOPSY Right 11/28/2014   Procedure: RADIOACTIVE SEED GUIDED RIGHT LUMPECTOMY WITH RIGHT AXILLARY SENTINEL LYMPH NODE BIOPSY;  Surgeon: Rolm Bookbinder, MD;  Location: [facility];  Service: General;  Laterality: Right;  . RE-EXCISION OF BREAST CANCER,SUPERIOR MARGINS Right 01/01/2015   Procedure: RIGHT BREAST MARGIN EXCISION;  Surgeon: Rolm Bookbinder, MD;  Location: [facility];  Service: General;  Laterality: Right;  . TONSILLECTOMY    . WISDOM TOOTH EXTRACTION     Family History  Problem Relation Age of Onset  . Breast cancer Paternal Grandmother 29  . Lung cancer Father 58       smoker  . Prostate cancer Cousin        paternal cousin   . Heart disease Neg Hx        family hx  . Cancer Neg Hx        lung ca  . COPD Neg Hx        family hx  . Colon cancer Neg Hx    Social History   Socioeconomic History  . Marital status: Married    Spouse name: Not on file  . Number of children: 3  . Years of education: Not on file  . Highest education level: Not on file  Occupational History  . Not on file  Social Needs  . Financial resource strain: Not on file  . Food insecurity:    Worry: Not on  file    Inability: Not on file  . Transportation needs:    Medical: Not on file    Non-medical: Not on file  Tobacco Use  . Smoking status: Never Smoker  . Smokeless tobacco: Never Used  Substance and Sexual Activity  . Alcohol use: Yes    Alcohol/week: 0.0 standard drinks    Comment: occassional beer  . Drug use: No  . Sexual activity: Yes  Lifestyle  . Physical activity:    Days per week: Not on file    Minutes per session: Not on file  . Stress: Not on file  Relationships  . Social connections:    Talks on phone: Not on file    Gets together: Not on file    Attends religious service: Not on file    Active member of club or organization: Not on file    Attends meetings of clubs or organizations: Not on file    Relationship status: Not on file  Other Topics Concern  . Not on file  Social History Narrative  . Not on file    Outpatient Encounter Medications as of 02/09/2019  Medication Sig  . aspirin EC 81 MG tablet Take 81 mg by mouth at bedtime. Reported on 02/25/2016  . Calcium Citrate-Vitamin D (CALCIUM CITRATE + D3 PO) Take 1 tablet by mouth 2 (two) times daily. Calcium 500 mg, Vitamin D 800 units  . cetirizine (ZYRTEC) 10 MG tablet Take 10 mg by mouth daily.  . cholecalciferol (VITAMIN D) 1000 UNITS tablet Take 1,000 Units by mouth daily with lunch.   . clobetasol cream (TEMOVATE) 0.05 % APPLY TO AFFECTED AREA UP TO 2 TIMES DAILY AS NEEDED.NOT FACE,GROIN OR AXILLA  . Coenzyme Q10 (COQ10) 200 MG CAPS Take 200 mg by mouth every other day.  . exemestane (AROMASIN) 25 MG tablet TAKE 1 TABLET BY MOUTH EVERY DAY AFTER BREAKFAST  . fluticasone (FLONASE) 50 MCG/ACT nasal spray Place 2 sprays into both nostrils daily.  . folic acid (FOLVITE) 341 MCG tablet Take 400 mcg by mouth daily with lunch.   . hydrocortisone cream 1 % Apply 1 application topically daily as needed for itching (eczema).  Marland Kitchen levothyroxine (SYNTHROID, LEVOTHROID) 125 MCG tablet TAKE 1 TABLET (125 MCG TOTAL) BY  MOUTH DAILY BEFORE BREAKFAST.  . Multiple Vitamin (MULTIVITAMIN WITH MINERALS) TABS tablet Take 1 tablet by mouth daily with lunch.  . non-metallic deodorant Jethro Poling) MISC Apply 1 application  topically daily as needed.  . Omega-3 Fatty Acids (FISH OIL) 1000 MG CAPS Take 1,000 mg by mouth daily with lunch.  . Turmeric 450 MG CAPS Take 450 mg by mouth daily with lunch.   . vitamin C (ASCORBIC ACID) 500 MG tablet Take 500 mg by mouth 2 (two) times daily.   No facility-administered encounter medications on file as of 02/09/2019.     Activities of Daily Living In your present state of health, do you have any difficulty performing the following activities: 02/09/2019  Hearing? N  Vision? N  Difficulty concentrating or making decisions? N  Walking or climbing stairs? N  Dressing or bathing? N  Doing errands, shopping? N  Preparing Food and eating ? N  Using the Toilet? N  In the past six months, have you accidently leaked urine? Y  Do you have problems with loss of bowel control? N  Managing your Medications? N  Managing your Finances? N  Housekeeping or managing your Housekeeping? N  Some recent data might be hidden    Patient Care Team: Ronnald Nian, DO as PCP - General (Family Medicine) Rolm Bookbinder, MD as Consulting Physician (General Surgery) Nicholas Lose, MD as Consulting Physician (Hematology and Oncology) Thea Silversmith, MD as Consulting Physician (Radiation Oncology) Holley Bouche, NP (Inactive) as Nurse Practitioner (Nurse Practitioner) Sylvan Cheese, NP as Nurse Practitioner (Nurse Practitioner)    Assessment:   This is a routine wellness examination for [place]. Physical assessment deferred to PCP.  Exercise Activities and Dietary recommendations Current Exercise Habits: Structured exercise class, Time (Minutes): 45, Frequency (Times/Week): 4, Weekly Exercise (Minutes/Week): 180, Intensity: Mild, Exercise limited by: None identified   Diet (meal  preparation, eat out, water intake, caffeinated beverages, dairy products, fruits and vegetables): well balanced, on average, 3 meals per day     Goals    . Maintain current healthy lifestyle.       Fall Risk Fall Risk  02/09/2019 01/27/2018 01/15/2016 12/25/2014 12/13/2014  Falls in the past year? 0 No No No No    Depression Screen PHQ 2/9 Scores 02/09/2019 01/27/2018 01/15/2016 02/24/2014  PHQ - 2 Score 0 0 0 0     Cognitive Function Ad8 score reviewed for issues:  Issues making decisions:no  Less interest in hobbies / activities:no  Repeats questions, stories (family complaining):no  Trouble using ordinary gadgets (microwave, computer, phone):no  Forgets the month or year: no  Mismanaging finances: no  Remembering appts:no  Daily problems with thinking and/or memory:no Ad8 score is=0        Immunization History  Administered Date(s) Administered  . Influenza Split 10/10/2013  . Influenza Whole 09/07/2000  . Influenza, High Dose Seasonal PF 10/27/2014, 09/27/2015, 10/09/2016, 09/24/2017  . Influenza-Unspecified 10/15/2018  . Pneumococcal Conjugate-13 02/24/2014  . Pneumococcal Polysaccharide-23 02/06/2004  . Td 03/18/1996  . Tdap 02/08/2013    Screening Tests Health Maintenance  Topic Date Due  . MAMMOGRAM  06/09/2019  . TETANUS/TDAP  02/09/2023  . INFLUENZA VACCINE  Completed  . DEXA SCAN  Completed  . PNA vac Low Risk Adult  Completed       Plan:    Please schedule your next medicare wellness visit with me in 1 yr.  Continue to eat heart healthy diet (full of fruits, vegetables, whole grains, lean protein, water--limit salt, fat, and sugar intake) and increase physical activity as tolerated.  Continue doing brain stimulating activities (puzzles, reading, adult coloring books, staying active) to keep memory sharp.   Bring a  copy of your living will and/or healthcare power of attorney to your next office visit.   I have personally reviewed and noted the  following in the patient's chart:   . Medical and social history . Use of alcohol, tobacco or illicit drugs  . Current medications and supplements . Functional ability and status . Nutritional status . Physical activity . Advanced directives . List of other physicians . Hospitalizations, surgeries, and ER visits in previous 12 months . Vitals . Screenings to include cognitive, depression, and falls . Referrals and appointments  In addition, I have reviewed and discussed with patient certain preventive protocols, quality metrics, and best practice recommendations. A written personalized care plan for preventive services as well as general preventive health recommendations were provided to patient.     Shela Nevin, [place]  02/09/2019

## 2019-02-09 ENCOUNTER — Ambulatory Visit (INDEPENDENT_AMBULATORY_CARE_PROVIDER_SITE_OTHER): Payer: Medicare Other | Admitting: Family Medicine

## 2019-02-09 ENCOUNTER — Encounter: Payer: Self-pay | Admitting: *Deleted

## 2019-02-09 ENCOUNTER — Ambulatory Visit (INDEPENDENT_AMBULATORY_CARE_PROVIDER_SITE_OTHER): Payer: Medicare Other | Admitting: *Deleted

## 2019-02-09 ENCOUNTER — Encounter: Payer: Self-pay | Admitting: Family Medicine

## 2019-02-09 VITALS — BP 118/78 | HR 52 | Ht 60.0 in | Wt 172.0 lb

## 2019-02-09 VITALS — BP 118/78 | HR 52 | Temp 97.7°F | Ht 60.0 in | Wt 172.8 lb

## 2019-02-09 DIAGNOSIS — M159 Polyosteoarthritis, unspecified: Secondary | ICD-10-CM

## 2019-02-09 DIAGNOSIS — E785 Hyperlipidemia, unspecified: Secondary | ICD-10-CM | POA: Diagnosis not present

## 2019-02-09 DIAGNOSIS — N644 Mastodynia: Secondary | ICD-10-CM | POA: Diagnosis not present

## 2019-02-09 DIAGNOSIS — Z853 Personal history of malignant neoplasm of breast: Secondary | ICD-10-CM | POA: Diagnosis not present

## 2019-02-09 DIAGNOSIS — E039 Hypothyroidism, unspecified: Secondary | ICD-10-CM | POA: Diagnosis not present

## 2019-02-09 DIAGNOSIS — M150 Primary generalized (osteo)arthritis: Secondary | ICD-10-CM | POA: Diagnosis not present

## 2019-02-09 DIAGNOSIS — E559 Vitamin D deficiency, unspecified: Secondary | ICD-10-CM

## 2019-02-09 DIAGNOSIS — M8949 Other hypertrophic osteoarthropathy, multiple sites: Secondary | ICD-10-CM

## 2019-02-09 DIAGNOSIS — Z0000 Encounter for general adult medical examination without abnormal findings: Secondary | ICD-10-CM | POA: Diagnosis not present

## 2019-02-09 LAB — TSH: TSH: 0.57 u[IU]/mL (ref 0.35–4.50)

## 2019-02-09 LAB — BASIC METABOLIC PANEL
BUN: 15 mg/dL (ref 6–23)
CO2: 23 meq/L (ref 19–32)
Calcium: 9.7 mg/dL (ref 8.4–10.5)
Chloride: 102 mEq/L (ref 96–112)
Creatinine, Ser: 0.7 mg/dL (ref 0.40–1.20)
GFR: 80.51 mL/min (ref >60.00)
GLUCOSE: 71 mg/dL (ref 70–99)
POTASSIUM: 4.5 meq/L (ref 3.5–5.1)
Sodium: 139 mEq/L (ref 135–145)

## 2019-02-09 NOTE — Progress Notes (Signed)
Medicare AWV note reviewed and agree with plan as stated Letta Median, D.O.

## 2019-02-09 NOTE — Patient Instructions (Signed)
Please schedule your next medicare wellness visit with me in 1 yr.  Continue to eat heart healthy diet (full of fruits, vegetables, whole grains, lean protein, water--limit salt, fat, and sugar intake) and increase physical activity as tolerated.  Continue doing brain stimulating activities (puzzles, reading, adult coloring books, staying active) to keep memory sharp.   Bring a copy of your living will and/or healthcare power of attorney to your next office visit.   Shawna Marquez , Thank you for taking time to come for your Medicare Wellness Visit. I appreciate your ongoing commitment to your health goals. Please review the following plan we discussed and let me know if I can assist you in the future.   These are the goals we discussed: Goals    . Maintain current healthy lifestyle.       This is a list of the screening recommended for you and due dates:  Health Maintenance  Topic Date Due  . Mammogram  06/09/2019  . Tetanus Vaccine  02/09/2023  . Flu Shot  Completed  . DEXA scan (bone density measurement)  Completed  . Pneumonia vaccines  Completed    Health Maintenance After Age 61 After age 64, you are at a higher risk for certain long-term diseases and infections as well as injuries from falls. Falls are a major cause of broken bones and head injuries in people who are older than age 75. Getting regular preventive care can help to keep you healthy and well. Preventive care includes getting regular testing and making lifestyle changes as recommended by your health care provider. Talk with your health care provider about:  Which screenings and tests you should have. A screening is a test that checks for a disease when you have no symptoms.  A diet and exercise plan that is right for you. What should I know about screenings and tests to prevent falls? Screening and testing are the best ways to find a health problem early. Early diagnosis and treatment give you the best chance of  managing medical conditions that are common after age 42. Certain conditions and lifestyle choices may make you more likely to have a fall. Your health care provider may recommend:  Regular vision checks. Poor vision and conditions such as cataracts can make you more likely to have a fall. If you wear glasses, make sure to get your prescription updated if your vision changes.  Medicine review. Work with your health care provider to regularly review all of the medicines you are taking, including over-the-counter medicines. Ask your health care provider about any side effects that may make you more likely to have a fall. Tell your health care provider if any medicines that you take make you feel dizzy or sleepy.  Osteoporosis screening. Osteoporosis is a condition that causes the bones to get weaker. This can make the bones weak and cause them to break more easily.  Blood pressure screening. Blood pressure changes and medicines to control blood pressure can make you feel dizzy.  Strength and balance checks. Your health care provider may recommend certain tests to check your strength and balance while standing, walking, or changing positions.  Foot health exam. Foot pain and numbness, as well as not wearing proper footwear, can make you more likely to have a fall.  Depression screening. You may be more likely to have a fall if you have a fear of falling, feel emotionally low, or feel unable to do activities that you used to do.  Alcohol use  screening. Using too much alcohol can affect your balance and may make you more likely to have a fall. What actions can I take to lower my risk of falls? General instructions  Talk with your health care provider about your risks for falling. Tell your health care provider if: ? You fall. Be sure to tell your health care provider about all falls, even ones that seem minor. ? You feel dizzy, sleepy, or off-balance.  Take over-the-counter and prescription  medicines only as told by your health care provider. These include any supplements.  Eat a healthy diet and maintain a healthy weight. A healthy diet includes low-fat dairy products, low-fat (lean) meats, and fiber from whole grains, beans, and lots of fruits and vegetables. Home safety  Remove any tripping hazards, such as rugs, cords, and clutter.  Install safety equipment such as grab bars in bathrooms and safety rails on stairs.  Keep rooms and walkways well-lit. Activity   Follow a regular exercise program to stay fit. This will help you maintain your balance. Ask your health care provider what types of exercise are appropriate for you.  If you need a cane or walker, use it as recommended by your health care provider.  Wear supportive shoes that have nonskid soles. Lifestyle  Do not drink alcohol if your health care provider tells you not to drink.  If you drink alcohol, limit how much you have: ? 0-1 drink a day for women. ? 0-2 drinks a day for men.  Be aware of how much alcohol is in your drink. In the [place] one drink equals one typical bottle of beer (12 oz), one-half glass of wine (5 oz), or one shot of hard liquor (1 oz).  Do not use any products that contain nicotine or tobacco, such as cigarettes and e-cigarettes. If you need help quitting, ask your health care provider. Summary  Having a healthy lifestyle and getting preventive care can help to protect your health and wellness after age 58.  Screening and testing are the best way to find a health problem early and help you avoid having a fall. Early diagnosis and treatment give you the best chance for managing medical conditions that are more common for people who are older than age 50.  Falls are a major cause of broken bones and head injuries in people who are older than age 2. Take precautions to prevent a fall at home.  Work with your health care provider to learn what changes you can make to improve your  health and wellness and to prevent falls. This information is not intended to replace advice given to you by your health care provider. Make sure you discuss any questions you have with your health care provider. Document Released: 10/07/2017 Document Revised: 10/07/2017 Document Reviewed: 10/07/2017 Elsevier Interactive Patient Education  2019 [employer].

## 2019-02-09 NOTE — Addendum Note (Signed)
Addended by: Diona Foley on: 02/09/2019 09:46 AM   Modules accepted: Orders

## 2019-02-09 NOTE — Patient Instructions (Signed)

## 2019-02-09 NOTE — Progress Notes (Signed)
Shawna Marquez is a 78 y.o. female  Chief Complaint  Patient presents with  . Annual Exam    CPE -- Fasting    HPI: Shawna Marquez is a 78 y.o. female here for annual exam and follow-up on chronic medical issues including hypothyroidism, CPAP on OSA, osteoarthritis, h/o breast cancer, hyperlipidemia, h/o Vit D deficiency. She is due for labs today.  She had a few "brief" episodes of Rt breast pain - sharp, lasting a few seconds. No associated symptoms. She has a h/o breast cancer and is due for annual f/u in 06/2019.  Last PAP: n/a Last mammo: 06/2018 - h/o breast cancer, f/u with oncology annualy Last Dexa: 04/2016 - pt does not want another bone scan Last colonoscopy: 2017 - was told to come back in 5 years (2022) for last/final one  Specialists: oncology (breast cancer dx in 2015), pulmonary Dr. Annamaria Boots (OSA)  Diet/Exercise: appetite is good, limited exercise No falls and no balance issues  Pt has upcoming eye appt in about 6 weeks and has dental appt on Monday for a crown to be done.   Med refills needed today? none   Past Medical History:  Diagnosis Date  . ALLERGIC RHINITIS 10/12/2007  . BREAST CANCER, HX OF 10/12/2007   right breast diagnosed - 11/2014  . Cancer of upper-inner quadrant of female breast ([place]) 11/17/2014  . Complication of anesthesia    bp will drop,hard to wake up  . DIVERTICULITIS, HX OF 10/12/2007  . Family history of breast cancer   . HYPOTHYROIDISM, PRIMARY 10/12/2007  . OSTEOARTHRITIS 09/09/2007   back   . Radiation 04/10/15-05/25/15   Right Breast  . Sleep apnea    USES cpap NIGHTLY  . Wears glasses     Past Surgical History:  Procedure Laterality Date  . ABDOMINAL HYSTERECTOMY  1977  . COLON SURGERY  2006   part colectomy  . COLONOSCOPY  2007  . colovaginal fistula  2007   colon resection  . EYE SURGERY     both cataracts  . HERNIA REPAIR  2007   ventral  . MASTECTOMY  1997   lt mast-axillary node dissection  . PORTACATH PLACEMENT Left  01/01/2015   Procedure: INSERTION PORT-A-CATH;  Surgeon: Rolm Bookbinder, MD;  Location: [facility];  Service: General;  Laterality: Left;  . RADIOACTIVE SEED GUIDED PARTIAL MASTECTOMY WITH AXILLARY SENTINEL LYMPH NODE BIOPSY Right 11/28/2014   Procedure: RADIOACTIVE SEED GUIDED RIGHT LUMPECTOMY WITH RIGHT AXILLARY SENTINEL LYMPH NODE BIOPSY;  Surgeon: Rolm Bookbinder, MD;  Location: [facility];  Service: General;  Laterality: Right;  . RE-EXCISION OF BREAST CANCER,SUPERIOR MARGINS Right 01/01/2015   Procedure: RIGHT BREAST MARGIN EXCISION;  Surgeon: Rolm Bookbinder, MD;  Location: [facility];  Service: General;  Laterality: Right;  . TONSILLECTOMY    . WISDOM TOOTH EXTRACTION      Social History   Socioeconomic History  . Marital status: Married    Spouse name: Not on file  . Number of children: 3  . Years of education: Not on file  . Highest education level: Not on file  Occupational History  . Not on file  Social Needs  . Financial resource strain: Not on file  . Food insecurity:    Worry: Not on file    Inability: Not on file  . Transportation needs:    Medical: Not on file    Non-medical: Not on file  Tobacco Use  . Smoking status: Never Smoker  . Smokeless tobacco:  Never Used  Substance and Sexual Activity  . Alcohol use: Yes    Alcohol/week: 0.0 standard drinks    Comment: occassional beer  . Drug use: No  . Sexual activity: Not on file  Lifestyle  . Physical activity:    Days per week: Not on file    Minutes per session: Not on file  . Stress: Not on file  Relationships  . Social connections:    Talks on phone: Not on file    Gets together: Not on file    Attends religious service: Not on file    Active member of club or organization: Not on file    Attends meetings of clubs or organizations: Not on file    Relationship status: Not on file  . Intimate partner violence:    Fear of current or ex partner: Not on file    Emotionally abused: Not on file     Physically abused: Not on file    Forced sexual activity: Not on file  Other Topics Concern  . Not on file  Social History Narrative  . Not on file    Family History  Problem Relation Age of Onset  . Breast cancer Paternal Grandmother 35  . Lung cancer Father 28       smoker  . Prostate cancer Cousin        paternal cousin   . Heart disease Neg Hx        family hx  . Cancer Neg Hx        lung ca  . COPD Neg Hx        family hx  . Colon cancer Neg Hx      Immunization History  Administered Date(s) Administered  . Influenza Split 10/10/2013  . Influenza Whole 09/07/2000  . Influenza, High Dose Seasonal PF 10/27/2014, 09/27/2015, 10/09/2016, 09/24/2017  . Pneumococcal Conjugate-13 02/24/2014  . Pneumococcal Polysaccharide-23 02/06/2004  . Td 03/18/1996  . Tdap 02/08/2013    Outpatient Encounter Medications as of 02/09/2019  Medication Sig  . aspirin EC 81 MG tablet Take 81 mg by mouth at bedtime. Reported on 02/25/2016  . Calcium Citrate-Vitamin D (CALCIUM CITRATE + D3 PO) Take 1 tablet by mouth 2 (two) times daily. Calcium 500 mg, Vitamin D 800 units  . cetirizine (ZYRTEC) 10 MG tablet Take 10 mg by mouth daily.  . cholecalciferol (VITAMIN D) 1000 UNITS tablet Take 1,000 Units by mouth daily with lunch.   . clobetasol cream (TEMOVATE) 0.05 % APPLY TO AFFECTED AREA UP TO 2 TIMES DAILY AS NEEDED.NOT FACE,GROIN OR AXILLA  . Coenzyme Q10 (COQ10) 200 MG CAPS Take 200 mg by mouth every other day.  . exemestane (AROMASIN) 25 MG tablet TAKE 1 TABLET BY MOUTH EVERY DAY AFTER BREAKFAST  . fluticasone (FLONASE) 50 MCG/ACT nasal spray Place 2 sprays into both nostrils daily.  . folic acid (FOLVITE) 638 MCG tablet Take 400 mcg by mouth daily with lunch.   . hydrocortisone cream 1 % Apply 1 application topically daily as needed for itching (eczema).  Marland Kitchen levothyroxine (SYNTHROID, LEVOTHROID) 125 MCG tablet TAKE 1 TABLET (125 MCG TOTAL) BY MOUTH DAILY BEFORE BREAKFAST.  . Multiple  Vitamin (MULTIVITAMIN WITH MINERALS) TABS tablet Take 1 tablet by mouth daily with lunch.  . non-metallic deodorant Jethro Poling) MISC Apply 1 application topically daily as needed.  . Omega-3 Fatty Acids (FISH OIL) 1000 MG CAPS Take 1,000 mg by mouth daily with lunch.  . Turmeric 450 MG CAPS Take 450 mg  by mouth daily with lunch.   . vitamin C (ASCORBIC ACID) 500 MG tablet Take 500 mg by mouth 2 (two) times daily.   No facility-administered encounter medications on file as of 02/09/2019.      ROS: Gen: no fever, chills  Skin: no rash, itching ENT: no ear pain, ear drainage, nasal congestion, rhinorrhea, sinus pressure, sore throat Eyes: no blurry vision, double vision Resp: no cough, wheeze,SOB Breast: Rt breast tenderness, no nipple discharge, no breast masses CV: no CP, palpitations, LE edema,  GI: no heartburn, n/v/d/c, abd pain GU: no dysuria, urgency, frequency, hematuria; no vaginal itching, odor, discharge MSK: no joint pain, myalgias, back pain Neuro: no dizziness, headache, weakness, vertigo Psych: no depression, anxiety, insomnia   No Known Allergies  BP 118/78   Pulse (!) 52   Temp 97.7 F (36.5 C) (Oral)   Ht 5' (1.524 m)   Wt 172 lb 12.8 oz (78.4 kg)   SpO2 100%   BMI 33.75 kg/m   BP Readings from Last 3 Encounters:  02/09/19 118/78  11/17/18 140/80  10/15/18 (!) 150/92   Pulse Readings from Last 3 Encounters:  02/09/19 (!) 52  11/17/18 77  10/15/18 81    Physical Exam  Constitutional: She is oriented to person, place, and time. She appears well-developed and well-nourished. No distress.  HENT:  Head: Normocephalic and atraumatic.  Right Ear: Tympanic membrane and ear canal normal.  Left Ear: Tympanic membrane and ear canal normal.  Nose: Nose normal.  Mouth/Throat: Oropharynx is clear and moist and mucous membranes are normal.  Neck: Neck supple. No thyromegaly present.  Cardiovascular: Normal rate, regular rhythm and intact distal pulses.    Pulmonary/Chest: Effort normal and breath sounds normal. She has no wheezes. She has no rhonchi.  + TTP with deep palpation just inferior to nipple on Rt breast; no palpable abnormalities   Abdominal: Soft. Bowel sounds are normal. She exhibits no distension and no mass. There is no abdominal tenderness.  Musculoskeletal: Normal range of motion.        General: No edema.  Lymphadenopathy:    She has no cervical adenopathy.  Neurological: She is alert and oriented to person, place, and time.  Skin: Skin is warm and dry.  Psychiatric: She has a normal mood and affect. Her behavior is normal.     A/P:  1. Hypothyroidism, unspecified type - cont levothyroxine at current dose at this time - T4, free - TSH  2. Primary osteoarthritis involving multiple joints - stable - no actve joint pains  3. Dyslipidemia - ALT - AST - Lipid panel - discussed importance of regular CV exercise, healthy, low fat diet  4. Vitamin D deficiency - VITAMIN D 25 Hydroxy (Vit-D Deficiency, Fractures)  5. Breast pain, right - TTP with deep palpation just inferior to nipple; no palpable mass or abnormality - pt has a h/o breast cancer and is due for f/u with oncology in 06/2019 - monitor at this time and if symptoms increase/worsen, pt will f/u sooner with oncology

## 2019-02-14 ENCOUNTER — Other Ambulatory Visit (INDEPENDENT_AMBULATORY_CARE_PROVIDER_SITE_OTHER): Payer: Medicare Other

## 2019-02-14 DIAGNOSIS — E559 Vitamin D deficiency, unspecified: Secondary | ICD-10-CM

## 2019-02-14 DIAGNOSIS — M159 Polyosteoarthritis, unspecified: Secondary | ICD-10-CM

## 2019-02-14 DIAGNOSIS — M150 Primary generalized (osteo)arthritis: Secondary | ICD-10-CM | POA: Diagnosis not present

## 2019-02-14 DIAGNOSIS — E039 Hypothyroidism, unspecified: Secondary | ICD-10-CM | POA: Diagnosis not present

## 2019-02-14 DIAGNOSIS — E785 Hyperlipidemia, unspecified: Secondary | ICD-10-CM

## 2019-02-14 LAB — ALT: ALT: 15 U/L (ref 0–35)

## 2019-02-14 LAB — LIPID PANEL
Cholesterol: 154 mg/dL (ref 0–200)
HDL: 54.8 mg/dL (ref >39.00)
LDL Cholesterol: 83 mg/dL (ref 0–99)
NonHDL: 98.73
Total CHOL/HDL Ratio: 3
Triglycerides: 79 mg/dL (ref 0.0–149.0)
VLDL: 15.8 mg/dL (ref 0.0–40.0)

## 2019-02-14 LAB — T4, FREE: FREE T4: 0.96 ng/dL (ref 0.60–1.60)

## 2019-02-14 LAB — AST: AST: 17 U/L (ref 0–37)

## 2019-02-14 LAB — VITAMIN D 25 HYDROXY (VIT D DEFICIENCY, FRACTURES): VITD: 29.32 ng/mL — ABNORMAL LOW (ref 30.00–100.00)

## 2019-02-18 ENCOUNTER — Telehealth: Payer: Self-pay | Admitting: Family Medicine

## 2019-02-18 NOTE — Telephone Encounter (Signed)
Pt called to get results.  Copied from [place] [id number]. Topic: Quick Communication - Lab Results (Clinic Use ONLY) >> Feb 15, 2019 10:39 AM Prewette, Ovidio Hanger, LPN wrote: Called patient to inform them of 02/14/2019 lab results. When patient returns call, triage nurse may disclose results.

## 2019-02-18 NOTE — Telephone Encounter (Signed)
Pt given lab results and documented in result note.  

## 2019-02-21 ENCOUNTER — Other Ambulatory Visit: Payer: Self-pay | Admitting: Internal Medicine

## 2019-03-25 ENCOUNTER — Other Ambulatory Visit: Payer: Self-pay | Admitting: Family Medicine

## 2019-03-25 NOTE — Telephone Encounter (Signed)
Another request sent to [facility]

## 2019-03-29 ENCOUNTER — Telehealth: Payer: Self-pay | Admitting: Family Medicine

## 2019-03-29 DIAGNOSIS — J301 Allergic rhinitis due to pollen: Secondary | ICD-10-CM

## 2019-03-29 NOTE — Telephone Encounter (Signed)
Pt call request refill for flonase (she has 1 refill left) and synthroid (she has 1 pill left) send to [facility]. Pt stated she call about 10 days ago but didn't hear anything from our office. Dr. Loletha Grayer please advise, ok to send in both med? Please call the pt once this is done.

## 2019-03-30 MED ORDER — LEVOTHYROXINE SODIUM 125 MCG PO TABS: 125.0000 ug | ORAL_TABLET | Freq: Every day | ORAL | 3 refills | Status: DC

## 2019-03-30 MED ORDER — FLUTICASONE PROPIONATE 50 MCG/ACT NA SUSP: 2.0000 | Freq: Every day | NASAL | 3 refills | Status: DC

## 2019-03-30 NOTE — Telephone Encounter (Signed)
Pt aware of med refills sent to pharmacy on file.

## 2019-03-30 NOTE — Telephone Encounter (Signed)
Both meds refilled as requested. Please call pt to make her aware

## 2019-05-16 ENCOUNTER — Ambulatory Visit: Payer: Medicare Other | Admitting: Internal Medicine

## 2019-06-16 DIAGNOSIS — Z853 Personal history of malignant neoplasm of breast: Secondary | ICD-10-CM | POA: Diagnosis not present

## 2019-06-16 LAB — HM MAMMOGRAPHY

## 2019-06-16 NOTE — Assessment & Plan Note (Signed)
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, focal vascular involvement, posterior margin involvement, 1/2 sentinel node positive, T1 cN1 M0 stage II a, Mammaprint luminal type B, high risk, disease free survival with chemotherapy and hormonal therapy 88% versus 76% with hormonal therapy alone;   Treatment summary: Adjuvant chemotherapy with Taxotere and Cytoxan every 3 weeks 4 cycles started 01/15/15 completed 03/19/15, followed by adjuvant radiation therapy 04/10/15- 05/25/2015  Started anastrozole 5 years 05/31/2015 stopped 04/03/2016 due to tendinitis involving bilateral wrists Started letrozole 04/14/2016; changed to exemestane October 2017  Exemestanetoxicities: Marked improvement in arthralgias and myalgias.  Neuropathy: Improving slowly with time. We'll continue to watch and observe. Bone density 04/09/2016: Osteopenia T score -1.9 continue with calcium and vitamin D  Breast Cancer Surveillance: 1. Breast exam 06/18/2018: No abnormalities 2. Mammograms:  06/08/2018 at [facility]   Patient went on a 2-week in [place] cruise and had a great time. Return to clinic in1 year for follow-up

## 2019-06-20 DIAGNOSIS — H04213 Epiphora due to excess lacrimation, bilateral lacrimal glands: Secondary | ICD-10-CM | POA: Diagnosis not present

## 2019-06-20 DIAGNOSIS — H52203 Unspecified astigmatism, bilateral: Secondary | ICD-10-CM | POA: Diagnosis not present

## 2019-06-20 DIAGNOSIS — H26493 Other secondary cataract, bilateral: Secondary | ICD-10-CM | POA: Diagnosis not present

## 2019-06-20 DIAGNOSIS — H0100A Unspecified blepharitis right eye, upper and lower eyelids: Secondary | ICD-10-CM | POA: Diagnosis not present

## 2019-06-23 NOTE — Progress Notes (Signed)
Patient Care Team: Ronnald Nian, DO as PCP - General (Family Medicine) Rolm Bookbinder, MD as Consulting Physician (General Surgery) Nicholas Lose, MD as Consulting Physician (Hematology and Oncology) Thea Silversmith, MD as Consulting Physician (Radiation Oncology) Holley Bouche, NP (Inactive) as Nurse Practitioner (Nurse Practitioner) Sylvan Cheese, NP as Nurse Practitioner (Nurse Practitioner)  DIAGNOSIS:    ICD-10-CM   1. 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 breast ([place])  C50.211     SUMMARY OF ONCOLOGIC HISTORY: Oncology History  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 breast ([place])  06/07/1996 Miscellaneous   History of Stage IIB left breast cancer S/P mastectomy and chemotherapy (Adriamycin and Cytoxan) on CALGB 9397.   11/10/2014 Mammogram   Right breast: 9 mm mass    11/13/2014 Breast US   Right breast: 9 mm mass at 1:00 position with indistinct margin, middle depth, hypoechoic.   11/15/2014 Initial Biopsy   Right breast needle biopsy: Invasive ductal carcinoma, grade 2, with DCIS with lymphovascular invasion ER+ (100%), PR+ (100%), HER-2/neu negative (ratio 1.87), Ki-67 23%   11/15/2014 Clinical Stage   Stage IA; T1b N0   11/28/2014 Definitive Surgery   Right breast lumpectomy with SLNB Donne Hazel): Invasive ductal carcinoma, grade 3, 1.7 cm, with DCIS, focal vascular involvement, posterior margin involvement, 1/2 sentinel nodes positive for malignancy, repeat HER2/neu negative (ratio 1.14)   11/28/2014 Procedure   Mammaprint: High Risk Luminal Type B.    11/28/2014 Pathologic Stage   Stage IIA: pT1c, pN1a   01/01/2015 Surgery   Rexcision of right breast margin: surgical margins negative for malignancy   01/15/2015 - 03/20/2015 Chemotherapy   Adjuvant chemotherapy with Taxotere and Cyclophosphamide X 4 cycles (Gil Ingwersen)   04/10/2015 - 05/25/2015 Radiation Therapy   Adjuvant XRT completed Pablo Ledger): Right breast  45 Gy over 25 fractions; Right breast boost 16 Gy at 2 Gy over 8 fractions. Total dose 61 Gy   05/31/2015 -  Anti-estrogen oral therapy   Anastrozole 1 mg daily stopped at 04/03/2016 due to tendinitis switched to Femara 04/21/2016 discontinued June 2017, starting exemestane 09/11/2016   08/03/2015 Survivorship   Survivorship visit completed and copy of survivorship care plan provided to patient.     CHIEF COMPLIANT: Follow-up of recurrent right breast cancer on exemestane therapy  INTERVAL HISTORY: Shawna Marquez is a 78 y.o. with above-mentioned history of recurrent right breast cancer who is currently on anti-estrogen therapy with exemestane. I last saw her a year ago. She presents to the clinic today for annual follow-up.  She had a mammogram last week and it was at [facility].  Will get the reports. Denies any lumps or nodules in the breast.  REVIEW OF SYSTEMS:   Constitutional: Denies fevers, chills or abnormal weight loss Eyes: Denies blurriness of vision Ears, nose, mouth, throat, and face: Denies mucositis or sore throat Respiratory: Denies cough, dyspnea or wheezes Cardiovascular: Denies palpitation, chest discomfort Gastrointestinal: Denies nausea, heartburn or change in bowel habits Skin: Denies abnormal skin rashes Lymphatics: Denies new lymphadenopathy or easy bruising Neurological: Denies numbness, tingling or new weaknesses Behavioral/Psych: Mood is stable, no new changes  Extremities: No lower extremity edema Breast: denies any pain or lumps or nodules in either breasts All other systems were reviewed with the patient and are negative.  I have reviewed the past medical history, past surgical history, social history and family history with the patient and they are unchanged from previous note.  ALLERGIES:  has No Known Allergies.  MEDICATIONS:  Current Outpatient Medications  Medication Sig Dispense Refill  . aspirin EC 81 MG tablet Take 81 mg by mouth at bedtime. Reported  on 02/25/2016    . Calcium Citrate-Vitamin D (CALCIUM CITRATE + D3 PO) Take 1 tablet by mouth 2 (two) times daily. Calcium 500 mg, Vitamin D 800 units    . cetirizine (ZYRTEC) 10 MG tablet Take 10 mg by mouth daily.    . cholecalciferol (VITAMIN D) 1000 UNITS tablet Take 1,000 Units by mouth daily with lunch.     . clobetasol cream (TEMOVATE) 0.05 % APPLY TO AFFECTED AREA UP TO 2 TIMES DAILY AS NEEDED.NOT FACE,GROIN OR AXILLA  3  . Coenzyme Q10 (COQ10) 200 MG CAPS Take 200 mg by mouth every other day.    . exemestane (AROMASIN) 25 MG tablet TAKE 1 TABLET BY MOUTH EVERY DAY AFTER BREAKFAST 90 tablet 3  . fluticasone (FLONASE) 50 MCG/ACT nasal spray Place 2 sprays into both nostrils daily. 48 g 3  . folic acid (FOLVITE) 509 MCG tablet Take 400 mcg by mouth daily with lunch.     . hydrocortisone cream 1 % Apply 1 application topically daily as needed for itching (eczema).    Marland Kitchen levothyroxine (SYNTHROID) 125 MCG tablet Take 1 tablet (125 mcg total) by mouth daily before breakfast. 90 tablet 3  . Multiple Vitamin (MULTIVITAMIN WITH MINERALS) TABS tablet Take 1 tablet by mouth daily with lunch.    . non-metallic deodorant Jethro Poling) MISC Apply 1 application topically daily as needed.    . Omega-3 Fatty Acids (FISH OIL) 1000 MG CAPS Take 1,000 mg by mouth daily with lunch.    . Turmeric 450 MG CAPS Take 450 mg by mouth daily with lunch.     . vitamin C (ASCORBIC ACID) 500 MG tablet Take 500 mg by mouth 2 (two) times daily.     No current facility-administered medications for this visit.     PHYSICAL EXAMINATION: ECOG PERFORMANCE STATUS: 0 - Asymptomatic  Vitals:   06/24/19 0910  BP: (!) 147/78  Pulse: 78  Resp: 16  Temp: 98.5 F (36.9 C)  SpO2: 100%   Filed Weights   06/24/19 0910  Weight: 176 lb 4.8 oz (80 kg)    GENERAL: alert, no distress and comfortable SKIN: skin color, texture, turgor are normal, no rashes or significant lesions EYES: normal, Conjunctiva are pink and non-injected,  sclera clear OROPHARYNX: no exudate, no erythema and lips, buccal mucosa, and tongue normal  NECK: supple, thyroid normal size, non-tender, without nodularity LYMPH: no palpable lymphadenopathy in the cervical, axillary or inguinal LUNGS: clear to auscultation and percussion with normal breathing effort HEART: regular rate & rhythm and no murmurs and no lower extremity edema ABDOMEN: abdomen soft, non-tender and normal bowel sounds MUSCULOSKELETAL: no cyanosis of digits and no clubbing  NEURO: alert & oriented x 3 with fluent speech, no focal motor/sensory deficits EXTREMITIES: No lower extremity edema BREAST: No palpable masses or nodules in either right or left breasts. No palpable axillary supraclavicular or infraclavicular adenopathy no breast tenderness or nipple discharge. (exam performed in the presence of a chaperone)  LABORATORY DATA:  I have reviewed the data as listed CMP Latest Ref Rng & Units 02/14/2019 02/09/2019 01/27/2018  Glucose 70 - 99 mg/dL - 71 86  BUN 6 - 23 mg/dL - 15 15  Creatinine 0.40 - 1.20 mg/dL - 0.70 0.76  Sodium 135 - 145 mEq/L - 139 140  Potassium 3.5 - 5.1 mEq/L - 4.5 4.1  Chloride 96 - 112 mEq/L - 102 102  CO2 19 - 32 mEq/L - 23 31  Calcium 8.4 - 10.5 mg/dL - 9.7 9.6  Total Protein 6.0 - 8.3 g/dL - - 6.6  Total Bilirubin 0.2 - 1.2 mg/dL - - 0.6  Alkaline Phos 39 - 117 U/L - - 72  AST 0 - 37 U/L 17 - 16  ALT 0 - 35 U/L 15 - 14    Lab Results  Component Value Date   WBC 4.4 01/27/2018   HGB 14.0 01/27/2018   HCT 41.5 01/27/2018   MCV 94.6 01/27/2018   PLT 221.0 01/27/2018   NEUTROABS 2.7 01/27/2018    ASSESSMENT & PLAN:  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 breast 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, focal vascular involvement, posterior margin involvement, 1/2 sentinel node positive, T1 cN1 M0 stage II a, Mammaprint luminal type B, high risk, disease free survival with chemotherapy and hormonal therapy 88%  versus 76% with hormonal therapy alone;   Treatment summary: Adjuvant chemotherapy with Taxotere and Cytoxan every 3 weeks 4 cycles started 01/15/15 completed 03/19/15, followed by adjuvant radiation therapy 04/10/15- 05/25/2015  Started anastrozole 5 years 05/31/2015 stopped 04/03/2016 due to tendinitis involving bilateral wrists Started letrozole 04/14/2016; changed to exemestane October 2017  Exemestanetoxicities: Marked improvement in arthralgias and myalgias. Plan is to treat her for 7 years total.  Neuropathy: Improving slowly with time. We'll continue to watch and observe. Bone density 04/09/2016: Osteopenia T score -1.9 continue with calcium and vitamin D  Breast Cancer Surveillance: 1. Breast exam 06/18/2018: No abnormalities 2. Mammograms:  06/08/2018 at [facility]   Return to clinic in1 year for follow-up    No orders of the defined types were placed in this encounter.  The patient has a good understanding of the overall plan. she agrees with it. she will call with any problems that may develop before the next visit here.  Nicholas Lose, MD 06/24/2019  Julious Oka Dorshimer am acting as scribe for Dr. Nicholas Lose.  I have reviewed the above documentation for accuracy and completeness, and I agree with the above.

## 2019-06-24 ENCOUNTER — Inpatient Hospital Stay: Payer: Medicare Other | Attending: Hematology and Oncology | Admitting: Hematology and Oncology

## 2019-06-24 ENCOUNTER — Other Ambulatory Visit: Payer: Self-pay

## 2019-06-24 DIAGNOSIS — Z923 Personal history of irradiation: Secondary | ICD-10-CM | POA: Insufficient documentation

## 2019-06-24 DIAGNOSIS — Z79811 Long term (current) use of aromatase inhibitors: Secondary | ICD-10-CM | POA: Diagnosis not present

## 2019-06-24 DIAGNOSIS — Z7982 Long term (current) use of aspirin: Secondary | ICD-10-CM | POA: Diagnosis not present

## 2019-06-24 DIAGNOSIS — Z9221 Personal history of antineoplastic chemotherapy: Secondary | ICD-10-CM | POA: Diagnosis not present

## 2019-06-24 DIAGNOSIS — Z79899 Other long term (current) drug therapy: Secondary | ICD-10-CM | POA: Insufficient documentation

## 2019-06-24 DIAGNOSIS — C50211 Malignant neoplasm of upper-inner quadrant of right female breast: Secondary | ICD-10-CM | POA: Diagnosis not present

## 2019-06-24 DIAGNOSIS — M8580 Other specified disorders of bone density and structure, unspecified site: Secondary | ICD-10-CM

## 2019-06-24 DIAGNOSIS — Z9011 Acquired absence of right breast and nipple: Secondary | ICD-10-CM | POA: Insufficient documentation

## 2019-06-24 DIAGNOSIS — Z170 Estrogen receptor positive status [ER+]: Secondary | ICD-10-CM | POA: Diagnosis not present

## 2019-06-24 MED ORDER — EXEMESTANE 25 MG PO TABS: ORAL_TABLET | ORAL | 3 refills | Status: DC

## 2019-06-27 ENCOUNTER — Telehealth: Payer: Self-pay | Admitting: Hematology and Oncology

## 2019-06-27 NOTE — Telephone Encounter (Signed)
I could not reach regarding schedule  °

## 2019-07-05 ENCOUNTER — Encounter: Payer: Self-pay | Admitting: Family Medicine

## 2019-08-15 ENCOUNTER — Encounter: Payer: Self-pay | Admitting: Internal Medicine

## 2019-08-17 ENCOUNTER — Other Ambulatory Visit: Payer: Self-pay

## 2019-08-17 ENCOUNTER — Encounter: Payer: Self-pay | Admitting: Internal Medicine

## 2019-08-17 ENCOUNTER — Ambulatory Visit (INDEPENDENT_AMBULATORY_CARE_PROVIDER_SITE_OTHER): Payer: Medicare Other | Admitting: Internal Medicine

## 2019-08-17 ENCOUNTER — Ambulatory Visit (INDEPENDENT_AMBULATORY_CARE_PROVIDER_SITE_OTHER): Payer: Medicare Other

## 2019-08-17 VITALS — BP 132/72 | HR 75 | Temp 97.9°F | Ht 60.0 in | Wt 175.6 lb

## 2019-08-17 DIAGNOSIS — Z853 Personal history of malignant neoplasm of breast: Secondary | ICD-10-CM

## 2019-08-17 DIAGNOSIS — R0600 Dyspnea, unspecified: Secondary | ICD-10-CM

## 2019-08-17 DIAGNOSIS — Z9989 Dependence on other enabling machines and devices: Secondary | ICD-10-CM | POA: Diagnosis not present

## 2019-08-17 DIAGNOSIS — G4733 Obstructive sleep apnea (adult) (pediatric): Secondary | ICD-10-CM | POA: Diagnosis not present

## 2019-08-17 DIAGNOSIS — C50211 Malignant neoplasm of upper-inner quadrant of right female breast: Secondary | ICD-10-CM | POA: Diagnosis not present

## 2019-08-17 NOTE — Progress Notes (Signed)
HPI female never smoker followed for OSA, complicated by allergic rhinitis, history of right breast cancer, hypothyroid NPSG 04/21/11- AHI 47/ hr, desaturation to 85%, CPAP titrated to 8  -----------------------------------------------------------------------------------------------------------  05/14/2018-78 year old female never smoker followed for OSA, complicated by allergic rhinitis, history of right breast cancer, hypothyroid ----OSA DME: [facility]. Pt wears CPAP nightly and DL attached. Pressure works well per patient and no new supplies needed at this time.  CPAP auto 5-12/Advanced She likes her replacement CPAP machine which is working quite well for her now. Download compliance 100% AHI 2.4/hour. Denies other medical concerns.  About to leave for a family trip to [place] and [place].  08/17/2019- 78 year old female never smoker followed for OSA, complicated by allergic rhinitis, history of right breast cancer, hypothyroid CPAP auto 5-12/ [employer] Download compliance 100%, AHI 1.2/ hr Stable, feels well. On waking nose is sometimes dry or too watery. Discussed humidifier, nasal saline gel.  Breast Ca no recurrence. Asks CXR to exclude met. WTW flu vax till Oct.   ROS-see HPI + = positive Constitutional:    weight loss, night sweats, fevers, chills, fatigue, lassitude. HEENT:    headaches, difficulty swallowing, tooth/dental problems, sore throat,       Sneezing,+ itching, ear ache, +nasal congestion, post nasal drip, snoring CV:    chest pain, orthopnea, PND, swelling in lower extremities, anasarca,                                              dizziness, palpitations Resp:   shortness of breath with exertion or at rest.                productive cough,   non-productive cough, coughing up of blood.              change in color of mucus.  wheezing.   Skin:    rash or lesions. GI:  No-   heartburn, indigestion, abdominal pain, nausea, vomiting, diarrhea,                 change in bowel habits,  loss of appetite GU: dysuria, change in color of urine, no urgency or frequency.   flank pain. MS:   joint pain, stiffness, decreased range of motion, back pain. Neuro-     nothing unusual Psych:  change in mood or affect.  depression or anxiety.   memory loss.  OBJ- Physical Exam General- Alert, Oriented, Affect-appropriate, Distress- none acute + obese Skin- rash-none, lesions- none, excoriation- none Lymphadenopathy- none Head- atraumatic            Eyes- Gross vision intact, PERRLA, conjunctivae and secretions clear            Ears- Hearing, canals-normal            Nose- Clear, no-Septal dev, mucus, polyps, erosion, perforation             Throat- Mallampati IV , mucosa clear , drainage- none, tonsils- atrophic Neck- flexible , trachea midline, no stridor , thyroid nl, carotid no bruit Chest - symmetrical excursion , unlabored           Heart/CV- RRR , no murmur , no gallop  , no rub, nl s1 s2                           - JVD- none ,  edema- none, stasis changes- none, varices- none           Lung- clear to P&A, wheeze- none, cough- none , dullness-none, rub- none           Chest wall-  +Mastectomy L Abd-  Br/ Gen/ Rectal- Not done, not indicated Extrem- cyanosis- none, clubbing, none, atrophy- none, strength- nl Neuro- grossly intact to observation

## 2019-08-17 NOTE — Patient Instructions (Addendum)
We can continue CPAP auto 5-12, mask of choice, humidifier, supplies, AirView/ card  Try using an otc saline nasal spray or saline nasal gel ( AYR and several other brands ). Using something like this at bedtime and as needed may help as you adjust the CPAP humidifier.  Order- CXR  dx hx breast cancer XRT, dyspnea  Don't forget that flu shot this fall.   Please call if we can help

## 2019-08-18 ENCOUNTER — Telehealth: Payer: Self-pay | Admitting: Internal Medicine

## 2019-08-18 NOTE — Telephone Encounter (Signed)
Advised pt of results. Pt understood and nothing further is needed.   Notes recorded by Deneise Lever, MD on 08/17/2019 at 8:47 PM EDT  CXR- old surgical changes, and degenerative changes in spine. No active concerns seen in the lungs and nothing to suggest recurrence of cancer.

## 2019-08-19 ENCOUNTER — Other Ambulatory Visit: Payer: Self-pay

## 2019-08-19 DIAGNOSIS — Z20822 Contact with and (suspected) exposure to covid-19: Secondary | ICD-10-CM

## 2019-08-19 NOTE — Assessment & Plan Note (Signed)
She asks CXR, none recent, to watch for lung mets Plan- CXR

## 2019-08-19 NOTE — Assessment & Plan Note (Addendum)
Continues to benefit from CPAP with good compliance and control. Plan- continue auto 5-12, nasal saline gel

## 2019-08-21 LAB — NOVEL CORONAVIRUS, NAA: SARS-CoV-2, NAA: NOT DETECTED

## 2019-10-17 ENCOUNTER — Other Ambulatory Visit: Payer: Self-pay

## 2019-10-18 ENCOUNTER — Encounter: Payer: Self-pay | Admitting: Family Medicine

## 2019-10-18 ENCOUNTER — Ambulatory Visit (INDEPENDENT_AMBULATORY_CARE_PROVIDER_SITE_OTHER): Payer: Medicare Other

## 2019-10-18 DIAGNOSIS — Z23 Encounter for immunization: Secondary | ICD-10-CM | POA: Diagnosis not present

## 2019-10-18 NOTE — Progress Notes (Signed)
Pt came into the office to get high dose flu shot, gave injection into the right arm, pt tolerated injection well, gave information.

## 2020-01-28 ENCOUNTER — Ambulatory Visit: Payer: Medicare Other | Attending: Internal Medicine

## 2020-01-28 DIAGNOSIS — Z23 Encounter for immunization: Secondary | ICD-10-CM | POA: Insufficient documentation

## 2020-01-28 NOTE — Progress Notes (Signed)
   Covid-19 Vaccination Clinic  Name:  Shawna Marquez    MRN: [id number] DOB: March 07, 1941  01/28/2020  Shawna Marquez was observed post Covid-19 immunization for 15 minutes without incidence. She was provided with Vaccine Information Sheet and instruction to access the V-Safe system.   Shawna Marquez was instructed to call 911 with any severe reactions post vaccine: Marland Kitchen Difficulty breathing  . Swelling of your face and throat  . A fast heartbeat  . A bad rash all over your body  . Dizziness and weakness    Immunizations Administered    Name Date Dose VIS Date Route   Pfizer COVID-19 Vaccine 01/28/2020 10:38 AM 0.3 mL 11/18/2019 Intramuscular   Manufacturer: [place]   Lot: [id number]   [place]: [id number]

## 2020-02-21 ENCOUNTER — Ambulatory Visit: Payer: Medicare Other | Attending: Internal Medicine

## 2020-02-21 DIAGNOSIS — Z23 Encounter for immunization: Secondary | ICD-10-CM

## 2020-02-21 NOTE — Progress Notes (Signed)
   Covid-19 Vaccination Clinic  Name:  Shawna Marquez    MRN: [id number] DOB: 03/08/1941  02/21/2020  Shawna Marquez was observed post Covid-19 immunization for 15 minutes without incident. She was provided with Vaccine Information Sheet and instruction to access the V-Safe system.   Shawna Marquez was instructed to call 911 with any severe reactions post vaccine: Marland Kitchen Difficulty breathing  . Swelling of face and throat  . A fast heartbeat  . A bad rash all over body  . Dizziness and weakness   Immunizations Administered    Name Date Dose VIS Date Route   Pfizer COVID-19 Vaccine 02/21/2020 10:53 AM 0.3 mL 11/18/2019 Intramuscular   Manufacturer: [place]   Lot: [id number]   [place]: [id number]

## 2020-03-08 ENCOUNTER — Telehealth: Payer: Self-pay | Admitting: Family Medicine

## 2020-03-08 NOTE — Telephone Encounter (Signed)
LVM: Need to schedule patient for a physical, her last physical was in 02/2019.

## 2020-03-12 ENCOUNTER — Other Ambulatory Visit: Payer: Self-pay | Admitting: Family Medicine

## 2020-05-22 ENCOUNTER — Other Ambulatory Visit: Payer: Self-pay

## 2020-05-23 ENCOUNTER — Encounter: Payer: Self-pay | Admitting: Family Medicine

## 2020-05-23 ENCOUNTER — Ambulatory Visit (INDEPENDENT_AMBULATORY_CARE_PROVIDER_SITE_OTHER): Payer: Medicare Other | Admitting: Family Medicine

## 2020-05-23 VITALS — BP 108/70 | HR 72 | Temp 96.2°F | Ht 61.0 in | Wt 165.2 lb

## 2020-05-23 DIAGNOSIS — E785 Hyperlipidemia, unspecified: Secondary | ICD-10-CM | POA: Diagnosis not present

## 2020-05-23 DIAGNOSIS — E559 Vitamin D deficiency, unspecified: Secondary | ICD-10-CM

## 2020-05-23 DIAGNOSIS — E039 Hypothyroidism, unspecified: Secondary | ICD-10-CM

## 2020-05-23 DIAGNOSIS — Z0000 Encounter for general adult medical examination without abnormal findings: Secondary | ICD-10-CM

## 2020-05-23 LAB — BASIC METABOLIC PANEL
BUN: 20 mg/dL (ref 6–23)
CO2: 30 mEq/L (ref 19–32)
Calcium: 9.4 mg/dL (ref 8.4–10.5)
Chloride: 103 mEq/L (ref 96–112)
Creatinine, Ser: 0.67 mg/dL (ref 0.40–1.20)
GFR: 84.41 mL/min (ref >60.00)
Glucose, Bld: 89 mg/dL (ref 70–99)
Potassium: 4.4 mEq/L (ref 3.5–5.1)
Sodium: 140 mEq/L (ref 135–145)

## 2020-05-23 LAB — LIPID PANEL
Cholesterol: 155 mg/dL (ref 0–200)
HDL: 50.2 mg/dL (ref >39.00)
LDL Cholesterol: 94 mg/dL (ref 0–99)
NonHDL: 105.18
Total CHOL/HDL Ratio: 3
Triglycerides: 58 mg/dL (ref 0.0–149.0)
VLDL: 11.6 mg/dL (ref 0.0–40.0)

## 2020-05-23 LAB — T4, FREE: Free T4: 1.26 ng/dL (ref 0.60–1.60)

## 2020-05-23 LAB — TSH: TSH: 0.29 u[IU]/mL — ABNORMAL LOW (ref 0.35–4.50)

## 2020-05-23 LAB — ALT: ALT: 15 U/L (ref 0–35)

## 2020-05-23 LAB — VITAMIN D 25 HYDROXY (VIT D DEFICIENCY, FRACTURES): VITD: 40.08 ng/mL (ref 30.00–100.00)

## 2020-05-23 LAB — AST: AST: 18 U/L (ref 0–37)

## 2020-05-23 NOTE — Progress Notes (Signed)
Shawna Marquez is a 79 y.o. female  Chief Complaint  Patient presents with  . Annual Exam    Pt has no concerns today and she is fasting for lab work.    HPI: Shawna Marquez is a 79 y.o. female here for her annual exam and routine f/u on chronic medical issues including hypothyroidism, OA, OSA on CPAP. She has no issues or concerns today. She is fasting for labs.   Diet/Exercise: well balanced diet, low sodium diet; cares for husband Eye: due for exam Dental: UTD  Med refills needed today? None  She denies HA, vision changes, dizziness, CP, SOB, n/v/d/c, LE edema.  Past Medical History:  Diagnosis Date  . ALLERGIC RHINITIS 10/12/2007  . BREAST CANCER, HX OF 10/12/2007   right breast diagnosed - 11/2014  . Cancer of upper-inner quadrant of female breast ([place]) 11/17/2014  . Complication of anesthesia    bp will drop,hard to wake up  . DIVERTICULITIS, HX OF 10/12/2007  . Family history of breast cancer   . HYPOTHYROIDISM, PRIMARY 10/12/2007  . OSTEOARTHRITIS 09/09/2007   back   . Radiation 04/10/15-05/25/15   Right Breast  . Sleep apnea    USES cpap NIGHTLY  . Wears glasses     Past Surgical History:  Procedure Laterality Date  . ABDOMINAL HYSTERECTOMY  1977  . COLON SURGERY  2006   part colectomy  . COLONOSCOPY  2007  . colovaginal fistula  2007   colon resection  . EYE SURGERY     both cataracts  . HERNIA REPAIR  2007   ventral  . MASTECTOMY Left 1997   lt mast-axillary node dissection  . PORTACATH PLACEMENT Left 01/01/2015   Procedure: INSERTION PORT-A-CATH;  Surgeon: Rolm Bookbinder, MD;  Location: [facility];  Service: General;  Laterality: Left;  . RADIOACTIVE SEED GUIDED PARTIAL MASTECTOMY WITH AXILLARY SENTINEL LYMPH NODE BIOPSY Right 11/28/2014   Procedure: RADIOACTIVE SEED GUIDED RIGHT LUMPECTOMY WITH RIGHT AXILLARY SENTINEL LYMPH NODE BIOPSY;  Surgeon: Rolm Bookbinder, MD;  Location: [facility];  Service: General;  Laterality: Right;  . RE-EXCISION  OF BREAST CANCER,SUPERIOR MARGINS Right 01/01/2015   Procedure: RIGHT BREAST MARGIN EXCISION;  Surgeon: Rolm Bookbinder, MD;  Location: [facility];  Service: General;  Laterality: Right;  . TONSILLECTOMY    . WISDOM TOOTH EXTRACTION      Social History   Socioeconomic History  . Marital status: Married    Spouse name: Not on file  . Number of children: 3  . Years of education: Not on file  . Highest education level: Not on file  Occupational History  . Not on file  Tobacco Use  . Smoking status: Never Smoker  . Smokeless tobacco: Never Used  Substance and Sexual Activity  . Alcohol use: Yes    Alcohol/week: 0.0 standard drinks    Comment: occassional beer  . Drug use: No  . Sexual activity: Yes  Other Topics Concern  . Not on file  Social History Narrative  . Not on file   Social Determinants of Health   Financial Resource Strain:   . Difficulty of Paying Living Expenses:   Food Insecurity:   . Worried About Charity fundraiser in the Last Year:   . Arboriculturist in the Last Year:   Transportation Needs:   . Film/video editor (Medical):   Marland Kitchen Lack of Transportation (Non-Medical):   Physical Activity:   . Days of Exercise per Week:   .  Minutes of Exercise per Session:   Stress:   . Feeling of Stress :   Social Connections:   . Frequency of Communication with Friends and Family:   . Frequency of Social Gatherings with Friends and Family:   . Attends Religious Services:   . Active Member of Clubs or Organizations:   . Attends Archivist Meetings:   Marland Kitchen Marital Status:   Intimate Partner Violence:   . Fear of Current or Ex-Partner:   . Emotionally Abused:   Marland Kitchen Physically Abused:   . Sexually Abused:     Family History  Problem Relation Age of Onset  . Breast cancer Paternal Grandmother 36  . Lung cancer Father 42       smoker  . Prostate cancer Cousin        paternal cousin   . Heart disease Neg Hx        family hx  . Cancer Neg Hx        lung  ca  . COPD Neg Hx        family hx  . Colon cancer Neg Hx      Immunization History  Administered Date(s) Administered  . Fluad Quad(high Dose 65+) 10/18/2019  . Influenza Split 10/10/2013  . Influenza Whole 09/07/2000  . Influenza, High Dose Seasonal PF 10/27/2014, 09/27/2015, 10/09/2016, 09/24/2017  . Influenza-Unspecified 10/15/2018  . PFIZER SARS-COV-2 Vaccination 01/28/2020, 02/21/2020  . Pneumococcal Conjugate-13 02/24/2014  . Pneumococcal Polysaccharide-23 02/06/2004  . Td 03/18/1996  . Tdap 02/08/2013    Outpatient Encounter Medications as of 05/23/2020  Medication Sig  . aspirin EC 81 MG tablet Take 81 mg by mouth at bedtime. Reported on 02/25/2016  . Calcium Citrate-Vitamin D (CALCIUM CITRATE + D3 PO) Take 1 tablet by mouth 2 (two) times daily. Calcium 500 mg, Vitamin D 800 units  . cetirizine (ZYRTEC) 10 MG tablet Take 10 mg by mouth daily.  . cholecalciferol (VITAMIN D) 1000 UNITS tablet Take 1,000 Units by mouth daily with lunch.   . clobetasol cream (TEMOVATE) 0.05 % APPLY TO AFFECTED AREA UP TO 2 TIMES DAILY AS NEEDED.NOT FACE,GROIN OR AXILLA  . Coenzyme Q10 (COQ10) 200 MG CAPS Take 200 mg by mouth every other day.  . exemestane (AROMASIN) 25 MG tablet TAKE 1 TABLET BY MOUTH EVERY DAY AFTER BREAKFAST  . fluticasone (FLONASE) 50 MCG/ACT nasal spray Place 2 sprays into both nostrils daily.  . folic acid (FOLVITE) 235 MCG tablet Take 400 mcg by mouth daily with lunch.   . hydrocortisone cream 1 % Apply 1 application topically daily as needed for itching (eczema).  Marland Kitchen levothyroxine (SYNTHROID) 125 MCG tablet Take 1qam (Plz sched appt for future fills)  . Multiple Vitamin (MULTIVITAMIN WITH MINERALS) TABS tablet Take 1 tablet by mouth daily with lunch.  . non-metallic deodorant Jethro Poling) MISC Apply 1 application topically daily as needed.  . Omega-3 Fatty Acids (FISH OIL) 1000 MG CAPS Take 1,000 mg by mouth daily with lunch.  . Turmeric 450 MG CAPS Take 450 mg by mouth daily  with lunch.   . vitamin C (ASCORBIC ACID) 500 MG tablet Take 500 mg by mouth 2 (two) times daily.   No facility-administered encounter medications on file as of 05/23/2020.     ROS: Gen: no fever, chills  Skin: no rash, itching ENT: no ear pain, ear drainage, nasal congestion, rhinorrhea, sinus pressure, sore throat Eyes: no blurry vision, double vision Resp: no cough, wheeze,SOB Breast: no breast tenderness, no nipple discharge, no  breast masses CV: no CP, palpitations, LE edema,  GI: no heartburn, n/v/d/c, abd pain GU: no dysuria, urgency, frequency, hematuria; no vaginal itching, odor, discharge MSK: no joint pain, myalgias, back pain Neuro: no dizziness, headache, weakness, vertigo Psych: no depression, anxiety, insomnia   No Known Allergies  BP 108/70 (BP Location: Right Arm, Patient Position: Sitting, Cuff Size: Normal)   Pulse 72   Temp (!) 96.2 F (35.7 C)   Ht 5\' 1"  (1.549 m)   Wt 165 lb 3.2 oz (74.9 kg)   SpO2 97%   BMI 31.21 kg/m   Physical Exam Constitutional:      General: She is not in acute distress.    Appearance: She is well-developed.  HENT:     Head: Normocephalic and atraumatic.     Right Ear: Tympanic membrane and ear canal normal.     Left Ear: Tympanic membrane and ear canal normal.     Nose: Nose normal.  Eyes:     Conjunctiva/sclera: Conjunctivae normal.     Pupils: Pupils are equal, round, and reactive to light.  Neck:     Thyroid: No thyromegaly.  Cardiovascular:     Rate and Rhythm: Normal rate and regular rhythm.     Heart sounds: Normal heart sounds. No murmur heard.   Pulmonary:     Effort: Pulmonary effort is normal. No respiratory distress.     Breath sounds: Normal breath sounds. No wheezing or rhonchi.  Abdominal:     General: Bowel sounds are normal. There is no distension.     Palpations: Abdomen is soft. There is no mass.     Tenderness: There is no abdominal tenderness.  Musculoskeletal:     Cervical back: Neck supple.    Lymphadenopathy:     Cervical: No cervical adenopathy.  Skin:    General: Skin is warm and dry.  Neurological:     Mental Status: She is alert and oriented to person, place, and time.     Motor: No abnormal muscle tone.     Coordination: Coordination normal.  Psychiatric:        Behavior: Behavior normal.      A/P:  1. Hypothyroidism, unspecified type - stable - on levothyroxine 152mcg daily - TSH - T4, free  2. Dyslipidemia - diet-controlled - ALT - AST - Basic metabolic panel - Lipid panel  3. Vitamin D deficiency - on Vit D 1000IU daily - Basic metabolic panel - VITAMIN D 25 Hydroxy (Vit-D Deficiency, Fractures)  4. Annual physical exam - UTD on dental and vision exams, immunizations - cont with overall healthy diet and regular exercise as tolerated   This visit occurred during the SARS-CoV-2 public health emergency.  Safety protocols were in place, including screening questions prior to the visit, additional usage of staff PPE, and extensive cleaning of exam room while observing appropriate contact time as indicated for disinfecting solutions.

## 2020-06-07 ENCOUNTER — Other Ambulatory Visit: Payer: Self-pay | Admitting: Family Medicine

## 2020-06-07 NOTE — Telephone Encounter (Signed)
Last OV 05/23/20 Last fill 03/12/20  #90/0

## 2020-06-11 ENCOUNTER — Other Ambulatory Visit: Payer: Self-pay | Admitting: Family Medicine

## 2020-06-11 DIAGNOSIS — J301 Allergic rhinitis due to pollen: Secondary | ICD-10-CM

## 2020-06-13 NOTE — Telephone Encounter (Signed)
Last OV 05/23/20 Last fill 03/30/19  #48g/3

## 2020-06-25 NOTE — Progress Notes (Signed)
Patient Care Team: Ronnald Nian, DO as PCP - General (Family Medicine) Rolm Bookbinder, MD as Consulting Physician (General Surgery) Nicholas Lose, MD as Consulting Physician (Hematology and Oncology) Thea Silversmith, MD as Consulting Physician (Radiation Oncology) Holley Bouche, NP (Inactive) as Nurse Practitioner (Nurse Practitioner) Sylvan Cheese, NP as Nurse Practitioner (Nurse Practitioner)  DIAGNOSIS:    ICD-10-CM   1. 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 breast ([place])  C50.211     SUMMARY OF ONCOLOGIC HISTORY: Oncology History  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 breast ([place])  06/07/1996 Miscellaneous   History of Stage IIB left breast cancer S/P mastectomy and chemotherapy (Adriamycin and Cytoxan) on CALGB 9397.   11/10/2014 Mammogram   Right breast: 9 mm mass    11/13/2014 Breast US   Right breast: 9 mm mass at 1:00 position with indistinct margin, middle depth, hypoechoic.   11/15/2014 Initial Biopsy   Right breast needle biopsy: Invasive ductal carcinoma, grade 2, with DCIS with lymphovascular invasion ER+ (100%), PR+ (100%), HER-2/neu negative (ratio 1.87), Ki-67 23%   11/15/2014 Clinical Stage   Stage IA; T1b N0   11/28/2014 Definitive Surgery   Right breast lumpectomy with SLNB Donne Hazel): Invasive ductal carcinoma, grade 3, 1.7 cm, with DCIS, focal vascular involvement, posterior margin involvement, 1/2 sentinel nodes positive for malignancy, repeat HER2/neu negative (ratio 1.14)   11/28/2014 Procedure   Mammaprint: High Risk Luminal Type B.    11/28/2014 Pathologic Stage   Stage IIA: pT1c, pN1a   01/01/2015 Surgery   Rexcision of right breast margin: surgical margins negative for malignancy   01/15/2015 - 03/20/2015 Chemotherapy   Adjuvant chemotherapy with Taxotere and Cyclophosphamide X 4 cycles (Makani Seckman)   04/10/2015 - 05/25/2015 Radiation Therapy   Adjuvant XRT completed Pablo Ledger): Right breast  45 Gy over 25 fractions; Right breast boost 16 Gy at 2 Gy over 8 fractions. Total dose 61 Gy   05/31/2015 -  Anti-estrogen oral therapy   Anastrozole 1 mg daily stopped at 04/03/2016 due to tendinitis switched to Femara 04/21/2016 discontinued June 2017, starting exemestane 09/11/2016   08/03/2015 Survivorship   Survivorship visit completed and copy of survivorship care plan provided to patient.     CHIEF COMPLIANT: Follow-up of recurrent right breast cancer on exemestane therapy  INTERVAL HISTORY: Shawna Marquez is a 79 y.o. with above-mentioned history of recurrent right breast cancer who is currently on anti-estrogen therapy with exemestane. She presents to the clinic today for annual follow-up.   ALLERGIES:  has No Known Allergies.  MEDICATIONS:  Current Outpatient Medications  Medication Sig Dispense Refill  . aspirin EC 81 MG tablet Take 81 mg by mouth at bedtime. Reported on 02/25/2016    . Calcium Citrate-Vitamin D (CALCIUM CITRATE + D3 PO) Take 1 tablet by mouth 2 (two) times daily. Calcium 500 mg, Vitamin D 800 units    . cetirizine (ZYRTEC) 10 MG tablet Take 10 mg by mouth daily.    . cholecalciferol (VITAMIN D) 1000 UNITS tablet Take 1,000 Units by mouth daily with lunch.     . clobetasol cream (TEMOVATE) 0.05 % APPLY TO AFFECTED AREA UP TO 2 TIMES DAILY AS NEEDED.NOT FACE,GROIN OR AXILLA  3  . Coenzyme Q10 (COQ10) 200 MG CAPS Take 200 mg by mouth every other day.    . exemestane (AROMASIN) 25 MG tablet TAKE 1 TABLET BY MOUTH EVERY DAY AFTER BREAKFAST 90 tablet 3  . fluticasone (FLONASE) 50 MCG/ACT nasal spray SPRAY 2  SPRAYS INTO EACH NOSTRIL EVERY DAY 48 mL 3  . folic acid (FOLVITE) 481 MCG tablet Take 400 mcg by mouth daily with lunch.     . hydrocortisone cream 1 % Apply 1 application topically daily as needed for itching (eczema).    Marland Kitchen levothyroxine (SYNTHROID) 125 MCG tablet TAKE 1 TABLET BY MOUTH DAILY IN THE MORNING. NEED APPT FOR REFILLS 90 tablet 0  . Multiple Vitamin  (MULTIVITAMIN WITH MINERALS) TABS tablet Take 1 tablet by mouth daily with lunch.    . non-metallic deodorant Jethro Poling) MISC Apply 1 application topically daily as needed.    . Omega-3 Fatty Acids (FISH OIL) 1000 MG CAPS Take 1,000 mg by mouth daily with lunch.    . Turmeric 450 MG CAPS Take 450 mg by mouth daily with lunch.     . vitamin C (ASCORBIC ACID) 500 MG tablet Take 500 mg by mouth 2 (two) times daily.     No current facility-administered medications for this visit.    PHYSICAL EXAMINATION: ECOG PERFORMANCE STATUS: 1 - Symptomatic but completely ambulatory  Vitals:   06/26/20 0913  BP: 137/75  Pulse: 70  Resp: 17  Temp: 98.5 F (36.9 C)  SpO2: 100%   Filed Weights   06/26/20 0913  Weight: 167 lb 9.6 oz (76 kg)    BREAST: No palpable masses or nodules in either right or left breasts. No palpable axillary supraclavicular or infraclavicular adenopathy no breast tenderness or nipple discharge. (exam performed in the presence of a chaperone)  LABORATORY DATA:  I have reviewed the data as listed CMP Latest Ref Rng & Units 05/23/2020 02/14/2019 02/09/2019  Glucose 70 - 99 mg/dL 89 - 71  BUN 6 - 23 mg/dL 20 - 15  Creatinine 0.40 - 1.20 mg/dL 0.67 - 0.70  Sodium 135 - 145 mEq/L 140 - 139  Potassium 3.5 - 5.1 mEq/L 4.4 - 4.5  Chloride 96 - 112 mEq/L 103 - 102  CO2 19 - 32 mEq/L 30 - 23  Calcium 8.4 - 10.5 mg/dL 9.4 - 9.7  Total Protein 6.0 - 8.3 g/dL - - -  Total Bilirubin 0.2 - 1.2 mg/dL - - -  Alkaline Phos 39 - 117 U/L - - -  AST 0 - 37 U/L 18 17 -  ALT 0 - 35 U/L 15 15 -    Lab Results  Component Value Date   WBC 4.4 01/27/2018   HGB 14.0 01/27/2018   HCT 41.5 01/27/2018   MCV 94.6 01/27/2018   PLT 221.0 01/27/2018   NEUTROABS 2.7 01/27/2018    ASSESSMENT & PLAN:  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 breast 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, focal vascular involvement, posterior margin involvement, 1/2 sentinel node  positive, T1 cN1 M0 stage II a, Mammaprint luminal type B, high risk, disease free survival with chemotherapy and hormonal therapy 88% versus 76% with hormonal therapy alone;   Treatment summary: Adjuvant chemotherapy with Taxotere and Cytoxan every 3 weeks 4 cycles started 01/15/15 completed 03/19/15, followed by adjuvant radiation therapy 04/10/15- 05/25/2015  Started anastrozole 5 years 05/31/2015 stopped 04/03/2016 due to tendinitis involving bilateral wrists Started letrozole 04/14/2016; changed to exemestane October 2017  Exemestanetoxicities: Marked improvement in arthralgias and myalgias. Plan is to treat her for 7 years total.  Neuropathy: Improving slowly with time. We'll continue to watch and observe. Bone density 04/09/2016: Osteopenia T score -1.9 continue with calcium and vitamin D  Breast Cancer Surveillance: 1. Breast exam7/20/2021:  No abnormalities 2. Mammograms: 06/08/2018 at [facility]   Return to clinic in1 year for follow-up     No orders of the defined types were placed in this encounter.  The patient has a good understanding of the overall plan. she agrees with it. she will call with any problems that may develop before the next visit here.  Total time spent: 20 mins including face to face time and time spent for planning, charting and coordination of care  Nicholas Lose, MD 06/26/2020  I, Cloyde Reams Dorshimer, am acting as scribe for Dr. Nicholas Lose.  I have reviewed the above documentation for accuracy and completeness, and I agree with the above.

## 2020-06-26 ENCOUNTER — Other Ambulatory Visit: Payer: Self-pay

## 2020-06-26 ENCOUNTER — Telehealth: Payer: Self-pay | Admitting: Hematology and Oncology

## 2020-06-26 ENCOUNTER — Inpatient Hospital Stay: Payer: Medicare Other | Attending: Hematology and Oncology | Admitting: Hematology and Oncology

## 2020-06-26 DIAGNOSIS — Z170 Estrogen receptor positive status [ER+]: Secondary | ICD-10-CM | POA: Diagnosis not present

## 2020-06-26 DIAGNOSIS — Z9221 Personal history of antineoplastic chemotherapy: Secondary | ICD-10-CM | POA: Insufficient documentation

## 2020-06-26 DIAGNOSIS — Z923 Personal history of irradiation: Secondary | ICD-10-CM | POA: Insufficient documentation

## 2020-06-26 DIAGNOSIS — M8580 Other specified disorders of bone density and structure, unspecified site: Secondary | ICD-10-CM | POA: Insufficient documentation

## 2020-06-26 DIAGNOSIS — Z7982 Long term (current) use of aspirin: Secondary | ICD-10-CM | POA: Diagnosis not present

## 2020-06-26 DIAGNOSIS — Z79811 Long term (current) use of aromatase inhibitors: Secondary | ICD-10-CM | POA: Diagnosis not present

## 2020-06-26 DIAGNOSIS — H52203 Unspecified astigmatism, bilateral: Secondary | ICD-10-CM | POA: Diagnosis not present

## 2020-06-26 DIAGNOSIS — C50211 Malignant neoplasm of upper-inner quadrant of right female breast: Secondary | ICD-10-CM | POA: Diagnosis not present

## 2020-06-26 DIAGNOSIS — H5310 Unspecified subjective visual disturbances: Secondary | ICD-10-CM | POA: Diagnosis not present

## 2020-06-26 MED ORDER — EXEMESTANE 25 MG PO TABS: ORAL_TABLET | ORAL | 3 refills | Status: DC

## 2020-06-26 NOTE — Assessment & Plan Note (Signed)
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, focal vascular involvement, posterior margin involvement, 1/2 sentinel node positive, T1 cN1 M0 stage II a, Mammaprint luminal type B, high risk, disease free survival with chemotherapy and hormonal therapy 88% versus 76% with hormonal therapy alone;   Treatment summary: Adjuvant chemotherapy with Taxotere and Cytoxan every 3 weeks 4 cycles started 01/15/15 completed 03/19/15, followed by adjuvant radiation therapy 04/10/15- 05/25/2015  Started anastrozole 5 years 05/31/2015 stopped 04/03/2016 due to tendinitis involving bilateral wrists Started letrozole 04/14/2016; changed to exemestane October 2017  Exemestanetoxicities: Marked improvement in arthralgias and myalgias. Plan is to treat her for 7 years total.  Neuropathy: Improving slowly with time. We'll continue to watch and observe. Bone density 04/09/2016: Osteopenia T score -1.9 continue with calcium and vitamin D  Breast Cancer Surveillance: 1. Breast exam7/20/2021: No abnormalities 2. Mammograms: 06/08/2018 at [facility]   Return to clinic in1 year for follow-up

## 2020-06-26 NOTE — Telephone Encounter (Signed)
Scheduled per los. Gave avs and calendar  

## 2020-07-05 ENCOUNTER — Encounter: Payer: Self-pay | Admitting: Hematology and Oncology

## 2020-07-05 DIAGNOSIS — Z853 Personal history of malignant neoplasm of breast: Secondary | ICD-10-CM | POA: Diagnosis not present

## 2020-07-05 DIAGNOSIS — R928 Other abnormal and inconclusive findings on diagnostic imaging of breast: Secondary | ICD-10-CM | POA: Diagnosis not present

## 2020-08-01 DIAGNOSIS — L820 Inflamed seborrheic keratosis: Secondary | ICD-10-CM | POA: Diagnosis not present

## 2020-08-01 DIAGNOSIS — L308 Other specified dermatitis: Secondary | ICD-10-CM | POA: Diagnosis not present

## 2020-08-16 ENCOUNTER — Other Ambulatory Visit: Payer: Self-pay

## 2020-08-16 ENCOUNTER — Encounter: Payer: Self-pay | Admitting: Internal Medicine

## 2020-08-16 ENCOUNTER — Ambulatory Visit (INDEPENDENT_AMBULATORY_CARE_PROVIDER_SITE_OTHER): Payer: Medicare Other | Admitting: Internal Medicine

## 2020-08-16 DIAGNOSIS — C50211 Malignant neoplasm of upper-inner quadrant of right female breast: Secondary | ICD-10-CM

## 2020-08-16 DIAGNOSIS — Z9989 Dependence on other enabling machines and devices: Secondary | ICD-10-CM

## 2020-08-16 DIAGNOSIS — G4733 Obstructive sleep apnea (adult) (pediatric): Secondary | ICD-10-CM

## 2020-08-16 NOTE — Patient Instructions (Signed)
We can continue CPAP auto 5-12   Please call if we can help 

## 2020-08-16 NOTE — Progress Notes (Signed)
HPI °female never smoker followed for OSA, complicated by allergic rhinitis, history of right breast cancer, hypothyroid °NPSG 04/21/11- AHI 47/ hr, desaturation to 85%, CPAP titrated to 8 ° °----------------------------------------------------------------------------------------------------------- °. ° °08/17/2019- 78-year-old female never smoker followed for OSA, complicated by allergic rhinitis, history of right breast cancer, hypothyroid °CPAP auto 5-12/ [employer] °Download compliance 100%, AHI 1.2/ hr °Stable, feels well. On waking nose is sometimes dry or too watery. Discussed humidifier, nasal saline gel.  °Breast Ca no recurrence. Asks CXR to exclude met. °WTW flu vax till Oct.  ° °08/15/20- 79-year-old female never smoker followed for OSA, complicated by allergic rhinitis, history of right breast cancer, hypothyroid °CPAP auto 5-12/ [employer] °OSA on CPAP, Hx of breast cancer °Download compliance 100%, AHI 1.2/ hr °Body weight today 168 lbs °She denies problems with CPAP and feels she is sleeping well. °Covid vax- 2 Phizer °CXR 08/17/2019- °FINDINGS: °Cardiac shadows within normal limits. Postsurgical changes are again °noted on the left. Previously seen left-sided chest port has been °removed. Lungs are well aerated bilaterally. No focal infiltrate or °sizable effusion is seen. Degenerative changes of the thoracic spine °are noted.  °IMPRESSION: °No acute abnormality noted. ° °ROS-see HPI + = positive °Constitutional:    weight loss, night sweats, fevers, chills, fatigue, lassitude. °HEENT:    headaches, difficulty swallowing, tooth/dental problems, sore throat,  °     Sneezing,+ itching, ear ache, +nasal congestion, post nasal drip, snoring °CV:    chest pain, orthopnea, PND, swelling in lower extremities, anasarca,                                              ° dizziness, palpitations °Resp:   shortness of breath with exertion or at rest.   °             productive cough,   non-productive cough, coughing up of blood.    °           change in color of mucus.  wheezing.   °Skin:    rash or lesions. °GI:  No-   heartburn, indigestion, abdominal pain, nausea, vomiting, diarrhea,  °               change in bowel habits, loss of appetite °GU: dysuria, change in color of urine, no urgency or frequency.   flank pain. °MS:   joint pain, stiffness, decreased range of motion, back pain. °Neuro-     nothing unusual °Psych:  change in mood or affect.  depression or anxiety.   memory loss. ° °OBJ- Physical Exam °General- Alert, Oriented, Affect-appropriate, Distress- none acute + overweight °Skin- rash-none, lesions- none, excoriation- none °Lymphadenopathy- none °Head- atraumatic °           Eyes- Gross vision intact, PERRLA, conjunctivae and secretions clear °           Ears- Hearing, canals-normal °           Nose- Clear, no-Septal dev, mucus, polyps, erosion, perforation  °           Throat- Mallampati IV , mucosa clear , drainage- none, tonsils- atrophic °Neck- flexible , trachea midline, no stridor , thyroid nl, carotid no bruit °Chest - symmetrical excursion , unlabored °          Heart/CV- RRR , no murmur , no gallop  , no rub, nl s1 s2 °                          -   JVD- none , edema- none, stasis changes- none, varices- none °          Lung- clear to P&A, wheeze- none, cough- none , dullness-none, rub- none °          Chest wall-  +Mastectomy L °Abd-  °Br/ Gen/ Rectal- Not done, not indicated °Extrem- cyanosis- none, clubbing, none, atrophy- none, strength- nl °Neuro- grossly intact to observation ° ° ° ° °

## 2020-08-28 ENCOUNTER — Other Ambulatory Visit: Payer: Self-pay | Admitting: Family Medicine

## 2020-08-28 NOTE — Assessment & Plan Note (Signed)
Benefits from CPAP. She is satisfied to cocntinue Plan- continue auto 5-12

## 2020-08-28 NOTE — Assessment & Plan Note (Signed)
Continues Oncology f/u and management without recurrence

## 2020-10-01 ENCOUNTER — Telehealth: Payer: Self-pay | Admitting: Family Medicine

## 2020-10-01 NOTE — Telephone Encounter (Signed)
Left message for patient to schedule Annual Wellness Visit.  Please schedule with Nurse Health Advisor Martha Stanley, RN at [facility]  °

## 2020-10-03 DIAGNOSIS — Z23 Encounter for immunization: Secondary | ICD-10-CM | POA: Diagnosis not present

## 2020-10-05 ENCOUNTER — Other Ambulatory Visit: Payer: Self-pay | Admitting: Family Medicine

## 2020-10-23 ENCOUNTER — Other Ambulatory Visit: Payer: Self-pay

## 2020-10-24 ENCOUNTER — Ambulatory Visit (INDEPENDENT_AMBULATORY_CARE_PROVIDER_SITE_OTHER): Payer: Medicare Other

## 2020-10-24 ENCOUNTER — Other Ambulatory Visit: Payer: Medicare Other

## 2020-10-24 DIAGNOSIS — Z23 Encounter for immunization: Secondary | ICD-10-CM | POA: Diagnosis not present

## 2020-10-24 NOTE — Progress Notes (Signed)
Per orders of Dr. Bryan Lemma injection of Influenza Vaccine, high dose given by Trystian Crisanto L Consepcion Utt in right deltoid. Patient tolerated injection well.

## 2020-10-24 NOTE — Patient Instructions (Signed)
There are no preventive care reminders to display for this patient.  Depression screen [facility] 2/9 05/23/2020 02/09/2019 01/27/2018  Decreased Interest 0 0 0  Down, Depressed, Hopeless 0 0 0  PHQ - 2 Score 0 0 0

## 2020-11-08 ENCOUNTER — Ambulatory Visit (INDEPENDENT_AMBULATORY_CARE_PROVIDER_SITE_OTHER): Payer: Medicare Other | Admitting: Family Medicine

## 2020-11-08 ENCOUNTER — Encounter: Payer: Self-pay | Admitting: Family Medicine

## 2020-11-08 ENCOUNTER — Other Ambulatory Visit: Payer: Self-pay

## 2020-11-08 VITALS — BP 120/68 | HR 78 | Temp 97.6°F | Ht 61.0 in | Wt 168.0 lb

## 2020-11-08 DIAGNOSIS — H61892 Other specified disorders of left external ear: Secondary | ICD-10-CM | POA: Diagnosis not present

## 2020-11-08 DIAGNOSIS — H8112 Benign paroxysmal vertigo, left ear: Secondary | ICD-10-CM | POA: Diagnosis not present

## 2020-11-08 DIAGNOSIS — E559 Vitamin D deficiency, unspecified: Secondary | ICD-10-CM | POA: Diagnosis not present

## 2020-11-08 DIAGNOSIS — R5383 Other fatigue: Secondary | ICD-10-CM | POA: Diagnosis not present

## 2020-11-08 DIAGNOSIS — E039 Hypothyroidism, unspecified: Secondary | ICD-10-CM

## 2020-11-08 LAB — TSH: TSH: 1.1 u[IU]/mL (ref 0.35–4.50)

## 2020-11-08 LAB — VITAMIN B12: Vitamin B-12: 303 pg/mL (ref 211–911)

## 2020-11-08 LAB — T4, FREE: Free T4: 0.91 ng/dL (ref 0.60–1.60)

## 2020-11-08 LAB — VITAMIN D 25 HYDROXY (VIT D DEFICIENCY, FRACTURES): VITD: 26.49 ng/mL — ABNORMAL LOW (ref 30.00–100.00)

## 2020-11-08 MED ORDER — FLUOCINOLONE ACETONIDE 0.01 % OT OIL: 5.0000 [drp] | TOPICAL_OIL | Freq: Two times a day (BID) | OTIC | 1 refills | Status: DC

## 2020-11-08 NOTE — Progress Notes (Signed)
Shawna Marquez is a 79 y.o. female  Chief Complaint  Patient presents with  . Follow-up    c/o having dizzy spells off/on x 3 weeks when she gets up to move around.  also having some LT ear itching & hearing cracklin sounds out of it    HPI: Shawna Marquez is a 79 y.o. female who complains of 3wk h/o of intermittent dizziness that occurs when she changes position. Dizziness lasts 8-10 sec. Dizziness has lessened. No ear pain. No decreased hearing.  No headache. No vision changes. No issues with speech. No weakness or numbness or tingling.  She notes a sensation yesterday on her Lt cheek that felt like a feather brushing against it a few times.   BP Readings from Last 3 Encounters:  11/08/20 120/68  08/16/20 120/72  06/26/20 137/75   She also notes Lt ear itching and a "crackling" sound.    She notes increased stress and fatigue. Her husband gets in/out of bed all night and needs help getting back in each time so pt gets woken up.    Past Medical History:  Diagnosis Date  . ALLERGIC RHINITIS 10/12/2007  . BREAST CANCER, HX OF 10/12/2007   right breast diagnosed - 11/2014  . Cancer of upper-inner quadrant of female breast ([facility]) 11/17/2014  . Complication of anesthesia    bp will drop,hard to wake up  . DIVERTICULITIS, HX OF 10/12/2007  . Family history of breast cancer   . HYPOTHYROIDISM, PRIMARY 10/12/2007  . OSTEOARTHRITIS 09/09/2007   back   . Radiation 04/10/15-05/25/15   Right Breast  . Sleep apnea    USES cpap NIGHTLY  . Wears glasses     Past Surgical History:  Procedure Laterality Date  . ABDOMINAL HYSTERECTOMY  1977  . COLON SURGERY  2006   part colectomy  . COLONOSCOPY  2007  . colovaginal fistula  2007   colon resection  . EYE SURGERY     both cataracts  . HERNIA REPAIR  2007   ventral  . MASTECTOMY Left 1997   lt mast-axillary node dissection  . PORTACATH PLACEMENT Left 01/01/2015   Procedure: INSERTION PORT-A-CATH;  Surgeon: Rolm Bookbinder, MD;  Location:  [facility];  Service: General;  Laterality: Left;  . RADIOACTIVE SEED GUIDED PARTIAL MASTECTOMY WITH AXILLARY SENTINEL LYMPH NODE BIOPSY Right 11/28/2014   Procedure: RADIOACTIVE SEED GUIDED RIGHT LUMPECTOMY WITH RIGHT AXILLARY SENTINEL LYMPH NODE BIOPSY;  Surgeon: Rolm Bookbinder, MD;  Location: [facility];  Service: General;  Laterality: Right;  . RE-EXCISION OF BREAST CANCER,SUPERIOR MARGINS Right 01/01/2015   Procedure: RIGHT BREAST MARGIN EXCISION;  Surgeon: Rolm Bookbinder, MD;  Location: [facility];  Service: General;  Laterality: Right;  . TONSILLECTOMY    . WISDOM TOOTH EXTRACTION      Social History   Socioeconomic History  . Marital status: Married    Spouse name: Not on file  . Number of children: 3  . Years of education: Not on file  . Highest education level: Not on file  Occupational History  . Not on file  Tobacco Use  . Smoking status: Never Smoker  . Smokeless tobacco: Never Used  Substance and Sexual Activity  . Alcohol use: Yes    Alcohol/week: 0.0 standard drinks    Comment: occassional beer  . Drug use: No  . Sexual activity: Yes  Other Topics Concern  . Not on file  Social History Narrative  . Not on file   Social Determinants of  Health   Financial Resource Strain:   . Difficulty of Paying Living Expenses: Not on file  Food Insecurity:   . Worried About Charity fundraiser in the Last Year: Not on file  . Ran Out of Food in the Last Year: Not on file  Transportation Needs:   . Lack of Transportation (Medical): Not on file  . Lack of Transportation (Non-Medical): Not on file  Physical Activity:   . Days of Exercise per Week: Not on file  . Minutes of Exercise per Session: Not on file  Stress:   . Feeling of Stress : Not on file  Social Connections:   . Frequency of Communication with Friends and Family: Not on file  . Frequency of Social Gatherings with Friends and Family: Not on file  . Attends Religious Services: Not on file  .  Active Member of Clubs or Organizations: Not on file  . Attends Archivist Meetings: Not on file  . Marital Status: Not on file  Intimate Partner Violence:   . Fear of Current or Ex-Partner: Not on file  . Emotionally Abused: Not on file  . Physically Abused: Not on file  . Sexually Abused: Not on file    Family History  Problem Relation Age of Onset  . Breast cancer Paternal Grandmother 43  . Lung cancer Father 18       smoker  . Prostate cancer Cousin        paternal cousin   . Heart disease Neg Hx        family hx  . Cancer Neg Hx        lung ca  . COPD Neg Hx        family hx  . Colon cancer Neg Hx      Immunization History  Administered Date(s) Administered  . Fluad Quad(high Dose 65+) 10/18/2019, 10/24/2020  . Influenza Split 10/10/2013  . Influenza Whole 09/07/2000  . Influenza, High Dose Seasonal PF 10/27/2014, 09/27/2015, 10/09/2016, 09/24/2017  . Influenza-Unspecified 10/15/2018  . PFIZER SARS-COV-2 Vaccination 01/28/2020, 02/21/2020, 10/03/2020  . Pneumococcal Conjugate-13 02/24/2014  . Pneumococcal Polysaccharide-23 02/06/2004  . Td 03/18/1996  . Tdap 02/08/2013    Outpatient Encounter Medications as of 11/08/2020  Medication Sig  . aspirin EC 81 MG tablet Take 81 mg by mouth at bedtime. Reported on 02/25/2016  . Calcium Citrate-Vitamin D (CALCIUM CITRATE + D3 PO) Take 1 tablet by mouth 2 (two) times daily. Calcium 500 mg, Vitamin D 800 units  . cetirizine (ZYRTEC) 10 MG tablet Take 10 mg by mouth daily.  . cholecalciferol (VITAMIN D) 1000 UNITS tablet Take 1,000 Units by mouth daily with lunch.   . clobetasol cream (TEMOVATE) 0.05 % APPLY TO AFFECTED AREA UP TO 2 TIMES DAILY AS NEEDED.NOT FACE,GROIN OR AXILLA  . Coenzyme Q10 (COQ10) 200 MG CAPS Take 200 mg by mouth every other day.  . exemestane (AROMASIN) 25 MG tablet TAKE 1 TABLET BY MOUTH EVERY DAY AFTER BREAKFAST  . fluticasone (FLONASE) 50 MCG/ACT nasal spray SPRAY 2 SPRAYS INTO EACH NOSTRIL  EVERY DAY  . folic acid (FOLVITE) 202 MCG tablet Take 400 mcg by mouth daily with lunch.   . hydrocortisone cream 1 % Apply 1 application topically daily as needed for itching (eczema).  Marland Kitchen levothyroxine (SYNTHROID) 125 MCG tablet TAKE 1 TABLET BY MOUTH DAILY IN THE MORNING. NEED APPT FOR REFILLS  . Multiple Vitamin (MULTIVITAMIN WITH MINERALS) TABS tablet Take 1 tablet by mouth daily with lunch.  Marland Kitchen  non-metallic deodorant (ALRA) MISC Apply 1 application topically daily as needed.  . Omega-3 Fatty Acids (FISH OIL) 1000 MG CAPS Take 1,000 mg by mouth daily with lunch.  . Turmeric 450 MG CAPS Take 450 mg by mouth daily with lunch.   . vitamin C (ASCORBIC ACID) 500 MG tablet Take 500 mg by mouth 2 (two) times daily.   No facility-administered encounter medications on file as of 11/08/2020.     ROS: Pertinent positives and negatives noted in HPI. Remainder of ROS non-contributory    No Known Allergies  BP 120/68   Pulse 78   Temp 97.6 F (36.4 C) (Temporal)   Ht 5\' 1"  (1.549 m)   Wt 168 lb (76.2 kg)   SpO2 99%   BMI 31.74 kg/m   Physical Exam Constitutional:      General: She is not in acute distress.    Appearance: Normal appearance. She is not ill-appearing.  HENT:     Right Ear: Tympanic membrane, ear canal and external ear normal.     Left Ear: External ear normal. A middle ear effusion is present. Tympanic membrane is not perforated, erythematous or bulging.     Ears:     Comments: Pt has dry, flaky skin in her Lt ear canal Eyes:     Extraocular Movements:     Right eye: Normal extraocular motion and no nystagmus.     Left eye: Normal extraocular motion and no nystagmus.     Pupils: Pupils are equal, round, and reactive to light.  Cardiovascular:     Rate and Rhythm: Normal rate and regular rhythm.  Pulmonary:     Effort: No respiratory distress.  Musculoskeletal:     Right lower leg: No edema.     Left lower leg: No edema.  Neurological:     General: No focal  deficit present.     Mental Status: She is alert and oriented to person, place, and time.     Cranial Nerves: No facial asymmetry.     Motor: No weakness.     Gait: Gait is intact.  Psychiatric:        Mood and Affect: Mood normal.        Behavior: Behavior normal.      A/P:  1. Hypothyroidism, unspecified type - on levothyroxine 166mcg daily - TSH - T4, free  2. Vitamin D deficiency - takes 1000IU daily - VITAMIN D 25 Hydroxy (Vit-D Deficiency, Fractures)  3. Other fatigue - likely due to stress and poor sleep (caring for husband) but will check B12 level - Vitamin B12  4. BPPV (benign paroxysmal positional vertigo), left - vs labyrinthitis - brandt darhoff exercises reviewed with and given to pt - pt declines Rx for prednisone or medrol dose pack at this time - she will f/u if symptoms worsen or do not continue to improve and resolve in 2 wks  5. Ear canal dryness, left Rx: - Fluocinolone Acetonide 0.01 % OIL 5 gtts in Lt ear daily to BID x 5-7 days then stop    This visit occurred during the SARS-CoV-2 public health emergency.  Safety protocols were in place, including screening questions prior to the visit, additional usage of staff PPE, and extensive cleaning of exam room while observing appropriate contact time as indicated for disinfecting solutions.

## 2020-11-22 ENCOUNTER — Encounter: Payer: Self-pay | Admitting: Family Medicine

## 2020-12-04 ENCOUNTER — Ambulatory Visit: Payer: Medicare Other

## 2020-12-10 NOTE — Progress Notes (Signed)
Subjective:   Shawna Marquez is a 80 y.o. female who presents for Medicare Annual (Subsequent) preventive examination.   Review of Systems     Cardiac Risk Factors include: advanced age (>55men, >44 women);obesity (BMI >30kg/m2)     Objective:    Today's Vitals   12/11/20 0943  BP: 124/68  Pulse: 77  Resp: 16  Temp: (!) 97.2 F (36.2 C)  TempSrc: Temporal  SpO2: 97%  Weight: 171 lb (77.6 kg)  Height: 5\' 1"  (1.549 m)   Body mass index is 32.31 kg/m.  Advanced Directives 12/11/2020 02/09/2019 12/11/2016 08/30/2015 05/31/2015 04/19/2015 03/27/2015  Does Patient Have a Medical Advance Directive? Yes Yes No Yes Yes Yes -  Type of Advance Directive Living will;Healthcare Power of 03/29/2015 Power of [place];Living will - Healthcare Power of [place] Power of Attorney - Healthcare Power of Attorney  Does patient want to make changes to medical advance directive? - No - Patient declined - - - - -  Copy of Healthcare Power of Attorney in Chart? No - copy requested No - copy requested - No - copy requested - - No - copy requested    Current Medications (verified) Outpatient Encounter Medications as of 12/11/2020  Medication Sig  . aspirin EC 81 MG tablet Take 81 mg by mouth at bedtime. Reported on 02/25/2016  . Calcium Citrate-Vitamin D (CALCIUM CITRATE + D3 PO) Take 1 tablet by mouth 2 (two) times daily. Calcium 500 mg, Vitamin D 800 units  . cetirizine (ZYRTEC) 10 MG tablet Take 10 mg by mouth daily.  . cholecalciferol (VITAMIN D) 1000 UNITS tablet Take 1,000 Units by mouth daily with lunch.   . clobetasol cream (TEMOVATE) 0.05 % APPLY TO AFFECTED AREA UP TO 2 TIMES DAILY AS NEEDED.NOT FACE,GROIN OR AXILLA  . Coenzyme Q10 (COQ10) 200 MG CAPS Take 200 mg by mouth every other day.  . exemestane (AROMASIN) 25 MG tablet TAKE 1 TABLET BY MOUTH EVERY DAY AFTER BREAKFAST  . Fluocinolone Acetonide 0.01 % OIL Place 5 drops in ear(s) in the morning and at bedtime.  . fluticasone  (FLONASE) 50 MCG/ACT nasal spray SPRAY 2 SPRAYS INTO EACH NOSTRIL EVERY DAY  . folic acid (FOLVITE) 400 MCG tablet Take 400 mcg by mouth daily with lunch.   . hydrocortisone cream 1 % Apply 1 application topically daily as needed for itching (eczema).  02/27/2016 levothyroxine (SYNTHROID) 125 MCG tablet TAKE 1 TABLET BY MOUTH DAILY IN THE MORNING. NEED APPT FOR REFILLS  . Multiple Vitamin (MULTIVITAMIN WITH MINERALS) TABS tablet Take 1 tablet by mouth daily with lunch.  . non-metallic deodorant Marland Kitchen) MISC Apply 1 application topically daily as needed.  . Omega-3 Fatty Acids (FISH OIL) 1000 MG CAPS Take 1,000 mg by mouth daily with lunch.  . Turmeric 450 MG CAPS Take 450 mg by mouth daily with lunch.   . vitamin B-12 (CYANOCOBALAMIN) 500 MCG tablet Take 500 mcg by mouth daily.  . vitamin C (ASCORBIC ACID) 500 MG tablet Take 500 mg by mouth 2 (two) times daily.   No facility-administered encounter medications on file as of 12/11/2020.    Allergies (verified) Patient has no known allergies.   History: Past Medical History:  Diagnosis Date  . ALLERGIC RHINITIS 10/12/2007  . BREAST CANCER, HX OF 10/12/2007   right breast diagnosed - 11/2014  . Cancer of upper-inner quadrant of female breast ([facility]) 11/17/2014  . Complication of anesthesia    bp will drop,hard to wake up  . DIVERTICULITIS,  HX OF 10/12/2007  . Family history of breast cancer   . HYPOTHYROIDISM, PRIMARY 10/12/2007  . OSTEOARTHRITIS 09/09/2007   back   . Radiation 04/10/15-05/25/15   Right Breast  . Sleep apnea    USES cpap NIGHTLY  . Wears glasses    Past Surgical History:  Procedure Laterality Date  . ABDOMINAL HYSTERECTOMY  1977  . COLON SURGERY  2006   part colectomy  . COLONOSCOPY  2007  . colovaginal fistula  2007   colon resection  . EYE SURGERY     both cataracts  . HERNIA REPAIR  2007   ventral  . MASTECTOMY Left 1997   lt mast-axillary node dissection  . PORTACATH PLACEMENT Left 01/01/2015   Procedure: INSERTION  PORT-A-CATH;  Surgeon: Rolm Bookbinder, MD;  Location: [facility];  Service: General;  Laterality: Left;  . RADIOACTIVE SEED GUIDED PARTIAL MASTECTOMY WITH AXILLARY SENTINEL LYMPH NODE BIOPSY Right 11/28/2014   Procedure: RADIOACTIVE SEED GUIDED RIGHT LUMPECTOMY WITH RIGHT AXILLARY SENTINEL LYMPH NODE BIOPSY;  Surgeon: Rolm Bookbinder, MD;  Location: [facility];  Service: General;  Laterality: Right;  . RE-EXCISION OF BREAST CANCER,SUPERIOR MARGINS Right 01/01/2015   Procedure: RIGHT BREAST MARGIN EXCISION;  Surgeon: Rolm Bookbinder, MD;  Location: [facility];  Service: General;  Laterality: Right;  . TONSILLECTOMY    . WISDOM TOOTH EXTRACTION     Family History  Problem Relation Age of Onset  . Breast cancer Paternal Grandmother 17  . Lung cancer Father 57       smoker  . Prostate cancer Cousin        paternal cousin   . Heart disease Neg Hx        family hx  . Cancer Neg Hx        lung ca  . COPD Neg Hx        family hx  . Colon cancer Neg Hx    Social History   Socioeconomic History  . Marital status: Married    Spouse name: Not on file  . Number of children: 3  . Years of education: Not on file  . Highest education level: Not on file  Occupational History  . Occupation: retired  Tobacco Use  . Smoking status: Never Smoker  . Smokeless tobacco: Never Used  Substance and Sexual Activity  . Alcohol use: Yes    Alcohol/week: 0.0 standard drinks    Comment: occassional beer  . Drug use: No  . Sexual activity: Yes  Other Topics Concern  . Not on file  Social History Narrative  . Not on file   Social Determinants of Health   Financial Resource Strain: Low Risk   . Difficulty of Paying Living Expenses: Not hard at all  Food Insecurity: No Food Insecurity  . Worried About Charity fundraiser in the Last Year: Never true  . Ran Out of Food in the Last Year: Never true  Transportation Needs: No Transportation Needs  . Lack of Transportation (Medical): No  .  Lack of Transportation (Non-Medical): No  Physical Activity: Sufficiently Active  . Days of Exercise per Week: 4 days  . Minutes of Exercise per Session: 60 min  Stress: No Stress Concern Present  . Feeling of Stress : Not at all  Social Connections: Socially Integrated  . Frequency of Communication with Friends and Family: More than three times a week  . Frequency of Social Gatherings with Friends and Family: More than three times a week  . Attends  Religious Services: 1 to 4 times per year  . Active Member of Clubs or Organizations: Yes  . Attends Banker Meetings: 1 to 4 times per year  . Marital Status: Married    Tobacco Counseling Counseling given: Not Answered   Clinical Intake:  Pre-visit preparation completed: Yes  Pain : No/denies pain     Nutritional Status: BMI > 30  Obese Nutritional Risks: None Diabetes: No  How often do you need to have someone help you when you read instructions, pamphlets, or other written materials from your doctor or pharmacy?: 1 - Never  Diabetic?No  Interpreter Needed?: No  Information entered by :: Thomasenia Sales LPN   Activities of Daily Living In your present state of health, do you have any difficulty performing the following activities: 12/11/2020  Hearing? N  Vision? N  Difficulty concentrating or making decisions? N  Walking or climbing stairs? N  Dressing or bathing? N  Doing errands, shopping? N  Preparing Food and eating ? N  Using the Toilet? N  In the past six months, have you accidently leaked urine? N  Do you have problems with loss of bowel control? N  Managing your Medications? N  Managing your Finances? N  Housekeeping or managing your Housekeeping? N  Some recent data might be hidden    Patient Care Team: Overton Mam, DO as PCP - General (Family Medicine) Emelia Loron, MD as Consulting Physician (General Surgery) Serena Croissant, MD as Consulting Physician (Hematology and  Oncology) Lurline Hare, MD as Consulting Physician (Radiation Oncology) Hubbard Hartshorn, NP (Inactive) as Nurse Practitioner (Nurse Practitioner) Salomon Fick, NP as Nurse Practitioner (Nurse Practitioner)  Indicate any recent Medical Services you may have received from other than [facility] providers in the past year (date may be approximate).     Assessment:   This is a routine wellness examination for [place].  Hearing/Vision screen  Hearing Screening   125Hz  250Hz  500Hz  1000Hz  2000Hz  3000Hz  4000Hz  6000Hz  8000Hz   Right ear:           Left ear:           Comments: No issues  Vision Screening Comments: Wears glasses Last eye exam-06/2020-Dr. Tanner  Dietary issues and exercise activities discussed: Current Exercise Habits: Home exercise routine, Type of exercise: walking, Time (Minutes): 60, Frequency (Times/Week): 4, Weekly Exercise (Minutes/Week): 240, Intensity: Mild, Exercise limited by: None identified  Goals    . Maintain current healthy lifestyle.      Depression Screen PHQ 2/9 Scores 12/11/2020 05/23/2020 02/09/2019 01/27/2018 01/15/2016 02/24/2014  PHQ - 2 Score 0 0 0 0 0 0    Fall Risk Fall Risk  12/11/2020 05/23/2020 02/09/2019 01/27/2018 01/15/2016  Falls in the past year? 1 0 0 No No  Number falls in past yr: 0 - - - -  Injury with Fall? 0 - - - -  Follow up Falls prevention discussed - - - -    FALL RISK PREVENTION PERTAINING TO THE HOME:  Any stairs in or around the home? Yes  If so, are there any without handrails? No  Home free of loose throw rugs in walkways, pet beds, electrical cords, etc? Yes  Adequate lighting in your home to reduce risk of falls? Yes   ASSISTIVE DEVICES UTILIZED TO PREVENT FALLS:  Life alert? No  Use of a cane, walker or w/c? No  Grab bars in the bathroom? Yes  Shower chair or bench in shower? Yes  Elevated toilet seat  or a handicapped toilet? No   TIMED UP AND GO:  Was the test performed? Yes .  Length of time to ambulate 10  feet: 9 sec.   Gait steady and fast without use of assistive device  Cognitive Function:Normal cognitive status assessed by direct observation by this Nurse Health Advisor. No abnormalities found.       6CIT Screen 12/11/2020  What Year? 0 points  What month? 0 points  What time? 0 points  Count back from 20 0 points  Months in reverse 0 points  Repeat phrase 0 points  Total Score 0    Immunizations Immunization History  Administered Date(s) Administered  . Fluad Quad(high Dose 65+) 10/18/2019, 10/24/2020  . Influenza Split 10/10/2013  . Influenza Whole 09/07/2000  . Influenza, High Dose Seasonal PF 10/27/2014, 09/27/2015, 10/09/2016, 09/24/2017  . Influenza-Unspecified 10/15/2018  . PFIZER SARS-COV-2 Vaccination 01/28/2020, 02/21/2020, 10/03/2020  . Pneumococcal Conjugate-13 02/24/2014  . Pneumococcal Polysaccharide-23 02/06/2004  . Td 03/18/1996  . Tdap 02/08/2013    TDAP status: Up to date  Flu Vaccine status: Up to date  Pneumococcal vaccine status: Up to date  Covid-19 vaccine status: Completed vaccines  Qualifies for Shingles Vaccine? Yes   Zostavax completed No   Shingrix Completed?: No.    Education has been provided regarding the importance of this vaccine. Patient has been advised to call insurance company to determine out of pocket expense if they have not yet received this vaccine. Advised may also receive vaccine at local pharmacy or Health Dept. Verbalized acceptance and understanding.  Screening Tests Health Maintenance  Topic Date Due  . COVID-19 Vaccine (4 - Booster for Pfizer series) 04/03/2021  . MAMMOGRAM  07/05/2021  . TETANUS/TDAP  02/09/2023  . INFLUENZA VACCINE  Completed  . DEXA SCAN  Completed  . PNA vac Low Risk Adult  Completed    Health Maintenance  There are no preventive care reminders to display for this patient.  Colorectal cancer screening: No longer required.   Mammogram status: Completed Unilateral-right-07/05/2020. Repeat  every year  Bone Density status: Due-Declined today.  Lung Cancer Screening: (Low Dose CT Chest recommended if Age 35-80 years, 30 pack-year currently smoking OR have quit w/in 15years.) does not qualify.    Additional Screening:  Hepatitis C Screening: does not qualify  Vision Screening: Recommended annual ophthalmology exams for early detection of glaucoma and other disorders of the eye. Is the patient up to date with their annual eye exam?  Yes  Who is the provider or what is the name of the office in which the patient attends annual eye exams? Dr. Burgess Estelle   Dental Screening: Recommended annual dental exams for proper oral hygiene  Community Resource Referral / Chronic Care Management: CRR required this visit?  No   CCM required this visit?  No      Plan:     I have personally reviewed and noted the following in the patient's chart:   . Medical and social history . Use of alcohol, tobacco or illicit drugs  . Current medications and supplements . Functional ability and status . Nutritional status . Physical activity . Advanced directives . List of other physicians . Hospitalizations, surgeries, and ER visits in previous 12 months . Vitals . Screenings to include cognitive, depression, and falls . Referrals and appointments  In addition, I have reviewed and discussed with patient certain preventive protocols, quality metrics, and best practice recommendations. A written personalized care plan for preventive services as well as general preventive  health recommendations were provided to patient.   Patient would like to access AVS on mychart.  Roanna Raider, LPN   07/16/3809  Nurse Health Advisor  Nurse Notes: None

## 2020-12-11 ENCOUNTER — Other Ambulatory Visit: Payer: Self-pay

## 2020-12-11 ENCOUNTER — Ambulatory Visit (INDEPENDENT_AMBULATORY_CARE_PROVIDER_SITE_OTHER): Payer: Medicare Other

## 2020-12-11 VITALS — BP 124/68 | HR 77 | Temp 97.2°F | Resp 16 | Ht 61.0 in | Wt 171.0 lb

## 2020-12-11 DIAGNOSIS — Z0000 Encounter for general adult medical examination without abnormal findings: Secondary | ICD-10-CM

## 2020-12-11 NOTE — Patient Instructions (Signed)
Shawna Marquez , Thank you for taking time to come for your Medicare Wellness Visit. I appreciate your ongoing commitment to your health goals. Please review the following plan we discussed and let me know if I can assist you in the future.   Screening recommendations/referrals: Colonoscopy: No longer required Mammogram: Completed 07/05/2020-Due-07/05/2021 Bone Density: Due. Declined today. Please call the office to schedule if you change your mind. Recommended yearly ophthalmology/optometry visit for glaucoma screening and checkup Recommended yearly dental visit for hygiene and checkup  Vaccinations: Influenza vaccine: Up to date Pneumococcal vaccine: Completed vaccines Tdap vaccine: Up to date-Due-3/4/202 Shingles vaccine: Discuss with pharmacy   Covid-19:Completed vaccines  Advanced directives: Please bring a copy for your chart  Conditions/risks identified: See problem list  Next appointment: Follow up in one year for your annual wellness visit 12/17/2021 10:30   Preventive Care 82 Years and Older, Female Preventive care refers to lifestyle choices and visits with your health care provider that can promote health and wellness. What does preventive care include?  A yearly physical exam. This is also called an annual well check.  Dental exams once or twice a year.  Routine eye exams. Ask your health care provider how often you should have your eyes checked.  Personal lifestyle choices, including:  Daily care of your teeth and gums.  Regular physical activity.  Eating a healthy diet.  Avoiding tobacco and drug use.  Limiting alcohol use.  Practicing safe sex.  Taking low-dose aspirin every day.  Taking vitamin and mineral supplements as recommended by your health care provider. What happens during an annual well check? The services and screenings done by your health care provider during your annual well check will depend on your age, overall health, lifestyle risk factors,  and family history of disease. Counseling  Your health care provider may ask you questions about your:  Alcohol use.  Tobacco use.  Drug use.  Emotional well-being.  Home and relationship well-being.  Sexual activity.  Eating habits.  History of falls.  Memory and ability to understand (cognition).  Work and work Astronomer.  Reproductive health. Screening  You may have the following tests or measurements:  Height, weight, and BMI.  Blood pressure.  Lipid and cholesterol levels. These may be checked every 5 years, or more frequently if you are over 82 years old.  Skin check.  Lung cancer screening. You may have this screening every year starting at age 82 if you have a 30-pack-year history of smoking and currently smoke or have quit within the past 15 years.  Fecal occult blood test (FOBT) of the stool. You may have this test every year starting at age 82.  Flexible sigmoidoscopy or colonoscopy. You may have a sigmoidoscopy every 5 years or a colonoscopy every 10 years starting at age 82.  Hepatitis C blood test.  Hepatitis B blood test.  Sexually transmitted disease (STD) testing.  Diabetes screening. This is done by checking your blood sugar (glucose) after you have not eaten for a while (fasting). You may have this done every 1-3 years.  Bone density scan. This is done to screen for osteoporosis. You may have this done starting at age 82.  Mammogram. This may be done every 1-2 years. Talk to your health care provider about how often you should have regular mammograms. Talk with your health care provider about your test results, treatment options, and if necessary, the need for more tests. Vaccines  Your health care provider may recommend certain vaccines, such  as:  Influenza vaccine. This is recommended every year.  Tetanus, diphtheria, and acellular pertussis (Tdap, Td) vaccine. You may need a Td booster every 10 years.  Zoster vaccine. You may need  this after age 82.  Pneumococcal 13-valent conjugate (PCV13) vaccine. One dose is recommended after age 82.  Pneumococcal polysaccharide (PPSV23) vaccine. One dose is recommended after age 82. Talk to your health care provider about which screenings and vaccines you need and how often you need them. This information is not intended to replace advice given to you by your health care provider. Make sure you discuss any questions you have with your health care provider. Document Released: 12/21/2015 Document Revised: 08/13/2016 Document Reviewed: 09/25/2015 Elsevier Interactive Patient Education  2017 [employer] Prevention in the Home Falls can cause injuries. They can happen to people of all ages. There are many things you can do to make your home safe and to help prevent falls. What can I do on the outside of my home?  Regularly fix the edges of walkways and driveways and fix any cracks.  Remove anything that might make you trip as you walk through a door, such as a raised step or threshold.  Trim any bushes or trees on the path to your home.  Use bright outdoor lighting.  Clear any walking paths of anything that might make someone trip, such as rocks or tools.  Regularly check to see if handrails are loose or broken. Make sure that both sides of any steps have handrails.  Any raised decks and porches should have guardrails on the edges.  Have any leaves, snow, or ice cleared regularly.  Use sand or salt on walking paths during winter.  Clean up any spills in your garage right away. This includes oil or grease spills. What can I do in the bathroom?  Use night lights.  Install grab bars by the toilet and in the tub and shower. Do not use towel bars as grab bars.  Use non-skid mats or decals in the tub or shower.  If you need to sit down in the shower, use a plastic, non-slip stool.  Keep the floor dry. Clean up any water that spills on the floor as soon as it  happens.  Remove soap buildup in the tub or shower regularly.  Attach bath mats securely with double-sided non-slip rug tape.  Do not have throw rugs and other things on the floor that can make you trip. What can I do in the bedroom?  Use night lights.  Make sure that you have a light by your bed that is easy to reach.  Do not use any sheets or blankets that are too big for your bed. They should not hang down onto the floor.  Have a firm chair that has side arms. You can use this for support while you get dressed.  Do not have throw rugs and other things on the floor that can make you trip. What can I do in the kitchen?  Clean up any spills right away.  Avoid walking on wet floors.  Keep items that you use a lot in easy-to-reach places.  If you need to reach something above you, use a strong step stool that has a grab bar.  Keep electrical cords out of the way.  Do not use floor polish or wax that makes floors slippery. If you must use wax, use non-skid floor wax.  Do not have throw rugs and other things on the  floor that can make you trip. What can I do with my stairs?  Do not leave any items on the stairs.  Make sure that there are handrails on both sides of the stairs and use them. Fix handrails that are broken or loose. Make sure that handrails are as long as the stairways.  Check any carpeting to make sure that it is firmly attached to the stairs. Fix any carpet that is loose or worn.  Avoid having throw rugs at the top or bottom of the stairs. If you do have throw rugs, attach them to the floor with carpet tape.  Make sure that you have a light switch at the top of the stairs and the bottom of the stairs. If you do not have them, ask someone to add them for you. What else can I do to help prevent falls?  Wear shoes that:  Do not have high heels.  Have rubber bottoms.  Are comfortable and fit you well.  Are closed at the toe. Do not wear sandals.  If you  use a stepladder:  Make sure that it is fully opened. Do not climb a closed stepladder.  Make sure that both sides of the stepladder are locked into place.  Ask someone to hold it for you, if possible.  Clearly mark and make sure that you can see:  Any grab bars or handrails.  First and last steps.  Where the edge of each step is.  Use tools that help you move around (mobility aids) if they are needed. These include:  Canes.  Walkers.  Scooters.  Crutches.  Turn on the lights when you go into a dark area. Replace any light bulbs as soon as they burn out.  Set up your furniture so you have a clear path. Avoid moving your furniture around.  If any of your floors are uneven, fix them.  If there are any pets around you, be aware of where they are.  Review your medicines with your doctor. Some medicines can make you feel dizzy. This can increase your chance of falling. Ask your doctor what other things that you can do to help prevent falls. This information is not intended to replace advice given to you by your health care provider. Make sure you discuss any questions you have with your health care provider. Document Released: 09/20/2009 Document Revised: 05/01/2016 Document Reviewed: 12/29/2014 Elsevier Interactive Patient Education  2017 [employer].

## 2021-01-27 ENCOUNTER — Other Ambulatory Visit: Payer: Self-pay | Admitting: Family Medicine

## 2021-01-27 DIAGNOSIS — E039 Hypothyroidism, unspecified: Secondary | ICD-10-CM

## 2021-01-28 NOTE — Telephone Encounter (Signed)
Chart supports Rx 

## 2021-02-19 DIAGNOSIS — L820 Inflamed seborrheic keratosis: Secondary | ICD-10-CM | POA: Diagnosis not present

## 2021-02-19 DIAGNOSIS — D225 Melanocytic nevi of trunk: Secondary | ICD-10-CM | POA: Diagnosis not present

## 2021-02-19 DIAGNOSIS — D485 Neoplasm of uncertain behavior of skin: Secondary | ICD-10-CM | POA: Diagnosis not present

## 2021-04-27 ENCOUNTER — Other Ambulatory Visit: Payer: Self-pay | Admitting: Family Medicine

## 2021-04-27 DIAGNOSIS — E039 Hypothyroidism, unspecified: Secondary | ICD-10-CM

## 2021-04-29 ENCOUNTER — Other Ambulatory Visit: Payer: Self-pay

## 2021-04-29 NOTE — Telephone Encounter (Signed)
Appointment is needed in order to refill medications, please call patient to schedule an appointment.

## 2021-05-01 ENCOUNTER — Ambulatory Visit: Payer: Medicare Other | Admitting: Family Medicine

## 2021-05-09 ENCOUNTER — Other Ambulatory Visit: Payer: Self-pay

## 2021-05-10 ENCOUNTER — Encounter: Payer: Self-pay | Admitting: Family Medicine

## 2021-05-10 ENCOUNTER — Ambulatory Visit (INDEPENDENT_AMBULATORY_CARE_PROVIDER_SITE_OTHER): Payer: Medicare Other | Admitting: Family Medicine

## 2021-05-10 VITALS — BP 120/72 | HR 80 | Temp 97.9°F | Ht 61.0 in | Wt 166.6 lb

## 2021-05-10 DIAGNOSIS — H6122 Impacted cerumen, left ear: Secondary | ICD-10-CM

## 2021-05-10 DIAGNOSIS — E785 Hyperlipidemia, unspecified: Secondary | ICD-10-CM | POA: Diagnosis not present

## 2021-05-10 DIAGNOSIS — E039 Hypothyroidism, unspecified: Secondary | ICD-10-CM

## 2021-05-10 DIAGNOSIS — E559 Vitamin D deficiency, unspecified: Secondary | ICD-10-CM

## 2021-05-10 MED ORDER — LEVOTHYROXINE SODIUM 125 MCG PO TABS: 125.0000 ug | ORAL_TABLET | Freq: Every day | ORAL | 3 refills | Status: DC

## 2021-05-10 NOTE — Progress Notes (Signed)
Shawna Marquez is a 80 y.o. female  Chief Complaint  Patient presents with  . Follow-up    F/u meds. C/o having some itching/crackling in LT ear.      HPI: Shawna Marquez is a 80 y.o. female patient seen today for routine f/u on hyperlipidemia, hypothyroidism, Vit D deficiency and medication refills.     Pt also complains of itching and "crackling" in her Lt ear.  Past Medical History:  Diagnosis Date  . ALLERGIC RHINITIS 10/12/2007  . BREAST CANCER, HX OF 10/12/2007   right breast diagnosed - 11/2014  . 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 Complication of anesthesia    bp will drop,hard to wake up  . DIVERTICULITIS, HX OF 10/12/2007  . Family history of breast cancer   . HYPOTHYROIDISM, PRIMARY 10/12/2007  . OSTEOARTHRITIS 09/09/2007   back   . Radiation 04/10/15-05/25/15   Right Breast  . Sleep apnea    USES cpap NIGHTLY  . Wears glasses     Past Surgical History:  Procedure Laterality Date  . ABDOMINAL HYSTERECTOMY  1977  . COLON SURGERY  2006   part colectomy  . COLONOSCOPY  2007  . colovaginal fistula  2007   colon resection  . EYE SURGERY     both cataracts  . HERNIA REPAIR  2007   ventral  . MASTECTOMY Left 1997   lt mast-axillary node dissection  . PORTACATH PLACEMENT Left 01/01/2015   Procedure: INSERTION PORT-A-CATH;  Surgeon: Rolm Bookbinder, MD;  Location: [place] [place];  Service: General;  Laterality: Left;  . RADIOACTIVE SEED GUIDED PARTIAL MASTECTOMY WITH AXILLARY SENTINEL LYMPH NODE BIOPSY Right 11/28/2014   Procedure: RADIOACTIVE SEED GUIDED RIGHT LUMPECTOMY WITH RIGHT AXILLARY SENTINEL LYMPH NODE BIOPSY;  Surgeon: Rolm Bookbinder, MD;  Location: [facility];  Service: General;  Laterality: Right;  . RE-EXCISION OF BREAST CANCER,SUPERIOR MARGINS Right 01/01/2015   Procedure: RIGHT BREAST MARGIN EXCISION;  Surgeon: Rolm Bookbinder, MD;  Location: [place];  Service: General;  Laterality: Right;  . TONSILLECTOMY    .  WISDOM TOOTH EXTRACTION      Social History   Socioeconomic History  . Marital status: Married    Spouse name: Not on file  . Number of children: 3  . Years of education: Not on file  . Highest education level: Not on file  Occupational History  . Occupation: retired  Tobacco Use  . Smoking status: Never Smoker  . Smokeless tobacco: Never Used  Substance and Sexual Activity  . Alcohol use: Yes    Alcohol/week: 0.0 standard drinks    Comment: occassional beer  . Drug use: No  . Sexual activity: Yes  Other Topics Concern  . Not on file  Social History Narrative  . Not on file   Social Determinants of Health   Financial Resource Strain: Low Risk   . Difficulty of Paying Living Expenses: Not hard at all  Food Insecurity: No Food Insecurity  . Worried About Charity fundraiser in the Last Year: Never true  . Ran Out of Food in the Last Year: Never true  Transportation Needs: No Transportation Needs  . Lack of Transportation (Medical): No  . Lack of Transportation (Non-Medical): No  Physical Activity: Sufficiently Active  . Days of Exercise per Week: 4 days  . Minutes of Exercise per Session: 60 min  Stress: No Stress Concern Present  . Feeling of Stress : Not at all  Social Connections: Socially  Integrated  . Frequency of Communication with Friends and Family: More than three times a week  . Frequency of Social Gatherings with Friends and Family: More than three times a week  . Attends Religious Services: 1 to 4 times per year  . Active Member of Clubs or Organizations: Yes  . Attends Archivist Meetings: 1 to 4 times per year  . Marital Status: Married  Human resources officer Violence: Not At Risk  . Fear of Current or Ex-Partner: No  . Emotionally Abused: No  . Physically Abused: No  . Sexually Abused: No    Family History  Problem Relation Age of Onset  . Breast cancer Paternal Grandmother 89  . Lung cancer Father 15       smoker  . Prostate cancer  Cousin        paternal cousin   . Heart disease Neg Hx        family hx  . Cancer Neg Hx        lung ca  . COPD Neg Hx        family hx  . Colon cancer Neg Hx      Immunization History  Administered Date(s) Administered  . Fluad Quad(high Dose 65+) 10/18/2019, 10/24/2020  . Influenza Split 10/10/2013  . Influenza Whole 09/07/2000  . Influenza, High Dose Seasonal PF 10/27/2014, 09/27/2015, 10/09/2016, 09/24/2017  . Influenza-Unspecified 10/15/2018  . PFIZER(Purple Top)SARS-COV-2 Vaccination 01/28/2020, 02/21/2020, 10/03/2020  . Pneumococcal Conjugate-13 02/24/2014  . Pneumococcal Polysaccharide-23 02/06/2004  . Td 03/18/1996  . Tdap 02/08/2013    Outpatient Encounter Medications as of 05/10/2021  Medication Sig  . aspirin EC 81 MG tablet Take 81 mg by mouth at bedtime. Reported on 02/25/2016  . Calcium Citrate-Vitamin D (CALCIUM CITRATE + D3 PO) Take 1 tablet by mouth 2 (two) times daily. Calcium 500 mg, Vitamin D 800 units  . cetirizine (ZYRTEC) 10 MG tablet Take 10 mg by mouth daily.  . cholecalciferol (VITAMIN D) 1000 UNITS tablet Take 1,000 Units by mouth daily with lunch.   . clobetasol cream (TEMOVATE) 0.05 % APPLY TO AFFECTED AREA UP TO 2 TIMES DAILY AS NEEDED.NOT FACE,GROIN OR AXILLA  . Coenzyme Q10 (COQ10) 200 MG CAPS Take 200 mg by mouth every other day.  . exemestane (AROMASIN) 25 MG tablet TAKE 1 TABLET BY MOUTH EVERY DAY AFTER BREAKFAST  . Fluocinolone Acetonide 0.01 % OIL Place 5 drops in ear(s) in the morning and at bedtime.  . fluticasone (FLONASE) 50 MCG/ACT nasal spray SPRAY 2 SPRAYS INTO EACH NOSTRIL EVERY DAY  . folic acid (FOLVITE) 644 MCG tablet Take 400 mcg by mouth daily with lunch.   . hydrocortisone cream 1 % Apply 1 application topically daily as needed for itching (eczema).  Marland Kitchen levothyroxine (SYNTHROID) 125 MCG tablet TAKE 1 TABLET BY MOUTH DAILY IN THE MORNING. NEED APPT FOR REFILLS  . Multiple Vitamin (MULTIVITAMIN WITH MINERALS) TABS tablet Take 1  tablet by mouth daily with lunch.  . Omega-3 Fatty Acids (FISH OIL) 1000 MG CAPS Take 1,000 mg by mouth daily with lunch.  . Turmeric 450 MG CAPS Take 450 mg by mouth daily with lunch.   . vitamin B-12 (CYANOCOBALAMIN) 500 MCG tablet Take 500 mcg by mouth daily.  . vitamin C (ASCORBIC ACID) 500 MG tablet Take 500 mg by mouth 2 (two) times daily.  . [DISCONTINUED] non-metallic deodorant Jethro Poling) MISC Apply 1 application topically daily as needed.   No facility-administered encounter medications on file as of 05/10/2021.  ROS: Pertinent positives and negatives noted in HPI. Remainder of ROS non-contributory   No Known Allergies  BP 120/72   Pulse 80   Temp 97.9 F (36.6 C) (Temporal)   Ht 5\' 1"  (1.549 m)   Wt 166 lb 9.6 oz (75.6 kg)   SpO2 98%   BMI 31.48 kg/m   Wt Readings from Last 3 Encounters:  05/10/21 166 lb 9.6 oz (75.6 kg)  12/11/20 171 lb (77.6 kg)  11/08/20 168 lb (76.2 kg)   Temp Readings from Last 3 Encounters:  05/10/21 97.9 F (36.6 C) (Temporal)  12/11/20 (!) 97.2 F (36.2 C) (Temporal)  11/08/20 97.6 F (36.4 C) (Temporal)   BP Readings from Last 3 Encounters:  05/10/21 120/72  12/11/20 124/68  11/08/20 120/68   Pulse Readings from Last 3 Encounters:  05/10/21 80  12/11/20 77  11/08/20 78     Physical Exam Constitutional:      General: She is not in acute distress.    Appearance: She is not ill-appearing.  HENT:     Right Ear: External ear normal. No tenderness. No middle ear effusion. There is no impacted cerumen. Tympanic membrane is not erythematous or bulging.     Left Ear: External ear normal. There is impacted cerumen.  Cardiovascular:     Rate and Rhythm: Normal rate and regular rhythm.     Pulses: Normal pulses.  Pulmonary:     Effort: Pulmonary effort is normal.     Breath sounds: Normal breath sounds. No wheezing or rhonchi.  Musculoskeletal:     Right lower leg: No edema.     Left lower leg: No edema.  Neurological:      Mental Status: She is alert and oriented to person, place, and time.  Psychiatric:        Mood and Affect: Mood normal.        Behavior: Behavior normal.      A/P:  1. Dyslipidemia - not on statin, takes omega 3 fish oil 1000mg  daily - Basic metabolic panel - Lipid panel - AST - ALT  2. Hypothyroidism, unspecified type - last TFTs WNL in 11/2020 Refill: - levothyroxine (SYNTHROID) 125 MCG tablet; Take 1 tablet (125 mcg total) by mouth daily before breakfast.  Dispense: 90 tablet; Refill: 3  3. Vitamin D deficiency - taking 1000IU daily - VITAMIN D 25 Hydroxy (Vit-D Deficiency, Fractures)  4. Left ear impacted cerumen - Ear Lavage - informed consent obtained and Lt ear irrigated with mix of water and hydrogen peroxide. Cerumen removed. Pt tolerated without issue   This visit occurred during the SARS-CoV-2 public health emergency.  Safety protocols were in place, including screening questions prior to the visit, additional usage of staff PPE, and extensive cleaning of exam room while observing appropriate contact time as indicated for disinfecting solutions.

## 2021-05-14 ENCOUNTER — Other Ambulatory Visit (INDEPENDENT_AMBULATORY_CARE_PROVIDER_SITE_OTHER): Payer: Medicare Other

## 2021-05-14 DIAGNOSIS — E785 Hyperlipidemia, unspecified: Secondary | ICD-10-CM

## 2021-05-14 DIAGNOSIS — E559 Vitamin D deficiency, unspecified: Secondary | ICD-10-CM

## 2021-05-14 LAB — BASIC METABOLIC PANEL
BUN: 25 mg/dL — ABNORMAL HIGH (ref 6–23)
CO2: 26 mEq/L (ref 19–32)
Calcium: 9.2 mg/dL (ref 8.4–10.5)
Chloride: 102 mEq/L (ref 96–112)
Creatinine, Ser: 0.72 mg/dL (ref 0.40–1.20)
GFR: 77.94 mL/min (ref >60.00)
Glucose, Bld: 90 mg/dL (ref 70–99)
Potassium: 5 mEq/L (ref 3.5–5.1)
Sodium: 138 mEq/L (ref 135–145)

## 2021-05-14 LAB — LIPID PANEL
Cholesterol: 162 mg/dL (ref 0–200)
HDL: 52.1 mg/dL (ref >39.00)
LDL Cholesterol: 92 mg/dL (ref 0–99)
NonHDL: 110.14
Total CHOL/HDL Ratio: 3
Triglycerides: 90 mg/dL (ref 0.0–149.0)
VLDL: 18 mg/dL (ref 0.0–40.0)

## 2021-05-14 LAB — VITAMIN D 25 HYDROXY (VIT D DEFICIENCY, FRACTURES): VITD: 33.2 ng/mL (ref 30.00–100.00)

## 2021-05-14 LAB — AST: AST: 17 U/L (ref 0–37)

## 2021-05-14 LAB — ALT: ALT: 14 U/L (ref 0–35)

## 2021-05-20 DIAGNOSIS — Z23 Encounter for immunization: Secondary | ICD-10-CM | POA: Diagnosis not present

## 2021-05-28 DIAGNOSIS — D225 Melanocytic nevi of trunk: Secondary | ICD-10-CM | POA: Diagnosis not present

## 2021-05-28 DIAGNOSIS — Z1283 Encounter for screening for malignant neoplasm of skin: Secondary | ICD-10-CM | POA: Diagnosis not present

## 2021-05-28 DIAGNOSIS — L820 Inflamed seborrheic keratosis: Secondary | ICD-10-CM | POA: Diagnosis not present

## 2021-06-07 DIAGNOSIS — Z20822 Contact with and (suspected) exposure to covid-19: Secondary | ICD-10-CM | POA: Diagnosis not present

## 2021-06-17 ENCOUNTER — Other Ambulatory Visit: Payer: Self-pay | Admitting: Family Medicine

## 2021-06-17 DIAGNOSIS — J301 Allergic rhinitis due to pollen: Secondary | ICD-10-CM

## 2021-06-17 NOTE — Telephone Encounter (Signed)
Last OV 05/10/21 Last fill 06/13/20  #3 96ml

## 2021-06-19 DIAGNOSIS — Z20822 Contact with and (suspected) exposure to covid-19: Secondary | ICD-10-CM | POA: Diagnosis not present

## 2021-06-26 NOTE — Progress Notes (Signed)
Patient Care Team: Ronnald Nian, DO as PCP - General (Family Medicine) Rolm Bookbinder, MD as Consulting Physician (General Surgery) Nicholas Lose, MD as Consulting Physician (Hematology and Oncology) Thea Silversmith, MD as Consulting Physician (Radiation Oncology) Holley Bouche, NP (Inactive) as Nurse Practitioner (Nurse Practitioner) Sylvan Cheese, NP as Nurse Practitioner (Nurse Practitioner)  DIAGNOSIS:    ICD-10-CM   1. 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 breast ([place])  C50.211       SUMMARY OF ONCOLOGIC HISTORY: Oncology History  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 breast ([place])  06/07/1996 Miscellaneous   History of Stage IIB left breast cancer S/P mastectomy and chemotherapy (Adriamycin and Cytoxan) on CALGB 9397.    11/10/2014 Mammogram   Right breast: 9 mm mass     11/13/2014 Breast US   Right breast: 9 mm mass at 1:00 position with indistinct margin, middle depth, hypoechoic.    11/15/2014 Initial Biopsy   Right breast needle biopsy: Invasive ductal carcinoma, grade 2, with DCIS with lymphovascular invasion ER+ (100%), PR+ (100%), HER-2/neu negative (ratio 1.87), Ki-67 23%    11/15/2014 Clinical Stage   Stage IA; T1b N0    11/28/2014 Definitive Surgery   Right breast lumpectomy with SLNB Donne Hazel): Invasive ductal carcinoma, grade 3, 1.7 cm, with DCIS, focal vascular involvement, posterior margin involvement, 1/2 sentinel nodes positive for malignancy, repeat HER2/neu negative (ratio 1.14)    11/28/2014 Procedure   Mammaprint: High Risk Luminal Type B.     11/28/2014 Pathologic Stage   Stage IIA: pT1c, pN1a    01/01/2015 Surgery   Rexcision of right breast margin: surgical margins negative for malignancy    01/15/2015 - 03/20/2015 Chemotherapy   Adjuvant chemotherapy with Taxotere and Cyclophosphamide X 4 cycles (Laymond Postle)    04/10/2015 - 05/25/2015 Radiation Therapy   Adjuvant XRT completed  Pablo Ledger): Right breast 45 Gy over 25 fractions; Right breast boost 16 Gy at 2 Gy over 8 fractions. Total dose 61 Gy    05/31/2015 -  Anti-estrogen oral therapy   Anastrozole 1 mg daily stopped at 04/03/2016 due to tendinitis switched to Femara 04/21/2016 discontinued June 2017, starting exemestane 09/11/2016   08/03/2015 Survivorship   Survivorship visit completed and copy of survivorship care plan provided to patient.      CHIEF COMPLIANT: Follow-up of recurrent right breast cancer on exemestane  INTERVAL HISTORY: Shawna Marquez is a 80 y.o. with above-mentioned history of recurrent right breast cancer who is currently on anti-estrogen therapy with exemestane. Mammogram on 07/02/20 showed no evidence for malignancy. She presents to the clinic today for annual follow-up.  She does water walks every other day for an hour  ALLERGIES:  has No Known Allergies.  MEDICATIONS:  Current Outpatient Medications  Medication Sig Dispense Refill   aspirin EC 81 MG tablet Take 81 mg by mouth at bedtime. Reported on 02/25/2016     Calcium Citrate-Vitamin D (CALCIUM CITRATE + D3 PO) Take 1 tablet by mouth 2 (two) times daily. Calcium 500 mg, Vitamin D 800 units     cetirizine (ZYRTEC) 10 MG tablet Take 10 mg by mouth daily.     cholecalciferol (VITAMIN D) 1000 UNITS tablet Take 1,000 Units by mouth daily with lunch.      clobetasol cream (TEMOVATE) 0.05 % APPLY TO AFFECTED AREA UP TO 2 TIMES DAILY AS NEEDED.NOT FACE,GROIN OR AXILLA  3   Coenzyme Q10 (COQ10) 200 MG CAPS Take 200 mg by mouth every other day.  exemestane (AROMASIN) 25 MG tablet TAKE 1 TABLET BY MOUTH EVERY DAY AFTER BREAKFAST 90 tablet 3   Fluocinolone Acetonide 0.01 % OIL Place 5 drops in ear(s) in the morning and at bedtime. 20 mL 1   fluticasone (FLONASE) 50 MCG/ACT nasal spray SPRAY 2 SPRAYS INTO EACH NOSTRIL EVERY DAY 48 mL 3   folic acid (FOLVITE) 830 MCG tablet Take 400 mcg by mouth daily with lunch.      hydrocortisone cream 1 %  Apply 1 application topically daily as needed for itching (eczema).     levothyroxine (SYNTHROID) 125 MCG tablet Take 1 tablet (125 mcg total) by mouth daily before breakfast. 90 tablet 3   Multiple Vitamin (MULTIVITAMIN WITH MINERALS) TABS tablet Take 1 tablet by mouth daily with lunch.     Omega-3 Fatty Acids (FISH OIL) 1000 MG CAPS Take 1,000 mg by mouth daily with lunch.     Turmeric 450 MG CAPS Take 450 mg by mouth daily with lunch.      vitamin B-12 (CYANOCOBALAMIN) 500 MCG tablet Take 500 mcg by mouth daily.     vitamin C (ASCORBIC ACID) 500 MG tablet Take 500 mg by mouth 2 (two) times daily.     No current facility-administered medications for this visit.    PHYSICAL EXAMINATION: ECOG PERFORMANCE STATUS: 1 - Symptomatic but completely ambulatory  Vitals:   06/27/21 1014  BP: 131/73  Pulse: 79  Resp: 14  Temp: 97.8 F (36.6 C)  SpO2: 98%   Filed Weights   06/27/21 1014  Weight: 168 lb 4.8 oz (76.3 kg)    BREAST: No palpable masses or nodules in either right or left breasts. No palpable axillary supraclavicular or infraclavicular adenopathy no breast tenderness or nipple discharge. (exam performed in the presence of a chaperone)  LABORATORY DATA:  I have reviewed the data as listed CMP Latest Ref Rng & Units 05/14/2021 05/23/2020 02/14/2019  Glucose 70 - 99 mg/dL 90 89 -  BUN 6 - 23 mg/dL 25(H) 20 -  Creatinine 0.40 - 1.20 mg/dL 0.72 0.67 -  Sodium 135 - 145 mEq/L 138 140 -  Potassium 3.5 - 5.1 mEq/L 5.0 4.4 -  Chloride 96 - 112 mEq/L 102 103 -  CO2 19 - 32 mEq/L 26 30 -  Calcium 8.4 - 10.5 mg/dL 9.2 9.4 -  Total Protein 6.0 - 8.3 g/dL - - -  Total Bilirubin 0.2 - 1.2 mg/dL - - -  Alkaline Phos 39 - 117 U/L - - -  AST 0 - 37 U/L _0 ALT 0 - 35 U/L _1 Lab Results  Component Value Date   WBC 4.4 01/27/2018   HGB 14.0 01/27/2018   HCT 41.5 01/27/2018   MCV 94.6 01/27/2018   PLT 221.0 01/27/2018   NEUTROABS 2.7 01/27/2018    ASSESSMENT & PLAN:   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 breast 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, focal vascular involvement, posterior margin involvement, 1/2 sentinel node positive, T1 cN1 M0 stage II a, Mammaprint luminal type B, high risk, disease free survival with chemotherapy and hormonal therapy 88% versus 76% with hormonal therapy alone;   Treatment summary: Adjuvant chemotherapy with Taxotere and Cytoxan every 3 weeks 4 cycles started 01/15/15 completed 03/19/15, followed by adjuvant radiation therapy 04/10/15- 05/25/2015 Started anastrozole 5 years 05/31/2015 stopped 04/03/2016 due to tendinitis involving bilateral wrists Started letrozole 04/14/2016; changed to exemestane October 2017   Exemestane  toxicities: Marked improvement in arthralgias and myalgias.  Plan is to treat her for 7 years total.  1 more year left on antiestrogen therapy She may decide to continue beyond 7 years.   Neuropathy: Improving slowly with time. We'll continue to watch and observe. Bone density 04/09/2016: Osteopenia T score -1.9 continue with calcium and vitamin D I ordered another bone density to be done along with her mammogram on 07/05/2021 at [facility].   Breast Cancer Surveillance: 1. Breast exam 06/27/2021: No abnormalities 2. Mammograms: 07/05/2020 at [facility]: Benign    Return to clinic in 1 year for follow-up    No orders of the defined types were placed in this encounter.  The patient has a good understanding of the overall plan. she agrees with it. she will call with any problems that may develop before the next visit here.  Total time spent: 20 mins including face to face time and time spent for planning, charting and coordination of care  Rulon Eisenmenger, MD, MPH 06/27/2021  I, Thana Ates, am acting as scribe for Dr. Nicholas Lose.  I have reviewed the above documentation for accuracy and completeness, and I agree with the above.

## 2021-06-27 ENCOUNTER — Inpatient Hospital Stay: Payer: Medicare Other | Attending: Hematology and Oncology | Admitting: Hematology and Oncology

## 2021-06-27 ENCOUNTER — Other Ambulatory Visit: Payer: Self-pay

## 2021-06-27 VITALS — BP 131/73 | HR 79 | Temp 97.8°F | Resp 14 | Ht 61.0 in | Wt 168.3 lb

## 2021-06-27 DIAGNOSIS — C50211 Malignant neoplasm of upper-inner quadrant of right female breast: Secondary | ICD-10-CM | POA: Diagnosis not present

## 2021-06-27 DIAGNOSIS — C50911 Malignant neoplasm of unspecified site of right female breast: Secondary | ICD-10-CM | POA: Diagnosis not present

## 2021-06-27 DIAGNOSIS — Z79811 Long term (current) use of aromatase inhibitors: Secondary | ICD-10-CM | POA: Diagnosis not present

## 2021-06-27 DIAGNOSIS — Z923 Personal history of irradiation: Secondary | ICD-10-CM | POA: Insufficient documentation

## 2021-06-27 DIAGNOSIS — M8580 Other specified disorders of bone density and structure, unspecified site: Secondary | ICD-10-CM | POA: Insufficient documentation

## 2021-06-27 DIAGNOSIS — M85829 Other specified disorders of bone density and structure, unspecified upper arm: Secondary | ICD-10-CM

## 2021-06-27 DIAGNOSIS — Z780 Asymptomatic menopausal state: Secondary | ICD-10-CM | POA: Diagnosis not present

## 2021-06-27 DIAGNOSIS — Z170 Estrogen receptor positive status [ER+]: Secondary | ICD-10-CM | POA: Diagnosis not present

## 2021-06-27 DIAGNOSIS — Z9221 Personal history of antineoplastic chemotherapy: Secondary | ICD-10-CM | POA: Insufficient documentation

## 2021-06-27 MED ORDER — EXEMESTANE 25 MG PO TABS: ORAL_TABLET | ORAL | 3 refills | Status: DC

## 2021-06-27 NOTE — Assessment & Plan Note (Signed)
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, focal vascular involvement, posterior margin involvement, 1/2 sentinel node positive, T1 cN1 M0 stage II a, Mammaprint luminal type B, high risk, disease free survival with chemotherapy and hormonal therapy 88% versus 76% with hormonal therapy alone;  Treatment summary: Adjuvant chemotherapy with Taxotere and Cytoxan every 3 weeks 4 cycles started 01/15/15 completed 03/19/15, followed by adjuvant radiation therapy 04/10/15- 05/25/2015 Started anastrozole 5 years 05/31/2015 stopped 04/03/2016 due to tendinitis involving bilateral wrists Started letrozole 04/14/2016; changed to exemestane October 2017  Exemestanetoxicities: Marked improvement in arthralgias and myalgias. Plan is to treat Shawna Marquez for 7 years total.  1 more year left on antiestrogen therapy  Neuropathy: Improving slowly with time. We'll continue to watch and observe. Bone density 04/09/2016: Osteopenia T score -1.9 continue with calcium and vitamin D  Breast Cancer Surveillance: 1. Breast exam7/21/2022: No abnormalities 2. Mammograms:07/05/2020 at [place]  Return to clinic in1 year for follow-up

## 2021-07-23 ENCOUNTER — Encounter: Payer: Self-pay | Admitting: Hematology and Oncology

## 2021-07-23 DIAGNOSIS — Z780 Asymptomatic menopausal state: Secondary | ICD-10-CM | POA: Diagnosis not present

## 2021-07-23 DIAGNOSIS — Z1231 Encounter for screening mammogram for malignant neoplasm of breast: Secondary | ICD-10-CM | POA: Diagnosis not present

## 2021-07-23 DIAGNOSIS — M85851 Other specified disorders of bone density and structure, right thigh: Secondary | ICD-10-CM | POA: Diagnosis not present

## 2021-07-23 DIAGNOSIS — M85852 Other specified disorders of bone density and structure, left thigh: Secondary | ICD-10-CM | POA: Diagnosis not present

## 2021-07-23 DIAGNOSIS — M810 Age-related osteoporosis without current pathological fracture: Secondary | ICD-10-CM | POA: Diagnosis not present

## 2021-07-23 LAB — HM DEXA SCAN

## 2021-07-23 LAB — HM MAMMOGRAPHY

## 2021-07-29 ENCOUNTER — Encounter: Payer: Self-pay | Admitting: Family Medicine

## 2021-07-31 ENCOUNTER — Encounter: Payer: Self-pay | Admitting: Family Medicine

## 2021-07-31 DIAGNOSIS — M810 Age-related osteoporosis without current pathological fracture: Secondary | ICD-10-CM | POA: Insufficient documentation

## 2021-08-25 ENCOUNTER — Encounter: Payer: Self-pay | Admitting: Internal Medicine

## 2021-08-27 NOTE — Progress Notes (Signed)
HPI female never smoker followed for OSA, complicated by allergic rhinitis, history of right breast cancer, hypothyroid NPSG 04/21/11- AHI 47/ hr, desaturation to 85%, CPAP titrated to 8  ----------------------------------------------------------------------------------------------------------- .  08/15/20- 80 year old female never smoker followed for OSA, complicated by allergic rhinitis, history of right breast cancer, hypothyroid CPAP auto 5-12/ [employer] OSA on CPAP, Hx of breast cancer Download compliance 100%, AHI 1.2/ hr Body weight today 168 lbs She denies problems with CPAP and feels she is sleeping well. Covid vax- 2 Ph[employer] CXR 08/17/2019- FINDINGS: Cardiac shadows within normal limits. Postsurgical changes are again noted on the left. Previously seen left-sided chest port has been removed. Lungs are well aerated bilaterally. No focal infiltrate or sizable effusion is seen. Degenerative changes of the thoracic spine are noted.  IMPRESSION: No acute abnormality noted.  [birth year]80 year old female never smoker followed for OSA, complicated by allergic rhinitis, history of right breast cancer, hypothyroid CPAP auto 5-12/ [employer] Download- compliance 100%, AHI 0.8/ hr Body weight today-170 lbs Covid vax- -----Reports no issues with sleep or breathing Current nasal masks don't last as long but otherwise doing ok. Gets up to help ill husband at night.  Breast cancer treatment ok- 1 more year.   ROS-see HPI + = positive Constitutional:    weight loss, night sweats, fevers, chills, fatigue, lassitude. HEENT:    headaches, difficulty swallowing, tooth/dental problems, sore throat,       Sneezing,+ itching, ear ache, +nasal congestion, post nasal drip, snoring CV:    chest pain, orthopnea, PND, swelling in lower extremities, anasarca,                                               dizziness, palpitations Resp:   shortness of breath with exertion or at rest.                productive  cough,   non-productive cough, coughing up of blood.              change in color of mucus.  wheezing.   Skin:    rash or lesions. GI:  No-   heartburn, indigestion, abdominal pain, nausea, vomiting, diarrhea,                 change in bowel habits, loss of appetite GU: dysuria, change in color of urine, no urgency or frequency.   flank pain. MS:   joint pain, stiffness, decreased range of motion, back pain. Neuro-     nothing unusual Psych:  change in mood or affect.  depression or anxiety.   memory loss.  OBJ- Physical Exam General- Alert, Oriented, Affect-appropriate, Distress- none acute + overweight Skin- rash-none, lesions- none, excoriation- none Lymphadenopathy- none Head- atraumatic            Eyes- Gross vision intact, PERRLA, conjunctivae and secretions clear            Ears- Hearing, canals-normal            Nose- Clear, no-Septal dev, mucus, polyps, erosion, perforation             Throat- Mallampati IV , mucosa clear , drainage- none, tonsils- atrophic Neck- flexible , trachea midline, no stridor , thyroid nl, carotid no bruit Chest - symmetrical excursion , unlabored           Heart/CV- RRR , no murmur , no gallop  ,  no rub, nl s1 s2                           - JVD- none , edema- none, stasis changes- none, varices- none           Lung- clear to P&A, wheeze- none, cough- none , dullness-none, rub- none           Chest wall-  +Mastectomy L Abd-  Br/ Gen/ Rectal- Not done, not indicated Extrem- cyanosis- none, clubbing, none, atrophy- none, strength- nl Neuro- grossly intact to observation

## 2021-08-28 ENCOUNTER — Encounter: Payer: Self-pay | Admitting: Internal Medicine

## 2021-08-28 ENCOUNTER — Other Ambulatory Visit: Payer: Self-pay

## 2021-08-28 ENCOUNTER — Ambulatory Visit (INDEPENDENT_AMBULATORY_CARE_PROVIDER_SITE_OTHER): Payer: Medicare Other | Admitting: Internal Medicine

## 2021-08-28 DIAGNOSIS — G4733 Obstructive sleep apnea (adult) (pediatric): Secondary | ICD-10-CM

## 2021-08-28 DIAGNOSIS — Z9989 Dependence on other enabling machines and devices: Secondary | ICD-10-CM | POA: Diagnosis not present

## 2021-08-28 DIAGNOSIS — C50211 Malignant neoplasm of upper-inner quadrant of right female breast: Secondary | ICD-10-CM | POA: Diagnosis not present

## 2021-08-28 NOTE — Assessment & Plan Note (Signed)
Benefits from CPAP with good compliance and control. Plan- continue auto 5-12 

## 2021-08-28 NOTE — Patient Instructions (Addendum)
We can continue CPAP auto 5-12  Please call if we can help  Senior flu vax given

## 2021-08-28 NOTE — Assessment & Plan Note (Signed)
Managed by Oncology. She reports one more year of therapy.

## 2021-09-18 DIAGNOSIS — H43813 Vitreous degeneration, bilateral: Secondary | ICD-10-CM | POA: Diagnosis not present

## 2021-09-18 DIAGNOSIS — H524 Presbyopia: Secondary | ICD-10-CM | POA: Diagnosis not present

## 2021-09-18 DIAGNOSIS — H04123 Dry eye syndrome of bilateral lacrimal glands: Secondary | ICD-10-CM | POA: Diagnosis not present

## 2021-09-18 DIAGNOSIS — H26493 Other secondary cataract, bilateral: Secondary | ICD-10-CM | POA: Diagnosis not present

## 2021-10-28 DIAGNOSIS — Z23 Encounter for immunization: Secondary | ICD-10-CM | POA: Diagnosis not present

## 2021-10-29 DIAGNOSIS — Z20828 Contact with and (suspected) exposure to other viral communicable diseases: Secondary | ICD-10-CM | POA: Diagnosis not present

## 2021-12-17 ENCOUNTER — Ambulatory Visit: Payer: Medicare Other

## 2021-12-23 NOTE — Progress Notes (Signed)
Subjective:    Patient ID: Shawna Marquez, female    DOB: 1941-04-18, 81 y.o.   MRN: [id number]  This visit occurred during the SARS-CoV-2 public health emergency.  Safety protocols were in place, including screening questions prior to the visit, additional usage of staff PPE, and extensive cleaning of exam room while observing appropriate contact time as indicated for disinfecting solutions.     HPI She is here to establish with a new pcp.  I see her husband.  The patient is here for follow up of their chronic medical problems, including OSA on cpap, hypothyroidism, OP, h/o breast CA  Her left ear has been cracking recently.  She has neuropathy secondary to chemotherapy-has started using a foot massager nightly and it really helps.  Her bottom of her feet tingle and burn.    Has eczema  -dr hall - feet   Medications and allergies reviewed with patient and updated if appropriate.  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 Reported on 02/25/2016     Calcium Citrate-Vitamin D (CALCIUM CITRATE + D3 PO) Take 1 tablet by mouth 2 (two) times daily. Calcium 500 mg, Vitamin D 800 units     cetirizine (ZYRTEC) 10 MG tablet Take 10 mg by mouth daily.     cholecalciferol (VITAMIN D) 1000 UNITS tablet Take 1,000 Units by mouth daily with lunch.      Coenzyme Q10 (COQ10) 200 MG CAPS Take  200 mg by mouth every other day.     exemestane (AROMASIN) 25 MG tablet TAKE 1 TABLET BY MOUTH EVERY DAY AFTER BREAKFAST 90 tablet 3   fluticasone (FLONASE) 50 MCG/ACT nasal spray SPRAY 2 SPRAYS INTO EACH NOSTRIL EVERY DAY 48 mL 3   folic acid (FOLVITE) 662 MCG tablet Take 400 mcg by mouth daily with lunch.      hydrocortisone cream 1 % Apply 1 application topically daily as needed for itching (eczema).     levothyroxine (SYNTHROID) 125 MCG tablet Take 1 tablet (125 mcg total) by mouth daily before breakfast. 90 tablet 3   Multiple Vitamin (MULTIVITAMIN WITH MINERALS) TABS tablet Take 1 tablet by mouth daily with lunch.     Omega-3 Fatty Acids (FISH OIL) 1000 MG CAPS Take 1,000 mg by mouth daily with lunch.     Turmeric 450 MG CAPS Take 450 mg by mouth daily with lunch.      vitamin B-12 (CYANOCOBALAMIN) 500 MCG tablet Take 500 mcg by mouth daily.     vitamin C (ASCORBIC ACID) 500 MG tablet Take 500 mg by mouth 2 (two) times daily.     No current facility-administered medications on file prior to visit.    Past Medical History:  Diagnosis Date   ALLERGIC RHINITIS 10/12/2007   BREAST CANCER, HX OF 10/12/2007   right breast diagnosed - 11/2014  Cancer of upper-inner quadrant of female breast ([place]) 34/91/7915   Complication of anesthesia    bp will drop,hard to wake up   DIVERTICULITIS, HX OF 10/12/2007   Family history of breast cancer    HYPOTHYROIDISM, PRIMARY 10/12/2007   OSTEOARTHRITIS 09/09/2007   back    Radiation 04/10/15-05/25/15   Right Breast   Sleep apnea    USES cpap NIGHTLY   Wears glasses     Past Surgical History:  Procedure Laterality Date   [place]  2006   part colectomy   COLONOSCOPY  2007   colovaginal fistula  2007   colon resection   EYE SURGERY     both cataracts   HERNIA REPAIR  2007   ventral   MASTECTOMY Left 1997   lt mast-axillary node dissection   PORTACATH PLACEMENT Left 01/01/2015   Procedure: INSERTION  PORT-A-CATH;  Surgeon: Rolm Bookbinder, MD;  Location: [facility];  Service: General;  Laterality: Left;   RADIOACTIVE SEED GUIDED PARTIAL MASTECTOMY WITH AXILLARY SENTINEL LYMPH NODE BIOPSY Right 11/28/2014   Procedure: RADIOACTIVE SEED GUIDED RIGHT LUMPECTOMY WITH RIGHT AXILLARY SENTINEL LYMPH NODE BIOPSY;  Surgeon: Rolm Bookbinder, MD;  Location: [facility];  Service: General;  Laterality: Right;   RE-EXCISION OF BREAST CANCER,SUPERIOR MARGINS Right 01/01/2015   Procedure: RIGHT BREAST MARGIN EXCISION;  Surgeon: Rolm Bookbinder, MD;  Location: [facility];  Service: General;  Laterality: Right;   TONSILLECTOMY     WISDOM TOOTH EXTRACTION      Social History   Socioeconomic History   Marital status: Married    Spouse name: Not on file   Number of children: 3   Years of education: Not on file   Highest education level: Not on file  Occupational History   Occupation: retired  Tobacco Use   Smoking status: Never   Smokeless tobacco: Never  Substance and Sexual Activity   Alcohol use: Yes    Alcohol/week: 0.0 standard drinks    Comment: occassional beer   Drug use: No   Sexual activity: Yes  Other Topics Concern   Not on file  Social History Narrative   Not on file   Social Determinants of Health   Financial Resource Strain: Not on file  Food Insecurity: Not on file  Transportation Needs: Not on file  Physical Activity: Not on file  Stress: Not on file  Social Connections: Not on file    Family History  Problem Relation Age of Onset   Breast cancer Paternal Grandmother 30   Lung cancer Father 67       smoker   Prostate cancer Cousin        paternal cousin    Heart disease Neg Hx        family hx   Cancer Neg Hx        lung ca   COPD Neg Hx        family hx   Colon cancer Neg Hx     Review of Systems  Constitutional:  Negative for chills, fatigue and fever.  Eyes:  Negative for visual disturbance.  Respiratory:  Negative for cough, shortness of  breath and wheezing.   Cardiovascular:  Negative for chest pain, palpitations and leg swelling.  Gastrointestinal:  Negative for abdominal pain, blood in stool, constipation, diarrhea and nausea.       No gerd  Musculoskeletal:  Positive for arthralgias (mild). Negative for back pain.  Skin:  Positive for rash (  eczema).  Neurological:  Negative for light-headedness and headaches.  Psychiatric/Behavioral:  Negative for dysphoric mood. The patient is not nervous/anxious.       Objective:   Vitals:   12/24/21 1127  BP: 136/78  Pulse: 72  Temp: 98.1 F (36.7 C)  SpO2: 98%   BP Readings from Last 3 Encounters:  12/24/21 136/78  08/28/21 130/68  06/27/21 131/73   Wt Readings from Last 3 Encounters:  12/24/21 162 lb (73.5 kg)  08/28/21 170 lb (77.1 kg)  06/27/21 168 lb 4.8 oz (76.3 kg)   Body mass index is 29.63 kg/m.   Depression screen [facility] 2/9 12/24/2021 12/11/2020 05/23/2020 02/09/2019 01/27/2018  Decreased Interest 0 0 0 0 0  Down, Depressed, Hopeless 0 0 0 0 0  PHQ - 2 Score 0 0 0 0 0  Altered sleeping 0 - - - -  Tired, decreased energy 0 - - - -  Change in appetite 0 - - - -  Feeling bad or failure about yourself  0 - - - -  Trouble concentrating 0 - - - -  Moving slowly or fidgety/restless 0 - - - -  Suicidal thoughts 0 - - - -  PHQ-9 Score 0 - - - -  Some recent data might be hidden    GAD 7 : Generalized Anxiety Score 12/24/2021  Nervous, Anxious, on Edge 0  Control/stop worrying 0  Worry too much - different things 0  Trouble relaxing 0  Restless 0  Easily annoyed or irritable 0  Afraid - awful might happen 0  Total GAD 7 Score 0      Physical Exam    Constitutional: Appears well-developed and well-nourished. No distress.  HENT:  Head: Normocephalic and atraumatic.  Neck: Neck supple. No tracheal deviation present. No thyromegaly present.  No cervical lymphadenopathy Cardiovascular: Normal rate, regular rhythm and normal heart sounds.   No murmur heard.  No carotid bruit .  No edema Pulmonary/Chest: Effort normal and breath sounds normal. No respiratory distress. No has no wheezes. No rales.  Abdomen: soft, NT, ND Skin: Skin is warm and dry. Not diaphoretic.  Psychiatric: Normal mood and affect. Behavior is normal.      Assessment & Plan:    Screened for depression using the PHQ 9 scale.  No evidence of depression.   Screened for anxiety using GAD7 Scale.  No evidence of anxiety.   See Problem List for Assessment and Plan of chronic medical problems.

## 2021-12-24 ENCOUNTER — Ambulatory Visit (INDEPENDENT_AMBULATORY_CARE_PROVIDER_SITE_OTHER): Payer: Medicare Other | Admitting: Internal Medicine

## 2021-12-24 ENCOUNTER — Encounter: Payer: Self-pay | Admitting: Internal Medicine

## 2021-12-24 ENCOUNTER — Other Ambulatory Visit: Payer: Self-pay

## 2021-12-24 VITALS — BP 136/78 | HR 72 | Temp 98.1°F | Ht 62.0 in | Wt 162.0 lb

## 2021-12-24 DIAGNOSIS — L2082 Flexural eczema: Secondary | ICD-10-CM | POA: Diagnosis not present

## 2021-12-24 DIAGNOSIS — Z9989 Dependence on other enabling machines and devices: Secondary | ICD-10-CM

## 2021-12-24 DIAGNOSIS — E039 Hypothyroidism, unspecified: Secondary | ICD-10-CM

## 2021-12-24 DIAGNOSIS — G622 Polyneuropathy due to other toxic agents: Secondary | ICD-10-CM | POA: Diagnosis not present

## 2021-12-24 DIAGNOSIS — M810 Age-related osteoporosis without current pathological fracture: Secondary | ICD-10-CM

## 2021-12-24 DIAGNOSIS — C50911 Malignant neoplasm of unspecified site of right female breast: Secondary | ICD-10-CM

## 2021-12-24 DIAGNOSIS — G4733 Obstructive sleep apnea (adult) (pediatric): Secondary | ICD-10-CM

## 2021-12-24 DIAGNOSIS — Z1331 Encounter for screening for depression: Secondary | ICD-10-CM | POA: Diagnosis not present

## 2021-12-24 DIAGNOSIS — Z853 Personal history of malignant neoplasm of breast: Secondary | ICD-10-CM

## 2021-12-24 DIAGNOSIS — L309 Dermatitis, unspecified: Secondary | ICD-10-CM | POA: Insufficient documentation

## 2021-12-24 LAB — CBC WITH DIFFERENTIAL/PLATELET
Basophils Absolute: 0.1 10*3/uL (ref 0.0–0.1)
Basophils Relative: 0.8 % (ref 0.0–3.0)
Eosinophils Absolute: 0.2 10*3/uL (ref 0.0–0.7)
Eosinophils Relative: 2.6 % (ref 0.0–5.0)
HCT: 43.7 % (ref 36.0–46.0)
Hemoglobin: 14.3 g/dL (ref 12.0–15.0)
Lymphocytes Relative: 15.5 % (ref 12.0–46.0)
Lymphs Abs: 1 10*3/uL (ref 0.7–4.0)
MCHC: 32.8 g/dL (ref 30.0–36.0)
MCV: 95.3 fl (ref 78.0–100.0)
Monocytes Absolute: 0.5 10*3/uL (ref 0.1–1.0)
Monocytes Relative: 7.1 % (ref 3.0–12.0)
Neutro Abs: 5 10*3/uL (ref 1.4–7.7)
Neutrophils Relative %: 74 % (ref 43.0–77.0)
Platelets: 165 10*3/uL (ref 150.0–400.0)
RBC: 4.58 Mil/uL (ref 3.87–5.11)
RDW: 12.7 % (ref 11.5–15.5)
WBC: 6.7 10*3/uL (ref 4.0–10.5)

## 2021-12-24 LAB — COMPREHENSIVE METABOLIC PANEL
ALT: 17 U/L (ref 0–35)
AST: 22 U/L (ref 0–37)
Albumin: 4.3 g/dL (ref 3.5–5.2)
Alkaline Phosphatase: 71 U/L (ref 39–117)
BUN: 17 mg/dL (ref 6–23)
CO2: 30 mEq/L (ref 19–32)
Calcium: 10 mg/dL (ref 8.4–10.5)
Chloride: 101 mEq/L (ref 96–112)
Creatinine, Ser: 0.71 mg/dL (ref 0.40–1.20)
GFR: 78.92 mL/min (ref >60.00)
Glucose, Bld: 93 mg/dL (ref 70–99)
Potassium: 4.3 mEq/L (ref 3.5–5.1)
Sodium: 140 mEq/L (ref 135–145)
Total Bilirubin: 0.5 mg/dL (ref 0.2–1.2)
Total Protein: 7.5 g/dL (ref 6.0–8.3)

## 2021-12-24 LAB — TSH: TSH: 0.88 u[IU]/mL (ref 0.35–5.50)

## 2021-12-24 NOTE — Assessment & Plan Note (Signed)
Chronic Mild and controlled Follows with Dr. Nevada Crane

## 2021-12-24 NOTE — Patient Instructions (Addendum)
°  Blood work was ordered.     Medications changes include :   none     Please followup in 1 year

## 2021-12-24 NOTE — Assessment & Plan Note (Signed)
Chronic  Clinically euthyroid Currently taking levothyroxine 125 mcg daily Check tsh  Titrate med dose if needed  

## 2021-12-24 NOTE — Assessment & Plan Note (Signed)
Chronic Bilateral plantar surfaces of feet Secondary to chemotherapy that she had for breast cancer Has tingling/burning Taking B12 sublingual Started using foot massager nightly and that has helped

## 2021-12-24 NOTE — Assessment & Plan Note (Addendum)
Initially diagnosed in 1999 and recurrence in 2015 Currently on Aromasin Following with Dr. Lindi Adie

## 2022-03-27 DIAGNOSIS — Z20822 Contact with and (suspected) exposure to covid-19: Secondary | ICD-10-CM | POA: Diagnosis not present

## 2022-04-08 DIAGNOSIS — Z1152 Encounter for screening for COVID-19: Secondary | ICD-10-CM | POA: Diagnosis not present

## 2022-04-08 DIAGNOSIS — Z20828 Contact with and (suspected) exposure to other viral communicable diseases: Secondary | ICD-10-CM | POA: Diagnosis not present

## 2022-04-10 DIAGNOSIS — Z20822 Contact with and (suspected) exposure to covid-19: Secondary | ICD-10-CM | POA: Diagnosis not present

## 2022-04-16 ENCOUNTER — Ambulatory Visit (INDEPENDENT_AMBULATORY_CARE_PROVIDER_SITE_OTHER): Payer: Medicare Other

## 2022-04-16 DIAGNOSIS — Z0000 Encounter for general adult medical examination without abnormal findings: Secondary | ICD-10-CM

## 2022-04-16 NOTE — Progress Notes (Signed)
?I connected with Shawna Marquez today by telephone and verified that I am speaking with the correct person using two identifiers. ?Location patient: home ?Location provider: work ?Persons participating in the virtual visit: patient, provider. ?  ?I discussed the limitations, risks, security and privacy concerns of performing an evaluation and management service by telephone and the availability of in person appointments. I also discussed with the patient that there may be a patient responsible charge related to this service. The patient expressed understanding and verbally consented to this telephonic visit.  ?  ?Interactive audio and video telecommunications were attempted between this provider and patient, however failed, due to patient having technical difficulties OR patient did not have access to video capability.  We continued and completed visit with audio only. ? ?Some vital signs may be absent or patient reported.  ? ?Time Spent with patient on telephone encounter: 30 minutes ? ?Subjective:  ? Shawna Marquez is a 81 y.o. female who presents for Medicare Annual (Subsequent) preventive examination. ? ?Review of Systems    ? ?Cardiac Risk Factors include: advanced age (>83mn, >>15women) ? ?   ?Objective:  ?  ?There were no vitals filed for this visit. ?There is no height or weight on file to calculate BMI. ? ? ?  04/16/2022  ?  1:06 PM 12/11/2020  ?  9:49 AM 02/09/2019  ?  9:00 AM 12/11/2016  ?  2:06 PM 08/30/2015  ? 12:09 PM 05/31/2015  ?  9:06 AM 04/19/2015  ?  1:21 PM  ?Advanced Directives  ?Does Patient Have a Medical Advance Directive? Yes Yes Yes No Yes Yes Yes  ?Type of Advance Directive Living will;Healthcare Power of Attorney Living will;Healthcare [place] of [place]  [place]  ?Does patient want to make changes to medical advance directive? No - Patient declined  No - Patient declined      ?Copy of [place] Chart? No - copy requested No - copy requested No - copy requested  No - copy requested    ? ? ?Current Medications (verified) ?Outpatient Encounter Medications as of 04/16/2022  ?Medication Sig  ? aspirin EC 81 MG tablet Take 81 mg by mouth at bedtime. Reported on 02/25/2016  ? Calcium Citrate-Vitamin D (CALCIUM CITRATE + D3 PO) Take 1 tablet by mouth 2 (two) times daily. Calcium 500 mg, Vitamin D 800 units  ? cetirizine (ZYRTEC) 10 MG tablet Take 10 mg by mouth daily.  ? cholecalciferol (VITAMIN D) 1000 UNITS tablet Take 1,000 Units by mouth daily with lunch.   ? Coenzyme Q10 (COQ10) 200 MG CAPS Take 200 mg by mouth every other day.  ? exemestane (AROMASIN) 25 MG tablet TAKE 1 TABLET BY MOUTH EVERY DAY AFTER BREAKFAST  ? fluticasone (FLONASE) 50 MCG/ACT nasal spray SPRAY 2 SPRAYS INTO EACH NOSTRIL EVERY DAY  ? folic acid (FOLVITE) 4681MCG tablet Take 400 mcg by mouth daily with lunch.   ? hydrocortisone cream 1 % Apply 1 application topically daily as needed for itching (eczema).  ? levothyroxine (SYNTHROID) 125 MCG tablet Take 1 tablet (125 mcg total) by mouth daily before breakfast.  ? Multiple Vitamin (MULTIVITAMIN WITH MINERALS) TABS tablet Take 1 tablet by mouth daily with lunch.  ? Omega-3 Fatty Acids (FISH OIL) 1000 MG CAPS Take 1,000 mg by mouth daily with lunch.  ? Turmeric 450 MG CAPS Take 450 mg by mouth daily with lunch.   ?  vitamin B-12 (CYANOCOBALAMIN) 500 MCG tablet Take 500 mcg by mouth daily.  ? vitamin C (ASCORBIC ACID) 500 MG tablet Take 500 mg by mouth 2 (two) times daily.  ? ?No facility-administered encounter medications on file as of 04/16/2022.  ? ? ?Allergies (verified) ?Patient has no known allergies.  ? ?History: ?Past Medical History:  ?Diagnosis Date  ? ALLERGIC RHINITIS 10/12/2007  ? BREAST CANCER, HX OF 10/12/2007  ? right breast diagnosed - 11/2014  ? Cancer of upper-inner quadrant of female breast ([place]) 11/17/2014  ? Complication of anesthesia   ? bp will drop,hard to wake  up  ? DIVERTICULITIS, HX OF 10/12/2007  ? Family history of breast cancer   ? HYPOTHYROIDISM, PRIMARY 10/12/2007  ? OSTEOARTHRITIS 09/09/2007  ? back   ? Radiation 04/10/15-05/25/15  ? Right Breast  ? Sleep apnea   ? USES cpap NIGHTLY  ? Wears glasses   ? ?Past Surgical History:  ?Procedure Laterality Date  ? ABDOMINAL HYSTERECTOMY  1977  ? COLON SURGERY  2006  ? part colectomy  ? COLONOSCOPY  2007  ? colovaginal fistula  2007  ? colon resection  ? EYE SURGERY    ? both cataracts  ? HERNIA REPAIR  2007  ? ventral  ? MASTECTOMY Left 1997  ? lt mast-axillary node dissection  ? PORTACATH PLACEMENT Left 01/01/2015  ? Procedure: INSERTION PORT-A-CATH;  Surgeon: Rolm Bookbinder, MD;  Location: [place];  Service: General;  Laterality: Left;  ? RADIOACTIVE SEED GUIDED PARTIAL MASTECTOMY WITH AXILLARY SENTINEL LYMPH NODE BIOPSY Right 11/28/2014  ? Procedure: RADIOACTIVE SEED GUIDED RIGHT LUMPECTOMY WITH RIGHT AXILLARY SENTINEL LYMPH NODE BIOPSY;  Surgeon: Rolm Bookbinder, MD;  Location: [facility];  Service: General;  Laterality: Right;  ? RE-EXCISION OF BREAST CANCER,SUPERIOR MARGINS Right 01/01/2015  ? Procedure: RIGHT BREAST MARGIN EXCISION;  Surgeon: Rolm Bookbinder, MD;  Location: [facility];  Service: General;  Laterality: Right;  ? TONSILLECTOMY    ? WISDOM TOOTH EXTRACTION    ? ?Family History  ?Problem Relation Age of Onset  ? Breast cancer Paternal Grandmother 62  ? Lung cancer Father 50  ?     smoker  ? Prostate cancer Cousin   ?     paternal cousin   ? Heart disease Neg Hx   ?     family hx  ? Cancer Neg Hx   ?     lung ca  ? COPD Neg Hx   ?     family hx  ? Colon cancer Neg Hx   ? ?Social History  ? ?Socioeconomic History  ? Marital status: Married  ?  Spouse name: Not on file  ? Number of children: 3  ? Years of education: Not on file  ? Highest education level: Not on file  ?Occupational History  ? Occupation: retired  ?Tobacco Use  ? Smoking status: Never  ? Smokeless tobacco: Never  ?Substance and  Sexual Activity  ? Alcohol use: Yes  ?  Alcohol/week: 0.0 standard drinks  ?  Comment: occassional beer  ? Drug use: No  ? Sexual activity: Yes  ?Other Topics Concern  ? Not on file  ?Social History Narrative  ? Not on file  ? ?Social Determinants of Health  ? ?Financial Resource Strain: Low Risk   ? Difficulty of Paying Living Expenses: Not hard at all  ?Food Insecurity: No Food Insecurity  ? Worried About Charity fundraiser in the Last Year: Never true  ? Ran Out  of Food in the Last Year: Never true  ?Transportation Needs: No Transportation Needs  ? Lack of Transportation (Medical): No  ? Lack of Transportation (Non-Medical): No  ?Physical Activity: Sufficiently Active  ? Days of Exercise per Week: 5 days  ? Minutes of Exercise per Session: 30 min  ?Stress: No Stress Concern Present  ? Feeling of Stress : Not at all  ?Social Connections: Socially Integrated  ? Frequency of Communication with Friends and Family: More than three times a week  ? Frequency of Social Gatherings with Friends and Family: More than three times a week  ? Attends Religious Services: 1 to 4 times per year  ? Active Member of Clubs or Organizations: Yes  ? Attends Archivist Meetings: 1 to 4 times per year  ? Marital Status: Married  ? ? ?Tobacco Counseling ?Counseling given: Not Answered ? ? ?Clinical Intake: ? ?Pre-visit preparation completed: Yes ? ?Pain : No/denies pain ? ?  ? ?Nutritional Risks: None ?Diabetes: No ? ?How often do you need to have someone help you when you read instructions, pamphlets, or other written materials from your doctor or pharmacy?: 1 - Never ?What is the last grade level you completed in school?: College Graduate ? ?Diabetic? no ? ?Interpreter Needed?: No ? ?Information entered by :: Lisette Abu, LPN. ? ? ?Activities of Daily Living ? ?  04/16/2022  ?  1:15 PM 12/24/2021  ? 11:39 AM  ?In your present state of health, do you have any difficulty performing the following activities:  ?Hearing? 0 0   ?Vision? 0 0  ?Difficulty concentrating or making decisions? 0 0  ?Walking or climbing stairs? 0 0  ?Dressing or bathing? 0 0  ?Doing errands, shopping? 0 0  ?Preparing Food and eating ? N   ?Using the Toilet? N

## 2022-04-16 NOTE — Patient Instructions (Addendum)
Shawna Marquez , ?Thank you for taking time to come for your Medicare Wellness Visit. I appreciate your ongoing commitment to your health goals. Please review the following plan we discussed and let me know if I can assist you in the future.  ? ?Screening recommendations/referrals: ?Colonoscopy: Discontinued due to age; last done 03/10/2016 ?Mammogram: 07/23/2021; due once a year ?Bone Density: 07/23/2021; due every 2 years ?Recommended yearly ophthalmology/optometry visit for glaucoma screening and checkup ?Recommended yearly dental visit for hygiene and checkup ? ?Vaccinations: ?Influenza vaccine: 08/28/2021 ?Pneumococcal vaccine: 02/06/2004, 02/24/2014 ?Tdap vaccine: 02/08/2013; due every 10 years ?Shingles vaccine: never done   ?Covid-19: 01/28/2020, 02/21/2020, 10/03/2020, 10/28/2021 ? ?Advanced directives: Yes; Please bring a copy of your health care power of attorney and living will to the office at your convenience. ? ?Conditions/risks identified: Yes ? ?Next appointment: Please schedule your next Medicare Wellness Visit with your Nurse Health Advisor in 1 year by calling [phone]. ? ? ?Preventive Care 83 Years and Older, Female ?Preventive care refers to lifestyle choices and visits with your health care provider that can promote health and wellness. ?What does preventive care include? ?A yearly physical exam. This is also called an annual well check. ?Dental exams once or twice a year. ?Routine eye exams. Ask your health care provider how often you should have your eyes checked. ?Personal lifestyle choices, including: ?Daily care of your teeth and gums. ?Regular physical activity. ?Eating a healthy diet. ?Avoiding tobacco and drug use. ?Limiting alcohol use. ?Practicing safe sex. ?Taking low-dose aspirin every day. ?Taking vitamin and mineral supplements as recommended by your health care provider. ?What happens during an annual well check? ?The services and screenings done by your health care provider during your  annual well check will depend on your age, overall health, lifestyle risk factors, and family history of disease. ?Counseling  ?Your health care provider may ask you questions about your: ?Alcohol use. ?Tobacco use. ?Drug use. ?Emotional well-being. ?Home and relationship well-being. ?Sexual activity. ?Eating habits. ?History of falls. ?Memory and ability to understand (cognition). ?Work and work Statistician. ?Reproductive health. ?Screening  ?You may have the following tests or measurements: ?Height, weight, and BMI. ?Blood pressure. ?Lipid and cholesterol levels. These may be checked every 5 years, or more frequently if you are over 61 years old. ?Skin check. ?Lung cancer screening. You may have this screening every year starting at age 42 if you have a 30-pack-year history of smoking and currently smoke or have quit within the past 15 years. ?Fecal occult blood test (FOBT) of the stool. You may have this test every year starting at age 42. ?Flexible sigmoidoscopy or colonoscopy. You may have a sigmoidoscopy every 5 years or a colonoscopy every 10 years starting at age 72. ?Hepatitis C blood test. ?Hepatitis B blood test. ?Sexually transmitted disease (STD) testing. ?Diabetes screening. This is done by checking your blood sugar (glucose) after you have not eaten for a while (fasting). You may have this done every 1-3 years. ?Bone density scan. This is done to screen for osteoporosis. You may have this done starting at age 55. ?Mammogram. This may be done every 1-2 years. Talk to your health care provider about how often you should have regular mammograms. ?Talk with your health care provider about your test results, treatment options, and if necessary, the need for more tests. ?Vaccines  ?Your health care provider may recommend certain vaccines, such as: ?Influenza vaccine. This is recommended every year. ?Tetanus, diphtheria, and acellular pertussis (Tdap, Td) vaccine. You may  need a Td booster every 10  years. ?Zoster vaccine. You may need this after age 43. ?Pneumococcal 13-valent conjugate (PCV13) vaccine. One dose is recommended after age 55. ?Pneumococcal polysaccharide (PPSV23) vaccine. One dose is recommended after age 73. ?Talk to your health care provider about which screenings and vaccines you need and how often you need them. ?This information is not intended to replace advice given to you by your health care provider. Make sure you discuss any questions you have with your health care provider. ?Document Released: 12/21/2015 Document Revised: 08/13/2016 Document Reviewed: 09/25/2015 ?Elsevier Interactive Patient Education ? 2017 [employer]. ? ?Fall Prevention in the Home ?Falls can cause injuries. They can happen to people of all ages. There are many things you can do to make your home safe and to help prevent falls. ?What can I do on the outside of my home? ?Regularly fix the edges of walkways and driveways and fix any cracks. ?Remove anything that might make you trip as you walk through a door, such as a raised step or threshold. ?Trim any bushes or trees on the path to your home. ?Use bright outdoor lighting. ?Clear any walking paths of anything that might make someone trip, such as rocks or tools. ?Regularly check to see if handrails are loose or broken. Make sure that both sides of any steps have handrails. ?Any raised decks and porches should have guardrails on the edges. ?Have any leaves, snow, or ice cleared regularly. ?Use sand or salt on walking paths during winter. ?Clean up any spills in your garage right away. This includes oil or grease spills. ?What can I do in the bathroom? ?Use night lights. ?Install grab bars by the toilet and in the tub and shower. Do not use towel bars as grab bars. ?Use non-skid mats or decals in the tub or shower. ?If you need to sit down in the shower, use a plastic, non-slip stool. ?Keep the floor dry. Clean up any water that spills on the floor as soon as it  happens. ?Remove soap buildup in the tub or shower regularly. ?Attach bath mats securely with double-sided non-slip rug tape. ?Do not have throw rugs and other things on the floor that can make you trip. ?What can I do in the bedroom? ?Use night lights. ?Make sure that you have a light by your bed that is easy to reach. ?Do not use any sheets or blankets that are too big for your bed. They should not hang down onto the floor. ?Have a firm chair that has side arms. You can use this for support while you get dressed. ?Do not have throw rugs and other things on the floor that can make you trip. ?What can I do in the kitchen? ?Clean up any spills right away. ?Avoid walking on wet floors. ?Keep items that you use a lot in easy-to-reach places. ?If you need to reach something above you, use a strong step stool that has a grab bar. ?Keep electrical cords out of the way. ?Do not use floor polish or wax that makes floors slippery. If you must use wax, use non-skid floor wax. ?Do not have throw rugs and other things on the floor that can make you trip. ?What can I do with my stairs? ?Do not leave any items on the stairs. ?Make sure that there are handrails on both sides of the stairs and use them. Fix handrails that are broken or loose. Make sure that handrails are as long as the stairways. ?  Check any carpeting to make sure that it is firmly attached to the stairs. Fix any carpet that is loose or worn. ?Avoid having throw rugs at the top or bottom of the stairs. If you do have throw rugs, attach them to the floor with carpet tape. ?Make sure that you have a light switch at the top of the stairs and the bottom of the stairs. If you do not have them, ask someone to add them for you. ?What else can I do to help prevent falls? ?Wear shoes that: ?Do not have high heels. ?Have rubber bottoms. ?Are comfortable and fit you well. ?Are closed at the toe. Do not wear sandals. ?If you use a stepladder: ?Make sure that it is fully opened.  Do not climb a closed stepladder. ?Make sure that both sides of the stepladder are locked into place. ?Ask someone to hold it for you, if possible. ?Clearly mark and make sure that you can see: ?Any grab bars or hand

## 2022-05-15 ENCOUNTER — Encounter: Payer: Self-pay | Admitting: Internal Medicine

## 2022-06-09 NOTE — Telephone Encounter (Signed)
Pt request Rx be resent pharmacy didn't receive it   levothyroxine (SYNTHROID) 125 MCG tablet   [id number] [place]  [phone] Fax:  [phone]

## 2022-06-11 ENCOUNTER — Other Ambulatory Visit: Payer: Self-pay

## 2022-06-11 DIAGNOSIS — E039 Hypothyroidism, unspecified: Secondary | ICD-10-CM

## 2022-06-11 MED ORDER — LEVOTHYROXINE SODIUM 125 MCG PO TABS: 125.0000 ug | ORAL_TABLET | Freq: Every day | ORAL | 3 refills | Status: DC

## 2022-06-18 DIAGNOSIS — L820 Inflamed seborrheic keratosis: Secondary | ICD-10-CM | POA: Diagnosis not present

## 2022-06-18 DIAGNOSIS — L821 Other seborrheic keratosis: Secondary | ICD-10-CM | POA: Diagnosis not present

## 2022-06-20 ENCOUNTER — Other Ambulatory Visit: Payer: Self-pay | Admitting: Hematology and Oncology

## 2022-06-27 ENCOUNTER — Inpatient Hospital Stay: Payer: Medicare Other | Attending: Hematology and Oncology | Admitting: Hematology and Oncology

## 2022-06-27 ENCOUNTER — Other Ambulatory Visit: Payer: Self-pay

## 2022-06-27 DIAGNOSIS — Z923 Personal history of irradiation: Secondary | ICD-10-CM | POA: Diagnosis not present

## 2022-06-27 DIAGNOSIS — Z170 Estrogen receptor positive status [ER+]: Secondary | ICD-10-CM | POA: Diagnosis not present

## 2022-06-27 DIAGNOSIS — Z79811 Long term (current) use of aromatase inhibitors: Secondary | ICD-10-CM | POA: Insufficient documentation

## 2022-06-27 DIAGNOSIS — Z9012 Acquired absence of left breast and nipple: Secondary | ICD-10-CM | POA: Diagnosis not present

## 2022-06-27 DIAGNOSIS — C50211 Malignant neoplasm of upper-inner quadrant of right female breast: Secondary | ICD-10-CM | POA: Diagnosis not present

## 2022-06-27 NOTE — Assessment & Plan Note (Signed)
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, focal vascular involvement, posterior margin involvement, 1/2 sentinel node positive, T1 cN1 M0 stage II a, Mammaprint luminal type B, high risk, disease free survival with chemotherapy and hormonal therapy 88% versus 76% with hormonal therapy alone;  Treatment summary: Adjuvant chemotherapy with Taxotere and Cytoxan every 3 weeks 4 cycles started 01/15/15 completed 03/19/15, followed by adjuvant radiation therapy 04/10/15- 05/25/2015 Started anastrozole 5 years 05/31/2015 stopped 04/03/2016 due to tendinitis involving bilateral wrists Started letrozole 04/14/2016; changed to exemestane October 2017  Exemestanetoxicities: Marked improvement in arthralgias and myalgias. Plan is to treat her for 7 years total.  1 more year left on antiestrogen therapy She may decide to continue beyond 7 years.  Neuropathy: Improving slowly with time. We'll continue to watch and observe. Bone density 04/09/2016: Osteopenia T score -1.9 continue with calcium and vitamin D I ordered another bone density to be done along with her mammogram on 07/05/2021 at [facility].  Breast Cancer Surveillance: 1. Breast exam7/21/2023: No abnormalities 2. Mammograms:07/23/2021 at [facility]  Return to clinic in1 year for follow-up

## 2022-06-27 NOTE — Progress Notes (Signed)
Patient Care Team: Binnie Rail, MD as PCP - General (Internal Medicine) Rolm Bookbinder, MD as Consulting Physician (General Surgery) Nicholas Lose, MD as Consulting Physician (Hematology and Oncology) Thea Silversmith, MD as Consulting Physician (Radiation Oncology) Holley Bouche, NP (Inactive) as Nurse Practitioner (Nurse Practitioner) Sylvan Cheese, NP as Nurse Practitioner (Nurse Practitioner) Marygrace Drought, MD as Consulting Physician (Ophthalmology) Allyn Kenner, MD as Consulting Physician (Dermatology) Deneise Lever, MD as Consulting Physician (Pulmonary Disease)  DIAGNOSIS:  Encounter Diagnosis  Name Primary?   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 breast ([place])     SUMMARY OF ONCOLOGIC HISTORY: Oncology History  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 breast ([place])  06/07/1996 Miscellaneous   History of Stage IIB left breast cancer S/P mastectomy and chemotherapy (Adriamycin and Cytoxan) on CALGB 9397.   11/10/2014 Mammogram   Right breast: 9 mm mass    11/13/2014 Breast US   Right breast: 9 mm mass at 1:00 position with indistinct margin, middle depth, hypoechoic.   11/15/2014 Initial Biopsy   Right breast needle biopsy: Invasive ductal carcinoma, grade 2, with DCIS with lymphovascular invasion ER+ (100%), PR+ (100%), HER-2/neu negative (ratio 1.87), Ki-67 23%   11/15/2014 Clinical Stage   Stage IA; T1b N0   11/28/2014 Definitive Surgery   Right breast lumpectomy with SLNB Donne Hazel): Invasive ductal carcinoma, grade 3, 1.7 cm, with DCIS, focal vascular involvement, posterior margin involvement, 1/2 sentinel nodes positive for malignancy, repeat HER2/neu negative (ratio 1.14)   11/28/2014 Procedure   Mammaprint: High Risk Luminal Type B.    11/28/2014 Pathologic Stage   Stage IIA: pT1c, pN1a   01/01/2015 Surgery   Rexcision of right breast margin: surgical margins negative for malignancy   01/15/2015 -  03/20/2015 Chemotherapy   Adjuvant chemotherapy with Taxotere and Cyclophosphamide X 4 cycles (Sanaii Caporaso)   04/10/2015 - 05/25/2015 Radiation Therapy   Adjuvant XRT completed Pablo Ledger): Right breast 45 Gy over 25 fractions; Right breast boost 16 Gy at 2 Gy over 8 fractions. Total dose 61 Gy   05/31/2015 -  Anti-estrogen oral therapy   Anastrozole 1 mg daily stopped at 04/03/2016 due to tendinitis switched to Femara 04/21/2016 discontinued June 2017, starting exemestane 09/11/2016   08/03/2015 Survivorship   Survivorship visit completed and copy of survivorship care plan provided to patient.     CHIEF COMPLIANT: Follow-up of recurrent right breast cancer on exemestane  INTERVAL HISTORY: Shawna Marquez is a  81 y.o. with above-mentioned history of recurrent right breast cancer who is currently on anti-estrogen therapy with exemestane. She states that she is tolerating the exemestane very well. Denies hot flashes mild joint stiffness in fingers. She complains of feet tingling on and off in her toes mainly at night. On a scale of 1-10 she gives it a 4. She is able to go on with her daily activities. She is very active and is getting time for exercise.   ALLERGIES:  has No Known Allergies.  MEDICATIONS:  Current Outpatient Medications  Medication Sig Dispense Refill   aspirin EC 81 MG tablet Take 81 mg by mouth at bedtime. Reported on 02/25/2016     Calcium Citrate-Vitamin D (CALCIUM CITRATE + D3 PO) Take 1 tablet by mouth 2 (two) times daily. Calcium 500 mg, Vitamin D 800 units     cetirizine (ZYRTEC) 10 MG tablet Take 10 mg by mouth daily.     cholecalciferol (VITAMIN D) 1000 UNITS tablet Take 1,000 Units by mouth daily  with lunch.      Coenzyme Q10 (COQ10) 200 MG CAPS Take 200 mg by mouth every other day.     exemestane (AROMASIN) 25 MG tablet TAKE 1 TABLET BY MOUTH EVERY DAY AFTER BREAKFAST 90 tablet 3   fluticasone (FLONASE) 50 MCG/ACT nasal spray SPRAY 2 SPRAYS INTO EACH NOSTRIL EVERY DAY 48 mL  3   folic acid (FOLVITE) 923 MCG tablet Take 400 mcg by mouth daily with lunch.      hydrocortisone cream 1 % Apply 1 application topically daily as needed for itching (eczema).     levothyroxine (SYNTHROID) 125 MCG tablet Take 1 tablet (125 mcg total) by mouth daily before breakfast. 90 tablet 3   Multiple Vitamin (MULTIVITAMIN WITH MINERALS) TABS tablet Take 1 tablet by mouth daily with lunch.     Omega-3 Fatty Acids (FISH OIL) 1000 MG CAPS Take 1,000 mg by mouth daily with lunch.     Turmeric 450 MG CAPS Take 450 mg by mouth daily with lunch.      vitamin B-12 (CYANOCOBALAMIN) 500 MCG tablet Take 500 mcg by mouth daily.     vitamin C (ASCORBIC ACID) 500 MG tablet Take 500 mg by mouth 2 (two) times daily.     No current facility-administered medications for this visit.    PHYSICAL EXAMINATION: ECOG PERFORMANCE STATUS: 1 - Symptomatic but completely ambulatory  Vitals:   06/27/22 1016  BP: 140/73  Pulse: 70  Resp: 16  Temp: (!) 97.4 F (36.3 C)  SpO2: 99%   Filed Weights   06/27/22 1016  Weight: 167 lb 8 oz (76 kg)    BREAST: No palpable masses or nodules in either right   breasts.  Left mastectomy scar is without any lumps or nodules.  No palpable axillary supraclavicular or infraclavicular adenopathy no breast tenderness or nipple discharge. (exam performed in the presence of a chaperone)  LABORATORY DATA:  I have reviewed the data as listed    Latest Ref Rng & Units 12/24/2021   12:07 PM 05/14/2021   12:24 PM 05/23/2020    8:49 AM  CMP  Glucose 70 - 99 mg/dL 93  90  89   BUN 6 - 23 mg/dL $Remove'17  25  20   'wtWfJPs$ Creatinine 0.40 - 1.20 mg/dL 0.71  0.72  0.67   Sodium 135 - 145 mEq/L 140  138  140   Potassium 3.5 - 5.1 mEq/L 4.3  5.0  4.4   Chloride 96 - 112 mEq/L 101  102  103   CO2 19 - 32 mEq/L $Remove'30  26  30   'PmYAsJB$ Calcium 8.4 - 10.5 mg/dL 10.0  9.2  9.4   Total Protein 6.0 - 8.3 g/dL 7.5     Total Bilirubin 0.2 - 1.2 mg/dL 0.5     Alkaline Phos 39 - 117 U/L 71     AST 0 - 37 U/L $Remo'22   17  18   'eNpOh$ ALT 0 - 35 U/L $Remo'17  14  15     'COsRt$ Lab Results  Component Value Date   WBC 6.7 12/24/2021   HGB 14.3 12/24/2021   HCT 43.7 12/24/2021   MCV 95.3 12/24/2021   PLT 165.0 12/24/2021   NEUTROABS 5.0 12/24/2021    ASSESSMENT & PLAN:  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 breast 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, focal vascular involvement, posterior margin involvement, 1/2 sentinel node positive, T1 cN1 M0 stage II a, Mammaprint luminal type B, high  risk, disease free survival with chemotherapy and hormonal therapy 88% versus 76% with hormonal therapy alone;   Treatment summary: Adjuvant chemotherapy with Taxotere and Cytoxan every 3 weeks 4 cycles started 01/15/15 completed 03/19/15, followed by adjuvant radiation therapy 04/10/15- 05/25/2015 Started anastrozole 5 years 05/31/2015 stopped 04/03/2016 due to tendinitis involving bilateral wrists Started letrozole 04/14/2016; changed to exemestane October 2017   Exemestane toxicities: Marked improvement in arthralgias and myalgias.  Plan is to treat her for 7 years total.  1 more year left on antiestrogen therapy She may decide to continue beyond 7 years.   Neuropathy: Improving slowly with time. We'll continue to watch and observe. Bone density 04/09/2016: Osteopenia T score -1.9 continue with calcium and vitamin D I ordered another bone density to be done along with her mammogram on 07/05/2021 at [facility].   Breast Cancer Surveillance: 1. Breast exam 06/27/2022: No abnormalities 2. Mammograms: 07/23/2021 at [facility]: Benign Her daughter Marzetta Board is getting married in October.  Return to clinic in 1 year for follow-up   No orders of the defined types were placed in this encounter.  The patient has a good understanding of the overall plan. she agrees with it. she will call with any problems that may develop before the next visit here. Total time spent: 30 mins including face to face time and time spent  for planning, charting and co-ordination of care   Harriette Ohara, MD 06/27/22    I Gardiner Coins am scribing for Dr. Lindi Adie  I have reviewed the above documentation for accuracy and completeness, and I agree with the above.

## 2022-07-29 DIAGNOSIS — Z1231 Encounter for screening mammogram for malignant neoplasm of breast: Secondary | ICD-10-CM | POA: Diagnosis not present

## 2022-10-06 DIAGNOSIS — Z23 Encounter for immunization: Secondary | ICD-10-CM | POA: Diagnosis not present

## 2022-10-20 DIAGNOSIS — S01511A Laceration without foreign body of lip, initial encounter: Secondary | ICD-10-CM | POA: Diagnosis not present

## 2022-10-20 DIAGNOSIS — E079 Disorder of thyroid, unspecified: Secondary | ICD-10-CM | POA: Diagnosis not present

## 2022-10-20 DIAGNOSIS — Z23 Encounter for immunization: Secondary | ICD-10-CM | POA: Diagnosis not present

## 2022-10-20 DIAGNOSIS — Z79899 Other long term (current) drug therapy: Secondary | ICD-10-CM | POA: Diagnosis not present

## 2022-10-20 DIAGNOSIS — S0993XA Unspecified injury of face, initial encounter: Secondary | ICD-10-CM | POA: Diagnosis not present

## 2022-10-20 DIAGNOSIS — Z853 Personal history of malignant neoplasm of breast: Secondary | ICD-10-CM | POA: Diagnosis not present

## 2022-12-29 DIAGNOSIS — D225 Melanocytic nevi of trunk: Secondary | ICD-10-CM | POA: Diagnosis not present

## 2022-12-29 DIAGNOSIS — Z1283 Encounter for screening for malignant neoplasm of skin: Secondary | ICD-10-CM | POA: Diagnosis not present

## 2023-01-29 ENCOUNTER — Encounter: Payer: Self-pay | Admitting: Internal Medicine

## 2023-01-29 NOTE — Progress Notes (Unsigned)
Subjective:    Patient ID: Shawna Marquez, female    DOB: September 07, 1941, 82 y.o.   MRN: [id number]     HPI Shawna Marquez is here for follow up of her chronic medical problems, including hypothyroidism,  Nov - ablabama pivoted and fell and hiface and tooh.  Her neck has  - whiplashe.  Turning head to elft   - feels pinching.    Left arm numbness - started in upper back - arm feels like it is ina vice - tightness - no tingling - feels numb.  Lasts about 20 seconds.  Happened 4- times in past 3 months.  Did not happern prior to fall.     Driving her earlier this week - right fingers tingling.    Never had neck pain.    No new weakness.  Left sid eof neck tender.    Walking for exercise - mile when she walks  Medications and allergies reviewed with patient and updated if appropriate.  Current Outpatient Medications on File Prior to Visit  Medication Sig Dispense Refill   aspirin EC 81 MG tablet Take 81 mg by mouth at bedtime. Reported on 02/25/2016     Calcium Citrate-Vitamin D (CALCIUM CITRATE + D3 PO) Take 1 tablet by mouth 2 (two) times daily. Calcium 500 mg, Vitamin D 800 units     cetirizine (ZYRTEC) 10 MG tablet Take 10 mg by mouth daily.     cholecalciferol (VITAMIN D) 1000 UNITS tablet Take 1,000 Units by mouth daily with lunch.      Coenzyme Q10 (COQ10) 200 MG CAPS Take 200 mg by mouth every other day.     exemestane (AROMASIN) 25 MG tablet TAKE 1 TABLET BY MOUTH EVERY DAY AFTER BREAKFAST 90 tablet 3   fluticasone (FLONASE) 50 MCG/ACT nasal spray SPRAY 2 SPRAYS INTO EACH NOSTRIL EVERY DAY 48 mL 3   folic acid (FOLVITE) A999333 MCG tablet Take 400 mcg by mouth daily with lunch.      hydrocortisone cream 1 % Apply 1 application topically daily as needed for itching (eczema).     levothyroxine (SYNTHROID) 125 MCG tablet Take 1 tablet (125 mcg total) by mouth daily before breakfast. 90 tablet 3   Multiple Vitamin (MULTIVITAMIN WITH MINERALS) TABS tablet Take 1 tablet by mouth daily with  lunch.     Omega-3 Fatty Acids (FISH OIL) 1000 MG CAPS Take 1,000 mg by mouth daily with lunch.     Turmeric 450 MG CAPS Take 450 mg by mouth daily with lunch.      vitamin B-12 (CYANOCOBALAMIN) 500 MCG tablet Take 500 mcg by mouth daily.     vitamin C (ASCORBIC ACID) 500 MG tablet Take 500 mg by mouth 2 (two) times daily.     No current facility-administered medications on file prior to visit.     Review of Systems  Respiratory:  Negative for shortness of breath.   Cardiovascular:  Negative for chest pain and palpitations.  Musculoskeletal:  Negative for neck pain.  Neurological:  Negative for dizziness, light-headedness and headaches.       Objective:   Vitals:   01/30/23 1539  BP: 130/80  Pulse: 73  Temp: 98.1 F (36.7 C)  SpO2: 96%   BP Readings from Last 3 Encounters:  01/30/23 130/80  06/27/22 140/73  12/24/21 136/78   Wt Readings from Last 3 Encounters:  01/30/23 160 lb (72.6 kg)  06/27/22 167 lb 8 oz (76 kg)  12/24/21 162 lb (73.5 kg)  Body mass index is 29.26 kg/m.    Physical Exam     Lab Results  Component Value Date   WBC 6.7 12/24/2021   HGB 14.3 12/24/2021   HCT 43.7 12/24/2021   PLT 165.0 12/24/2021   GLUCOSE 93 12/24/2021   CHOL 162 05/14/2021   TRIG 90.0 05/14/2021   HDL 52.10 05/14/2021   LDLCALC 92 05/14/2021   ALT 17 12/24/2021   AST 22 12/24/2021   NA 140 12/24/2021   K 4.3 12/24/2021   CL 101 12/24/2021   CREATININE 0.71 12/24/2021   BUN 17 12/24/2021   CO2 30 12/24/2021   TSH 0.88 12/24/2021     Assessment & Plan:    See Problem List for Assessment and Plan of chronic medical problems.

## 2023-01-29 NOTE — Patient Instructions (Addendum)
      Blood work was ordered.   Have an xray     Medications changes include :   none     Return in about 1 year (around 01/31/2024) for follow up.

## 2023-01-30 ENCOUNTER — Ambulatory Visit (INDEPENDENT_AMBULATORY_CARE_PROVIDER_SITE_OTHER): Payer: Medicare Other | Admitting: Internal Medicine

## 2023-01-30 ENCOUNTER — Ambulatory Visit (INDEPENDENT_AMBULATORY_CARE_PROVIDER_SITE_OTHER): Payer: Medicare Other

## 2023-01-30 ENCOUNTER — Encounter: Payer: Self-pay | Admitting: Internal Medicine

## 2023-01-30 VITALS — BP 130/80 | HR 73 | Temp 98.1°F | Ht 62.0 in | Wt 160.0 lb

## 2023-01-30 DIAGNOSIS — R200 Anesthesia of skin: Secondary | ICD-10-CM

## 2023-01-30 DIAGNOSIS — M79602 Pain in left arm: Secondary | ICD-10-CM | POA: Diagnosis not present

## 2023-01-30 DIAGNOSIS — E039 Hypothyroidism, unspecified: Secondary | ICD-10-CM

## 2023-01-30 DIAGNOSIS — C50211 Malignant neoplasm of upper-inner quadrant of right female breast: Secondary | ICD-10-CM

## 2023-01-30 LAB — CBC WITH DIFFERENTIAL/PLATELET
Basophils Absolute: 0.1 10*3/uL (ref 0.0–0.1)
Basophils Relative: 0.8 % (ref 0.0–3.0)
Eosinophils Absolute: 0.2 10*3/uL (ref 0.0–0.7)
Eosinophils Relative: 3.6 % (ref 0.0–5.0)
HCT: 43.8 % (ref 36.0–46.0)
Hemoglobin: 14.5 g/dL (ref 12.0–15.0)
Lymphocytes Relative: 28.9 % (ref 12.0–46.0)
Lymphs Abs: 1.8 10*3/uL (ref 0.7–4.0)
MCHC: 33.1 g/dL (ref 30.0–36.0)
MCV: 94.8 fl (ref 78.0–100.0)
Monocytes Absolute: 0.5 10*3/uL (ref 0.1–1.0)
Monocytes Relative: 8.3 % (ref 3.0–12.0)
Neutro Abs: 3.7 10*3/uL (ref 1.4–7.7)
Neutrophils Relative %: 58.4 % (ref 43.0–77.0)
Platelets: 202 10*3/uL (ref 150.0–400.0)
RBC: 4.62 Mil/uL (ref 3.87–5.11)
RDW: 12.8 % (ref 11.5–15.5)
WBC: 6.3 10*3/uL (ref 4.0–10.5)

## 2023-01-30 LAB — COMPREHENSIVE METABOLIC PANEL
ALT: 16 U/L (ref 0–35)
AST: 21 U/L (ref 0–37)
Albumin: 4.4 g/dL (ref 3.5–5.2)
Alkaline Phosphatase: 82 U/L (ref 39–117)
BUN: 19 mg/dL (ref 6–23)
CO2: 31 mEq/L (ref 19–32)
Calcium: 10.4 mg/dL (ref 8.4–10.5)
Chloride: 101 mEq/L (ref 96–112)
Creatinine, Ser: 0.77 mg/dL (ref 0.40–1.20)
GFR: 71.05 mL/min (ref >60.00)
Glucose, Bld: 75 mg/dL (ref 70–99)
Potassium: 4.5 mEq/L (ref 3.5–5.1)
Sodium: 141 mEq/L (ref 135–145)
Total Bilirubin: 0.5 mg/dL (ref 0.2–1.2)
Total Protein: 7.4 g/dL (ref 6.0–8.3)

## 2023-01-30 LAB — TSH: TSH: 0.5 u[IU]/mL (ref 0.35–5.50)

## 2023-01-30 MED ORDER — LEVOTHYROXINE SODIUM 125 MCG PO TABS: 125.0000 ug | ORAL_TABLET | Freq: Every day | ORAL | 3 refills | Status: DC

## 2023-01-31 DIAGNOSIS — R200 Anesthesia of skin: Secondary | ICD-10-CM | POA: Insufficient documentation

## 2023-01-31 DIAGNOSIS — M79602 Pain in left arm: Secondary | ICD-10-CM | POA: Insufficient documentation

## 2023-01-31 NOTE — Assessment & Plan Note (Signed)
Chronic  Clinically euthyroid Check tsh and will titrate med dose if needed Currently taking levothyroxine 125 mcg daily

## 2023-01-31 NOTE — Assessment & Plan Note (Signed)
New Intermittent episodes of left arm tightness/discomfort  - tightness No true pain More likely cervical radiculopathy, less likely cardiac in nature since no CP, SOB, Palps and denies symptoms with walking for exercise Possibly related to fall in November - had whiplash and may have some arthritis Cervical spine xray EKG - NSR 80 bpm, LAD, RBBB.  Compared to EKG from 2015 RBBB is no longer incomplete and LAD is new.  No infarct Consider CT CAC Consider PT or ortho referral

## 2023-02-02 ENCOUNTER — Encounter: Payer: Self-pay | Admitting: Internal Medicine

## 2023-02-02 DIAGNOSIS — I6529 Occlusion and stenosis of unspecified carotid artery: Secondary | ICD-10-CM

## 2023-02-05 ENCOUNTER — Ambulatory Visit (HOSPITAL_COMMUNITY)
Admission: RE | Admit: 2023-02-05 | Discharge: 2023-02-05 | Disposition: A | Discharge status: Home / Self Care | Payer: Medicare Other | Source: Ambulatory Visit | Attending: Internal Medicine | Admitting: Internal Medicine

## 2023-02-05 DIAGNOSIS — I6529 Occlusion and stenosis of unspecified carotid artery: Secondary | ICD-10-CM | POA: Diagnosis not present

## 2023-02-12 ENCOUNTER — Encounter: Payer: Self-pay | Admitting: Internal Medicine

## 2023-02-12 ENCOUNTER — Other Ambulatory Visit: Payer: Self-pay

## 2023-02-12 DIAGNOSIS — J301 Allergic rhinitis due to pollen: Secondary | ICD-10-CM

## 2023-02-12 MED ORDER — FLUTICASONE PROPIONATE 50 MCG/ACT NA SUSP: NASAL | 3 refills | Status: DC

## 2023-04-20 DIAGNOSIS — H43813 Vitreous degeneration, bilateral: Secondary | ICD-10-CM | POA: Diagnosis not present

## 2023-04-20 DIAGNOSIS — H26491 Other secondary cataract, right eye: Secondary | ICD-10-CM | POA: Diagnosis not present

## 2023-04-20 DIAGNOSIS — H02834 Dermatochalasis of left upper eyelid: Secondary | ICD-10-CM | POA: Diagnosis not present

## 2023-04-20 DIAGNOSIS — H524 Presbyopia: Secondary | ICD-10-CM | POA: Diagnosis not present

## 2023-04-20 DIAGNOSIS — H02831 Dermatochalasis of right upper eyelid: Secondary | ICD-10-CM | POA: Diagnosis not present

## 2023-04-20 DIAGNOSIS — H52203 Unspecified astigmatism, bilateral: Secondary | ICD-10-CM | POA: Diagnosis not present

## 2023-04-20 DIAGNOSIS — H04123 Dry eye syndrome of bilateral lacrimal glands: Secondary | ICD-10-CM | POA: Diagnosis not present

## 2023-04-20 DIAGNOSIS — H0100A Unspecified blepharitis right eye, upper and lower eyelids: Secondary | ICD-10-CM | POA: Diagnosis not present

## 2023-04-22 NOTE — Progress Notes (Unsigned)
[facility] [address] [place] [address] Phone: [phone] Subjective:   Shawna Marquez, am serving as a scribe for Dr. Antoine Primas.  I'm seeing this patient by the request  of:  [name], MD  CC: Foot pain follow-up  [id number]  Shawna Marquez is a 82 y.o. female coming in with complaint of R foot pain. Patient states that her first toe has a bunion and second toe seem to be hammering. Pain is intermittent. Wearing orthotics and her pain is worse when she does not wear them. Pain worse when she is on her feet all day.        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   Complication of anesthesia    bp will drop,hard to wake up   DIVERTICULITIS, HX OF 10/12/2007   Family history of breast cancer    HYPOTHYROIDISM, PRIMARY 10/12/2007   OSTEOARTHRITIS 09/09/2007   back    Radiation 04/10/15-05/25/15   Right Breast   Sleep apnea    USES cpap NIGHTLY   Wears glasses    Past Surgical History:  Procedure Laterality Date   ABDOMINAL HYSTERECTOMY  1977   COLON SURGERY  2006   part colectomy   COLONOSCOPY  2007   colovaginal fistula  2007   colon resection   EYE SURGERY     both cataracts   HERNIA REPAIR  2007   ventral   MASTECTOMY Left 1997   lt mast-axillary node dissection   PORTACATH PLACEMENT Left 01/01/2015   Procedure: INSERTION PORT-A-CATH;  Surgeon: Emelia Loron, MD;  Location: [facility] OR;  Service: General;  Laterality: Left;   RADIOACTIVE SEED GUIDED PARTIAL MASTECTOMY WITH AXILLARY SENTINEL LYMPH NODE BIOPSY Right 11/28/2014   Procedure: RADIOACTIVE SEED GUIDED RIGHT LUMPECTOMY WITH RIGHT AXILLARY SENTINEL LYMPH NODE BIOPSY;  Surgeon: Emelia Loron, MD;  Location: [facility];  Service: General;  Laterality: Right;   RE-EXCISION OF BREAST  CANCER,SUPERIOR MARGINS Right 01/01/2015   Procedure: RIGHT BREAST MARGIN EXCISION;  Surgeon: Emelia Loron, MD;  Location: [facility] OR;  Service: General;  Laterality: Right;   TONSILLECTOMY     WISDOM TOOTH EXTRACTION     Social History   Socioeconomic History   Marital status: Married    Spouse name: Not on file   Number of children: 3   Years of education: Not on file   Highest education level: Not on file  Occupational History   Occupation: retired  Tobacco Use   Smoking status: Never   Smokeless tobacco: Never  Substance and Sexual Activity   Alcohol use: Yes    Alcohol/week: 0.0 standard drinks of alcohol    Comment: occassional beer   Drug use: No   Sexual activity: Yes  Other Topics Concern   Not on file  Social History Narrative   Not on file   Social Determinants of Health   Financial Resource Strain: Low Risk  (04/16/2022)   Overall Financial Resource Strain (CARDIA)    Difficulty of Paying Living Expenses: Not hard at all  Food Insecurity: No Food Insecurity (04/16/2022)   Hunger Vital Sign    Worried About Running Out of Food in the Last Year: Never true    Ran Out of Food in the Last Year: Never true  Transportation Needs: No Transportation  Needs (04/16/2022)   PRAPARE - Administrator, Civil Service (Medical): No    Lack of Transportation (Non-Medical): No  Physical Activity: Sufficiently Active (04/16/2022)   Exercise Vital Sign    Days of Exercise per Week: 5 days    Minutes of Exercise per Session: 30 min  Stress: No Stress Concern Present (04/16/2022)   [employer] of Occupational Health - Occupational Stress Questionnaire    Feeling of Stress : Not at all  Social Connections: Socially Integrated (04/16/2022)   Social Connection and Isolation Panel [NHANES]    Frequency of Communication with Friends and Family: More than three times a week    Frequency of Social Gatherings with Friends and Family: More than three times a week    Attends  Religious Services: 1 to 4 times per year    Active Member of [employer] or Organizations: Yes    Attends Banker Meetings: 1 to 4 times per year    Marital Status: Married   No Known Allergies Family History  Problem Relation Age of Onset   Breast cancer Paternal Grandmother 33   Lung cancer Father 61       smoker   Prostate cancer Cousin        paternal cousin    Heart disease Neg Hx        family hx   Cancer Neg Hx        lung ca   COPD Neg Hx        family hx   Colon cancer Neg Hx     Current Outpatient Medications (Endocrine & Metabolic):    levothyroxine (SYNTHROID) 125 MCG tablet, Take 1 tablet (125 mcg total) by mouth daily before breakfast.   Current Outpatient Medications (Respiratory):    cetirizine (ZYRTEC) 10 MG tablet, Take 10 mg by mouth daily.   fluticasone (FLONASE) 50 MCG/ACT nasal spray, SPRAY 2 SPRAYS INTO EACH NOSTRIL EVERY DAY  Current Outpatient Medications (Analgesics):    aspirin EC 81 MG tablet, Take 81 mg by mouth at bedtime. Reported on 02/25/2016  Current Outpatient Medications (Hematological):    folic acid (FOLVITE) 400 MCG tablet, Take 400 mcg by mouth daily with lunch.    vitamin B-12 (CYANOCOBALAMIN) 500 MCG tablet, Take 500 mcg by mouth daily.  Current Outpatient Medications (Other):    Calcium Citrate-Vitamin D (CALCIUM CITRATE + D3 PO), Take 1 tablet by mouth 2 (two) times daily. Calcium 500 mg, Vitamin D 800 units   cholecalciferol (VITAMIN D) 1000 UNITS tablet, Take 1,000 Units by mouth daily with lunch.    Coenzyme Q10 (COQ10) 200 MG CAPS, Take 200 mg by mouth every other day.   exemestane (AROMASIN) 25 MG tablet, TAKE 1 TABLET BY MOUTH EVERY DAY AFTER BREAKFAST   hydrocortisone cream 1 %, Apply 1 application topically daily as needed for itching (eczema).   Multiple Vitamin (MULTIVITAMIN WITH MINERALS) TABS tablet, Take 1 tablet by mouth daily with lunch.   Omega-3 Fatty Acids (FISH OIL) 1000 MG CAPS, Take 1,000 mg by mouth  daily with lunch.   Turmeric 450 MG CAPS, Take 450 mg by mouth daily with lunch.    vitamin C (ASCORBIC ACID) 500 MG tablet, Take 500 mg by mouth 2 (two) times daily.   Reviewed prior external information including notes and imaging from  primary care provider As well as notes that were available from care everywhere and other healthcare systems.  Past medical history, social, surgical and family history all reviewed  in electronic medical record.  No pertanent information unless stated regarding to the chief complaint.   Review of Systems:  No headache, visual changes, nausea, vomiting, diarrhea, constipation, dizziness, abdominal pain, skin rash, fevers, chills, night sweats, weight loss, swollen lymph nodes, body aches, joint swelling, chest pain, shortness of breath, mood changes. POSITIVE muscle aches  Objective  Blood pressure 122/76, pulse 75, height 5\' 2"  (1.575 m), weight 160 lb (72.6 kg), SpO2 98 %.   General: No apparent distress alert and oriented x3 mood and affect normal, dressed appropriately.  HEENT: Pupils equal, extraocular movements intact  Respiratory: Patient's speak in full sentences and does not appear short of breath  Cardiovascular: No lower extremity edema, non tender, no erythema  Foot exam does have significant breakdown of the transverse arch noted with bunion and bunionette formation.  Patient does have tenderness over the bunion itself of the big toe.  Some hallux limitus noted.  Overpronation of the hindfoot noted.    Impression and Recommendations:     The above documentation has been reviewed and is accurate and complete Judi Saa, DO

## 2023-04-23 ENCOUNTER — Ambulatory Visit: Payer: Self-pay

## 2023-04-23 ENCOUNTER — Ambulatory Visit (INDEPENDENT_AMBULATORY_CARE_PROVIDER_SITE_OTHER): Payer: Medicare Other | Admitting: Family Medicine

## 2023-04-23 VITALS — BP 122/76 | HR 75 | Ht 62.0 in | Wt 160.0 lb

## 2023-04-23 DIAGNOSIS — M216X1 Other acquired deformities of right foot: Secondary | ICD-10-CM

## 2023-04-23 DIAGNOSIS — M79672 Pain in left foot: Secondary | ICD-10-CM

## 2023-04-23 DIAGNOSIS — M216X9 Other acquired deformities of unspecified foot: Secondary | ICD-10-CM | POA: Insufficient documentation

## 2023-04-23 DIAGNOSIS — M79671 Pain in right foot: Secondary | ICD-10-CM

## 2023-04-23 NOTE — Assessment & Plan Note (Signed)
Crazy breakdown of the transverse arch noted.  Discussed with patient to continue to use the over-the-counter orthotics, home exercises given, which activities to do and which ones to avoid.  Increase activity slowly over the course of next several weeks.  Follow-up with me again in 6 to 8 weeks.  We discussed shoe choices as well

## 2023-04-23 NOTE — Patient Instructions (Signed)
HOKA bondi Do not lace last eye  Can see me in 2-3 months if needed

## 2023-04-28 ENCOUNTER — Ambulatory Visit (INDEPENDENT_AMBULATORY_CARE_PROVIDER_SITE_OTHER): Payer: Medicare Other | Admitting: Internal Medicine

## 2023-04-28 ENCOUNTER — Encounter: Payer: Self-pay | Admitting: Internal Medicine

## 2023-04-28 VITALS — BP 114/66 | HR 77 | Ht 61.5 in | Wt 164.6 lb

## 2023-04-28 DIAGNOSIS — K117 Disturbances of salivary secretion: Secondary | ICD-10-CM | POA: Diagnosis not present

## 2023-04-28 DIAGNOSIS — G4733 Obstructive sleep apnea (adult) (pediatric): Secondary | ICD-10-CM

## 2023-04-28 NOTE — Patient Instructions (Signed)
Order- DME [facility]- please replace old CPAP machine, auto 5-12, mask of choice, humidifier, supplies, AirView/ card  Try turning up your CPAP humidifier a bit to see if that helps morning dry mouth.  Try swishing your mouth with Biotene mouth wash at bedtime to help dryness.  Make sure you are drinking enough water.

## 2023-04-28 NOTE — Progress Notes (Signed)
HPI female never smoker followed for OSA, complicated by allergic rhinitis, history of right breast cancer, hypothyroid NPSG 04/21/11- AHI 47/ hr, desaturation to 85%, CPAP titrated to 8  -----------------------------------------------------------------------------------------------------------  33/221/22-82 year old female never smoker followed for OSA, complicated by allergic rhinitis, history of right breast cancer, hypothyroid CPAP auto 5-12/ [facility] Download- compliance 100%, AHI 0.8/ hr Body weight today-170 lbs Covid vax- -----Reports no issues with sleep or breathing Current nasal masks don't last as long but otherwise doing ok. Gets up to help ill husband at night.  Breast cancer treatment ok- 1 more year.  04/28/23- 82 year old female never smoker followed for OSA, complicated by allergic rhinitis, history of right breast cancer, hypothyroid CPAP auto 5-12/ [facility]     replaced 2019 Download- compliance 100%, AHI 1/ hr Body weight today- 164 lbs Download reviewed.  She has been noticing dry mouth for the last 5 or 6 weeks and we discussed possible relation to weather/humidity/air conditioning.  Discussed adjustment of humidifier and trial of Biotene mouthwash. She is eligible to replace old machine.  ROS-see HPI + = positive Constitutional:    weight loss, night sweats, fevers, chills, fatigue, lassitude. HEENT:    headaches, difficulty swallowing, tooth/dental problems, sore throat,       Sneezing,+ itching, ear ache, +nasal congestion, post nasal drip, snoring CV:    chest pain, orthopnea, PND, swelling in lower extremities, anasarca,                                               dizziness, palpitations Resp:   shortness of breath with exertion or at rest.                productive cough,   non-productive cough, coughing up of blood.              change in color of mucus.  wheezing.   Skin:    rash or lesions. GI:  No-   heartburn, indigestion, abdominal pain, nausea, vomiting,  diarrhea,                 change in bowel habits, loss of appetite GU: dysuria, change in color of urine, no urgency or frequency.   flank pain. MS:   joint pain, stiffness, decreased range of motion, back pain. Neuro-     nothing unusual Psych:  change in mood or affect.  depression or anxiety.   memory loss.  OBJ- Physical Exam General- Alert, Oriented, Affect-appropriate, Distress- none acute + overweight Skin- rash-none, lesions- none, excoriation- none Lymphadenopathy- none Head- atraumatic            Eyes- Gross vision intact, PERRLA, conjunctivae and secretions clear            Ears- Hearing, canals-normal            Nose- Clear, no-Septal dev, mucus, polyps, erosion, perforation             Throat- Mallampati IV , mucosa+dry , drainage- none, tonsils- atrophic Neck- flexible , trachea midline, no stridor , thyroid nl, carotid no bruit Chest - symmetrical excursion , unlabored           Heart/CV- RRR , no murmur , no gallop  , no rub, nl s1 s2                           -  JVD- none , edema- none, stasis changes- none, varices- none           Lung- clear to P&A, wheeze- none, cough- none , dullness-none, rub- none           Chest wall-  +Mastectomy L Abd-  Br/ Gen/ Rectal- Not done, not indicated Extrem- cyanosis- none, clubbing, none, atrophy- none, strength- nl Neuro- grossly intact to observation

## 2023-05-08 ENCOUNTER — Ambulatory Visit (INDEPENDENT_AMBULATORY_CARE_PROVIDER_SITE_OTHER): Payer: Medicare Other

## 2023-05-08 VITALS — Ht 60.5 in | Wt 159.0 lb

## 2023-05-08 DIAGNOSIS — Z0000 Encounter for general adult medical examination without abnormal findings: Secondary | ICD-10-CM

## 2023-05-08 NOTE — Progress Notes (Signed)
I connected with  Shawna Marquez on 05/08/23 by a audio enabled telemedicine application and verified that I am speaking with the correct person using two identifiers.  Patient Location: Home  Provider Location: Office/Clinic  I discussed the limitations of evaluation and management by telemedicine. The patient expressed understanding and agreed to proceed.  Subjective:   Shawna Marquez is a 82 y.o. female who presents for Medicare Annual (Subsequent) preventive examination.  Patient Medicare AWV questionnaire was completed by the patient on 05/07/2023; I have confirmed that all information answered by patient is correct and no changes since this date.    Review of Systems     Cardiac Risk Factors include: advanced age (>5men, >36 women);obesity (BMI >30kg/m2)     Objective:    Today's Vitals   05/08/23 1426  Weight: 159 lb (72.1 kg)  Height: 5' 0.5" (1.537 m)   Body mass index is 30.54 kg/m.     05/08/2023    2:33 PM 04/16/2022    1:06 PM 12/11/2020    9:49 AM 02/09/2019    9:00 AM 12/11/2016    2:06 PM 08/30/2015   12:09 PM 05/31/2015    9:06 AM  Advanced Directives  Does Patient Have a Medical Advance Directive? Yes Yes Yes Yes No Yes Yes  Type of Estate agent of [place];Living will Living will;Healthcare Power of Attorney Living will;Healthcare Power of [employer] Power of [place];Living will  Healthcare Power of [employer] Power of Attorney  Does patient want to make changes to medical advance directive?  No - Patient declined  No - Patient declined     Copy of Healthcare Power of Attorney in Chart? No - copy requested No - copy requested No - copy requested No - copy requested  No - copy requested     Current Medications (verified) Outpatient Encounter Medications as of 05/08/2023  Medication Sig   aspirin EC 81 MG tablet Take 81 mg by mouth at bedtime. Every other day   Calcium Citrate-Vitamin D (CALCIUM CITRATE + D3 PO) Take 1  tablet by mouth 2 (two) times daily. Calcium 500 mg, Vitamin D 800 units   cetirizine (ZYRTEC) 10 MG tablet Take 10 mg by mouth daily.   cholecalciferol (VITAMIN D) 1000 UNITS tablet Take 1,000 Units by mouth daily with lunch.    Coenzyme Q10 (COQ10) 200 MG CAPS Take 200 mg by mouth every other day.   exemestane (AROMASIN) 25 MG tablet TAKE 1 TABLET BY MOUTH EVERY DAY AFTER BREAKFAST   fluticasone (FLONASE) 50 MCG/ACT nasal spray SPRAY 2 SPRAYS INTO EACH NOSTRIL EVERY DAY   folic acid (FOLVITE) 400 MCG tablet Take 400 mcg by mouth daily with lunch.    hydrocortisone cream 1 % Apply 1 application topically daily as needed for itching (eczema).   levothyroxine (SYNTHROID) 125 MCG tablet Take 1 tablet (125 mcg total) by mouth daily before breakfast.   Multiple Vitamin (MULTIVITAMIN WITH MINERALS) TABS tablet Take 1 tablet by mouth daily with lunch.   Omega-3 Fatty Acids (FISH OIL) 1000 MG CAPS Take 1,000 mg by mouth daily with lunch.   Turmeric 450 MG CAPS Take 450 mg by mouth daily with lunch.    vitamin B-12 (CYANOCOBALAMIN) 500 MCG tablet Take 500 mcg by mouth daily.   vitamin C (ASCORBIC ACID) 500 MG tablet Take 500 mg by mouth 2 (two) times daily.   No facility-administered encounter medications on file as of 05/08/2023.    Allergies (verified) Patient has no  known allergies.   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   Complication of anesthesia    bp will drop,hard to wake up   DIVERTICULITIS, HX OF 10/12/2007   Family history of breast cancer    HYPOTHYROIDISM, PRIMARY 10/12/2007   OSTEOARTHRITIS 09/09/2007   back    Radiation 04/10/15-05/25/15   Right Breast   Sleep apnea    USES cpap NIGHTLY   Wears glasses    Past Surgical History:  Procedure Laterality Date   ABDOMINAL HYSTERECTOMY  1977   COLON SURGERY   2006   part colectomy   COLONOSCOPY  2007   colovaginal fistula  2007   colon resection   EYE SURGERY     both cataracts   HERNIA REPAIR  2007   ventral   MASTECTOMY Left 1997   lt mast-axillary node dissection   PORTACATH PLACEMENT Left 01/01/2015   Procedure: INSERTION PORT-A-CATH;  Surgeon: Emelia Loron, MD;  Location: [facility] OR;  Service: General;  Laterality: Left;   RADIOACTIVE SEED GUIDED PARTIAL MASTECTOMY WITH AXILLARY SENTINEL LYMPH NODE BIOPSY Right 11/28/2014   Procedure: RADIOACTIVE SEED GUIDED RIGHT LUMPECTOMY WITH RIGHT AXILLARY SENTINEL LYMPH NODE BIOPSY;  Surgeon: Emelia Loron, MD;  Location: [facility];  Service: General;  Laterality: Right;   RE-EXCISION OF BREAST CANCER,SUPERIOR MARGINS Right 01/01/2015   Procedure: RIGHT BREAST MARGIN EXCISION;  Surgeon: Emelia Loron, MD;  Location: [facility] OR;  Service: General;  Laterality: Right;   TONSILLECTOMY     WISDOM TOOTH EXTRACTION     Family History  Problem Relation Age of Onset   Breast cancer Paternal Grandmother 8   Lung cancer Father 55       smoker   Prostate cancer Cousin        paternal cousin    Heart disease Neg Hx        family hx   Cancer Neg Hx        lung ca   COPD Neg Hx        family hx   Colon cancer Neg Hx    Social History   Socioeconomic History   Marital status: Married    Spouse name: Not on file   Number of children: 3   Years of education: Not on file   Highest education level: Not on file  Occupational History   Occupation: retired  Tobacco Use   Smoking status: Never   Smokeless tobacco: Never  Vaping Use   Vaping Use: Never used  Substance and Sexual Activity   Alcohol use: Yes    Alcohol/week: 0.0 standard drinks of alcohol    Comment: occassional beer   Drug use: No   Sexual activity: Yes  Other Topics Concern   Not on file  Social History Narrative   Not on file   Social Determinants of Health   Financial Resource Strain: Low Risk   (05/07/2023)   Overall Financial Resource Strain (CARDIA)    Difficulty of Paying Living Expenses: Not hard at all  Food Insecurity: No Food Insecurity (05/07/2023)   Hunger Vital Sign    Worried About Running Out of Food in the Last Year: Never true    Ran Out of Food in the Last Year: Never true  Transportation Needs: No Transportation Needs (05/07/2023)   PRAPARE - Transportation  Lack of Transportation (Medical): No    Lack of Transportation (Non-Medical): No  Physical Activity: Insufficiently Active (05/07/2023)   Exercise Vital Sign    Days of Exercise per Week: 4 days    Minutes of Exercise per Session: 20 min  Stress: No Stress Concern Present (05/07/2023)   [employer] of Occupational Health - Occupational Stress Questionnaire    Feeling of Stress : Not at all  Social Connections: Unknown (05/07/2023)   Social Connection and Isolation Panel [NHANES]    Frequency of Communication with Friends and Family: More than three times a week    Frequency of Social Gatherings with Friends and Family: Three times a week    Attends Religious Services: Not on file    Active Member of Clubs or Organizations: Yes    Attends Banker Meetings: More than 4 times per year    Marital Status: Married    Tobacco Counseling Counseling given: Not Answered   Clinical Intake:  Pre-visit preparation completed: Yes  Pain : No/denies pain     Nutritional Status: BMI > 30  Obese Nutritional Risks: None Diabetes: No  How often do you need to have someone help you when you read instructions, pamphlets, or other written materials from your doctor or pharmacy?: 1 - Never  Diabetic? no  Interpreter Needed?: No  Information entered by :: NAllen LPN   Activities of Daily Living    05/07/2023    2:01 PM 05/04/2023   10:13 AM  In your present state of health, do you have any difficulty performing the following activities:  Hearing? 0 0  Vision? 0 0  Difficulty concentrating  or making decisions? 0 0  Walking or climbing stairs? 0 0  Dressing or bathing? 0 0  Doing errands, shopping? 0 0  Preparing Food and eating ? N N  Using the Toilet? N N  In the past six months, have you accidently leaked urine? N N  Do you have problems with loss of bowel control? N N  Managing your Medications? N N  Managing your Finances? N N  Housekeeping or managing your Housekeeping? N N    Patient Care Team: [name], MD as PCP - General (Internal Medicine) Emelia Loron, MD as Consulting Physician (General Surgery) Serena Croissant, MD as Consulting Physician (Hematology and Oncology) Lurline Hare, MD as Consulting Physician (Radiation Oncology) Hubbard Hartshorn, NP (Inactive) as Nurse Practitioner (Nurse Practitioner) Salomon Fick, NP as Nurse Practitioner (Nurse Practitioner) Janet Berlin, MD as Consulting Physician (Ophthalmology) Nita Sells, MD as Consulting Physician (Dermatology) Waymon Budge, MD as Consulting Physician (Pulmonary Disease)  Indicate any recent Medical Services you may have received from other than [facility] providers in the past year (date may be approximate).     Assessment:   This is a routine wellness examination for [place].  Hearing/Vision screen Vision Screening - Comments:: Regular eye exams,  Opth  Dietary issues and exercise activities discussed: Current Exercise Habits: Structured exercise class, Type of exercise: Other - see comments (water walk), Time (Minutes): 20, Frequency (Times/Week): 4, Weekly Exercise (Minutes/Week): 80   Goals Addressed             This Visit's Progress    Patient Stated       05/08/2023, wants to join study        Depression Screen    05/08/2023    2:35 PM 01/30/2023    3:39 PM 04/16/2022    1:10 PM 12/24/2021  12:27 PM 12/11/2020    9:52 AM 05/23/2020    8:27 AM 02/09/2019    9:00 AM  PHQ 2/9 Scores  PHQ - 2 Score 0 0 0 0 0 0 0  PHQ- 9 Score    0       Fall  Risk    05/07/2023    2:01 PM 05/04/2023   10:13 AM 01/30/2023    3:39 PM 04/16/2022    1:21 PM 12/24/2021   11:35 AM  Fall Risk   Falls in the past year? 1 1 1  0 0  Comment slipped      Number falls in past yr: 0 0 0 0 0  Injury with Fall? 1 1 1  0 0  Comment teeth split mouth      Risk for fall due to : Medication side effect  History of fall(s) No Fall Risks No Fall Risks  Follow up Falls prevention discussed;Education provided;Falls evaluation completed  Falls evaluation completed Falls evaluation completed Falls evaluation completed    FALL RISK PREVENTION PERTAINING TO THE HOME:  Any stairs in or around the home? Yes  If so, are there any without handrails? No  Home free of loose throw rugs in walkways, pet beds, electrical cords, etc? Yes  Adequate lighting in your home to reduce risk of falls? Yes   ASSISTIVE DEVICES UTILIZED TO PREVENT FALLS:  Life alert? No  Use of a cane, walker or w/c? No  Grab bars in the bathroom? Yes  Shower chair or bench in shower? Yes  Elevated toilet seat or a handicapped toilet? Yes   TIMED UP AND GO:  Was the test performed? No .      Cognitive Function:        05/08/2023    2:36 PM 04/16/2022    1:16 PM 12/11/2020    9:58 AM  6CIT Screen  What Year? 0 points 0 points 0 points  What month? 0 points 0 points 0 points  What time? 0 points 0 points 0 points  Count back from 20 0 points 0 points 0 points  Months in reverse 0 points 0 points 0 points  Repeat phrase 0 points 0 points 0 points  Total Score 0 points 0 points 0 points    Immunizations Immunization History  Administered Date(s) Administered   Fluad Quad(high Dose 65+) 10/18/2019, 10/24/2020, 10/06/2022   Influenza Split 10/10/2013   Influenza Whole 09/07/2000   Influenza, High Dose Seasonal PF 10/27/2014, 09/27/2015, 10/09/2016, 09/24/2017, 08/28/2021   Influenza-Unspecified 10/15/2018   PFIZER(Purple Top)SARS-COV-2 Vaccination 01/28/2020, 02/21/2020, 10/03/2020    Pfizer Covid-19 Vaccine Bivalent Booster 64yrs & up 10/28/2021   Pneumococcal Conjugate-13 02/24/2014   Pneumococcal Polysaccharide-23 02/06/2004   Td 03/18/1996   Tdap 02/08/2013, 10/20/2022    TDAP status: Up to date  Flu Vaccine status: Up to date  Pneumococcal vaccine status: Up to date  Covid-19 vaccine status: Completed vaccines  Qualifies for Shingles Vaccine? Yes   Zostavax completed No   Shingrix Completed?: No.    Education has been provided regarding the importance of this vaccine. Patient has been advised to call insurance company to determine out of pocket expense if they have not yet received this vaccine. Advised may also receive vaccine at local pharmacy or Health Dept. Verbalized acceptance and understanding.  Screening Tests Health Maintenance  Topic Date Due   Zoster Vaccines- Shingrix (1 of 2) Never done   COVID-19 Vaccine (5 - 2023-24 season) 08/08/2022   Medicare Annual Wellness (AWV)  04/17/2023   INFLUENZA VACCINE  07/09/2023   DEXA SCAN  07/24/2023   MAMMOGRAM  07/30/2023   DTaP/Tdap/Td (4 - Td or Tdap) 10/20/2032   Pneumonia Vaccine 16+ Years old  Completed   HPV VACCINES  Aged Out    Health Maintenance  Health Maintenance Due  Topic Date Due   Zoster Vaccines- Shingrix (1 of 2) Never done   COVID-19 Vaccine (5 - 2023-24 season) 08/08/2022   Medicare Annual Wellness (AWV)  04/17/2023    Colorectal cancer screening: No longer required.   Mammogram status: Completed 07/29/2022. Repeat every year  Bone Density status: Completed 07/23/2021.   Lung Cancer Screening: (Low Dose CT Chest recommended if Age 50-80 years, 30 pack-year currently smoking OR have quit w/in 15years.) does not qualify.   Lung Cancer Screening Referral: no  Additional Screening:  Hepatitis C Screening: does not qualify;   Vision Screening: Recommended annual ophthalmology exams for early detection of glaucoma and other disorders of the eye. Is the patient up to date  with their annual eye exam?  Yes  Who is the provider or what is the name of the office in which the patient attends annual eye exams? [facility] If pt is not established with a provider, would they like to be referred to a provider to establish care? No .   Dental Screening: Recommended annual dental exams for proper oral hygiene  Community Resource Referral / Chronic Care Management: CRR required this visit?  No   CCM required this visit?  No      Plan:     I have personally reviewed and noted the following in the patient's chart:   Medical and social history Use of alcohol, tobacco or illicit drugs  Current medications and supplements including opioid prescriptions. Patient is not currently taking opioid prescriptions. Functional ability and status Nutritional status Physical activity Advanced directives List of other physicians Hospitalizations, surgeries, and ER visits in previous 12 months Vitals Screenings to include cognitive, depression, and falls Referrals and appointments  In addition, I have reviewed and discussed with patient certain preventive protocols, quality metrics, and best practice recommendations. A written personalized care plan for preventive services as well as general preventive health recommendations were provided to patient.     Barb Merino, LPN   1/61/0960   Nurse Notes: none  Due to this being a virtual visit, the after visit summary with patients personalized plan was offered to patient via mail or my-chart.  Patient would like to access on my-chart

## 2023-05-08 NOTE — Patient Instructions (Signed)
Shawna Marquez , Thank you for taking time to come for your Medicare Wellness Visit. I appreciate your ongoing commitment to your health goals. Please review the following plan we discussed and let me know if I can assist you in the future.   These are the goals we discussed:  Goals      Maintain current healthy lifestyle.     My goal is to lose about another 10 pounds by staying physically active with water aerobics.     Patient Stated     05/08/2023, wants to join study         This is a list of the screening recommended for you and due dates:  Health Maintenance  Topic Date Due   Zoster (Shingles) Vaccine (1 of 2) Never done   COVID-19 Vaccine (5 - 2023-24 season) 08/08/2022   Flu Shot  07/09/2023   DEXA scan (bone density measurement)  07/24/2023   Mammogram  07/30/2023   Medicare Annual Wellness Visit  05/07/2024   DTaP/Tdap/Td vaccine (4 - Td or Tdap) 10/20/2032   Pneumonia Vaccine  Completed   HPV Vaccine  Aged Out    Advanced directives: Please bring a copy of your POA (Power of [place]) and/or Living Will to your next appointment.   Conditions/risks identified: none  Next appointment: Follow up in one year for your annual wellness visit    Preventive Care 65 Years and Older, Female Preventive care refers to lifestyle choices and visits with your health care provider that can promote health and wellness. What does preventive care include? A yearly physical exam. This is also called an annual well check. Dental exams once or twice a year. Routine eye exams. Ask your health care provider how often you should have your eyes checked. Personal lifestyle choices, including: Daily care of your teeth and gums. Regular physical activity. Eating a healthy diet. Avoiding tobacco and drug use. Limiting alcohol use. Practicing safe sex. Taking low-dose aspirin every day. Taking vitamin and mineral supplements as recommended by your health care provider. What happens during an  annual well check? The services and screenings done by your health care provider during your annual well check will depend on your age, overall health, lifestyle risk factors, and family history of disease. Counseling  Your health care provider may ask you questions about your: Alcohol use. Tobacco use. Drug use. Emotional well-being. Home and relationship well-being. Sexual activity. Eating habits. History of falls. Memory and ability to understand (cognition). Work and work Astronomer. Reproductive health. Screening  You may have the following tests or measurements: Height, weight, and BMI. Blood pressure. Lipid and cholesterol levels. These may be checked every 5 years, or more frequently if you are over 68 years old. Skin check. Lung cancer screening. You may have this screening every year starting at age 80 if you have a 30-pack-year history of smoking and currently smoke or have quit within the past 15 years. Fecal occult blood test (FOBT) of the stool. You may have this test every year starting at age 46. Flexible sigmoidoscopy or colonoscopy. You may have a sigmoidoscopy every 5 years or a colonoscopy every 10 years starting at age 60. Hepatitis C blood test. Hepatitis B blood test. Sexually transmitted disease (STD) testing. Diabetes screening. This is done by checking your blood sugar (glucose) after you have not eaten for a while (fasting). You may have this done every 1-3 years. Bone density scan. This is done to screen for osteoporosis. You may have this done  starting at age 84. Mammogram. This may be done every 1-2 years. Talk to your health care provider about how often you should have regular mammograms. Talk with your health care provider about your test results, treatment options, and if necessary, the need for more tests. Vaccines  Your health care provider may recommend certain vaccines, such as: Influenza vaccine. This is recommended every year. Tetanus,  diphtheria, and acellular pertussis (Tdap, Td) vaccine. You may need a Td booster every 10 years. Zoster vaccine. You may need this after age 37. Pneumococcal 13-valent conjugate (PCV13) vaccine. One dose is recommended after age 62. Pneumococcal polysaccharide (PPSV23) vaccine. One dose is recommended after age 78. Talk to your health care provider about which screenings and vaccines you need and how often you need them. This information is not intended to replace advice given to you by your health care provider. Make sure you discuss any questions you have with your health care provider. Document Released: 12/21/2015 Document Revised: 08/13/2016 Document Reviewed: 09/25/2015 Elsevier Interactive Patient Education  2017 [employer].  Fall Prevention in the Home Falls can cause injuries. They can happen to people of all ages. There are many things you can do to make your home safe and to help prevent falls. What can I do on the outside of my home? Regularly fix the edges of walkways and driveways and fix any cracks. Remove anything that might make you trip as you walk through a door, such as a raised step or threshold. Trim any bushes or trees on the path to your home. Use bright outdoor lighting. Clear any walking paths of anything that might make someone trip, such as rocks or tools. Regularly check to see if handrails are loose or broken. Make sure that both sides of any steps have handrails. Any raised decks and porches should have guardrails on the edges. Have any leaves, snow, or ice cleared regularly. Use sand or salt on walking paths during winter. Clean up any spills in your garage right away. This includes oil or grease spills. What can I do in the bathroom? Use night lights. Install grab bars by the toilet and in the tub and shower. Do not use towel bars as grab bars. Use non-skid mats or decals in the tub or shower. If you need to sit down in the shower, use a plastic,  non-slip stool. Keep the floor dry. Clean up any water that spills on the floor as soon as it happens. Remove soap buildup in the tub or shower regularly. Attach bath mats securely with double-sided non-slip rug tape. Do not have throw rugs and other things on the floor that can make you trip. What can I do in the bedroom? Use night lights. Make sure that you have a light by your bed that is easy to reach. Do not use any sheets or blankets that are too big for your bed. They should not hang down onto the floor. Have a firm chair that has side arms. You can use this for support while you get dressed. Do not have throw rugs and other things on the floor that can make you trip. What can I do in the kitchen? Clean up any spills right away. Avoid walking on wet floors. Keep items that you use a lot in easy-to-reach places. If you need to reach something above you, use a strong step stool that has a grab bar. Keep electrical cords out of the way. Do not use floor polish or wax that  makes floors slippery. If you must use wax, use non-skid floor wax. Do not have throw rugs and other things on the floor that can make you trip. What can I do with my stairs? Do not leave any items on the stairs. Make sure that there are handrails on both sides of the stairs and use them. Fix handrails that are broken or loose. Make sure that handrails are as long as the stairways. Check any carpeting to make sure that it is firmly attached to the stairs. Fix any carpet that is loose or worn. Avoid having throw rugs at the top or bottom of the stairs. If you do have throw rugs, attach them to the floor with carpet tape. Make sure that you have a light switch at the top of the stairs and the bottom of the stairs. If you do not have them, ask someone to add them for you. What else can I do to help prevent falls? Wear shoes that: Do not have high heels. Have rubber bottoms. Are comfortable and fit you well. Are closed  at the toe. Do not wear sandals. If you use a stepladder: Make sure that it is fully opened. Do not climb a closed stepladder. Make sure that both sides of the stepladder are locked into place. Ask someone to hold it for you, if possible. Clearly mark and make sure that you can see: Any grab bars or handrails. First and last steps. Where the edge of each step is. Use tools that help you move around (mobility aids) if they are needed. These include: Canes. Walkers. Scooters. Crutches. Turn on the lights when you go into a dark area. Replace any light bulbs as soon as they burn out. Set up your furniture so you have a clear path. Avoid moving your furniture around. If any of your floors are uneven, fix them. If there are any pets around you, be aware of where they are. Review your medicines with your doctor. Some medicines can make you feel dizzy. This can increase your chance of falling. Ask your doctor what other things that you can do to help prevent falls. This information is not intended to replace advice given to you by your health care provider. Make sure you discuss any questions you have with your health care provider. Document Released: 09/20/2009 Document Revised: 05/01/2016 Document Reviewed: 12/29/2014 Elsevier Interactive Patient Education  2017 [employer].

## 2023-05-12 DIAGNOSIS — H26491 Other secondary cataract, right eye: Secondary | ICD-10-CM | POA: Diagnosis not present

## 2023-05-27 ENCOUNTER — Encounter: Payer: Self-pay | Admitting: Internal Medicine

## 2023-05-27 DIAGNOSIS — K117 Disturbances of salivary secretion: Secondary | ICD-10-CM | POA: Insufficient documentation

## 2023-05-27 NOTE — Assessment & Plan Note (Signed)
CPAP with good compliance and control. Plan-continue auto 5-12, adjust humidifier, try Biotene mouthwash

## 2023-05-27 NOTE — Assessment & Plan Note (Signed)
If adjustment of CPAP humidifier, adequate hydration and trial of Biotene mouthwash or insufficient, then she is advised to discuss xerostomia with her dentist and primary physician.

## 2023-06-29 ENCOUNTER — Ambulatory Visit: Payer: Medicare Other | Admitting: Hematology and Oncology

## 2023-07-01 ENCOUNTER — Other Ambulatory Visit: Payer: Self-pay | Admitting: Hematology and Oncology

## 2023-07-06 ENCOUNTER — Encounter: Payer: Self-pay | Admitting: Family Medicine

## 2023-07-07 ENCOUNTER — Telehealth: Payer: Self-pay | Admitting: Hematology and Oncology

## 2023-07-07 NOTE — Telephone Encounter (Signed)
Rescheduled appointment per incoming call from patient. She is aware of the changes made to her upcoming appointment.

## 2023-07-10 ENCOUNTER — Ambulatory Visit: Payer: Medicare Other | Admitting: Hematology and Oncology

## 2023-07-23 NOTE — Progress Notes (Deleted)
Shawna Marquez [address] [place] [address] Phone: [phone] Subjective:    I'm seeing this patient by the request  of:  [name], MD  CC:   OAC:[id number]  04/23/2023 Crazy breakdown of the transverse arch noted.  Discussed with patient to continue to use the over-the-counter orthotics, home exercises given, which activities to do and which ones to avoid.  Increase activity slowly over the course of next several weeks.  Follow-up with me again in 6 to 8 weeks.  We discussed shoe choices as well      Update 07/28/2023 Shawna Marquez is a 82 y.o. female coming in with complaint of B foot pain. 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   Complication of anesthesia    bp will drop,hard to wake up   DIVERTICULITIS, HX OF 10/12/2007   Family history of breast cancer    HYPOTHYROIDISM, PRIMARY 10/12/2007   OSTEOARTHRITIS 09/09/2007   back    Radiation 04/10/15-05/25/15   Right Breast   Sleep apnea    USES cpap NIGHTLY   Wears glasses    Past Surgical History:  Procedure Laterality Date   ABDOMINAL HYSTERECTOMY  1977   COLON SURGERY  2006   part colectomy   COLONOSCOPY  2007   colovaginal fistula  2007   colon resection   EYE SURGERY     both cataracts   HERNIA REPAIR  2007   ventral   MASTECTOMY Left 1997   lt mast-axillary node dissection   PORTACATH PLACEMENT Left 01/01/2015   Procedure: INSERTION PORT-A-CATH;  Surgeon: Emelia Loron, MD;  Location: [facility];  Service: General;  Laterality: Left;   RADIOACTIVE SEED GUIDED PARTIAL MASTECTOMY WITH AXILLARY SENTINEL LYMPH NODE BIOPSY Right 11/28/2014   Procedure: RADIOACTIVE SEED GUIDED RIGHT LUMPECTOMY WITH RIGHT AXILLARY SENTINEL LYMPH NODE BIOPSY;  Surgeon: Emelia Loron, MD;  Location: [facility];  Service: General;  Laterality: Right;   RE-EXCISION OF BREAST CANCER,SUPERIOR MARGINS Right 01/01/2015   Procedure: RIGHT BREAST MARGIN EXCISION;  Surgeon: Emelia Loron, MD;  Location: [facility];  Service: General;  Laterality: Right;   TONSILLECTOMY     WISDOM TOOTH EXTRACTION     Social History   Socioeconomic History   Marital status: Married    Spouse name: Not on file   Number of children: 3   Years of education: Not on file   Highest education level: Not on file  Occupational History   Occupation: retired  Tobacco Use   Smoking status: Never   Smokeless tobacco: Never  Vaping Use   Vaping status: Never Used  Substance and Sexual Activity   Alcohol use: Yes    Alcohol/week: 0.0 standard drinks of alcohol    Comment: occassional beer   Drug use: No   Sexual activity: Yes  Other Topics Concern   Not on file  Social History Narrative   Not on file   Social Determinants of Health   Financial Resource Strain: Low Risk  (05/07/2023)   Overall Financial Resource Strain (CARDIA)    Difficulty of Paying Living Expenses: Not hard at all  Food Insecurity: No Food Insecurity (05/07/2023)   Hunger Vital Sign    Worried About Running Out of Food in the Last  Year: Never true    Ran Out of Food in the Last Year: Never true  Transportation Needs: No Transportation Needs (05/07/2023)   PRAPARE - Administrator, Civil Service (Medical): No    Lack of Transportation (Non-Medical): No  Physical Activity: Insufficiently Active (05/07/2023)   Exercise Vital Sign    Days of Exercise per Week: 4 days    Minutes of Exercise per Session: 20 min  Stress: No Stress Concern Present (05/07/2023)   [employer] of Occupational Health - Occupational Stress Questionnaire    Feeling of Stress : Not at all  Social Connections: Unknown (05/07/2023)   Social Connection and Isolation Panel [NHANES]    Frequency of Communication with Friends and Family: More than  three times a week    Frequency of Social Gatherings with Friends and Family: Three times a week    Attends Religious Services: Not on file    Active Member of Clubs or Organizations: Yes    Attends Banker Meetings: More than 4 times per year    Marital Status: Married   No Known Allergies Family History  Problem Relation Age of Onset   Breast cancer Paternal Grandmother 38   Lung cancer Father 18       smoker   Prostate cancer Cousin        paternal cousin    Heart disease Neg Hx        family hx   Cancer Neg Hx        lung ca   COPD Neg Hx        family hx   Colon cancer Neg Hx     Current Outpatient Medications (Endocrine & Metabolic):    levothyroxine (SYNTHROID) 125 MCG tablet, Take 1 tablet (125 mcg total) by mouth daily before breakfast.   Current Outpatient Medications (Respiratory):    cetirizine (ZYRTEC) 10 MG tablet, Take 10 mg by mouth daily.   fluticasone (FLONASE) 50 MCG/ACT nasal spray, SPRAY 2 SPRAYS INTO EACH NOSTRIL EVERY DAY  Current Outpatient Medications (Analgesics):    aspirin EC 81 MG tablet, Take 81 mg by mouth at bedtime. Every other day  Current Outpatient Medications (Hematological):    folic acid (FOLVITE) 400 MCG tablet, Take 400 mcg by mouth daily with lunch.    vitamin B-12 (CYANOCOBALAMIN) 500 MCG tablet, Take 500 mcg by mouth daily.  Current Outpatient Medications (Other):    Calcium Citrate-Vitamin D (CALCIUM CITRATE + D3 PO), Take 1 tablet by mouth 2 (two) times daily. Calcium 500 mg, Vitamin D 800 units   cholecalciferol (VITAMIN D) 1000 UNITS tablet, Take 1,000 Units by mouth daily with lunch.    Coenzyme Q10 (COQ10) 200 MG CAPS, Take 200 mg by mouth every other day.   exemestane (AROMASIN) 25 MG tablet, TAKE 1 TABLET BY MOUTH EVERY DAY AFTER BREAKFAST   hydrocortisone cream 1 %, Apply 1 application topically daily as needed for itching (eczema).   Multiple Vitamin (MULTIVITAMIN WITH MINERALS) TABS tablet, Take 1 tablet  by mouth daily with lunch.   Omega-3 Fatty Acids (FISH OIL) 1000 MG CAPS, Take 1,000 mg by mouth daily with lunch.   Turmeric 450 MG CAPS, Take 450 mg by mouth daily with lunch.    vitamin C (ASCORBIC ACID) 500 MG tablet, Take 500 mg by mouth 2 (two) times daily.   Reviewed prior external information including notes and imaging from  primary care provider As well as notes that were available from care everywhere  and other healthcare systems.  Past medical history, social, surgical and family history all reviewed in electronic medical record.  No pertanent information unless stated regarding to the chief complaint.   Review of Systems:  No headache, visual changes, nausea, vomiting, diarrhea, constipation, dizziness, abdominal pain, skin rash, fevers, chills, night sweats, weight loss, swollen lymph nodes, body aches, joint swelling, chest pain, shortness of breath, mood changes. POSITIVE muscle aches  Objective  There were no vitals taken for this visit.   General: No apparent distress alert and oriented x3 mood and affect normal, dressed appropriately.  HEENT: Pupils equal, extraocular movements intact  Respiratory: Patient's speak in full sentences and does not appear short of breath  Cardiovascular: No lower extremity edema, non tender, no erythema      Impression and Recommendations:

## 2023-07-28 ENCOUNTER — Ambulatory Visit: Payer: Medicare Other | Admitting: Family Medicine

## 2023-07-30 ENCOUNTER — Telehealth: Payer: Self-pay | Admitting: Hematology and Oncology

## 2023-07-30 NOTE — Telephone Encounter (Signed)
Rescheduled appointment per provider PAL. Patient is aware of the changes made to her upcoming appointment. 

## 2023-07-31 NOTE — Progress Notes (Signed)
[facility] [address] [place] [address] Phone: [phone] Subjective:   Shawna Marquez, am serving as a scribe for Dr. Antoine Primas.  I'm seeing this patient by the request  of:  [name], MD  CC: foot pain follow up in  ONG:[id number]  04/23/2023 Crazy breakdown of the transverse arch noted. Discussed with patient to continue to use the over-the-counter orthotics, home exercises given, which activities to do and which ones to avoid. Increase activity slowly over the course of next several weeks. Follow-up with me again in 6 to 8 weeks. We discussed shoe choices as well   Updated 08/04/2023 Shawna Marquez is a 82 y.o. female coming in with complaint of foot pain. Foot pain has improved. HOKA and [employer] are helping.  Patient states that very tolerable at the moment.  No pain.     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   Complication of anesthesia    bp will drop,hard to wake up   DIVERTICULITIS, HX OF 10/12/2007   Family history of breast cancer    HYPOTHYROIDISM, PRIMARY 10/12/2007   OSTEOARTHRITIS 09/09/2007   back    Radiation 04/10/15-05/25/15   Right Breast   Sleep apnea    USES cpap NIGHTLY   Wears glasses    Past Surgical History:  Procedure Laterality Date   ABDOMINAL HYSTERECTOMY  1977   COLON SURGERY  2006   part colectomy   COLONOSCOPY  2007   colovaginal fistula  2007   colon resection   EYE SURGERY     both cataracts   HERNIA REPAIR  2007   ventral   MASTECTOMY Left 1997   lt mast-axillary node dissection   PORTACATH PLACEMENT Left 01/01/2015   Procedure: INSERTION PORT-A-CATH;  Surgeon: Emelia Loron, MD;  Location: [facility];  Service: General;  Laterality: Left;   RADIOACTIVE SEED GUIDED PARTIAL MASTECTOMY WITH AXILLARY SENTINEL LYMPH NODE  BIOPSY Right 11/28/2014   Procedure: RADIOACTIVE SEED GUIDED RIGHT LUMPECTOMY WITH RIGHT AXILLARY SENTINEL LYMPH NODE BIOPSY;  Surgeon: Emelia Loron, MD;  Location: [facility];  Service: General;  Laterality: Right;   RE-EXCISION OF BREAST CANCER,SUPERIOR MARGINS Right 01/01/2015   Procedure: RIGHT BREAST MARGIN EXCISION;  Surgeon: Emelia Loron, MD;  Location: [facility];  Service: General;  Laterality: Right;   TONSILLECTOMY     WISDOM TOOTH EXTRACTION     Social History   Socioeconomic History   Marital status: Married    Spouse name: Not on file   Number of children: 3   Years of education: Not on file   Highest education level: Not on file  Occupational History   Occupation: retired  Tobacco Use   Smoking status: Never   Smokeless tobacco: Never  Vaping Use   Vaping status: Never Used  Substance and Sexual Activity   Alcohol use: Yes    Alcohol/week: 0.0 standard drinks of alcohol    Comment: occassional beer   Drug use: No   Sexual activity: Yes  Other Topics Concern   Not on file  Social History Narrative   Not on file   Social Determinants of Health   Financial Resource Strain: Low Risk  (05/07/2023)   Overall Financial Resource Strain (CARDIA)    Difficulty of Paying Living Expenses: Not hard at  all  Food Insecurity: No Food Insecurity (05/07/2023)   Hunger Vital Sign    Worried About Running Out of Food in the Last Year: Never true    Ran Out of Food in the Last Year: Never true  Transportation Needs: No Transportation Needs (05/07/2023)   PRAPARE - Administrator, Civil Service (Medical): No    Lack of Transportation (Non-Medical): No  Physical Activity: Insufficiently Active (05/07/2023)   Exercise Vital Sign    Days of Exercise per Week: 4 days    Minutes of Exercise per Session: 20 min  Stress: No Stress Concern Present (05/07/2023)   [employer] of Occupational Health - Occupational Stress Questionnaire    Feeling of  Stress : Not at all  Social Connections: Unknown (05/07/2023)   Social Connection and Isolation Panel [NHANES]    Frequency of Communication with Friends and Family: More than three times a week    Frequency of Social Gatherings with Friends and Family: Three times a week    Attends Religious Services: Not on file    Active Member of Clubs or Organizations: Yes    Attends Banker Meetings: More than 4 times per year    Marital Status: Married   No Known Allergies Family History  Problem Relation Age of Onset   Breast cancer Paternal Grandmother 28   Lung cancer Father 60       smoker   Prostate cancer Cousin        paternal cousin    Heart disease Neg Hx        family hx   Cancer Neg Hx        lung ca   COPD Neg Hx        family hx   Colon cancer Neg Hx     Current Outpatient Medications (Endocrine & Metabolic):    levothyroxine (SYNTHROID) 125 MCG tablet, Take 1 tablet (125 mcg total) by mouth daily before breakfast.   Current Outpatient Medications (Respiratory):    cetirizine (ZYRTEC) 10 MG tablet, Take 10 mg by mouth daily.   fluticasone (FLONASE) 50 MCG/ACT nasal spray, SPRAY 2 SPRAYS INTO EACH NOSTRIL EVERY DAY  Current Outpatient Medications (Analgesics):    aspirin EC 81 MG tablet, Take 81 mg by mouth at bedtime. Every other day  Current Outpatient Medications (Hematological):    folic acid (FOLVITE) 400 MCG tablet, Take 400 mcg by mouth daily with lunch.    vitamin B-12 (CYANOCOBALAMIN) 500 MCG tablet, Take 500 mcg by mouth daily.  Current Outpatient Medications (Other):    Calcium Citrate-Vitamin D (CALCIUM CITRATE + D3 PO), Take 1 tablet by mouth 2 (two) times daily. Calcium 500 mg, Vitamin D 800 units   cholecalciferol (VITAMIN D) 1000 UNITS tablet, Take 1,000 Units by mouth daily with lunch.    Coenzyme Q10 (COQ10) 200 MG CAPS, Take 200 mg by mouth every other day.   hydrocortisone cream 1 %, Apply 1 application topically daily as needed for  itching (eczema).   Multiple Vitamin (MULTIVITAMIN WITH MINERALS) TABS tablet, Take 1 tablet by mouth daily with lunch.   Omega-3 Fatty Acids (FISH OIL) 1000 MG CAPS, Take 1,000 mg by mouth daily with lunch.   Turmeric 450 MG CAPS, Take 450 mg by mouth daily with lunch.    vitamin C (ASCORBIC ACID) 500 MG tablet, Take 500 mg by mouth 2 (two) times daily.    Objective  Blood pressure 118/78, pulse 80, height 5' 0.5" (1.537 m), weight  163 lb (73.9 kg), SpO2 98%.   General: No apparent distress alert and oriented x3 mood and affect normal, dressed appropriately.  HEENT: Pupils equal, extraocular movements intact  Ambulates without any type of antalgic gait.  Patient is sitting comfortably.  Shoes are fitting well    Impression and Recommendations:     The above documentation has been reviewed and is accurate and complete Judi Saa, DO

## 2023-07-31 NOTE — Progress Notes (Signed)
Patient Care Team: [name], MD as PCP - General (Internal Medicine) Emelia Loron, MD as Consulting Physician (General Surgery) Serena Croissant, MD as Consulting Physician (Hematology and Oncology) Lurline Hare, MD as Consulting Physician (Radiation Oncology) Hubbard Hartshorn, NP (Inactive) as Nurse Practitioner (Nurse Practitioner) Salomon Fick, NP as Nurse Practitioner (Nurse Practitioner) Janet Berlin, MD as Consulting Physician (Ophthalmology) Nita Sells, MD as Consulting Physician (Dermatology) Waymon Budge, MD as Consulting Physician (Pulmonary Disease)  DIAGNOSIS: No diagnosis found.  SUMMARY OF ONCOLOGIC HISTORY: Oncology History  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 breast (HCC)  06/07/1996 Miscellaneous   History of Stage IIB left breast cancer S/P mastectomy and chemotherapy (Adriamycin and Cytoxan) on CALGB 9397.   11/10/2014 Mammogram   Right breast: 9 mm mass    11/13/2014 Breast US   Right breast: 9 mm mass at 1:00 position with indistinct margin, middle depth, hypoechoic.   11/15/2014 Initial Biopsy   Right breast needle biopsy: Invasive ductal carcinoma, grade 2, with DCIS with lymphovascular invasion ER+ (100%), PR+ (100%), HER-2/neu negative (ratio 1.87), Ki-67 23%   11/15/2014 Clinical Stage   Stage IA; T1b N0   11/28/2014 Definitive Surgery   Right breast lumpectomy with SLNB Dwain Sarna): Invasive ductal carcinoma, grade 3, 1.7 cm, with DCIS, focal vascular involvement, posterior margin involvement, 1/2 sentinel nodes positive for malignancy, repeat HER2/neu negative (ratio 1.14)   11/28/2014 Procedure   Mammaprint: High Risk Luminal Type B.    11/28/2014 Pathologic Stage   Stage IIA: pT1c, pN1a   01/01/2015 Surgery   Rexcision of right breast margin: surgical margins negative for malignancy   01/15/2015 - 03/20/2015 Chemotherapy   Adjuvant chemotherapy with Taxotere and Cyclophosphamide X 4 cycles  ([facility])   04/10/2015 - 05/25/2015 Radiation Therapy   Adjuvant XRT completed Michell Heinrich): Right breast 45 Gy over 25 fractions; Right breast boost 16 Gy at 2 Gy over 8 fractions. Total dose 61 Gy   05/31/2015 -  Anti-estrogen oral therapy   Anastrozole 1 mg daily stopped at 04/03/2016 due to tendinitis switched to Femara 04/21/2016 discontinued June 2017, starting exemestane 09/11/2016   08/03/2015 Survivorship   Survivorship visit completed and copy of survivorship care plan provided to patient.     CHIEF COMPLIANT:   INTERVAL HISTORY: Shawna Marquez is a   ALLERGIES:  has No Known Allergies.  MEDICATIONS:  Current Outpatient Medications  Medication Sig Dispense Refill   aspirin EC 81 MG tablet Take 81 mg by mouth at bedtime. Every other day     Calcium Citrate-Vitamin D (CALCIUM CITRATE + D3 PO) Take 1 tablet by mouth 2 (two) times daily. Calcium 500 mg, Vitamin D 800 units     cetirizine (ZYRTEC) 10 MG tablet Take 10 mg by mouth daily.     cholecalciferol (VITAMIN D) 1000 UNITS tablet Take 1,000 Units by mouth daily with lunch.      Coenzyme Q10 (COQ10) 200 MG CAPS Take 200 mg by mouth every other day.     exemestane (AROMASIN) 25 MG tablet TAKE 1 TABLET BY MOUTH EVERY DAY AFTER BREAKFAST 90 tablet 3   fluticasone (FLONASE) 50 MCG/ACT nasal spray SPRAY 2 SPRAYS INTO EACH NOSTRIL EVERY DAY 48 mL 3   folic acid (FOLVITE) 400 MCG tablet Take 400 mcg by mouth daily with lunch.      hydrocortisone cream 1 % Apply 1 application topically daily as needed for itching (eczema).     levothyroxine (SYNTHROID) 125 MCG tablet Take 1  tablet (125 mcg total) by mouth daily before breakfast. 90 tablet 3   Multiple Vitamin (MULTIVITAMIN WITH MINERALS) TABS tablet Take 1 tablet by mouth daily with lunch.     Omega-3 Fatty Acids (FISH OIL) 1000 MG CAPS Take 1,000 mg by mouth daily with lunch.     Turmeric 450 MG CAPS Take 450 mg by mouth daily with lunch.      vitamin B-12 (CYANOCOBALAMIN) 500 MCG  tablet Take 500 mcg by mouth daily.     vitamin C (ASCORBIC ACID) 500 MG tablet Take 500 mg by mouth 2 (two) times daily.     No current facility-administered medications for this visit.    PHYSICAL EXAMINATION: ECOG PERFORMANCE STATUS: {CHL ONC ECOG PS:[id number]}  There were no vitals filed for this visit. There were no vitals filed for this visit.  BREAST:*** No palpable masses or nodules in either right or left breasts. No palpable axillary supraclavicular or infraclavicular adenopathy no breast tenderness or nipple discharge. (exam performed in the presence of a chaperone)  LABORATORY DATA:  I have reviewed the data as listed    Latest Ref Rng & Units 01/30/2023    4:13 PM 12/24/2021   12:07 PM 05/14/2021   12:24 PM  CMP  Glucose 70 - 99 mg/dL 75  93  90   BUN 6 - 23 mg/dL 19  17  25    Creatinine 0.40 - 1.20 mg/dL 8.65  7.84  6.96   Sodium 135 - 145 mEq/L 141  140  138   Potassium 3.5 - 5.1 mEq/L 4.5  4.3  5.0   Chloride 96 - 112 mEq/L 101  101  102   CO2 19 - 32 mEq/L 31  30  26    Calcium 8.4 - 10.5 mg/dL 29.5  28.4  9.2   Total Protein 6.0 - 8.3 g/dL 7.4  7.5    Total Bilirubin 0.2 - 1.2 mg/dL 0.5  0.5    Alkaline Phos 39 - 117 U/L 82  71    AST 0 - 37 U/L 21  22  17    ALT 0 - 35 U/L 16  17  14      Lab Results  Component Value Date   WBC 6.3 01/30/2023   HGB 14.5 01/30/2023   HCT 43.8 01/30/2023   MCV 94.8 01/30/2023   PLT 202.0 01/30/2023   NEUTROABS 3.7 01/30/2023    ASSESSMENT & PLAN:  No problem-specific Assessment & Plan notes found for this encounter.    No orders of the defined types were placed in this encounter.  The patient has a good understanding of the overall plan. she agrees with it. she will call with any problems that may develop before the next visit here. Total time spent: 30 mins including face to face time and time spent for planning, charting and co-ordination of care   Sherlyn Lick, CMA 07/31/23    I Janan Ridge am  acting as a Neurosurgeon for [employer]  ***

## 2023-08-03 ENCOUNTER — Inpatient Hospital Stay: Payer: Medicare Other | Attending: Hematology and Oncology | Admitting: Hematology and Oncology

## 2023-08-03 VITALS — BP 156/82 | HR 83 | Temp 98.4°F | Resp 18 | Ht 60.5 in | Wt 163.3 lb

## 2023-08-03 DIAGNOSIS — Z9012 Acquired absence of left breast and nipple: Secondary | ICD-10-CM | POA: Diagnosis not present

## 2023-08-03 DIAGNOSIS — Z170 Estrogen receptor positive status [ER+]: Secondary | ICD-10-CM | POA: Insufficient documentation

## 2023-08-03 DIAGNOSIS — Z923 Personal history of irradiation: Secondary | ICD-10-CM | POA: Insufficient documentation

## 2023-08-03 DIAGNOSIS — C50211 Malignant neoplasm of upper-inner quadrant of right female breast: Secondary | ICD-10-CM | POA: Diagnosis not present

## 2023-08-03 DIAGNOSIS — Z9221 Personal history of antineoplastic chemotherapy: Secondary | ICD-10-CM | POA: Insufficient documentation

## 2023-08-03 DIAGNOSIS — Z853 Personal history of malignant neoplasm of breast: Secondary | ICD-10-CM | POA: Insufficient documentation

## 2023-08-03 DIAGNOSIS — G620 Drug-induced polyneuropathy: Secondary | ICD-10-CM | POA: Insufficient documentation

## 2023-08-03 DIAGNOSIS — Z9223 Personal history of estrogen therapy: Secondary | ICD-10-CM | POA: Insufficient documentation

## 2023-08-03 NOTE — Assessment & Plan Note (Signed)
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, focal vascular involvement, posterior margin involvement, 1/2 sentinel node positive, T1 cN1 M0 stage II a, Mammaprint luminal type B, high risk, disease free survival with chemotherapy and hormonal therapy 88% versus 76% with hormonal therapy alone;   Treatment summary: Adjuvant chemotherapy with Taxotere and Cytoxan every 3 weeks 4 cycles started 01/15/15 completed 03/19/15, followed by adjuvant radiation therapy 04/10/15- 05/25/2015 Started anastrozole 5 years 05/31/2015 stopped 04/03/2016 due to tendinitis involving bilateral wrists Started letrozole 04/14/2016; changed to exemestane October 2017   Exemestane toxicities: Marked improvement in arthralgias and myalgias.  Plan is to treat her for 7 years total.  1 more year left on antiestrogen therapy She may decide to continue beyond 7 years.   Neuropathy: Improving slowly with time. We'll continue to watch and observe. Bone density 04/09/2016: Osteopenia T score -1.9 continue with calcium and vitamin D I ordered another bone density to be done along with her mammogram on 07/05/2021 at [facility].   Breast Cancer Surveillance: 1. Breast exam 08/03/2023: No abnormalities 2. Mammograms: 07/29/2022 at [facility]: Benign, breast density category B Her daughter Kennyth Arnold is getting married in October.   Return to clinic in 1 year for follow-up

## 2023-08-04 ENCOUNTER — Other Ambulatory Visit: Payer: Self-pay

## 2023-08-04 ENCOUNTER — Ambulatory Visit (INDEPENDENT_AMBULATORY_CARE_PROVIDER_SITE_OTHER): Payer: Medicare Other | Admitting: Family Medicine

## 2023-08-04 ENCOUNTER — Ambulatory Visit: Payer: Medicare Other | Admitting: Hematology and Oncology

## 2023-08-04 ENCOUNTER — Encounter: Payer: Self-pay | Admitting: Family Medicine

## 2023-08-04 VITALS — BP 118/78 | HR 80 | Ht 60.5 in | Wt 163.0 lb

## 2023-08-04 DIAGNOSIS — M79671 Pain in right foot: Secondary | ICD-10-CM | POA: Diagnosis not present

## 2023-08-04 DIAGNOSIS — M216X1 Other acquired deformities of right foot: Secondary | ICD-10-CM

## 2023-08-04 DIAGNOSIS — Z1231 Encounter for screening mammogram for malignant neoplasm of breast: Secondary | ICD-10-CM | POA: Diagnosis not present

## 2023-08-04 DIAGNOSIS — M79672 Pain in left foot: Secondary | ICD-10-CM | POA: Diagnosis not present

## 2023-08-04 DIAGNOSIS — Z853 Personal history of malignant neoplasm of breast: Secondary | ICD-10-CM | POA: Diagnosis not present

## 2023-08-04 DIAGNOSIS — M8588 Other specified disorders of bone density and structure, other site: Secondary | ICD-10-CM | POA: Diagnosis not present

## 2023-08-04 LAB — HM DEXA SCAN

## 2023-08-04 LAB — HM MAMMOGRAPHY

## 2023-08-04 NOTE — Assessment & Plan Note (Signed)
Significant improvement with the issues at the moment.  Discussed icing regimen and home exercises, discussed which activities to do and which ones to avoid.  Increase activity slowly.  Follow-up again as needed.

## 2023-09-14 ENCOUNTER — Telehealth: Payer: Self-pay | Admitting: *Deleted

## 2023-09-14 NOTE — Telephone Encounter (Signed)
Per MD review of bone density requested appt with [facility] NP to discuss and implement appropriate therapy.  This RN attempted to call pt on both her cell and home number- with no answer.

## 2023-09-19 NOTE — Progress Notes (Unsigned)
[facility] Cancer Follow up:    Shawna Sanes, MD [address] [place] [address]   DIAGNOSIS: Cancer Staging  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 breast [facility]) Staging form: Breast, AJCC 7th Edition - Clinical stage from 11/22/2014: Stage IA (T1b, N0, M0) - Unsigned Staged by: Pathologist and managing physician Laterality: Right Estrogen receptor status: Positive Progesterone receptor status: Positive HER2 status: Negative Stage used in treatment planning: Yes National guidelines used in treatment planning: Yes - Pathologic: Stage IIA (T1c, N1a, cM0) - Signed by Shawna Riggs, MD on 12/26/2014 Laterality: Right Estrogen receptor status: Positive Progesterone receptor status: Positive HER2 status: Negative Stage used in treatment planning: Yes National guidelines used in treatment planning: Yes Type of national guideline used in treatment planning: NCCN Staging comments: Staged on final lumpectomy specimen read by Dr. Luisa Marquez.   SUMMARY OF ONCOLOGIC HISTORY: Oncology History  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 breast (HCC)  06/07/1996 Miscellaneous   History of Stage IIB left breast cancer S/P mastectomy and chemotherapy (Adriamycin and Cytoxan) on CALGB 9397.   11/10/2014 Mammogram   Right breast: 9 mm mass    11/13/2014 Breast US   Right breast: 9 mm mass at 1:00 position with indistinct margin, middle depth, hypoechoic.   11/15/2014 Initial Biopsy   Right breast needle biopsy: Invasive ductal carcinoma, grade 2, with DCIS with lymphovascular invasion ER+ (100%), PR+ (100%), HER-2/neu negative (ratio 1.87), Ki-67 23%   11/15/2014 Clinical Stage   Stage IA; T1b N0   11/28/2014 Definitive Surgery   Right breast lumpectomy with SLNB Shawna Marquez): Invasive ductal carcinoma, grade 3, 1.7 cm, with DCIS, focal vascular involvement, posterior margin involvement, 1/2 sentinel nodes positive for malignancy, repeat  HER2/neu negative (ratio 1.14)   11/28/2014 Procedure   Mammaprint: High Risk Luminal Type B.    11/28/2014 Pathologic Stage   Stage IIA: pT1c, pN1a   01/01/2015 Surgery   Rexcision of right breast margin: surgical margins negative for malignancy   01/15/2015 - 03/20/2015 Chemotherapy   Adjuvant chemotherapy with Taxotere and Cyclophosphamide X 4 cycles (Shawna Marquez)   04/10/2015 - 05/25/2015 Radiation Therapy   Adjuvant XRT completed Shawna Marquez): Right breast 45 Gy over 25 fractions; Right breast boost 16 Gy at 2 Gy over 8 fractions. Total dose 61 Gy   05/31/2015 -  Anti-estrogen oral therapy   Anastrozole 1 mg daily stopped at 04/03/2016 due to tendinitis switched to Femara 04/21/2016 discontinued June 2017, starting exemestane 09/11/2016   08/03/2015 Survivorship   Survivorship visit completed and copy of survivorship care plan provided to patient.     CURRENT THERAPY: Exemestane  INTERVAL HISTORY: Shawna Marquez 82 y.o. female returns for f/u to discuss her bone density results.  She underwent DEXA on 08/04/2023 at [facility] that demonstrated osteoporosis with a t score of -3.2 in the forearm.     Patient Active Problem List   Diagnosis Date Noted   Xerostomia 05/27/2023   Loss of transverse plantar arch 04/23/2023   Arm pain, anterior, left 01/31/2023   Left arm numbness 01/31/2023   Eczema 12/24/2021   Toxic neuropathy (HCC) 12/24/2021   Osteoporosis of forearm 07/31/2021   OSA on CPAP 01/27/2018   History of colonic polyps 01/23/2017   Lymphedema of arm 04/12/2015   Cancer of right breast [facility])    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 breast (HCC) 11/17/2014   Hypothyroidism 10/12/2007   Allergic rhinitis 10/12/2007   History  of diverticulitis,  s/p partial colectomy 10/12/2007   Osteoarthritis 09/09/2007    has No Known Allergies.  MEDICAL HISTORY: Past Medical History:  Diagnosis Date   ALLERGIC RHINITIS 10/12/2007   BREAST CANCER, HX OF 10/12/2007   right  breast diagnosed - 11/2014   Cancer of upper-inner quadrant of female breast (HCC) 11/17/2014   Complication of anesthesia    bp will drop,hard to wake up   DIVERTICULITIS, HX OF 10/12/2007   Family history of breast cancer    HYPOTHYROIDISM, PRIMARY 10/12/2007   OSTEOARTHRITIS 09/09/2007   back    Radiation 04/10/15-05/25/15   Right Breast   Sleep apnea    USES cpap NIGHTLY   Wears glasses     SURGICAL HISTORY: Past Surgical History:  Procedure Laterality Date   ABDOMINAL HYSTERECTOMY  1977   COLON SURGERY  2006   part colectomy   COLONOSCOPY  2007   colovaginal fistula  2007   colon resection   EYE SURGERY     both cataracts   HERNIA REPAIR  2007   ventral   MASTECTOMY Left 1997   lt mast-axillary node dissection   PORTACATH PLACEMENT Left 01/01/2015   Procedure: INSERTION PORT-A-CATH;  Surgeon: Shawna Loron, MD;  Location: [facility] OR;  Service: General;  Laterality: Left;   RADIOACTIVE SEED GUIDED PARTIAL MASTECTOMY WITH AXILLARY SENTINEL LYMPH NODE BIOPSY Right 11/28/2014   Procedure: RADIOACTIVE SEED GUIDED RIGHT LUMPECTOMY WITH RIGHT AXILLARY SENTINEL LYMPH NODE BIOPSY;  Surgeon: Shawna Loron, MD;  Location: [facility];  Service: General;  Laterality: Right;   RE-EXCISION OF BREAST CANCER,SUPERIOR MARGINS Right 01/01/2015   Procedure: RIGHT BREAST MARGIN EXCISION;  Surgeon: Shawna Loron, MD;  Location: [facility] OR;  Service: General;  Laterality: Right;   TONSILLECTOMY     WISDOM TOOTH EXTRACTION      SOCIAL HISTORY: Social History   Socioeconomic History   Marital status: Married    Spouse name: Not on file   Number of children: 3   Years of education: Not on file   Highest education level: Not on file  Occupational History   Occupation: retired  Tobacco Use   Smoking status: Never   Smokeless tobacco: Never  Vaping Use   Vaping status: Never Used  Substance and Sexual Activity   Alcohol use: Yes    Alcohol/week: 0.0 standard drinks of  alcohol    Comment: occassional beer   Drug use: No   Sexual activity: Yes  Other Topics Concern   Not on file  Social History Narrative   Not on file   Social Determinants of Health   Financial Resource Strain: Low Risk  (05/07/2023)   Overall Financial Resource Strain (CARDIA)    Difficulty of Paying Living Expenses: Not hard at all  Food Insecurity: No Food Insecurity (05/07/2023)   Hunger Vital Sign    Worried About Running Out of Food in the Last Year: Never true    Ran Out of Food in the Last Year: Never true  Transportation Needs: No Transportation Needs (05/07/2023)   PRAPARE - Administrator, Civil Service (Medical): No    Lack of Transportation (Non-Medical): No  Physical Activity: Insufficiently Active (05/07/2023)   Exercise Vital Sign    Days of Exercise per Week: 4 days    Minutes of Exercise per Session: 20 min  Stress: No Stress Concern Present (05/07/2023)   [employer] of Occupational Health - Occupational Stress Questionnaire    Feeling of Stress : Not at  all  Social Connections: Unknown (05/07/2023)   Social Connection and Isolation Panel [NHANES]    Frequency of Communication with Friends and Family: More than three times a week    Frequency of Social Gatherings with Friends and Family: Three times a week    Attends Religious Services: Not on file    Active Member of Clubs or Organizations: Yes    Attends Club or Organization Meetings: More than 4 times per year    Marital Status: Married  Intimate Partner Violence: Not At Risk (04/16/2022)   Humiliation, Afraid, Rape, and Kick questionnaire    Fear of Current or Ex-Partner: No    Emotionally Abused: No    Physically Abused: No    Sexually Abused: No    FAMILY HISTORY: Family History  Problem Relation Age of Onset   Breast cancer Paternal Grandmother 22   Lung cancer Father 26       smoker   Prostate cancer Cousin        paternal cousin    Heart disease Neg Hx        family hx    Cancer Neg Hx        lung ca   COPD Neg Hx        family hx   Colon cancer Neg Hx     Review of Systems  Constitutional:  Negative for appetite change, chills, fatigue, fever and unexpected weight change.  HENT:   Negative for hearing loss, lump/mass and trouble swallowing.   Eyes:  Negative for eye problems and icterus.  Respiratory:  Negative for chest tightness, cough and shortness of breath.   Cardiovascular:  Negative for chest pain, leg swelling and palpitations.  Gastrointestinal:  Negative for abdominal distention, abdominal pain, constipation, diarrhea, nausea and vomiting.  Endocrine: Negative for hot flashes.  Genitourinary:  Negative for difficulty urinating.   Musculoskeletal:  Negative for arthralgias.  Skin:  Negative for itching and rash.  Neurological:  Negative for dizziness, extremity weakness, headaches and numbness.  Hematological:  Negative for adenopathy. Does not bruise/bleed easily.  Psychiatric/Behavioral:  Negative for depression. The patient is not nervous/anxious.       PHYSICAL EXAMINATION    There were no vitals filed for this visit.  Physical Exam Constitutional:      General: She is not in acute distress.    Appearance: Normal appearance. She is not toxic-appearing.  HENT:     Head: Normocephalic and atraumatic.     Mouth/Throat:     Mouth: Mucous membranes are moist.     Pharynx: Oropharynx is clear. No oropharyngeal exudate or posterior oropharyngeal erythema.  Eyes:     General: No scleral icterus. Cardiovascular:     Rate and Rhythm: Normal rate and regular rhythm.     Pulses: Normal pulses.     Heart sounds: Normal heart sounds.  Pulmonary:     Effort: Pulmonary effort is normal.     Breath sounds: Normal breath sounds.  Abdominal:     General: Abdomen is flat. Bowel sounds are normal. There is no distension.     Palpations: Abdomen is soft.     Tenderness: There is no abdominal tenderness.  Musculoskeletal:        General: No  swelling.     Cervical back: Neck supple.  Lymphadenopathy:     Cervical: No cervical adenopathy.  Skin:    General: Skin is warm and dry.     Findings: No rash.  Neurological:  General: No focal deficit present.     Mental Status: She is alert.  Psychiatric:        Mood and Affect: Mood normal.        Behavior: Behavior normal.     LABORATORY DATA:  CBC    Component Value Date/Time   WBC 6.3 01/30/2023 1613   RBC 4.62 01/30/2023 1613   HGB 14.5 01/30/2023 1613   HGB 12.0 05/31/2015 0830   HCT 43.8 01/30/2023 1613   HCT 37.2 05/31/2015 0830   PLT 202.0 01/30/2023 1613   PLT 184 05/31/2015 0830   MCV 94.8 01/30/2023 1613   MCV 95.6 05/31/2015 0830   MCH 30.8 05/31/2015 0830   MCH 30.9 12/28/2014 1533   MCHC 33.1 01/30/2023 1613   RDW 12.8 01/30/2023 1613   RDW 13.1 05/31/2015 0830   LYMPHSABS 1.8 01/30/2023 1613   LYMPHSABS 0.6 (L) 05/31/2015 0830   MONOABS 0.5 01/30/2023 1613   MONOABS 0.5 05/31/2015 0830   EOSABS 0.2 01/30/2023 1613   EOSABS 0.3 05/31/2015 0830   BASOSABS 0.1 01/30/2023 1613   BASOSABS 0.0 05/31/2015 0830    CMP     Component Value Date/Time   NA 141 01/30/2023 1613   NA 140 05/31/2015 0830   K 4.5 01/30/2023 1613   K 4.1 05/31/2015 0830   CL 101 01/30/2023 1613   CO2 31 01/30/2023 1613   CO2 28 05/31/2015 0830   GLUCOSE 75 01/30/2023 1613   GLUCOSE 98 05/31/2015 0830   BUN 19 01/30/2023 1613   BUN 15.4 05/31/2015 0830   CREATININE 0.77 01/30/2023 1613   CREATININE 0.6 05/31/2015 0830   CALCIUM 10.4 01/30/2023 1613   CALCIUM 8.8 05/31/2015 0830   PROT 7.4 01/30/2023 1613   PROT 6.2 (L) 05/31/2015 0830   ALBUMIN 4.4 01/30/2023 1613   ALBUMIN 3.4 (L) 05/31/2015 0830   AST 21 01/30/2023 1613   AST 22 05/31/2015 0830   ALT 16 01/30/2023 1613   ALT 16 05/31/2015 0830   ALKPHOS 82 01/30/2023 1613   ALKPHOS 82 05/31/2015 0830   BILITOT 0.5 01/30/2023 1613   BILITOT 0.31 05/31/2015 0830   GFRNONAA 80 (L) 12/28/2014 1533   GFRAA  >90 12/28/2014 1533       PENDING LABS:   RADIOGRAPHIC STUDIES:  No results found.   PATHOLOGY:     ASSESSMENT and THERAPY PLAN:   No problem-specific Assessment & Plan notes found for this encounter.   No orders of the defined types were placed in this encounter.   All questions were answered. The patient knows to call the clinic with any problems, questions or concerns. We can certainly see the patient much sooner if necessary. This note was electronically signed. Noreene Filbert, NP 09/19/2023

## 2023-09-21 ENCOUNTER — Inpatient Hospital Stay: Payer: Medicare Other | Attending: Hematology and Oncology | Admitting: Adult Health

## 2023-09-21 VITALS — BP 126/64 | HR 83 | Temp 98.1°F | Resp 18 | Ht 60.5 in | Wt 164.8 lb

## 2023-09-21 DIAGNOSIS — C50211 Malignant neoplasm of upper-inner quadrant of right female breast: Secondary | ICD-10-CM | POA: Diagnosis not present

## 2023-09-21 DIAGNOSIS — Z923 Personal history of irradiation: Secondary | ICD-10-CM | POA: Insufficient documentation

## 2023-09-21 DIAGNOSIS — Z170 Estrogen receptor positive status [ER+]: Secondary | ICD-10-CM | POA: Diagnosis not present

## 2023-09-21 DIAGNOSIS — Z9221 Personal history of antineoplastic chemotherapy: Secondary | ICD-10-CM | POA: Diagnosis not present

## 2023-09-21 DIAGNOSIS — Z79811 Long term (current) use of aromatase inhibitors: Secondary | ICD-10-CM | POA: Diagnosis not present

## 2023-09-21 NOTE — Patient Instructions (Signed)
Bone Health Bones protect organs, store calcium, anchor muscles, and support the whole body. Keeping your bones strong is important, especially as you get older. You can take actions to help keep your bones strong and healthy. Why is keeping my bones healthy important?  Keeping your bones healthy is important because your body constantly replaces bone cells. Cells get old, and new cells take their place. As we age, we lose bone cells because the body may not be able to make enough new cells to replace the old cells. The amount of bone cells and bone tissue you have is referred to as bone mass. The higher your bone mass, the stronger your bones. The aging process leads to an overall loss of bone mass in the body, which can increase the likelihood of: Broken bones. A condition in which the bones become weak and brittle (osteoporosis). A large decline in bone mass occurs in older adults. In women, it occurs about the time of menopause. What actions can I take to keep my bones healthy? Good health habits are important for maintaining healthy bones. This includes eating nutritious foods and exercising regularly. To have healthy bones, you need to get enough of the right minerals and vitamins. Most nutrition experts recommend getting these nutrients from the foods that you eat. In some cases, taking supplements may also be recommended. Doing certain types of exercise is also important for bone health. What are the nutritional recommendations for healthy bones?  Eating a well-balanced diet with plenty of calcium and vitamin D will help to protect your bones. Nutritional recommendations vary from person to person. Ask your health care provider what is healthy for you. Here are some general guidelines. Get enough calcium Calcium is the most important (essential) mineral for bone health. Most people can get enough calcium from their diet, but supplements may be recommended for people who are at risk for  osteoporosis. Good sources of calcium include: Dairy products, such as low-fat or nonfat milk, cheese, and yogurt. Dark green leafy vegetables, such as bok choy and broccoli. Foods that have calcium added to them (are fortified). Foods that may be fortified with calcium include orange juice, cereal, bread, soy beverages, and tofu products. Nuts, such as almonds. Follow these recommended amounts for daily calcium intake: Infants, 0-6 months: 200 mg. Infants, 6-12 months: 260 mg. Children, age 70-3: 700 mg. Children, age 62-8: 1,000 mg. Children, age 623-13: 1,300 mg. Teens, age 50-18: 1,300 mg. Adults, age 55-50: 1,000 mg. Adults, age 627-70: Men: 1,000 mg. Women: 1,200 mg. Adults, age 73 or older: 1,200 mg. Pregnant and breastfeeding females: Teens: 1,300 mg. Adults: 1,000 mg. Get enough vitamin D Vitamin D is the most essential vitamin for bone health. It helps the body absorb calcium. Sunlight stimulates the skin to make vitamin D, so be sure to get enough sunlight. If you live in a cold climate or you do not get outside often, your health care provider may recommend that you take vitamin D supplements. Good sources of vitamin D in your diet include: Egg yolks. Saltwater fish. Milk and cereal fortified with vitamin D. Follow these recommended amounts for daily vitamin D intake: Infants, 0-12 months: 400 international units (IU). Children and teens, age 70-18: 600 international units. Adults, age 34 or younger: 600 international units. Adults, age 62 or older: 600-1,000 international units. Get other important nutrients Other nutrients that are important for bone health include: Phosphorus. This mineral is found in meat, poultry, dairy foods, nuts, and legumes. The  recommended daily intake for adult men and adult women is 700 mg. Magnesium. This mineral is found in seeds, nuts, dark green vegetables, and legumes. The recommended daily intake for adult men is 400-420 mg. For adult women,  it is 310-320 mg. Vitamin K. This vitamin is found in green leafy vegetables. The recommended daily intake is 120 mcg for adult men and 90 mcg for adult women. What type of physical activity is best for building and maintaining healthy bones? Weight-bearing and strength-building activities are important for building and maintaining healthy bones. Weight-bearing activities cause muscles and bones to work against gravity. Strength-building activities increase the strength of the muscles that support bones. Weight-bearing and muscle-building activities include: Walking and hiking. Jogging and running. Dancing. Gym exercises. Lifting weights. Tennis and racquetball. Climbing stairs. Aerobics. Adults should get at least 30 minutes of moderate physical activity on most days. Children should get at least 60 minutes of moderate physical activity on most days. Ask your health care provider what type of exercise is best for you. How can I find out if my bone mass is low? Bone mass can be measured with an X-ray test called a bone mineral density (BMD) test. This test is recommended for all women who are age 62 or older. It may also be recommended for: Men who are age 26 or older. People who are at risk for osteoporosis because of: Having a long-term disease that weakens bones, such as kidney disease or rheumatoid arthritis. Having menopause earlier than normal. Taking medicine that weakens bones, such as steroids, thyroid hormones, or hormone treatment for breast cancer or prostate cancer. Smoking. Drinking three or more alcoholic drinks a day. Being underweight. Sedentary lifestyle. If you find that you have a low bone mass, you may be able to prevent osteoporosis or further bone loss by changing your diet and lifestyle. Where can I find more information? Bone Health & Osteoporosis Foundation: https://carlson-fletcher.info/ [employer]: www.bones.http://www.myers.net/ International Osteoporosis  Foundation: Investment banker, operational.iofbonehealth.org Summary The aging process leads to an overall loss of bone mass in the body, which can increase the likelihood of broken bones and osteoporosis. Eating a well-balanced diet with plenty of calcium and vitamin D will help to protect your bones. Weight-bearing and strength-building activities are also important for building and maintaining strong bones. Bone mass can be measured with an X-ray test called a bone mineral density (BMD) test. This information is not intended to replace advice given to you by your health care provider. Make sure you discuss any questions you have with your health care provider. Document Revised: 05/08/2021 Document Reviewed: 05/08/2021 Elsevier Patient Education  2024 Elsevier Inc. Alendronate Solution What is this medication? ALENDRONATE (a LEN droe nate) treats osteoporosis. It works by Interior and spatial designer stronger and less likely to break (fracture). It belongs to a group of medications called bisphosphonates. This medicine may be used for other purposes; ask your health care provider or pharmacist if you have questions. COMMON BRAND NAME(S): Fosamax What should I tell my care team before I take this medication? They need to know if you have any of these conditions: Bleeding disorder Cancer Dental disease Difficulty swallowing Infection (fever, chills, cough, sore throat, pain or trouble passing urine) Kidney disease Low levels of calcium or other minerals in the blood Low red blood cell counts Receiving steroids like dexamethasone or prednisone Stomach or intestine problems Trouble sitting or standing for 30 minutes An unusual or allergic reaction to alendronate, other medications, foods, dyes or preservatives Pregnant  or trying to get pregnant Breast-feeding How should I use this medication? Take this medication by mouth with a full glass of water. Take it as directed on the prescription label at the same time every day.  Use a specially marked oral syringe, spoon, or dropper to measure each dose. Ask your pharmacist if you do not have one. Household spoons are not accurate. Take the dose right after waking up. Do not eat or drink anything before taking it. Do not take it with any other drink except water. After taking it, do not eat breakfast, drink, or take any other medications or vitamins for at least 30 minutes. Sit or stand up for at least 30 minutes after you take it. Do not lie down. Keep taking it unless your care team tells you to stop. A special MedGuide will be given to you by the pharmacist with each prescription and refill. Be sure to read this information carefully each time. Talk to your care team about the use of this medication in children. Special care may be needed. Overdosage: If you think you have taken too much of this medicine contact a poison control center or emergency room at once. NOTE: This medicine is only for you. Do not share this medicine with others. What if I miss a dose? If you take your medication once a day, skip it. Take your next dose at the scheduled time the next morning. Do not take two doses on the same day. If you take your medication once a week, take the missed dose on the morning after you remember. Do not take two doses on the same day. What may interact with this medication? Aluminum hydroxide Antacids Aspirin Calcium supplements Iron supplements Magnesium supplements Medications for inflammation like ibuprofen, naproxen, and others Vitamins with minerals This list may not describe all possible interactions. Give your health care provider a list of all the medicines, herbs, non-prescription drugs, or dietary supplements you use. Also tell them if you smoke, drink alcohol, or use illegal drugs. Some items may interact with your medicine. What should I watch for while using this medication? Visit your care team for regular checks on your progress. It may be some time  before you see the benefit from this medication. Some people who take this medication have severe bone, joint, or muscle pain. This medication may also increase your risk for jaw problems or a broken thigh bone. Tell your care team right away if you have severe pain in your jaw, bones, joints, or muscles. Tell you care team if you have any pain that does not go away or that gets worse. Tell your dentist and dental surgeon that you are taking this medication. You should not have major dental surgery while on this medication. See your dentist to have a dental exam and fix any dental problems before starting this medication. Take good care of your teeth while on this medication. Make sure you see your dentist for regular follow-up appointments. You should make sure you get enough calcium and vitamin D while you are taking this medication. Discuss the foods you eat and the vitamins you take with your care team. You may need blood work done while you are taking this medication. What side effects may I notice from receiving this medication? Side effects that you should report to your care team as soon as possible: Allergic reactions--skin rash, itching, hives, swelling of the face, lips, tongue, or throat Low calcium level--muscle pain or cramps, confusion, tingling, or  numbness in the hands or feet Osteonecrosis of the jaw--pain, swelling, or redness in the mouth, numbness of the jaw, poor healing after dental work, unusual discharge from the mouth, visible bones in the mouth Pain or trouble swallowing Severe bone, joint, or muscle pain Stomach bleeding--bloody or black, tar-like stools, vomiting blood or brown material that looks like coffee grounds Side effects that usually do not require medical attention (report to your care team if they continue or are bothersome): Constipation Diarrhea Nausea Stomach pain This list may not describe all possible side effects. Call your doctor for medical advice  about side effects. You may report side effects to FDA at 1-800-FDA-1088. Where should I keep my medication? Keep out of the reach of children and pets. Store at room temperature between 20 and 25 degrees C (68 and 77 degrees F). Do not freeze. Throw away any unused medication after the expiration date. NOTE: This sheet is a summary. It may not cover all possible information. If you have questions about this medicine, talk to your doctor, pharmacist, or health care provider.  2024 Elsevier/Gold Standard (2020-11-22 00:00:00) Denosumab Injection (Osteoporosis) What is this medication? DENOSUMAB (den oh SUE mab) prevents and treats osteoporosis. It works by Interior and spatial designer stronger and less likely to break (fracture). It is a monoclonal antibody. This medicine may be used for other purposes; ask your health care provider or pharmacist if you have questions. COMMON BRAND NAME(S): Prolia What should I tell my care team before I take this medication? They need to know if you have any of these conditions: Dental or gum disease Had thyroid or parathyroid (glands located in neck) surgery Having dental surgery or a tooth pulled Kidney disease Low levels of calcium in the blood On dialysis Poor nutrition Thyroid disease Trouble absorbing nutrients from your food An unusual or allergic reaction to denosumab, other medications, foods, dyes, or preservatives Pregnant or trying to get pregnant Breastfeeding How should I use this medication? This medication is injected under the skin. It is given by your care team in a hospital or clinic setting. A special MedGuide will be given to you before each treatment. Be sure to read this information carefully each time. Talk to your care team about the use of this medication in children. Special care may be needed. Overdosage: If you think you have taken too much of this medicine contact a poison control center or emergency room at once. NOTE: This medicine  is only for you. Do not share this medicine with others. What if I miss a dose? Keep appointments for follow-up doses. It is important not to miss your dose. Call your care team if you are unable to keep an appointment. What may interact with this medication? Do not take this medication with any of the following: Other medications that contain denosumab This medication may also interact with the following: Medications that lower your chance of fighting infection Steroid medications, such as prednisone or cortisone This list may not describe all possible interactions. Give your health care provider a list of all the medicines, herbs, non-prescription drugs, or dietary supplements you use. Also tell them if you smoke, drink alcohol, or use illegal drugs. Some items may interact with your medicine. What should I watch for while using this medication? Your condition will be monitored carefully while you are receiving this medication. You may need blood work done while taking this medication. This medication may increase your risk of getting an infection. Call your care team for  advice if you get a fever, chills, sore throat, or other symptoms of a cold or flu. Do not treat yourself. Try to avoid being around people who are sick. Tell your dentist and dental surgeon that you are taking this medication. You should not have major dental surgery while on this medication. See your dentist to have a dental exam and fix any dental problems before starting this medication. Take good care of your teeth while on this medication. Make sure you see your dentist for regular follow-up appointments. This medication may cause low levels of calcium in your body. The risk of severe side effects is increased in people with kidney disease. Your care team may prescribe calcium and vitamin D to help prevent low calcium levels while you take this medication. It is important to take calcium and vitamin D as directed by your care  team. Talk to your care team if you may be pregnant. Serious birth defects may occur if you take this medication during pregnancy and for 5 months after the last dose. You will need a negative pregnancy test before starting this medication. Contraception is recommended while taking this medication and for 5 months after the last dose. Your care team can help you find the option that works for you. Talk to your care team before breastfeeding. Changes to your treatment plan may be needed. What side effects may I notice from receiving this medication? Side effects that you should report to your care team as soon as possible: Allergic reactions--skin rash, itching, hives, swelling of the face, lips, tongue, or throat Infection--fever, chills, cough, sore throat, wounds that don't heal, pain or trouble when passing urine, general feeling of discomfort or being unwell Low calcium level--muscle pain or cramps, confusion, tingling, or numbness in the hands or feet Osteonecrosis of the jaw--pain, swelling, or redness in the mouth, numbness of the jaw, poor healing after dental work, unusual discharge from the mouth, visible bones in the mouth Severe bone, joint, or muscle pain Skin infection--skin redness, swelling, warmth, or pain Side effects that usually do not require medical attention (report these to your care team if they continue or are bothersome): Back pain Headache Joint pain Muscle pain Pain in the hands, arms, legs, or feet Runny or stuffy nose Sore throat This list may not describe all possible side effects. Call your doctor for medical advice about side effects. You may report side effects to FDA at 1-800-FDA-1088. Where should I keep my medication? This medication is given in a hospital or clinic. It will not be stored at home. NOTE: This sheet is a summary. It may not cover all possible information. If you have questions about this medicine, talk to your doctor, pharmacist, or health  care provider.  2024 Elsevier/Gold Standard (2022-12-30 00:00:00)

## 2023-09-21 NOTE — Assessment & Plan Note (Signed)
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, focal vascular involvement, posterior margin involvement, 1/2 sentinel node positive, T1 cN1 M0 stage II a, Mammaprint luminal type B, high risk, disease free survival with chemotherapy and hormonal therapy 88% versus 76% with hormonal therapy alone;   Treatment summary: Adjuvant chemotherapy with Taxotere and Cytoxan every 3 weeks 4 cycles started 01/15/15 completed 03/19/15, followed by adjuvant radiation therapy 04/10/15- 05/25/2015 Started anastrozole 5 years 05/31/2015 stopped 04/03/2016 due to tendinitis involving bilateral wrists Started letrozole 04/14/2016; changed to exemestane October 2017   Exemestane toxicities: Marked improvement in arthralgias and myalgias.  She will finish this treatment in about 45 days.    Neuropathy: Improving slowly with time. We'll continue to watch and observe. Bone density 07/2023: Her bone density is worse compared to her bone density in August 2022.  She has osteoporosis in the forearm that shows a T-score of -3.2, previously -2.8. We discussed her options which include stopping the exemestane, optimizing bone health including calcium, vitamin D, weightbearing exercises, or taking bisphosphonate therapy.  I gave her information on Fosamax and Prolia today.  We reviewed the risks and benefits in detail.  She is going to think about this and will call our office to let us know.   Breast Cancer Surveillance: 1. Breast exam 08/03/2023: No abnormalities 2. Mammograms: 07/29/2022 at [facility]: Benign, breast density category B  She is returning to our office on an as-needed basis per Dr. Earmon Phoenix last conversation with her.  If she decides to pursue bisphosphonate therapy that will change, however she is thinking about this and will let us know.

## 2023-10-26 DIAGNOSIS — L820 Inflamed seborrheic keratosis: Secondary | ICD-10-CM | POA: Diagnosis not present

## 2023-11-06 DIAGNOSIS — Z23 Encounter for immunization: Secondary | ICD-10-CM | POA: Diagnosis not present

## 2023-12-11 DIAGNOSIS — Z23 Encounter for immunization: Secondary | ICD-10-CM | POA: Diagnosis not present

## 2024-01-31 ENCOUNTER — Encounter: Payer: Self-pay | Admitting: Internal Medicine

## 2024-01-31 NOTE — Patient Instructions (Addendum)
      Blood work was ordered.       Medications changes include :   None    A referral was ordered and someone will call you to schedule an appointment.     Return in about 1 year (around 02/01/2025) for follow up.

## 2024-01-31 NOTE — Progress Notes (Unsigned)
 Subjective:    Patient ID: Shawna Marquez, female    DOB: [birth year], 82 y.o.   MRN: [id number]     HPI Karista is here for follow up of her chronic medical problems.  Stopped aromasin and has started having ear ringing.  Left ear itches inside.  No change in hearing - hearing is very good.  Tinnitus is intermittent.     Pain in right breast - middle of breast and lateral breast - tender to touch.  Started end of Jan.  Last Arlee Muslim was in August of last year.  Urge incontinence - ? From stress.  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  No change in meds, diet, drinks. Symptoms are mild.   Medications and allergies reviewed with patient and updated if appropriate.  Current Outpatient Medications on File Prior to Visit  Medication Sig Dispense Refill   aspirin EC 81 MG tablet Take 81 mg by mouth at bedtime. Every other day     Calcium Citrate-Vitamin D (CALCIUM CITRATE + D3 PO) Take 1 tablet by mouth 2 (two) times daily. Calcium 500 mg, Vitamin D 800 units     cetirizine (ZYRTEC) 10 MG tablet Take 10 mg by mouth daily.     cholecalciferol (VITAMIN D) 1000 UNITS tablet Take 1,000 Units by mouth daily with lunch.      Coenzyme Q10 (COQ10) 200 MG CAPS Take 200 mg by mouth every other day.     fluticasone (FLONASE) 50 MCG/ACT nasal spray SPRAY 2 SPRAYS INTO EACH NOSTRIL EVERY DAY 48 mL 3   folic acid (FOLVITE) 400 MCG tablet Take 400 mcg by mouth daily with lunch.      hydrocortisone cream 1 % Apply 1 application topically daily as needed for itching (eczema).     levothyroxine (SYNTHROID) 125 MCG tablet Take 1 tablet (125 mcg total) by mouth daily before breakfast. 90 tablet 3   Multiple Vitamin (MULTIVITAMIN WITH MINERALS) TABS tablet Take 1 tablet by mouth daily with lunch.     Omega-3 Fatty Acids (FISH OIL) 1000 MG CAPS Take 1,000 mg by mouth daily with lunch.     Turmeric 450 MG CAPS Take 450 mg by mouth daily with lunch.      vitamin B-12 (CYANOCOBALAMIN) 500 MCG  tablet Take 500 mcg by mouth daily.     vitamin C (ASCORBIC ACID) 500 MG tablet Take 500 mg by mouth 2 (two) times daily.     No current facility-administered medications on file prior to visit.     Review of Systems  Constitutional:  Positive for fatigue. Negative for fever.  HENT:  Positive for tinnitus. Negative for hearing loss.   Respiratory:  Negative for cough, shortness of breath and wheezing.   Cardiovascular:  Negative for chest pain, palpitations and leg swelling.  Gastrointestinal:  Negative for abdominal pain, blood in stool, constipation and diarrhea.       No gerd  Neurological:  Negative for light-headedness and headaches.  Psychiatric/Behavioral:  Positive for sleep disturbance (disturbed - interrupted).        Objective:   Vitals:   02/02/24 1356  BP: 132/70  Pulse: 75  Temp: 98 F (36.7 C)  SpO2: 97%   BP Readings from Last 3 Encounters:  02/02/24 132/70  09/21/23 126/64  08/04/23 118/78   Wt Readings from Last 3 Encounters:  02/02/24 162 lb 4.8 oz (73.6 kg)  09/21/23 164 lb 12.8 oz (74.8 kg)  08/04/23 163 lb (  73.9 kg)   Body mass index is 31.18 kg/m.    Physical Exam Constitutional:      General: She is not in acute distress.    Appearance: Normal appearance.  HENT:     Head: Normocephalic and atraumatic.  Eyes:     Conjunctiva/sclera: Conjunctivae normal.  Cardiovascular:     Rate and Rhythm: Normal rate and regular rhythm.     Heart sounds: Normal heart sounds.  Pulmonary:     Effort: Pulmonary effort is normal. No respiratory distress.     Breath sounds: Normal breath sounds. No wheezing.  Abdominal:     General: There is no distension.     Palpations: Abdomen is soft.     Tenderness: There is no abdominal tenderness. There is no guarding or rebound.  Musculoskeletal:     Cervical back: Neck supple.     Right lower leg: No edema.     Left lower leg: No edema.  Lymphadenopathy:     Cervical: No cervical adenopathy.  Skin:     General: Skin is warm and dry.     Findings: No rash.  Neurological:     Mental Status: She is alert. Mental status is at baseline.  Psychiatric:        Mood and Affect: Mood normal.        Behavior: Behavior normal.        Lab Results  Component Value Date   WBC 6.3 01/30/2023   HGB 14.5 01/30/2023   HCT 43.8 01/30/2023   PLT 202.0 01/30/2023   GLUCOSE 75 01/30/2023   CHOL 162 05/14/2021   TRIG 90.0 05/14/2021   HDL 52.10 05/14/2021   LDLCALC 92 05/14/2021   ALT 16 01/30/2023   AST 21 01/30/2023   NA 141 01/30/2023   K 4.5 01/30/2023   CL 101 01/30/2023   CREATININE 0.77 01/30/2023   BUN 19 01/30/2023   CO2 31 01/30/2023   TSH 0.50 01/30/2023     Assessment & Plan:    See Problem List for Assessment and Plan of chronic medical problems.

## 2024-02-02 ENCOUNTER — Ambulatory Visit (INDEPENDENT_AMBULATORY_CARE_PROVIDER_SITE_OTHER): Payer: Medicare Other | Admitting: Internal Medicine

## 2024-02-02 VITALS — BP 132/70 | HR 75 | Temp 98.0°F | Ht 60.5 in | Wt 162.3 lb

## 2024-02-02 DIAGNOSIS — E039 Hypothyroidism, unspecified: Secondary | ICD-10-CM | POA: Diagnosis not present

## 2024-02-02 DIAGNOSIS — M810 Age-related osteoporosis without current pathological fracture: Secondary | ICD-10-CM | POA: Diagnosis not present

## 2024-02-02 DIAGNOSIS — N644 Mastodynia: Secondary | ICD-10-CM | POA: Diagnosis not present

## 2024-02-02 DIAGNOSIS — Z136 Encounter for screening for cardiovascular disorders: Secondary | ICD-10-CM

## 2024-02-02 DIAGNOSIS — R7989 Other specified abnormal findings of blood chemistry: Secondary | ICD-10-CM | POA: Insufficient documentation

## 2024-02-02 NOTE — Assessment & Plan Note (Signed)
Chronic  Clinically euthyroid Check tsh and will titrate med dose if needed Currently taking levothyroxine 125 mcg daily  

## 2024-02-02 NOTE — Assessment & Plan Note (Signed)
 Acute Approximately 1 month ago started having pain in the left breast on the lateral aspect and in the central breast Pain has proven persistent There is some tenderness under the nipple and at 10:00 No obvious lump, but she does have baseline lump associated with symptoms secondary to previous radiation Diagnostic mammogram and ultrasound ordered

## 2024-02-02 NOTE — Assessment & Plan Note (Signed)
 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

## 2024-02-02 NOTE — Assessment & Plan Note (Signed)
 Chronic History of low B12 level Check B12 level

## 2024-02-03 LAB — COMPREHENSIVE METABOLIC PANEL
ALT: 17 U/L (ref 0–35)
AST: 21 U/L (ref 0–37)
Albumin: 4 g/dL (ref 3.5–5.2)
Alkaline Phosphatase: 68 U/L (ref 39–117)
BUN: 19 mg/dL (ref 6–23)
CO2: 28 meq/L (ref 19–32)
Calcium: 9.1 mg/dL (ref 8.4–10.5)
Chloride: 103 meq/L (ref 96–112)
Creatinine, Ser: 0.76 mg/dL (ref 0.40–1.20)
GFR: 71.66 mL/min (ref >60.00)
Glucose, Bld: 90 mg/dL (ref 70–99)
Potassium: 4.6 meq/L (ref 3.5–5.1)
Sodium: 139 meq/L (ref 135–145)
Total Bilirubin: 0.5 mg/dL (ref 0.2–1.2)
Total Protein: 6.8 g/dL (ref 6.0–8.3)

## 2024-02-03 LAB — CBC WITH DIFFERENTIAL/PLATELET
Basophils Absolute: 0.1 10*3/uL (ref 0.0–0.1)
Basophils Relative: 1.1 % (ref 0.0–3.0)
Eosinophils Absolute: 0.2 10*3/uL (ref 0.0–0.7)
Eosinophils Relative: 4 % (ref 0.0–5.0)
HCT: 40.3 % (ref 36.0–46.0)
Hemoglobin: 13.3 g/dL (ref 12.0–15.0)
Lymphocytes Relative: 24.3 % (ref 12.0–46.0)
Lymphs Abs: 1.2 10*3/uL (ref 0.7–4.0)
MCHC: 33 g/dL (ref 30.0–36.0)
MCV: 96.2 fL (ref 78.0–100.0)
Monocytes Absolute: 0.5 10*3/uL (ref 0.1–1.0)
Monocytes Relative: 10 % (ref 3.0–12.0)
Neutro Abs: 3.1 10*3/uL (ref 1.4–7.7)
Neutrophils Relative %: 60.6 % (ref 43.0–77.0)
Platelets: 191 10*3/uL (ref 150.0–400.0)
RBC: 4.19 Mil/uL (ref 3.87–5.11)
RDW: 13 % (ref 11.5–15.5)
WBC: 5.1 10*3/uL (ref 4.0–10.5)

## 2024-02-03 LAB — LIPID PANEL
Cholesterol: 158 mg/dL (ref 0–200)
HDL: 53.8 mg/dL
LDL Cholesterol: 89 mg/dL (ref 0–99)
NonHDL: 104.56
Total CHOL/HDL Ratio: 3
Triglycerides: 79 mg/dL (ref 0.0–149.0)
VLDL: 15.8 mg/dL (ref 0.0–40.0)

## 2024-02-03 LAB — VITAMIN B12: Vitamin B-12: 453 pg/mL (ref 211–911)

## 2024-02-03 LAB — TSH: TSH: 0.27 u[IU]/mL — ABNORMAL LOW (ref 0.35–5.50)

## 2024-02-03 LAB — VITAMIN D 25 HYDROXY (VIT D DEFICIENCY, FRACTURES): VITD: 33.49 ng/mL (ref 30.00–100.00)

## 2024-02-04 ENCOUNTER — Encounter: Payer: Self-pay | Admitting: Internal Medicine

## 2024-02-04 ENCOUNTER — Other Ambulatory Visit: Payer: Self-pay | Admitting: Internal Medicine

## 2024-02-04 DIAGNOSIS — E039 Hypothyroidism, unspecified: Secondary | ICD-10-CM

## 2024-02-04 DIAGNOSIS — J301 Allergic rhinitis due to pollen: Secondary | ICD-10-CM

## 2024-02-04 MED ORDER — LEVOTHYROXINE SODIUM 125 MCG PO TABS: ORAL_TABLET | ORAL | Status: DC

## 2024-02-16 ENCOUNTER — Encounter: Payer: Self-pay | Admitting: Internal Medicine

## 2024-02-17 DIAGNOSIS — N644 Mastodynia: Secondary | ICD-10-CM | POA: Diagnosis not present

## 2024-02-17 LAB — HM MAMMOGRAPHY

## 2024-02-18 DIAGNOSIS — H04123 Dry eye syndrome of bilateral lacrimal glands: Secondary | ICD-10-CM | POA: Diagnosis not present

## 2024-02-18 DIAGNOSIS — H52203 Unspecified astigmatism, bilateral: Secondary | ICD-10-CM | POA: Diagnosis not present

## 2024-02-18 DIAGNOSIS — H524 Presbyopia: Secondary | ICD-10-CM | POA: Diagnosis not present

## 2024-02-18 DIAGNOSIS — H43813 Vitreous degeneration, bilateral: Secondary | ICD-10-CM | POA: Diagnosis not present

## 2024-02-18 DIAGNOSIS — H5310 Unspecified subjective visual disturbances: Secondary | ICD-10-CM | POA: Diagnosis not present

## 2024-02-18 DIAGNOSIS — H02831 Dermatochalasis of right upper eyelid: Secondary | ICD-10-CM | POA: Diagnosis not present

## 2024-02-18 DIAGNOSIS — H02834 Dermatochalasis of left upper eyelid: Secondary | ICD-10-CM | POA: Diagnosis not present

## 2024-03-02 ENCOUNTER — Other Ambulatory Visit: Payer: Self-pay | Admitting: Internal Medicine

## 2024-03-02 DIAGNOSIS — E039 Hypothyroidism, unspecified: Secondary | ICD-10-CM

## 2024-04-11 ENCOUNTER — Ambulatory Visit (HOSPITAL_BASED_OUTPATIENT_CLINIC_OR_DEPARTMENT_OTHER)
Admission: RE | Admit: 2024-04-11 | Discharge: 2024-04-11 | Disposition: A | Discharge status: Home / Self Care | Payer: Medicare Other | Source: Ambulatory Visit | Attending: Internal Medicine | Admitting: Internal Medicine

## 2024-04-11 DIAGNOSIS — Z136 Encounter for screening for cardiovascular disorders: Secondary | ICD-10-CM | POA: Insufficient documentation

## 2024-04-13 ENCOUNTER — Encounter: Payer: Self-pay | Admitting: Internal Medicine

## 2024-04-13 DIAGNOSIS — E039 Hypothyroidism, unspecified: Secondary | ICD-10-CM

## 2024-04-15 ENCOUNTER — Other Ambulatory Visit: Payer: Self-pay | Admitting: Internal Medicine

## 2024-04-15 ENCOUNTER — Encounter: Payer: Self-pay | Admitting: Internal Medicine

## 2024-04-15 DIAGNOSIS — E7800 Pure hypercholesterolemia, unspecified: Secondary | ICD-10-CM

## 2024-04-15 MED ORDER — ROSUVASTATIN CALCIUM 5 MG PO TABS: 5.0000 mg | ORAL_TABLET | Freq: Every day | ORAL | 3 refills | Status: AC

## 2024-04-16 NOTE — Addendum Note (Signed)
 Addended by: Colene Dauphin on: 04/16/2024 09:04 AM   Modules accepted: Orders

## 2024-04-28 ENCOUNTER — Ambulatory Visit: Payer: Medicare Other | Admitting: Internal Medicine

## 2024-05-10 ENCOUNTER — Ambulatory Visit (INDEPENDENT_AMBULATORY_CARE_PROVIDER_SITE_OTHER): Payer: Medicare Other

## 2024-05-10 VITALS — Ht 60.0 in | Wt 157.0 lb

## 2024-05-10 DIAGNOSIS — Z0000 Encounter for general adult medical examination without abnormal findings: Secondary | ICD-10-CM

## 2024-05-10 NOTE — Patient Instructions (Signed)
 Shawna Marquez , Thank you for taking time out of your busy schedule to complete your Annual Wellness Visit with me. I enjoyed our conversation and look forward to speaking with you again next year. I, as well as your care team,  appreciate your ongoing commitment to your health goals. Please review the following plan we discussed and let me know if I can assist you in the future. Your Game plan/ To Do List     Follow up Visits: Next Medicare AWV with our clinical staff: 05/12/2025   Have you seen your provider in the last 6 months (3 months if uncontrolled diabetes)? Yes Next Office Visit with your provider: 02/08/2025 at 9:30am - Physical  Clinician Recommendations:  Aim for 30 minutes of exercise or brisk walking, 6-8 glasses of water, and 5 servings of fruits and vegetables each day. Educated and advised on getting the Shingles and COVID vaccines at local pharmacy in 2025.      This is a list of the screening recommended for you and due dates:  Health Maintenance  Topic Date Due   Zoster (Shingles) Vaccine (1 of 2) Never done   COVID-19 Vaccine (5 - 2024-25 season) 08/09/2023   Flu Shot  07/08/2024   Mammogram  02/16/2025   Medicare Annual Wellness Visit  05/10/2025   DEXA scan (bone density measurement)  08/03/2025   DTaP/Tdap/Td vaccine (4 - Td or Tdap) 10/20/2032   Pneumonia Vaccine  Completed   HPV Vaccine  Aged Out   Meningitis B Vaccine  Aged Out    Advanced directives: (Copy Requested) Please bring a copy of your health care power of attorney and living will to the office to be added to your chart at your convenience. You can mail to [facility] [address] [place], [place] [address] or email to [email or web address] Advance Care Planning is important because it:  [x]  Makes sure you receive the medical care that is consistent with your values, goals, and preferences  [x]  It provides guidance to your family and loved ones and reduces their decisional burden  about whether or not they are making the right decisions based on your wishes.  Follow the link provided in your after visit summary or read over the paperwork we have mailed to you to help you started getting your Advance Directives in place. If you need assistance in completing these, please reach out to us  so that we can help you!

## 2024-05-10 NOTE — Progress Notes (Signed)
 Subjective:   Shawna Marquez is a 83 y.o. who presents for a Medicare Wellness preventive visit.  As a reminder, Annual Wellness Visits don't include a physical exam, and some assessments may be limited, especially if this visit is performed virtually. We may recommend an in-person follow-up visit with your provider if needed.  Visit Complete: Virtual I connected with  Pryor Browning on 05/10/24 by a audio enabled telemedicine application and verified that I am speaking with the correct person using two identifiers.  Patient Location: Home  Provider Location: Office/Clinic  I discussed the limitations of evaluation and management by telemedicine. The patient expressed understanding and agreed to proceed.  Vital Signs: Because this visit was a virtual/telehealth visit, some criteria may be missing or patient reported. Any vitals not documented were not able to be obtained and vitals that have been documented are patient reported.  VideoDeclined- This patient declined Librarian, academic. Therefore the visit was completed with audio only.  Persons Participating in Visit: Patient.  AWV Questionnaire: Yes: Patient Medicare AWV questionnaire was completed by the patient on 05/06/2024; I have confirmed that all information answered by patient is correct and no changes since this date.  Cardiac Risk Factors include: advanced age (>51men, >53 women);obesity (BMI >30kg/m2)     Objective:     Today's Vitals   05/10/24 1417  Weight: 157 lb (71.2 kg)  Height: 5' (1.524 m)   Body mass index is 30.66 kg/m.     05/10/2024    2:16 PM 05/08/2023    2:33 PM 04/16/2022    1:06 PM 12/11/2020    9:49 AM 02/09/2019    9:00 AM 12/11/2016    2:06 PM 08/30/2015   12:09 PM  Advanced Directives  Does Patient Have a Medical Advance Directive? Yes Yes Yes Yes Yes No Yes  Type of Estate agent of [place];Living will Healthcare Power of [place];Living will Living  will;Healthcare Power of Attorney Living will;Healthcare Power of [employer] Power of [place];Living will  Healthcare Power of Attorney  Does patient want to make changes to medical advance directive?   No - Patient declined  No - Patient declined    Copy of Healthcare Power of Attorney in Chart? No - copy requested No - copy requested No - copy requested No - copy requested No - copy requested  No - copy requested    Current Medications (verified) Outpatient Encounter Medications as of 05/10/2024  Medication Sig   aspirin EC 81 MG tablet Take 81 mg by mouth at bedtime. Every other day   Calcium  Citrate-Vitamin D  (CALCIUM  CITRATE + D3 PO) Take 1 tablet by mouth 2 (two) times daily. Calcium  500 mg, Vitamin D  800 units   cetirizine (ZYRTEC) 10 MG tablet Take 10 mg by mouth daily.   cholecalciferol (VITAMIN D ) 1000 UNITS tablet Take 1,000 Units by mouth daily with lunch.    Coenzyme Q10 (COQ10) 200 MG CAPS Take 200 mg by mouth every other day.   fluticasone  (FLONASE ) 50 MCG/ACT nasal spray SPRAY 2 SPRAYS INTO EACH NOSTRIL EVERY DAY   folic acid (FOLVITE) 400 MCG tablet Take 400 mcg by mouth daily with lunch.    hydrocortisone cream 1 % Apply 1 application topically daily as needed for itching (eczema).   levothyroxine  (SYNTHROID ) 125 MCG tablet TAKE 1 TABLET BY MOUTH DAILY BEFORE BREAKFAST.   Multiple Vitamin (MULTIVITAMIN WITH MINERALS) TABS tablet Take 1 tablet by mouth daily with lunch.   Omega-3 Fatty  Acids (FISH OIL) 1000 MG CAPS Take 1,000 mg by mouth daily with lunch.   rosuvastatin  (CRESTOR ) 5 MG tablet Take 1 tablet (5 mg total) by mouth daily.   Turmeric 450 MG CAPS Take 450 mg by mouth daily with lunch.    vitamin B-12 (CYANOCOBALAMIN ) 500 MCG tablet Take 500 mcg by mouth daily.   vitamin C (ASCORBIC ACID) 500 MG tablet Take 500 mg by mouth 2 (two) times daily.   No facility-administered encounter medications on file as of 05/10/2024.    Allergies (verified) Patient has no  known allergies.   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   Complication of anesthesia    bp will drop,hard to wake up   DIVERTICULITIS, HX OF 10/12/2007   Family history of breast cancer    HYPOTHYROIDISM, PRIMARY 10/12/2007   OSTEOARTHRITIS 09/09/2007   back    Radiation 04/10/15-05/25/15   Right Breast   Sleep apnea    USES cpap NIGHTLY   Wears glasses    Past Surgical History:  Procedure Laterality Date   ABDOMINAL HYSTERECTOMY  1977   COLON SURGERY  2006   part colectomy   COLONOSCOPY  2007   colovaginal fistula  2007   colon resection   EYE SURGERY     both cataracts   HERNIA REPAIR  2007   ventral   MASTECTOMY Left 1997   lt mast-axillary node dissection   PORTACATH PLACEMENT Left 01/01/2015   Procedure: INSERTION PORT-A-CATH;  Surgeon: Enid Harry, MD;  Location: [facility] OR;  Service: General;  Laterality: Left;   RADIOACTIVE SEED GUIDED PARTIAL MASTECTOMY WITH AXILLARY SENTINEL LYMPH NODE BIOPSY Right 11/28/2014   Procedure: RADIOACTIVE SEED GUIDED RIGHT LUMPECTOMY WITH RIGHT AXILLARY SENTINEL LYMPH NODE BIOPSY;  Surgeon: Enid Harry, MD;  Location: [facility];  Service: General;  Laterality: Right;   RE-EXCISION OF BREAST CANCER,SUPERIOR MARGINS Right 01/01/2015   Procedure: RIGHT BREAST MARGIN EXCISION;  Surgeon: Enid Harry, MD;  Location: [facility] OR;  Service: General;  Laterality: Right;   TONSILLECTOMY     WISDOM TOOTH EXTRACTION     Family History  Problem Relation Age of Onset   Breast cancer Paternal Grandmother 65   Lung cancer Father 42       smoker   Prostate cancer Cousin        paternal cousin    Heart disease Neg Hx        family hx   Cancer Neg Hx        lung ca   COPD Neg Hx        family hx   Colon cancer Neg Hx    Social History    Socioeconomic History   Marital status: Married    Spouse name: Not on file   Number of children: 3   Years of education: Not on file   Highest education level: Bachelor's degree (e.g., BA, AB, BS)  Occupational History   Occupation: retired  Tobacco Use   Smoking status: Never   Smokeless tobacco: Never  Vaping Use   Vaping status: Never Used  Substance and Sexual Activity   Alcohol use: Not Currently    Alcohol/week: 1.0 standard drink of alcohol    Types: 1 Cans of beer per week    Comment: occassional beer   Drug use: No  Sexual activity: Yes  Other Topics Concern   Not on file  Social History Narrative   Married   Social Drivers of Health   Financial Resource Strain: Low Risk  (05/10/2024)   Overall Financial Resource Strain (CARDIA)    Difficulty of Paying Living Expenses: Not hard at all  Food Insecurity: No Food Insecurity (05/10/2024)   Hunger Vital Sign    Worried About Running Out of Food in the Last Year: Never true    Ran Out of Food in the Last Year: Never true  Transportation Needs: No Transportation Needs (05/10/2024)   PRAPARE - Administrator, Civil Service (Medical): No    Lack of Transportation (Non-Medical): No  Physical Activity: Insufficiently Active (05/10/2024)   Exercise Vital Sign    Days of Exercise per Week: 4 days    Minutes of Exercise per Session: 20 min  Stress: No Stress Concern Present (05/10/2024)   [employer] of Occupational Health - Occupational Stress Questionnaire    Feeling of Stress : Only a little  Social Connections: Moderately Integrated (05/10/2024)   Social Connection and Isolation Panel [NHANES]    Frequency of Communication with Friends and Family: More than three times a week    Frequency of Social Gatherings with Friends and Family: Twice a week    Attends Religious Services: Never    Database administrator or Organizations: Yes    Attends Engineer, structural: More than 4 times per year     Marital Status: Married    Tobacco Counseling Counseling given: No    Clinical Intake:  Pre-visit preparation completed: Yes  Pain : No/denies pain     BMI - recorded: 30.66 Nutritional Status: BMI > 30  Obese Nutritional Risks: None Diabetes: No  No results found for: "HGBA1C"   How often do you need to have someone help you when you read instructions, pamphlets, or other written materials from your doctor or pharmacy?: 1 - Never  Interpreter Needed?: No  Information entered by :: Kandy Orris, CMA   Activities of Daily Living     05/10/2024    2:20 PM 05/06/2024   10:19 AM  In your present state of health, do you have any difficulty performing the following activities:  Hearing? 0 0  Vision? 0 0  Difficulty concentrating or making decisions? 0 0  Walking or climbing stairs? 0 0  Dressing or bathing? 0 0  Doing errands, shopping? 0 0  Preparing Food and eating ? N N  Using the Toilet? N N  In the past six months, have you accidently leaked urine? N N  Do you have problems with loss of bowel control? N N  Managing your Medications? N N  Managing your Finances? N N  Housekeeping or managing your Housekeeping? N N    Patient Care Team: Colene Dauphin, MD as PCP - General (Internal Medicine) Enid Harry, MD as Consulting Physician (General Surgery) Cameron Cea, MD as Consulting Physician (Hematology and Oncology) Beverlee Bucco, MD as Consulting Physician (Radiation Oncology) Alanna Alley, NP (Inactive) as Nurse Practitioner (Nurse Practitioner) Dama Duffel, NP as Nurse Practitioner (Nurse Practitioner) Rudine Cos, MD as Consulting Physician (Ophthalmology) Denman Fischer, MD as Consulting Physician (Dermatology) Faustina Hood, MD as Consulting Physician (Pulmonary Disease)  I have updated your Care Teams any recent Medical Services you may have received from other providers in the past year.     Assessment:    This is a  routine wellness examination for [place].  Hearing/Vision screen Hearing Screening - Comments:: Denies hearing difficulties   Vision Screening - Comments:: Wears rx glasses - up to date with routine eye exams with Dr Roslynn Coombes   Goals Addressed               This Visit's Progress     Patient Stated (pt-stated)        Patient stated she plans to continue eating healthy and stay active as much as possible       Depression Screen     05/10/2024    2:20 PM 05/08/2023    2:35 PM 01/30/2023    3:39 PM 04/16/2022    1:10 PM 12/24/2021   12:27 PM 12/11/2020    9:52 AM 05/23/2020    8:27 AM  PHQ 2/9 Scores  PHQ - 2 Score 0 0 0 0 0 0 0  PHQ- 9 Score 0    0      Fall Risk     05/10/2024    2:20 PM 05/06/2024   10:19 AM 05/07/2023    2:01 PM 05/04/2023   10:13 AM 01/30/2023    3:39 PM  Fall Risk   Falls in the past year? 0 0 1 1 1   Comment   slipped    Number falls in past yr: 0  0 0 0  Injury with Fall? 0  1 1 1   Comment   teeth split mouth    Risk for fall due to : No Fall Risks  Medication side effect  History of fall(s)  Follow up Falls evaluation completed;Falls prevention discussed  Falls prevention discussed;Education provided;Falls evaluation completed  Falls evaluation completed    MEDICARE RISK AT HOME:  Medicare Risk at Home Any stairs in or around the home?: Yes If so, are there any without handrails?: No Home free of loose throw rugs in walkways, pet beds, electrical cords, etc?: Yes Adequate lighting in your home to reduce risk of falls?: Yes Life alert?: No Use of a cane, walker or w/c?: No Grab bars in the bathroom?: Yes Shower chair or bench in shower?: Yes Elevated toilet seat or a handicapped toilet?: Yes  TIMED UP AND GO:  Was the test performed?  No  Cognitive Function: 6CIT completed        05/10/2024    2:23 PM 05/08/2023    2:36 PM 04/16/2022    1:16 PM 12/11/2020    9:58 AM  6CIT Screen  What Year? 0 points 0 points 0 points 0 points  What month? 0  points 0 points 0 points 0 points  What time? 0 points 0 points 0 points 0 points  Count back from 20 0 points 0 points 0 points 0 points  Months in reverse 0 points 0 points 0 points 0 points  Repeat phrase 0 points 0 points 0 points 0 points  Total Score 0 points 0 points 0 points 0 points    Immunizations Immunization History  Administered Date(s) Administered   Fluad Quad(high Dose 65+) 10/18/2019, 10/24/2020, 10/06/2022   Influenza Split 10/10/2013   Influenza Whole 09/07/2000   Influenza, High Dose Seasonal PF 10/27/2014, 09/27/2015, 10/09/2016, 09/24/2017, 08/28/2021   Influenza-Unspecified 10/15/2018, 11/06/2023   PFIZER(Purple Top)SARS-COV-2 Vaccination 01/28/2020, 02/21/2020, 10/03/2020   Pfizer Covid-19 Vaccine Bivalent Booster 41yrs & up 10/28/2021   Pneumococcal Conjugate-13 02/24/2014   Pneumococcal Polysaccharide-23 02/06/2004   Td 03/18/1996   Tdap 02/08/2013, 10/20/2022    Screening Tests Health Maintenance  Topic  Date Due   Zoster Vaccines- Shingrix (1 of 2) Never done   COVID-19 Vaccine (5 - 2024-25 season) 08/09/2023   INFLUENZA VACCINE  07/08/2024   MAMMOGRAM  02/16/2025   Medicare Annual Wellness (AWV)  05/10/2025   DEXA SCAN  08/03/2025   DTaP/Tdap/Td (4 - Td or Tdap) 10/20/2032   Pneumonia Vaccine 55+ Years old  Completed   HPV VACCINES  Aged Out   Meningococcal B Vaccine  Aged Out    Health Maintenance  Health Maintenance Due  Topic Date Due   Zoster Vaccines- Shingrix (1 of 2) Never done   COVID-19 Vaccine (5 - 2024-25 season) 08/09/2023   Health Maintenance Items Addressed: 05/10/2024   Additional Screening:  Vision Screening: Recommended annual ophthalmology exams for early detection of glaucoma and other disorders of the eye. Would you like a referral to an eye doctor? No    Dental Screening: Recommended annual dental exams for proper oral hygiene  Community Resource Referral / Chronic Care Management: CRR required this visit?  No    CCM required this visit?  No   Plan:    I have personally reviewed and noted the following in the patient's chart:   Medical and social history Use of alcohol, tobacco or illicit drugs  Current medications and supplements including opioid prescriptions. Patient is not currently taking opioid prescriptions. Functional ability and status Nutritional status Physical activity Advanced directives List of other physicians Hospitalizations, surgeries, and ER visits in previous 12 months Vitals Screenings to include cognitive, depression, and falls Referrals and appointments  In addition, I have reviewed and discussed with patient certain preventive protocols, quality metrics, and best practice recommendations. A written personalized care plan for preventive services as well as general preventive health recommendations were provided to patient.   Patria Bookbinder, CMA   05/10/2024   After Visit Summary: (MyChart) Due to this being a telephonic visit, the after visit summary with patients personalized plan was offered to patient via MyChart   Notes: Nothing significant to report at this time.

## 2024-06-25 NOTE — Progress Notes (Unsigned)
 HPI female never smoker followed for OSA, complicated by allergic rhinitis, history of right breast cancer, hypothyroid NPSG 04/21/11- AHI 47/ hr, desaturation to 85%, CPAP titrated to 8  -----------------------------------------------------------------------------------------------------------   04/28/23- 83 year old female never smoker followed for OSA, complicated by allergic rhinitis, history of right breast cancer, hypothyroid CPAP auto 5-12/ [employer]     replaced 2019 Download- compliance 100%, AHI 1/ hr Body weight today- 164 lbs Download reviewed.  She has been noticing dry mouth for the last 5 or 6 weeks and we discussed possible relation to weather/humidity/air conditioning.  Discussed adjustment of humidifier and trial of Biotene mouthwash. She is eligible to replace old machine.  06/27/24- 83 year old female never smoker followed for OSA, complicated by allergic rhinitis, history of right breast cancer, hypothyroid CPAP auto 5-12/ [employer]     replaced 04/28/23 Download- compliance-97%, AHI 4.4/hr Body weight today- 162 lbs -----Sleeping well.  CPAP working good. Discussed the use of AI scribe software for clinical note transcription with the patient, who gave verbal consent to proceed.  History of Present Illness   Shawna Marquez is an 83 year old female with sleep apnea who presents for a follow-up on CPAP therapy.  She sleeps well and breathes comfortably at night with her CPAP machine set to a pressure range between five and twelve centimeters of water, predominantly around ten centimeters. She experiences less than five breakthrough apneas per hour on her CPAP, compared to her initial sleep study score of forty-seven apneas per hour.   She denies changes of concern in her health otherwise.     Assessment and Plan:    Obstructive sleep apnea Obstructive sleep apnea well-controlled with CPAP. Average apneas per hour significantly reduced from 47 to less than 5. - Continue CPAP  therapy with settings 5-12 cm H2O.     ROS-see HPI + = positive Constitutional:    weight loss, night sweats, fevers, chills, fatigue, lassitude. HEENT:    headaches, difficulty swallowing, tooth/dental problems, sore throat,       Sneezing,+ itching, ear ache, +nasal congestion, post nasal drip, snoring CV:    chest pain, orthopnea, PND, swelling in lower extremities, anasarca,                                               dizziness, palpitations Resp:   shortness of breath with exertion or at rest.                productive cough,   non-productive cough, coughing up of blood.              change in color of mucus.  wheezing.   Skin:    rash or lesions. GI:  No-   heartburn, indigestion, abdominal pain, nausea, vomiting, diarrhea,                 change in bowel habits, loss of appetite GU: dysuria, change in color of urine, no urgency or frequency.   flank pain. MS:   joint pain, stiffness, decreased range of motion, back pain. Neuro-     nothing unusual Psych:  change in mood or affect.  depression or anxiety.   memory loss.  OBJ- Physical Exam General- Alert, Oriented, Affect-appropriate, Distress- none acute + overweight Skin- rash-none, lesions- none, excoriation- none Lymphadenopathy- none Head- atraumatic  Eyes- Gross vision intact, PERRLA, conjunctivae and secretions clear            Ears- Hearing, canals-normal            Nose- Clear, no-Septal dev, mucus, polyps, erosion, perforation             Throat- Mallampati IV , mucosa+dry , drainage- none, tonsils- atrophic Neck- flexible , trachea midline, no stridor , thyroid  nl, carotid no bruit Chest - symmetrical excursion , unlabored           Heart/CV- RRR , no murmur , no gallop  , no rub, nl s1 s2                           - JVD- none , edema- none, stasis changes- none, varices- none           Lung- clear to P&A, wheeze- none, cough- none , dullness-none, rub- none           Chest wall-  +Mastectomy L Abd-   Br/ Gen/ Rectal- Not done, not indicated Extrem- cyanosis- none, clubbing, none, atrophy- none, strength- nl Neuro- grossly intact to observation

## 2024-06-27 ENCOUNTER — Ambulatory Visit: Admitting: Internal Medicine

## 2024-06-27 ENCOUNTER — Encounter: Payer: Self-pay | Admitting: Internal Medicine

## 2024-06-27 VITALS — BP 120/64 | HR 88 | Temp 98.1°F | Ht 61.0 in | Wt 162.8 lb

## 2024-06-27 DIAGNOSIS — G4733 Obstructive sleep apnea (adult) (pediatric): Secondary | ICD-10-CM

## 2024-06-27 DIAGNOSIS — Z9989 Dependence on other enabling machines and devices: Secondary | ICD-10-CM

## 2024-06-27 NOTE — Patient Instructions (Signed)
 You are doing great with CPAP. We can continue auto 5-12.  Please call if we can help.

## 2024-07-05 ENCOUNTER — Encounter: Payer: Self-pay | Admitting: Internal Medicine

## 2024-07-14 ENCOUNTER — Telehealth: Payer: Self-pay | Admitting: Internal Medicine

## 2024-07-14 NOTE — Telephone Encounter (Signed)
 Called and spoke with Shawna Marquez today and info updated to fax to Pulmonary.

## 2024-07-14 NOTE — Telephone Encounter (Signed)
 Copied from CRM #[id number]. Topic: General - Other >> Jul 13, 2024  4:52 PM Chiquita SQUIBB wrote: Reason for CRM: Therisa from [facility] is calling to verify if the order for the Cpap supplies was received by the office. She stated that they have sent it twice. Please advise them at [phone] of any updates.

## 2024-08-01 ENCOUNTER — Ambulatory Visit: Payer: Self-pay | Admitting: Internal Medicine

## 2024-08-01 ENCOUNTER — Other Ambulatory Visit (INDEPENDENT_AMBULATORY_CARE_PROVIDER_SITE_OTHER)

## 2024-08-01 DIAGNOSIS — E039 Hypothyroidism, unspecified: Secondary | ICD-10-CM | POA: Diagnosis not present

## 2024-08-01 DIAGNOSIS — E7800 Pure hypercholesterolemia, unspecified: Secondary | ICD-10-CM

## 2024-08-01 LAB — HEPATIC FUNCTION PANEL
ALT: 19 U/L (ref 0–35)
AST: 22 U/L (ref 0–37)
Albumin: 4.1 g/dL (ref 3.5–5.2)
Alkaline Phosphatase: 64 U/L (ref 39–117)
Bilirubin, Direct: 0.1 mg/dL (ref 0.0–0.3)
Total Bilirubin: 0.5 mg/dL (ref 0.2–1.2)
Total Protein: 6.7 g/dL (ref 6.0–8.3)

## 2024-08-01 LAB — LIPID PANEL
Cholesterol: 122 mg/dL (ref 0–200)
HDL: 56.6 mg/dL (ref >39.00)
LDL Cholesterol: 51 mg/dL (ref 0–99)
NonHDL: 65.44
Total CHOL/HDL Ratio: 2
Triglycerides: 71 mg/dL (ref 0.0–149.0)
VLDL: 14.2 mg/dL (ref 0.0–40.0)

## 2024-08-01 LAB — TSH: TSH: 0.7 u[IU]/mL (ref 0.35–5.50)

## 2024-08-10 DIAGNOSIS — Z1231 Encounter for screening mammogram for malignant neoplasm of breast: Secondary | ICD-10-CM | POA: Diagnosis not present

## 2024-08-10 LAB — HM MAMMOGRAPHY

## 2024-10-19 ENCOUNTER — Emergency Department (HOSPITAL_COMMUNITY)

## 2024-10-19 ENCOUNTER — Other Ambulatory Visit: Payer: Self-pay

## 2024-10-19 ENCOUNTER — Emergency Department (HOSPITAL_COMMUNITY)
Admission: EM | Admit: 2024-10-19 | Discharge: 2024-10-20 | Disposition: A | Discharge status: Skilled Nursing Facility | Attending: Emergency Medicine | Admitting: Emergency Medicine

## 2024-10-19 DIAGNOSIS — E039 Hypothyroidism, unspecified: Secondary | ICD-10-CM | POA: Insufficient documentation

## 2024-10-19 DIAGNOSIS — E7800 Pure hypercholesterolemia, unspecified: Secondary | ICD-10-CM | POA: Diagnosis not present

## 2024-10-19 DIAGNOSIS — M160 Bilateral primary osteoarthritis of hip: Secondary | ICD-10-CM | POA: Diagnosis not present

## 2024-10-19 DIAGNOSIS — I6782 Cerebral ischemia: Secondary | ICD-10-CM | POA: Diagnosis not present

## 2024-10-19 DIAGNOSIS — I700 Atherosclerosis of aorta: Secondary | ICD-10-CM | POA: Diagnosis not present

## 2024-10-19 DIAGNOSIS — S22000A Wedge compression fracture of unspecified thoracic vertebra, initial encounter for closed fracture: Secondary | ICD-10-CM

## 2024-10-19 DIAGNOSIS — Z853 Personal history of malignant neoplasm of breast: Secondary | ICD-10-CM | POA: Diagnosis not present

## 2024-10-19 DIAGNOSIS — M549 Dorsalgia, unspecified: Secondary | ICD-10-CM | POA: Diagnosis not present

## 2024-10-19 DIAGNOSIS — W19XXXA Unspecified fall, initial encounter: Secondary | ICD-10-CM | POA: Insufficient documentation

## 2024-10-19 DIAGNOSIS — S22078A Other fracture of T9-T10 vertebra, initial encounter for closed fracture: Secondary | ICD-10-CM | POA: Diagnosis not present

## 2024-10-19 DIAGNOSIS — N644 Mastodynia: Secondary | ICD-10-CM | POA: Insufficient documentation

## 2024-10-19 DIAGNOSIS — R2681 Unsteadiness on feet: Secondary | ICD-10-CM | POA: Diagnosis not present

## 2024-10-19 DIAGNOSIS — M810 Age-related osteoporosis without current pathological fracture: Secondary | ICD-10-CM | POA: Diagnosis not present

## 2024-10-19 DIAGNOSIS — W1830XA Fall on same level, unspecified, initial encounter: Secondary | ICD-10-CM | POA: Diagnosis not present

## 2024-10-19 DIAGNOSIS — M47812 Spondylosis without myelopathy or radiculopathy, cervical region: Secondary | ICD-10-CM | POA: Diagnosis not present

## 2024-10-19 DIAGNOSIS — M4802 Spinal stenosis, cervical region: Secondary | ICD-10-CM | POA: Diagnosis not present

## 2024-10-19 DIAGNOSIS — R0689 Other abnormalities of breathing: Secondary | ICD-10-CM | POA: Diagnosis not present

## 2024-10-19 DIAGNOSIS — M6281 Muscle weakness (generalized): Secondary | ICD-10-CM | POA: Diagnosis not present

## 2024-10-19 DIAGNOSIS — Z4789 Encounter for other orthopedic aftercare: Secondary | ICD-10-CM | POA: Diagnosis not present

## 2024-10-19 DIAGNOSIS — S22079A Unspecified fracture of T9-T10 vertebra, initial encounter for closed fracture: Secondary | ICD-10-CM | POA: Diagnosis not present

## 2024-10-19 DIAGNOSIS — I10 Essential (primary) hypertension: Secondary | ICD-10-CM | POA: Diagnosis not present

## 2024-10-19 DIAGNOSIS — M546 Pain in thoracic spine: Secondary | ICD-10-CM | POA: Diagnosis not present

## 2024-10-19 DIAGNOSIS — G4733 Obstructive sleep apnea (adult) (pediatric): Secondary | ICD-10-CM | POA: Diagnosis not present

## 2024-10-19 DIAGNOSIS — M5030 Other cervical disc degeneration, unspecified cervical region: Secondary | ICD-10-CM | POA: Diagnosis not present

## 2024-10-19 DIAGNOSIS — S0990XA Unspecified injury of head, initial encounter: Secondary | ICD-10-CM | POA: Diagnosis present

## 2024-10-19 DIAGNOSIS — R911 Solitary pulmonary nodule: Secondary | ICD-10-CM | POA: Diagnosis not present

## 2024-10-19 LAB — I-STAT CHEM 8, ED
BUN: 20 mg/dL (ref 8–23)
Calcium, Ion: 1.16 mmol/L (ref 1.15–1.40)
Chloride: 101 mmol/L (ref 98–111)
Creatinine, Ser: 0.8 mg/dL (ref 0.44–1.00)
Glucose, Bld: 113 mg/dL — ABNORMAL HIGH (ref 70–99)
HCT: 42 % (ref 36.0–46.0)
Hemoglobin: 14.3 g/dL (ref 12.0–15.0)
Potassium: 3.8 mmol/L (ref 3.5–5.1)
Sodium: 137 mmol/L (ref 135–145)
TCO2: 26 mmol/L (ref 22–32)

## 2024-10-19 LAB — CBC WITH DIFFERENTIAL/PLATELET
Abs Immature Granulocytes: 0.08 K/uL — ABNORMAL HIGH (ref 0.00–0.07)
Basophils Absolute: 0.1 K/uL (ref 0.0–0.1)
Basophils Relative: 1 %
Eosinophils Absolute: 0.1 K/uL (ref 0.0–0.5)
Eosinophils Relative: 1 %
HCT: 42.6 % (ref 36.0–46.0)
Hemoglobin: 13.8 g/dL (ref 12.0–15.0)
Immature Granulocytes: 1 %
Lymphocytes Relative: 13 %
Lymphs Abs: 1.1 K/uL (ref 0.7–4.0)
MCH: 31.4 pg (ref 26.0–34.0)
MCHC: 32.4 g/dL (ref 30.0–36.0)
MCV: 96.8 fL (ref 80.0–100.0)
Monocytes Absolute: 0.5 K/uL (ref 0.1–1.0)
Monocytes Relative: 6 %
Neutro Abs: 6.8 K/uL (ref 1.7–7.7)
Neutrophils Relative %: 78 %
Platelets: 168 K/uL (ref 150–400)
RBC: 4.4 MIL/uL (ref 3.87–5.11)
RDW: 12.2 % (ref 11.5–15.5)
WBC: 8.6 K/uL (ref 4.0–10.5)
nRBC: 0 % (ref 0.0–0.2)

## 2024-10-19 MED ORDER — LEVOTHYROXINE SODIUM 25 MCG PO TABS
125.0000 ug | ORAL_TABLET | Freq: Every day | ORAL | Status: DC
  Administered 2024-10-20: 125 ug via ORAL
  Filled 2024-10-19: qty 1

## 2024-10-19 MED ORDER — LIDOCAINE 5 % EX PTCH
1.0000 | MEDICATED_PATCH | Freq: Once | CUTANEOUS | Status: AC
  Administered 2024-10-19: 1 via TRANSDERMAL
  Filled 2024-10-19: qty 1

## 2024-10-19 MED ORDER — ADULT MULTIVITAMIN W/MINERALS CH: 1.0000 | ORAL_TABLET | Freq: Every day | ORAL | Status: DC

## 2024-10-19 MED ORDER — ONDANSETRON HCL 4 MG/2ML IJ SOLN
4.0000 mg | Freq: Once | INTRAMUSCULAR | Status: AC
  Administered 2024-10-19: 4 mg via INTRAVENOUS
  Filled 2024-10-19: qty 2

## 2024-10-19 MED ORDER — ONDANSETRON HCL 4 MG/2ML IJ SOLN
4.0000 mg | Freq: Once | INTRAMUSCULAR | Status: DC
  Filled 2024-10-19: qty 2

## 2024-10-19 MED ORDER — COQ10 200 MG PO CAPS: 200.0000 mg | ORAL_CAPSULE | Freq: Every day | ORAL | Status: DC

## 2024-10-19 MED ORDER — OXYCODONE-ACETAMINOPHEN 5-325 MG PO TABS: 1.0000 | ORAL_TABLET | ORAL | Status: DC | PRN

## 2024-10-19 MED ORDER — TIZANIDINE HCL 4 MG PO TABS
4.0000 mg | ORAL_TABLET | Freq: Once | ORAL | Status: DC
  Filled 2024-10-19: qty 1

## 2024-10-19 MED ORDER — MORPHINE SULFATE (PF) 2 MG/ML IV SOLN
2.0000 mg | Freq: Once | INTRAVENOUS | Status: AC
  Administered 2024-10-19: 2 mg via INTRAVENOUS
  Filled 2024-10-19: qty 1

## 2024-10-19 MED ORDER — ACETAMINOPHEN 500 MG PO TABS
1000.0000 mg | ORAL_TABLET | Freq: Once | ORAL | Status: DC
  Filled 2024-10-19: qty 2

## 2024-10-19 MED ORDER — ONDANSETRON 4 MG PO TBDP
4.0000 mg | ORAL_TABLET | Freq: Three times a day (TID) | ORAL | Status: DC | PRN
Start: 2024-10-19 — End: 2024-10-20

## 2024-10-19 MED ORDER — MORPHINE SULFATE (PF) 4 MG/ML IV SOLN
4.0000 mg | Freq: Once | INTRAVENOUS | Status: DC
  Filled 2024-10-19: qty 1

## 2024-10-19 NOTE — TOC Initial Note (Signed)
 Transition of Care [facility]) - Initial/Assessment Note    Patient Details  Name: Shawna Marquez MRN: [id number] Date of Birth: [birth year]  [facility] CM/SW Contact:    Hartley KATHEE Robertson, LCSWA Phone Number: 10/19/2024, 4:44 PM  Clinical Narrative:                  CSW received consult for SNF, awaiting PT recs.         Patient Goals and CMS Choice            Expected Discharge Plan and Services                                              Prior Living Arrangements/Services                       Activities of Daily Living      Permission Sought/Granted                  Emotional Assessment              Admission diagnosis:  FALL/BACK DEFORMITY Patient Active Problem List   Diagnosis Date Noted   Low vitamin B12 level 02/02/2024   Breast pain, right 02/02/2024   Xerostomia 05/27/2023   Loss of transverse plantar arch 04/23/2023   Arm pain, anterior, left 01/31/2023   Left arm numbness 01/31/2023   Eczema 12/24/2021   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   Osteoporosis of forearm 07/31/2021   OSA on CPAP 01/27/2018   History of colonic polyps 01/23/2017   Lymphedema of arm 04/12/2015   Cancer of right breast [facility])    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 breast (HCC) 11/17/2014   Hypothyroidism 10/12/2007   Allergic rhinitis 10/12/2007   History of diverticulitis,  s/p partial colectomy 10/12/2007   Osteoarthritis 09/09/2007   PCP:  Geofm Glade JINNY, MD Pharmacy:   [id number] - [place], [place] - [address] [place] [address] Phone: [phone] Fax: [phone]  [place] [facility] [address] [place] [address] Phone: [phone] Fax: [phone]  [facility] [address] [place] [address] Phone: [phone] Fax: [phone]     Social Drivers of Health (SDOH) Social  History: SDOH Screenings   Food Insecurity: No Food Insecurity (05/10/2024)  Housing: Unknown (05/10/2024)  Transportation Needs: No Transportation Needs (05/10/2024)  Utilities: Not At Risk (05/10/2024)  Alcohol Screen: Low Risk  (05/10/2024)  Depression (PHQ2-9): Low Risk  (05/10/2024)  Financial Resource Strain: Low Risk  (05/10/2024)  Physical Activity: Insufficiently Active (05/10/2024)  Social Connections: Moderately Integrated (05/10/2024)  Stress: No Stress Concern Present (05/10/2024)  Tobacco Use: Low Risk  (06/27/2024)  Health Literacy: Adequate Health Literacy (05/10/2024)   SDOH Interventions:     Readmission Risk Interventions     No data to display

## 2024-10-19 NOTE — TOC Progression Note (Signed)
 Transition of Care [facility]) - Progression Note    Patient Details  Name: Shawna Marquez MRN: [id number] Date of Birth: [birth year]  [facility]) CM/SW Contact  Hartley KATHEE Robertson, [place] Phone Number: 10/19/2024, 5:55 PM  Clinical Narrative:     CSW spoke with [name] via phone, she states pt has a preference for [employer] as [name] works there. CSW explained [employer] ratings and insurance auth process, including additional copay for PB. CSW explained pt will have to use [facility] waiver. [name] currently in room 35, suffered from a fall today as well.                      Expected Discharge Plan and Services                                               Social Drivers of Health (SDOH) Interventions SDOH Screenings   Food Insecurity: No Food Insecurity (05/10/2024)  Housing: Unknown (05/10/2024)  Transportation Needs: No Transportation Needs (05/10/2024)  Utilities: Not At Risk (05/10/2024)  Alcohol Screen: Low Risk  (05/10/2024)  Depression (PHQ2-9): Low Risk  (05/10/2024)  Financial Resource Strain: Low Risk  (05/10/2024)  Physical Activity: Insufficiently Active (05/10/2024)  Social Connections: Moderately Integrated (05/10/2024)  Stress: No Stress Concern Present (05/10/2024)  Tobacco Use: Low Risk  (06/27/2024)  Health Literacy: Adequate Health Literacy (05/10/2024)    Readmission Risk Interventions     No data to display

## 2024-10-19 NOTE — Progress Notes (Signed)
 83 y/o F w/ hx osteoporosis who presents after a fall, found to have T9 coronally oriented fracture in the setting of anklyosing spondylitis  Recommendations: Recommend TLSO brace and outpatient follow-up

## 2024-10-19 NOTE — ED Notes (Signed)
 Pt to xray

## 2024-10-19 NOTE — Progress Notes (Signed)
 Orthopedic Tech Progress Note Patient Details:  Shawna Marquez 11-Feb-1941 [id number]  Ortho Devices Type of Ortho Device: Thoracolumbar corset (TLSO) Ortho Device/Splint Location: TLSO Brace Ortho Device/Splint Interventions: Ordered, Application, Adjustment   Post Interventions Patient Tolerated: Fair Instructions Provided: Adjustment of device, Care of device, Poper ambulation with device  Applied and Fitted Brace for Patient removed once pt laid back down. Thersia FALCON Kilani Joffe 10/19/2024, 5:43 PM

## 2024-10-19 NOTE — Consult Note (Signed)
 Neurosurgery Consult Note  Assessment:  83 y/o F w/ hx osteoporosis who presents after GLF, found to have T9 vertebral body fx in setting of anklyosing spondylitis  Plan:  TLSO brace for total 12 weeks. Will see her at the 6 week point     CC: back pain  HPI:     Patient is a 83 y.o. female w/ hx osteoprosis who presents after a GLF. Has thoracic back pain. Denies BLE numbness/weakness  Of note, her husband fell today and has a small IPH. 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   Osteoporosis of forearm 07/31/2021   OSA on CPAP 01/27/2018   History of colonic polyps 01/23/2017   Lymphedema of arm 04/12/2015   Cancer of right breast [facility])    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 breast (HCC) 11/17/2014   Hypothyroidism 10/12/2007   Allergic rhinitis 10/12/2007   History of diverticulitis,  s/p partial colectomy 10/12/2007   Osteoarthritis 09/09/2007   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   Complication of anesthesia    bp will drop,hard to wake up   DIVERTICULITIS, HX OF 10/12/2007   Family history of breast cancer    HYPOTHYROIDISM, PRIMARY 10/12/2007   OSTEOARTHRITIS 09/09/2007   back    Radiation 04/10/15-05/25/15   Right Breast   Sleep apnea    USES cpap NIGHTLY   Wears glasses     Past Surgical History:  Procedure Laterality Date   ABDOMINAL HYSTERECTOMY  1977   COLON SURGERY  2006   part  colectomy   COLONOSCOPY  2007   colovaginal fistula  2007   colon resection   EYE SURGERY     both cataracts   HERNIA REPAIR  2007   ventral   MASTECTOMY Left 1997   lt mast-axillary node dissection   PORTACATH PLACEMENT Left 01/01/2015   Procedure: INSERTION PORT-A-CATH;  Surgeon: Donnice Bury, MD;  Location: [facility] OR;  Service: General;  Laterality: Left;   RADIOACTIVE SEED GUIDED PARTIAL MASTECTOMY WITH AXILLARY SENTINEL LYMPH NODE BIOPSY Right 11/28/2014   Procedure: RADIOACTIVE SEED GUIDED RIGHT LUMPECTOMY WITH RIGHT AXILLARY SENTINEL LYMPH NODE BIOPSY;  Surgeon: Donnice Bury, MD;  Location: [facility];  Service: General;  Laterality: Right;   RE-EXCISION OF BREAST CANCER,SUPERIOR MARGINS Right 01/01/2015   Procedure: RIGHT BREAST MARGIN EXCISION;  Surgeon: Donnice Bury, MD;  Location: [facility] OR;  Service: General;  Laterality: Right;   TONSILLECTOMY     WISDOM TOOTH EXTRACTION      (Not in a hospital admission)  No Known Allergies  Social History   Tobacco Use   Smoking status: Never   Smokeless tobacco: Never  Substance Use Topics   Alcohol use: Not Currently    Alcohol/week: 1.0 standard drink of alcohol    Types: 1 Cans of beer per week    Comment: occassional beer    Family History  Problem  Relation Age of Onset   Breast cancer Paternal Grandmother 79   Lung cancer Father 81       smoker   Prostate cancer Cousin        paternal cousin    Heart disease Neg Hx        family hx   Cancer Neg Hx        lung ca   COPD Neg Hx        family hx   Colon cancer Neg Hx      Review of Systems Pertinent items are noted in HPI.  Objective:   Patient Vitals for the past 8 hrs:  BP Temp Temp src Pulse SpO2 Height Weight  10/19/24 1600 (!) 152/72 -- -- 96 -- -- --  10/19/24 1303 -- -- -- -- -- 5' 2 (1.575 m) 68.9 kg  10/19/24 1301 -- -- -- -- 98 % -- --  10/19/24 1301 -- 98 F (36.7 C) Oral -- -- -- --  10/19/24 1256 (!) 174/62 -- -- 89 98 %  -- --   No intake/output data recorded. No intake/output data recorded.    Exam: RUE: 5/5 deltoid, 5/5 bicep, 5/5 wrist extension, 5/5 tricep, 5/5 grip LUE: 5/5 deltoid, 5/5 bicep, 5/5 wrist extension, 5/5 tricep, 5/5 grip RLE: 5/5 IP, 5/5 quad, 5/5 ham, 5/5 DF, 5/5 EHL, 5/5 PF LLE: 5/5 IP, 5/5 quad, 5/5 ham, 5/5 DF, 5/5 EHL, 5/5 PF Sensation to light intact      Data ReviewCBC:  Lab Results  Component Value Date   WBC 8.6 10/19/2024   RBC 4.40 10/19/2024   BMP:  Lab Results  Component Value Date   GLUCOSE 113 (H) 10/19/2024   GLUCOSE 98 05/31/2015   CHLORIDE 106 05/31/2015   CO2 28 02/03/2024   CO2 28 05/31/2015   BUN 20 10/19/2024   BUN 15.4 05/31/2015   CREATININE 0.80 10/19/2024   CREATININE 0.6 05/31/2015   CALCIUM  9.1 02/03/2024   CALCIUM  8.8 05/31/2015

## 2024-10-19 NOTE — ED Notes (Signed)
 Ortho tech at bedside

## 2024-10-19 NOTE — ED Notes (Signed)
 PT unable to take oral medication due to the pain. MD notified. Verbal order for 4 mg iv Zofran  obtained and 2 mg morphine.

## 2024-10-19 NOTE — ED Provider Notes (Signed)
 [facility] Provider Note   CSN: [id number] Arrival date & time: 10/19/24  1249     History Chief Complaint  Patient presents with   Fall    HPI: Shawna Marquez is a 83 y.o. female with history pertinent prior breast cancer in remission status postmastectomy, OSA, hypothyroidism, hypercholesterolemia who presents complaining of fall. Patient arrived via EMS from home.  History provided by patient and EMS.  No interpreter required during this encounter.  Patient and EMS report that she and her husband were ambulating down the walkway, husband ambulates with a walker, and he had a fall, and was holding onto her and accidentally pulled her down as well.  Reports that she hit her flank on a low brick barrier, and was unable to get up due to pain.  Received 100 mcg of fentanyl  prior to arrival for pain control, however was otherwise GCS 15.  Patient denies hitting her head, loss of consciousness, neck pain, use of anticoagulants, extremity pain.  Prior to Admission medications   Medication Sig Start Date End Date Taking? Authorizing Provider  Calcium  Citrate-Vitamin D  (CALCIUM  CITRATE + D3 PO) Take 1 tablet by mouth daily at 12 noon. Calcium  500 mg, Vitamin D  800 units   Yes [provider]  cetirizine (ZYRTEC) 10 MG tablet Take 10 mg by mouth daily.   Yes [provider]  cholecalciferol (VITAMIN D ) 1000 UNITS tablet Take 1,000 Units by mouth daily with lunch.    Yes [provider]  Coenzyme Q10 (COQ10) 200 MG CAPS Take 200 mg by mouth daily at 2 PM.   Yes [provider]  fluticasone  (FLONASE ) 50 MCG/ACT nasal spray SPRAY 2 SPRAYS INTO EACH NOSTRIL EVERY DAY Patient taking differently: Place 1 spray into both nostrils 2 (two) times daily as needed for rhinitis. SPRAY 2 SPRAYS INTO EACH NOSTRIL EVERY DAY 02/04/24  Yes Burns, Glade JINNY, MD  levothyroxine  (SYNTHROID ) 125 MCG tablet TAKE 1 TABLET BY MOUTH DAILY BEFORE  BREAKFAST. 03/02/24  Yes Burns, Glade JINNY, MD  Multiple Vitamin (MULTIVITAMIN WITH MINERALS) TABS tablet Take 1 tablet by mouth daily with lunch.   Yes [provider]  rosuvastatin  (CRESTOR ) 5 MG tablet Take 1 tablet (5 mg total) by mouth daily. Patient taking differently: Take 5 mg by mouth at bedtime. 04/15/24  Yes Burns, Glade JINNY, MD  vitamin B-12 (CYANOCOBALAMIN ) 500 MCG tablet Take 500 mcg by mouth daily.   Yes [provider]     Allergies: Patient has no known allergies.   Review of Systems   ROS as per HPI  Physical Exam Updated Vital Signs BP (!) 153/73 (BP Location: Right Arm)   Pulse 80   Temp 97.9 F (36.6 C) (Oral)   Resp 19   Ht 5' 2 (1.575 m)   Wt 68.9 kg   SpO2 97%   BMI 27.80 kg/m  Physical Exam Vitals and nursing note reviewed.  Constitutional:      General: She is not in acute distress.    Appearance: She is well-developed.  HENT:     Head: Normocephalic and atraumatic.  Eyes:     Conjunctiva/sclera: Conjunctivae normal.  Neck:     Comments: Cervical collar in place Cardiovascular:     Rate and Rhythm: Normal rate and regular rhythm.     Heart sounds: No murmur heard. Pulmonary:     Effort: Pulmonary effort is normal. No respiratory distress.     Breath sounds: Normal breath  sounds.  Abdominal:     Palpations: Abdomen is soft.     Tenderness: There is no abdominal tenderness.  Musculoskeletal:        General: No swelling.     Cervical back: No bony tenderness.     Thoracic back: Tenderness (Left lower rib tenderness to palpation, no visible injury/hematoma) and bony tenderness (Tenderness without midline deformity) present.     Lumbar back: No bony tenderness.     Comments: Chest wall stable, nontender to AP and lateral compression.  Pelvis stable, nontender to AP and lateral compression, all 4 extremities appear atraumatic.  Skin:    General: Skin is warm and dry.     Capillary Refill: Capillary refill takes less than 2 seconds.   Neurological:     Mental Status: She is alert.  Psychiatric:        Mood and Affect: Mood normal.     ED Course/ Medical Decision Making/ A&P    Procedures Procedures   Medications Ordered in ED Medications  tiZANidine (ZANAFLEX) tablet 4 mg (4 mg Oral Not Given 10/19/24 1335)  acetaminophen  (TYLENOL ) tablet 1,000 mg (1,000 mg Oral Not Given 10/19/24 1335)  ondansetron  (ZOFRAN ) injection 4 mg (4 mg Intravenous Incomplete 10/19/24 1721)  morphine (PF) 4 MG/ML injection 4 mg (4 mg Intravenous Incomplete 10/19/24 1721)  oxyCODONE -acetaminophen  (PERCOCET/ROXICET) 5-325 MG per tablet 1 tablet (has no administration in time range)  ondansetron  (ZOFRAN -ODT) disintegrating tablet 4 mg (has no administration in time range)  levothyroxine  (SYNTHROID ) tablet 125 mcg (has no administration in time range)  multivitamin with minerals tablet 1 tablet (has no administration in time range)  morphine (PF) 2 MG/ML injection 2 mg (2 mg Intravenous Given 10/19/24 1338)  lidocaine  (LIDODERM ) 5 % 1 patch (1 patch Transdermal Patch Applied 10/19/24 1437)  ondansetron  (ZOFRAN ) injection 4 mg (4 mg Intravenous Given 10/19/24 1528)  morphine (PF) 2 MG/ML injection 2 mg (2 mg Intravenous Given 10/19/24 1528)    Medical Decision Making:   Shawna Marquez is a 83 y.o. female who presents for ground-level fall as per above.  Physical exam is pertinent for T-spine tenderness to palpation and posterior left rib tenderness to palpation.   The differential includes but is not limited to ICH, TBI, skull fracture, spinal fracture/dislocation, blunt thoracic trauma, hemothorax, pneumothorax, rib fractures, blunt abdominal trauma, hemorrhage, extremity fracture, dislocation.  Independent historian: EMS  External data reviewed: No pertinent external data  Labs: Ordered and Independent interpretation CBC: No leukocytosis, anemia, thrombocytopenia Chem-8: No AKI or emergent electrolyte derangement  Radiology:  Ordered and Independent interpretation Pelvis XR: Pelvic ring intact. No acute fx. Both hips located.  CT head: No ICH or displaced skull fracture CT C-spine: No displaced fracture or dislocation CT T-spine: I do appreciate multiple bridging osteophytes, as well as discontinuity of the T9 vertebral body CT C: No pneumothorax, hemothorax, overt bony displacement DG Thoracic Spine 2 View Result Date: 10/19/2024 EXAM: 2 VIEW(S) XRAY OF THE THORACIC SPINE 10/19/2024 06:27:00 PM COMPARISON: None available. CLINICAL HISTORY: Re-eval T9 fx alignment upright in brace Re-eval T9 fx alignment upright in brace FINDINGS: BONES: No acute fracture. No aggressive appearing osseous lesion. Alignment is normal. DISCS AND DEGENERATIVE CHANGES: No severe degenerative changes. SOFT TISSUES: The visualized lungs are clear. IMPRESSION: 1. No acute abnormality of the thoracic spine. Electronically signed by: Maude Stammer MD 10/19/2024 06:33 PM EST RP Workstation: [id number]   CT Head Wo Contrast Result Date: 10/19/2024 CLINICAL DATA:  Head trauma fall EXAM: CT HEAD  WITHOUT CONTRAST TECHNIQUE: Contiguous axial images were obtained from the base of the skull through the vertex without intravenous contrast. RADIATION DOSE REDUCTION: This exam was performed according to the departmental dose-optimization program which includes automated exposure control, adjustment of the mA and/or kV according to patient size and/or use of iterative reconstruction technique. COMPARISON:  None Available. FINDINGS: Brain: Limited exam secondary to abundant streak artifact, obscuring posterior fossa and occipital region of the brain. No gross mass or hemorrhage. Atrophy and chronic small vessel ischemic changes of the white matter. Chronic appearing small cerebellar infarcts. Ventricle size within normal limits Vascular: Carotid vascular calcification Skull: No obvious fracture Sinuses/Orbits: Moderate mucosal disease in the sinuses Other: None  IMPRESSION: Limited exam secondary to abundant streak artifact with limited assessment of the posterior fossa and occipital/posterior temporal regions. No gross acute intracranial abnormality. Atrophy and chronic small vessel ischemic changes of the white matter. Small chronic appearing cerebellar infarcts. Electronically Signed   By: Luke Bun M.D.   On: 10/19/2024 15:50   CT T-SPINE NO CHARGE Result Date: 10/19/2024 CLINICAL DATA:  Fall and back pain. EXAM: CT THORACIC SPINE WITHOUT CONTRAST TECHNIQUE: Multidetector CT images of the thoracic were obtained using the standard protocol without intravenous contrast. RADIATION DOSE REDUCTION: This exam was performed according to the departmental dose-optimization program which includes automated exposure control, adjustment of the mA and/or kV according to patient size and/or use of iterative reconstruction technique. COMPARISON:  Chest CT dated 10/19/2024. FINDINGS: Alignment: No acute subluxation. Vertebrae: There is advanced osteopenia. Longitudinal fracture through the body of T9 vertebra extending into the superior and inferior endplates with mild anterior displacement of the major fracture fragment and widening of the anterior T9-T10 disc space. No retropulsion. There is degenerative changes of the spine. Paraspinal and other soft tissues: Hematoma anterior to the T9 and T10 vertebra. Disc levels: Multilevel degenerative changes and anterior bridging osteophyte. IMPRESSION: T9 fracture as above.  No retropulsion. Electronically Signed   By: Vanetta Chou M.D.   On: 10/19/2024 15:13   CT Cervical Spine Wo Contrast Result Date: 10/19/2024 CLINICAL DATA:  Fall with neck trauma. EXAM: CT CERVICAL SPINE WITHOUT CONTRAST TECHNIQUE: Multidetector CT imaging of the cervical spine was performed without intravenous contrast. Multiplanar CT image reconstructions were also generated. RADIATION DOSE REDUCTION: This exam was performed according to the  departmental dose-optimization program which includes automated exposure control, adjustment of the mA and/or kV according to patient size and/or use of iterative reconstruction technique. COMPARISON:  None Available. FINDINGS: Alignment: No posttraumatic subluxation. Skull base and vertebrae: Vertebral body heights are maintained. There is moderate spondylosis throughout the cervical spine to include uncovertebral joint spurring and facet arthropathy. Moderate left-sided neural foraminal narrowing at the C3-4 level. Moderate right-sided neural foraminal narrowing at the C4-5 level. Bilateral neural foraminal narrowing at the C5-6 level right worse than left as well as bilateral neural foraminal narrowing at the C6-7 level. No acute fracture. Soft tissues and spinal canal: Prevertebral soft tissues are normal. Canal stenosis at the C6-7 level due to moderate posterior spurring and to lesser extent at the C5-6 level. Disc levels: Mild disc space narrowing from the C4-5 level to the C6-7 level. Upper chest: No acute findings. Other: None. IMPRESSION: 1. No acute cervical spine injury. 2. Moderate spondylosis throughout the cervical spine with multilevel disc disease and neural foraminal narrowing as described. Canal stenosis at the C6-7 level due to moderate posterior spurring and to lesser extent at the C5-6 level. Electronically Signed  By: Toribio Agreste M.D.   On: 10/19/2024 15:13   CT Chest Wo Contrast Result Date: 10/19/2024 CLINICAL DATA:  Fall and back pain. EXAM: CT CHEST WITHOUT CONTRAST TECHNIQUE: Multidetector CT imaging of the chest was performed following the standard protocol without IV contrast. RADIATION DOSE REDUCTION: This exam was performed according to the departmental dose-optimization program which includes automated exposure control, adjustment of the mA and/or kV according to patient size and/or use of iterative reconstruction technique. COMPARISON:  Chest CT dated 01/31/2004. Cardiac CT  dated 04/11/2024. FINDINGS: Evaluation of this exam is limited in the absence of intravenous contrast. Cardiovascular: Top-normal cardiac size. No pericardial effusion. There is calcification of the mitral annulus. Mild atherosclerotic calcification of the thoracic aorta. No aneurysmal dilatation. The central pulmonary arteries are grossly unremarkable. Mediastinum/Nodes: No hilar or mediastinal adenopathy. The esophagus is grossly unremarkable. No mediastinal fluid collection. Lungs/Pleura: Bibasilar subpleural atelectasis. No focal consolidation, pleural effusion, pneumothorax. There is a 6 mm right upper lobe subpleural nodule (67/5). The central airways are patent. Upper Abdomen: No acute abnormality. Musculoskeletal: There is osteopenia with degenerative changes of the spine. There is mildly displaced fracture of the T9 vertebra extending from the superior to the inferior endplate. There is slight anterior displacement of the major fracture fragment. There is widening of the anterior T9 and T10 disc space. No retropulsion. There is perivertebral hematoma anterior to T9 and T10 vertebra. IMPRESSION: 1. Mildly displaced fracture of the T9 vertebra with widening of the anterior T9 and T10 disc space and perivertebral hematoma. No retropulsion. 2. A 6 mm right upper lobe subpleural nodule. Non-contrast chest CT at 6-12 months is recommended. If the nodule is stable at time of repeat CT, then future CT at 18-24 months (from today's scan) is considered optional for low-risk patients, but is recommended for high-risk patients. This recommendation follows the consensus statement: Guidelines for Management of Incidental Pulmonary Nodules Detected on CT Images: From the Fleischner Society 2017; Radiology 2017; 284:228-243. 3.  Aortic Atherosclerosis (ICD10-I70.0). Electronically Signed   By: Vanetta Chou M.D.   On: 10/19/2024 15:09   DG Pelvis 1-2 Views Result Date: 10/19/2024 CLINICAL DATA:  Fall. EXAM: DG  PELVIS 1-2V COMPARISON:  None Available. FINDINGS: Mild symmetric osteoarthritic change of the hips. No acute fracture or dislocation involving the hips. Degenerative changes of the spine and symphysis pubis joint as well as the sacroiliac joints. Surgical clips over the abdomen likely due to previous hernia repair. IMPRESSION: 1. No acute findings. 2. Mild symmetric osteoarthritic change of the hips. Electronically Signed   By: Toribio Agreste M.D.   On: 10/19/2024 14:01    EKG/Medicine tests: Not indicated                Interventions: Morphine, Zofran   See the EMR for full details regarding lab and imaging results.  Currently, patient is awake, alert, and protecting own airway and is hemodynamically stable.  Patient overall well-appearing, vitally stable, however does complain of significant pain.  Patient does not have bony deformity, though does have T-spine tenderness as well as left posterior rib pain.  Will provide multimodal pain therapy.  Given patient does have focal bony tenderness, do feel that patient warrants CT imaging of the chest and thoracic spine.  Additionally patient is at risk due to her age for ICH and cervical spine fracture, therefore will obtain CT imaging of these areas as well as well as screening labs.  Screening labs reassuring, imaging obtained, and patient does not have  any acute abnormality of the chest wall, however does have T9 vertebral body fracture.  Patient is neurologically intact.  I consulted neurosurgery and spoke with Dr. Darnella, and discussed plan of TLSO brace, and uprights in brace with outpatient follow-up in clinic if uprights demonstrate similar alignment, Dr. Darnella felt that this was an appropriate plan.  Patient updated on plan of care.  TLSO brace and uprights ordered, at the time of handoff, pending uprights.  Is patient able to ambulate and pain recently controlled, consider outpatient management, if patient unable to ambulate, consider TOC consult.   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  Presentation is most consistent with acute complicated illness  Discussion of management or test interpretations with external provider(s): Dr. Darnella, neurosurgery  Risk Drugs:Prescription drug management and Parenteral controlled substances Treatment: Pending at the time of signout  Disposition: HANDOFF: At the time of signout, the patients uprights and TLSO had not yet been completed. I transferred care of the patient at the time of signout to Dr. Patt. I informed the incoming care provider of the patient's history, status, and management plan. I addressed all of their concerns and/or questions to the best of my ability. Please refer to the incoming care provider's note for details regarding the remainder of the patient's ED course and disposition.  MDM generated using voice dictation software and may contain dictation errors.  Please contact me for any clarification or with any questions.  Clinical Impression:  1. Thoracic compression fracture, closed, initial encounter (HCC)   2. Breast pain, right      Data Unavailable   Final Clinical Impression(s) / ED Diagnoses Final diagnoses:  Thoracic compression fracture, closed, initial encounter [facility])    Rx / DC Orders ED Discharge Orders          Ordered    AMB referral to orthopedics        10/20/24 0738             Rogelia Jerilynn RAMAN, MD 10/20/24 [phone]

## 2024-10-19 NOTE — ED Triage Notes (Signed)
 Pt to etc via [facility] em[facility] from home with co falling. PT walking with husband and husband began to fall and pulled wife down with him.  Pt hit her back on a brick wall.EMS notes a deformity to the thoracic area. PT did not hit head and does not take blood thinner.  No loc. PT arrived in ccollar. Midline thoracis spine tenderness. PT alert and oriented at this time. PT has a history of a mastectomy on the L. PT arrived with IV in right hand. PT received 100 mcg of fentanyl .

## 2024-10-19 NOTE — Discharge Instructions (Addendum)
 I advise you to wear your TLSO brace whenever sitting upright, standing, or walking. You do not need to wear the brace when lying down to sleep at night or when showering. Please call our office if you experience intractable back pain, leg weakness, or leg numbness. Thank you

## 2024-10-19 NOTE — ED Provider Notes (Signed)
  Physical Exam  BP (!) 152/72   Pulse 96   Temp 97.7 F (36.5 C) (Oral)   Ht 5' 2 (1.575 m)   Wt 68.9 kg   SpO2 98%   BMI 27.80 kg/m   Physical Exam  Procedures  Procedures  ED Course / MDM    Medical Decision Making Care assumed at 4 PM.  Patient is here after a fall.  Patient does have thoracic fractures.  Neurosurgery recommended brace and thoracic x-rays which are pending at signout.  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  Thoracic x-ray unremarkable.  I discussed with patient and her daughter.  Her daughter was already talking to social work and request that patient stay in the ER for PT consult in the morning.  They are looking at [facility] burn in particular.  Order pain medicine and PT consult in the morning.  Patient will be boarded in the ER  Amount and/or Complexity of Data Reviewed Labs: ordered. Radiology: ordered.  Risk OTC drugs. Prescription drug management.          Patt Alm Macho, MD 10/19/24 2009

## 2024-10-20 ENCOUNTER — Other Ambulatory Visit (HOSPITAL_COMMUNITY): Payer: Self-pay

## 2024-10-20 DIAGNOSIS — Z9012 Acquired absence of left breast and nipple: Secondary | ICD-10-CM | POA: Diagnosis not present

## 2024-10-20 DIAGNOSIS — Z853 Personal history of malignant neoplasm of breast: Secondary | ICD-10-CM | POA: Diagnosis not present

## 2024-10-20 DIAGNOSIS — S22078A Other fracture of T9-T10 vertebra, initial encounter for closed fracture: Secondary | ICD-10-CM | POA: Diagnosis not present

## 2024-10-20 DIAGNOSIS — Z9989 Dependence on other enabling machines and devices: Secondary | ICD-10-CM | POA: Diagnosis not present

## 2024-10-20 DIAGNOSIS — G4733 Obstructive sleep apnea (adult) (pediatric): Secondary | ICD-10-CM | POA: Diagnosis not present

## 2024-10-20 DIAGNOSIS — J302 Other seasonal allergic rhinitis: Secondary | ICD-10-CM | POA: Diagnosis not present

## 2024-10-20 DIAGNOSIS — E039 Hypothyroidism, unspecified: Secondary | ICD-10-CM | POA: Diagnosis not present

## 2024-10-20 DIAGNOSIS — Z4789 Encounter for other orthopedic aftercare: Secondary | ICD-10-CM | POA: Diagnosis not present

## 2024-10-20 DIAGNOSIS — Z7401 Bed confinement status: Secondary | ICD-10-CM | POA: Diagnosis not present

## 2024-10-20 DIAGNOSIS — E559 Vitamin D deficiency, unspecified: Secondary | ICD-10-CM | POA: Diagnosis not present

## 2024-10-20 DIAGNOSIS — N644 Mastodynia: Secondary | ICD-10-CM | POA: Diagnosis not present

## 2024-10-20 DIAGNOSIS — M47812 Spondylosis without myelopathy or radiculopathy, cervical region: Secondary | ICD-10-CM | POA: Diagnosis not present

## 2024-10-20 DIAGNOSIS — Z860100 Personal history of colon polyps, unspecified: Secondary | ICD-10-CM | POA: Diagnosis not present

## 2024-10-20 DIAGNOSIS — I890 Lymphedema, not elsewhere classified: Secondary | ICD-10-CM | POA: Diagnosis not present

## 2024-10-20 DIAGNOSIS — Z8719 Personal history of other diseases of the digestive system: Secondary | ICD-10-CM | POA: Diagnosis not present

## 2024-10-20 DIAGNOSIS — M1990 Unspecified osteoarthritis, unspecified site: Secondary | ICD-10-CM | POA: Diagnosis not present

## 2024-10-20 DIAGNOSIS — W19XXXD Unspecified fall, subsequent encounter: Secondary | ICD-10-CM | POA: Diagnosis not present

## 2024-10-20 DIAGNOSIS — E7800 Pure hypercholesterolemia, unspecified: Secondary | ICD-10-CM | POA: Diagnosis not present

## 2024-10-20 DIAGNOSIS — R531 Weakness: Secondary | ICD-10-CM | POA: Diagnosis not present

## 2024-10-20 DIAGNOSIS — G629 Polyneuropathy, unspecified: Secondary | ICD-10-CM | POA: Diagnosis not present

## 2024-10-20 DIAGNOSIS — E538 Deficiency of other specified B group vitamins: Secondary | ICD-10-CM | POA: Diagnosis not present

## 2024-10-20 DIAGNOSIS — M810 Age-related osteoporosis without current pathological fracture: Secondary | ICD-10-CM | POA: Diagnosis not present

## 2024-10-20 DIAGNOSIS — S22000D Wedge compression fracture of unspecified thoracic vertebra, subsequent encounter for fracture with routine healing: Secondary | ICD-10-CM | POA: Diagnosis not present

## 2024-10-20 MED ORDER — OXYCODONE-ACETAMINOPHEN 5-325 MG PO TABS
1.0000 | ORAL_TABLET | Freq: Four times a day (QID) | ORAL | 0 refills | Status: DC | PRN
  Filled 2024-10-20: qty 15, 4d supply, fill #0

## 2024-10-20 MED ORDER — OXYCODONE-ACETAMINOPHEN 5-325 MG PO TABS: 1.0000 | ORAL_TABLET | Freq: Four times a day (QID) | ORAL | 0 refills | Status: AC | PRN

## 2024-10-20 NOTE — ED Notes (Signed)
 ED Provider at bedside.

## 2024-10-20 NOTE — Evaluation (Signed)
 Physical Therapy Evaluation Patient Details Name: Shawna Marquez MRN: [id number] DOB: 05-01-1941 Today's Date: 10/20/2024  History of Present Illness  Pt is an 83 y.o female admitted 11/12 for fall in home, hitting back on a brick wall. Imaging showed T9 vertebral body fx, recommending TLSO and outpatient management.  PMH: breast cancer s/p mastectomy, OSA, hypothyroidism  Clinical Impression  Pt admitted with above diagnosis. Pt was able to ambulate with bil HHA with min assist and cues.  Pt was Independent before fall and is limited by postural instability and pain. Pt also learning how to use brace and learning precautions. Will need post acute rehab < 3 hours day prior to transition to A living.  Pt currently with functional limitations due to the deficits listed below (see PT Problem List). Pt will benefit from acute skilled PT to increase their independence and safety with mobility to allow discharge.           If plan is discharge home, recommend the following: A little help with walking and/or transfers;A little help with bathing/dressing/bathroom;Assistance with cooking/housework;Assist for transportation;Help with stairs or ramp for entrance   Can travel by private vehicle   No    Equipment Recommendations Rolling walker (2 wheels)  Recommendations for Other Services       Functional Status Assessment Patient has had a recent decline in their functional status and demonstrates the ability to make significant improvements in function in a reasonable and predictable amount of time.     Precautions / Restrictions Precautions Precautions: Fall;Back Precaution Booklet Issued: Yes (comment) Recall of Precautions/Restrictions: Intact Required Braces or Orthoses: Spinal Brace Spinal Brace: Thoracolumbosacral orthotic;Applied in sitting position Restrictions Weight Bearing Restrictions Per Provider Order: No      Mobility  Bed Mobility Overal bed mobility: Needs Assistance Bed  Mobility: Rolling, Sidelying to Sit, Sit to Sidelying Rolling: Min assist Sidelying to sit: Contact guard assist     Sit to sidelying: Contact guard assist General bed mobility comments: min assist for legs, increased effort.  Practiced log roll and side to sit.    Transfers Overall transfer level: Needs assistance Equipment used: 2 person hand held assist Transfers: Sit to/from Stand Sit to Stand: Contact guard assist           General transfer comment: Pt sitting on bed on arrival and reviewed application of brace as well as how to tighten. CGA for STS, required BUE support for mobility    Ambulation/Gait Ambulation/Gait assistance: Min assist Gait Distance (Feet): 300 Feet Assistive device: 2 person hand held assist Gait Pattern/deviations: Step-through pattern, Decreased stride length, Trunk flexed, Wide base of support, Drifts right/left, Staggering left, Staggering right, Antalgic   Gait velocity interpretation: <1.31 ft/sec, indicative of household ambulator   General Gait Details: Pt needs bil UE support and will likely need a RW for support until back heals. Pt unsteady at times needing cues and assist for safety.  Stairs            Wheelchair Mobility     Tilt Bed    Modified Rankin (Stroke Patients Only)       Balance Overall balance assessment: Needs assistance Sitting-balance support: No upper extremity supported, Feet supported Sitting balance-Leahy Scale: Fair     Standing balance support: Bilateral upper extremity supported, During functional activity, Reliant on assistive device for balance Standing balance-Leahy Scale: Poor Standing balance comment: Reliant on BUE support  Pertinent Vitals/Pain Pain Assessment Pain Assessment: Faces Faces Pain Scale: Hurts little more Pain Location: back Pain Descriptors / Indicators: Discomfort Pain Intervention(s): Limited activity within patient's tolerance,  Monitored during session, Premedicated before session, Repositioned    Home Living Family/patient expects to be discharged to:: Private residence Living Arrangements: Spouse/significant other Available Help at Discharge: Available PRN/intermittently;Family Type of Home: House Home Access: Stairs to enter;Ramped entrance (Ramp in back entrance) Entrance Stairs-Rails: Left;Right Entrance Stairs-Number of Steps: 7   Home Layout: One level Home Equipment: Rollator (4 wheels);Shower seat;Grab bars - toilet;Grab bars - tub/shower Additional Comments: Hope is for pt to d/c to [facility] with husband    Prior Function Prior Level of Function : Independent/Modified Independent             Mobility Comments: Ind, no AD ADLs Comments: Ind, 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     Extremity/Trunk Assessment   Upper Extremity Assessment Upper Extremity Assessment: Defer to OT evaluation    Lower Extremity Assessment Lower Extremity Assessment: Overall WFL for tasks assessed    Cervical / Trunk Assessment Cervical / Trunk Assessment: Kyphotic  Communication   Communication Communication: No apparent difficulties    Cognition Arousal: Alert Behavior During Therapy: WFL for tasks assessed/performed                             Following commands: Intact       Cueing Cueing Techniques: Verbal cues     General Comments General comments (skin integrity, edema, etc.): Daughters present    Exercises     Assessment/Plan    PT Assessment Patient needs continued PT services  PT Problem List Decreased activity tolerance;Decreased balance;Decreased mobility;Decreased knowledge of use of DME;Decreased safety awareness;Decreased knowledge of precautions       PT Treatment Interventions DME instruction;Gait training;Functional mobility training;Therapeutic activities;Therapeutic exercise;Balance training;Patient/family education    PT Goals (Current goals can be  found in the Care Plan section)  Acute Rehab PT Goals Patient Stated Goal: to go hoome PT Goal Formulation: With patient Time For Goal Achievement: 11/03/24 Potential to Achieve Goals: Good    Frequency Min 2X/week     Co-evaluation PT/OT/SLP Co-Evaluation/Treatment: Yes Reason for Co-Treatment: To address functional/ADL transfers;For patient/therapist safety PT goals addressed during session: Mobility/safety with mobility OT goals addressed during session: ADL's and self-care       AM-PAC PT 6 Clicks Mobility  Outcome Measure Help needed turning from your back to your side while in a flat bed without using bedrails?: A Little Help needed moving from lying on your back to sitting on the side of a flat bed without using bedrails?: A Little Help needed moving to and from a bed to a chair (including a wheelchair)?: A Little Help needed standing up from a chair using your arms (e.g., wheelchair or bedside chair)?: A Lot Help needed to walk in hospital room?: A Lot Help needed climbing 3-5 steps with a railing? : A Lot 6 Click Score: 15    End of Session Equipment Utilized During Treatment: Gait belt Activity Tolerance: Patient tolerated treatment well Patient left: with call bell/phone within reach;with family/visitor present (on stretcher) Nurse Communication: Mobility status PT Visit Diagnosis: Muscle weakness (generalized) (M62.81);Unsteadiness on feet (R26.81)    Time: 8954-8892 PT Time Calculation (min) (ACUTE ONLY): 22 min   Charges:   PT Evaluation $PT Eval Moderate Complexity: 1 Mod   PT General Charges $$ ACUTE PT VISIT: 1 Visit  [facility] M,PT Acute Rehab Services [phone]   Stephane JULIANNA Bevel 10/20/2024, 12:12 PM

## 2024-10-20 NOTE — NC FL2 (Signed)
 [place]  MEDICAID FL2 LEVEL OF CARE FORM     IDENTIFICATION  Patient Name: Shawna Marquez Birthdate: 1940/12/26 Sex: female Admission Date (Current Location): 10/19/2024  [facility] and [place] Number:  Producer, Television/film/video and Address:  [facility] [place], [address], [place], [place] [address]      Provider Number: [id number]  Attending Physician Name and Address:  Ula Prentice SAUNDERS, MD  Relative Name and Phone Number:  Savahanna Almendariz (dtr) [phone]    Current Level of Care: Hospital Recommended Level of Care: Skilled Nursing Facility Prior Approval Number:    Date Approved/Denied:   PASRR Number: [id number]  Discharge Plan: SNF    Current Diagnoses: Patient Active Problem List   Diagnosis Date Noted   Low vitamin B12 level 02/02/2024   Breast pain, right 02/02/2024   Xerostomia 05/27/2023   Loss of transverse plantar arch 04/23/2023   Arm pain, anterior, left 01/31/2023   Left arm numbness 01/31/2023   Eczema 12/24/2021   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   Osteoporosis of forearm 07/31/2021   OSA on CPAP 01/27/2018   History of colonic polyps 01/23/2017   Lymphedema of arm 04/12/2015   Cancer of right breast [facility])    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 breast (HCC) 11/17/2014   Hypothyroidism 10/12/2007   Allergic rhinitis 10/12/2007   History of diverticulitis,  s/p partial colectomy 10/12/2007   Osteoarthritis 09/09/2007    Orientation RESPIRATION BLADDER Height & Weight     Self, Time, Situation, Place  Normal Continent Weight: 152 lb (68.9 kg) Height:  5' 2 (157.5 cm)  BEHAVIORAL SYMPTOMS/MOOD NEUROLOGICAL BOWEL NUTRITION STATUS      Continent Diet (see dc summary)  AMBULATORY STATUS COMMUNICATION OF NEEDS Skin   Limited Assist Verbally Normal                       Personal Care Assistance Level of Assistance  Bathing, Feeding, Dressing Bathing Assistance: Limited assistance Feeding assistance: Limited  assistance Dressing Assistance: Limited assistance     Functional Limitations Info  Sight, Speech, Hearing Sight Info: Adequate Hearing Info: Adequate Speech Info: Adequate    SPECIAL CARE FACTORS FREQUENCY  PT (By licensed PT), OT (By licensed OT)     PT Frequency: 5x week OT Frequency: 5x week            Contractures Contractures Info: Not present    Additional Factors Info  Allergies   Allergies Info: NKA           Current Medications (10/20/2024):  This is the current hospital active medication list Current Facility-Administered Medications  Medication Dose Route Frequency Provider Last Rate Last Admin   acetaminophen  (TYLENOL ) tablet 1,000 mg  1,000 mg Oral Once Stanek, Lawrence S, MD       levothyroxine  (SYNTHROID ) tablet 125 mcg  125 mcg Oral QAC breakfast Patt Alm Macho, MD   125 mcg at 10/20/24 0818   morphine (PF) 4 MG/ML injection 4 mg  4 mg Intravenous Once Patt Alm Macho, MD       multivitamin with minerals tablet 1 tablet  1 tablet Oral Q lunch Patt Alm Macho, MD       ondansetron  (ZOFRAN ) injection 4 mg  4 mg Intravenous Once Yao, David Hsienta, MD       ondansetron  (ZOFRAN -ODT) disintegrating tablet 4 mg  4 mg Oral Q8H PRN Patt Alm Macho, MD       oxyCODONE -acetaminophen  (PERCOCET/ROXICET) [phone]  MG per tablet 1 tablet  1 tablet Oral Q4H PRN Yao, David Hsienta, MD       tiZANidine (ZANAFLEX) tablet 4 mg  4 mg Oral Once Stanek, Lawrence S, MD       Current Outpatient Medications  Medication Sig Dispense Refill   Calcium  Citrate-Vitamin D  (CALCIUM  CITRATE + D3 PO) Take 1 tablet by mouth daily at 12 noon. Calcium  500 mg, Vitamin D  800 units     cetirizine (ZYRTEC) 10 MG tablet Take 10 mg by mouth daily.     cholecalciferol (VITAMIN D ) 1000 UNITS tablet Take 1,000 Units by mouth daily with lunch.      Coenzyme Q10 (COQ10) 200 MG CAPS Take 200 mg by mouth daily at 2 PM.     fluticasone  (FLONASE ) 50 MCG/ACT nasal spray SPRAY 2 SPRAYS INTO  EACH NOSTRIL EVERY DAY (Patient taking differently: Place 1 spray into both nostrils 2 (two) times daily as needed for rhinitis. SPRAY 2 SPRAYS INTO EACH NOSTRIL EVERY DAY) 48 mL 3   levothyroxine  (SYNTHROID ) 125 MCG tablet TAKE 1 TABLET BY MOUTH DAILY BEFORE BREAKFAST. 90 tablet 3   Multiple Vitamin (MULTIVITAMIN WITH MINERALS) TABS tablet Take 1 tablet by mouth daily with lunch.     rosuvastatin  (CRESTOR ) 5 MG tablet Take 1 tablet (5 mg total) by mouth daily. (Patient taking differently: Take 5 mg by mouth at bedtime.) 90 tablet 3   vitamin B-12 (CYANOCOBALAMIN ) 500 MCG tablet Take 500 mcg by mouth daily.       Discharge Medications: Please see discharge summary for a list of discharge medications.  Relevant Imaging Results:  Relevant Lab Results:   Additional Information SSN [id number] [place], [place]

## 2024-10-20 NOTE — ED Provider Notes (Signed)
 Emergency Medicine Observation Re-evaluation Note  Shawna Marquez is a 83 y.o. female, seen on rounds today.  Pt initially presented to the ED for complaints of Fall Currently, the patient is resting comfortably in bed.  Physical Exam  BP (!) 153/73 (BP Location: Right Arm)   Pulse 80   Temp 97.9 F (36.6 C) (Oral)   Resp 19   Ht 5' 2 (1.575 m)   Wt 68.9 kg   SpO2 97%   BMI 27.80 kg/m  Physical Exam General: No acute distress Cardiac: Well-perfused Lungs: No respiratory distress   ED Course / MDM  EKG:   I have reviewed the labs performed to date as well as medications administered while in observation.  Recent changes in the last 24 hours include the patient was seen and found to have rib fractures and unable to ambulate.  Cares for her husband who is also here with difficulty ambulating after the fall  Plan  Current plan is for social work evaluation as well as PT/OT evaluation for SNF placement.    Ula Prentice SAUNDERS, MD 10/20/24 [phone]

## 2024-10-20 NOTE — ED Notes (Signed)
 Pts daughter at bedside

## 2024-10-20 NOTE — ED Notes (Signed)
 Pt ambulated to restroom with 1 person assist

## 2024-10-20 NOTE — Evaluation (Signed)
 Occupational Therapy Evaluation Patient Details Name: Shawna Marquez MRN: [id number] DOB: 10-28-1941 Today's Date: 10/20/2024   History of Present Illness   Pt is an 83 y.o female admitted 11/12 for fall in home, hitting back on a brick wall. Imaging showed T9 vertebral body fx, recommending TLSO and outpatient management.  PMH: breast cancer s/p mastectomy, OSA, hypothyroidism     Clinical Impressions Pt admitted based on above, and was seen based on problem list below. 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 Today pt is requiring set up  to CGA for ADLs. Bed mobility and functional transfers are  CGA to min assist with use of 2 person [facility] assist. Given pt's current status is below baseline recommending <3 hours of skilled rehab daily. OT will continue to follow acutely to maximize functional independence.        If plan is discharge home, recommend the following:   A little help with walking and/or transfers;A little help with bathing/dressing/bathroom;Assistance with cooking/housework     Functional Status Assessment   Patient has had a recent decline in their functional status and demonstrates the ability to make significant improvements in function in a reasonable and predictable amount of time.     Equipment Recommendations   Other (comment) (Defer to next venue)      Precautions/Restrictions   Precautions Precautions: Fall;Back Precaution Booklet Issued: Yes (comment) Recall of Precautions/Restrictions: Intact Required Braces or Orthoses: Spinal Brace Spinal Brace: Thoracolumbosacral orthotic;Applied in sitting position Restrictions Weight Bearing Restrictions Per Provider Order: No     Mobility Bed Mobility Overal bed mobility: Needs Assistance Bed Mobility: Rolling, Sidelying to Sit, Sit to Sidelying Rolling: Min assist Sidelying to sit: Contact guard assist     Sit to sidelying: Contact guard assist General  bed mobility comments: min assist for legs, increased effort    Transfers Overall transfer level: Needs assistance Equipment used: 2 person hand held assist Transfers: Sit to/from Stand Sit to Stand: Contact guard assist           General transfer comment: CGA for STS, required BUE support for mobility      Balance Overall balance assessment: Needs assistance Sitting-balance support: No upper extremity supported, Feet supported Sitting balance-Leahy Scale: Fair     Standing balance support: Bilateral upper extremity supported, During functional activity, Reliant on assistive device for balance Standing balance-Leahy Scale: Poor Standing balance comment: Reliant on BUE support     ADL either performed or assessed with clinical judgement   ADL Overall ADL's : Needs assistance/impaired Eating/Feeding: Set up;Sitting   Grooming: Contact guard assist;Standing   Upper Body Bathing: Set up;Sitting   Lower Body Bathing: Contact guard assist;Sit to/from stand   Upper Body Dressing : Set up;Sitting   Lower Body Dressing: Contact guard assist;Sit to/from stand Lower Body Dressing Details (indicate cue type and reason): Able to firgure 4 legs with increased effort Toilet Transfer: Contact guard Nurse, Adult Details (indicate cue type and reason): 2 person HH assist Toileting- Clothing Manipulation and Hygiene: Supervision/safety;Sitting/lateral lean       Functional mobility during ADLs: Contact guard assist;Minimal assistance General ADL Comments: Limited by decreased balance and strength     Vision Baseline Vision/History: 1 Wears glasses Patient Visual Report: No change from baseline Vision Assessment?: No apparent visual deficits            Pertinent Vitals/Pain Pain Assessment Pain Assessment: Faces Faces Pain Scale: Hurts little more Pain Location: back Pain  Descriptors / Indicators: Discomfort Pain Intervention(s):  Monitored during session     Extremity/Trunk Assessment Upper Extremity Assessment Upper Extremity Assessment: Generalized weakness   Lower Extremity Assessment Lower Extremity Assessment: Defer to PT evaluation   Cervical / Trunk Assessment Cervical / Trunk Assessment: Kyphotic   Communication Communication Communication: No apparent difficulties   Cognition Arousal: Alert Behavior During Therapy: WFL for tasks assessed/performed Cognition: No apparent impairments       Following commands: Intact       Cueing  General Comments   Cueing Techniques: Verbal cues  Daughters present for session and supportive           Home Living Family/patient expects to be discharged to:: Private residence Living Arrangements: Spouse/significant other Available Help at Discharge: Available PRN/intermittently;Family Type of Home: House Home Access: Stairs to enter;Ramped entrance (Ramp in back entrance) Entrance Stairs-Number of Steps: 7 Entrance Stairs-Rails: Left;Right Home Layout: One level     Bathroom Shower/Tub: Producer, Television/film/video: Handicapped height Bathroom Accessibility: Yes How Accessible: Accessible via walker Home Equipment: Rollator (4 wheels);Shower seat;Grab bars - toilet;Grab bars - tub/shower   Additional Comments: Hope is for pt to d/c to [facility] with husband      Prior Functioning/Environment Prior Level of Function : Independent/Modified Independent             Mobility Comments: Ind, no AD ADLs Comments: Ind, 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    OT Problem List: Decreased strength;Decreased range of motion;Decreased activity tolerance;Impaired balance (sitting and/or standing);Decreased knowledge of use of DME or AE;Cardiopulmonary status limiting activity;Decreased knowledge of precautions   OT Treatment/Interventions: Self-care/ADL training;Therapeutic exercise;Energy conservation;DME and/or AE instruction;Therapeutic  activities;Patient/family education;Balance training      OT Goals(Current goals can be found in the care plan section)   Acute Rehab OT Goals Patient Stated Goal: To get to rehab OT Goal Formulation: With patient Time For Goal Achievement: 11/03/24 Potential to Achieve Goals: Good   OT Frequency:  Min 2X/week    Co-evaluation PT/OT/SLP Co-Evaluation/Treatment: Yes Reason for Co-Treatment: To address functional/ADL transfers;For patient/therapist safety   OT goals addressed during session: ADL's and self-care      AM-PAC OT 6 Clicks Daily Activity     Outcome Measure Help from another person eating meals?: None Help from another person taking care of personal grooming?: A Little Help from another person toileting, which includes using toliet, bedpan, or urinal?: A Little Help from another person bathing (including washing, rinsing, drying)?: A Little Help from another person to put on and taking off regular upper body clothing?: A Little Help from another person to put on and taking off regular lower body clothing?: A Little 6 Click Score: 19   End of Session Equipment Utilized During Treatment: Gait belt;Back brace Nurse Communication: Mobility status  Activity Tolerance: Patient tolerated treatment well Patient left: in bed;with call bell/phone within reach  OT Visit Diagnosis: Unsteadiness on feet (R26.81);Other abnormalities of gait and mobility (R26.89);Muscle weakness (generalized) (M62.81)                Time: 8954-8892 OT Time Calculation (min): 22 min Charges:  OT General Charges $OT Visit: 1 Visit OT Evaluation $OT Eval Moderate Complexity: 1 Mod  Adrianne BROCKS, OT  Acute Rehabilitation Services Office [phone] Secure chat preferred   Adrianne GORMAN Savers 10/20/2024, 11:23 AM

## 2024-10-21 ENCOUNTER — Other Ambulatory Visit (HOSPITAL_COMMUNITY): Payer: Self-pay

## 2024-10-21 DIAGNOSIS — E039 Hypothyroidism, unspecified: Secondary | ICD-10-CM | POA: Diagnosis not present

## 2024-10-21 DIAGNOSIS — S22000D Wedge compression fracture of unspecified thoracic vertebra, subsequent encounter for fracture with routine healing: Secondary | ICD-10-CM | POA: Diagnosis not present

## 2024-10-21 DIAGNOSIS — E7800 Pure hypercholesterolemia, unspecified: Secondary | ICD-10-CM | POA: Diagnosis not present

## 2024-10-24 ENCOUNTER — Other Ambulatory Visit (HOSPITAL_COMMUNITY): Payer: Self-pay

## 2024-10-31 ENCOUNTER — Telehealth: Payer: Self-pay

## 2024-10-31 ENCOUNTER — Encounter: Payer: Self-pay | Admitting: Internal Medicine

## 2024-10-31 DIAGNOSIS — S22079A Unspecified fracture of T9-T10 vertebra, initial encounter for closed fracture: Secondary | ICD-10-CM | POA: Insufficient documentation

## 2024-10-31 NOTE — Patient Instructions (Incomplete)
      Blood work was ordered.       Medications changes include :   None    A referral was ordered and someone will call you to schedule an appointment.     No follow-ups on file.

## 2024-10-31 NOTE — Progress Notes (Unsigned)
 Subjective:    Patient ID: Shawna Marquez, female    DOB: [birth year], 83 y.o.   MRN: [id number]     HPI Camya is here for follow up from the hospital  Admitted 11/12-11/13-discharged to rehab and discharged to home 11/21  Presented to the ED after a fall.  Her and her husband were walking down a walkway and her husband had fallen.  He was holding onto her and accidentally pulled her down as well.  She reports that she hit her flank on a low brick barrier and was unable to get up into pain.  She received fentanyl  100 mcg prior to arrival.  There was no head trauma, LOC, neck pain or extremity pain.  She had tenderness in the lower left lower ribs and T-spine.  Blood work, x-rays ordered.  Pelvis XR, CT head, cervical spine, thoracic spine and lumbar spine x-rays done.  She was found to have T9 vertebral body fracture.  She was neurologically intact.  Neurosurgery consulted and advised TLSO brace  Hardly any pain during the day.  Only pain at night when she lays down.  Has been taking oxycodone  at night- helps her sleep.  The medication must help with the pain, but really makes her sleep.  She would prefer not to take this and wondered about a muscle relaxer.  Sees neurosurgery in December.  Has OT, PT coming.     Medications and allergies reviewed with patient and updated if appropriate.  Current Outpatient Medications on File Prior to Visit  Medication Sig Dispense Refill   Calcium  Citrate-Vitamin D  (CALCIUM  CITRATE + D3 PO) Take 1 tablet by mouth daily at 12 noon. Calcium  500 mg, Vitamin D  800 units     cetirizine (ZYRTEC) 10 MG tablet Take 10 mg by mouth daily.     cholecalciferol (VITAMIN D ) 1000 UNITS tablet Take 1,000 Units by mouth daily with lunch.      Coenzyme Q10 (COQ10) 200 MG CAPS Take 200 mg by mouth daily at 2 PM.     fluticasone  (FLONASE ) 50 MCG/ACT nasal spray SPRAY 2 SPRAYS INTO EACH NOSTRIL EVERY DAY (Patient taking differently: Place 1 spray into both nostrils 2  (two) times daily as needed for rhinitis. SPRAY 2 SPRAYS INTO EACH NOSTRIL EVERY DAY) 48 mL 3   levothyroxine  (SYNTHROID ) 125 MCG tablet TAKE 1 TABLET BY MOUTH DAILY BEFORE BREAKFAST. 90 tablet 3   Multiple Vitamin (MULTIVITAMIN WITH MINERALS) TABS tablet Take 1 tablet by mouth daily with lunch.     oxyCODONE -acetaminophen  (PERCOCET/ROXICET) 5-325 MG tablet Take 1 tablet by mouth every 6 (six) hours as needed for severe pain (pain score 7-10). 15 tablet 0   rosuvastatin  (CRESTOR ) 5 MG tablet Take 1 tablet (5 mg total) by mouth daily. (Patient taking differently: Take 5 mg by mouth at bedtime.) 90 tablet 3   vitamin B-12 (CYANOCOBALAMIN ) 500 MCG tablet Take 500 mcg by mouth daily.     No current facility-administered medications on file prior to visit.     Review of Systems  Constitutional:  Negative for fever.  Respiratory:  Negative for cough, shortness of breath and wheezing.   Cardiovascular:  Negative for chest pain, palpitations and leg swelling.  Gastrointestinal:  Negative for constipation.  Neurological:  Negative for light-headedness and headaches.       Objective:   Vitals:   11/01/24 1107  BP: (!) 140/76  Pulse: 78  Temp: 98.3 F (36.8 C)  SpO2: 98%   BP  Readings from Last 3 Encounters:  11/01/24 (!) 140/76  10/20/24 130/63  06/27/24 120/64   Wt Readings from Last 3 Encounters:  11/01/24 153 lb (69.4 kg)  10/19/24 152 lb (68.9 kg)  06/27/24 162 lb 12.8 oz (73.8 kg)   Body mass index is 27.98 kg/m.    Physical Exam Constitutional:      General: She is not in acute distress.    Appearance: Normal appearance.     Comments: Wearing TLSO brace  HENT:     Head: Normocephalic and atraumatic.  Eyes:     Conjunctiva/sclera: Conjunctivae normal.  Cardiovascular:     Rate and Rhythm: Normal rate and regular rhythm.     Heart sounds: Normal heart sounds.  Pulmonary:     Effort: Pulmonary effort is normal. No respiratory distress.     Breath sounds: Normal  breath sounds. No wheezing.  Musculoskeletal:     Cervical back: Neck supple.     Right lower leg: No edema.     Left lower leg: No edema.  Lymphadenopathy:     Cervical: No cervical adenopathy.  Skin:    General: Skin is warm and dry.     Findings: No rash.  Neurological:     Mental Status: She is alert. Mental status is at baseline.  Psychiatric:        Mood and Affect: Mood normal.        Behavior: Behavior normal.        Lab Results  Component Value Date   WBC 8.6 10/19/2024   HGB 14.3 10/19/2024   HCT 42.0 10/19/2024   PLT 168 10/19/2024   GLUCOSE 113 (H) 10/19/2024   CHOL 122 08/01/2024   TRIG 71.0 08/01/2024   HDL 56.60 08/01/2024   LDLCALC 51 08/01/2024   ALT 19 08/01/2024   AST 22 08/01/2024   NA 137 10/19/2024   K 3.8 10/19/2024   CL 101 10/19/2024   CREATININE 0.80 10/19/2024   BUN 20 10/19/2024   CO2 28 02/03/2024   TSH 0.70 08/01/2024   DG Thoracic Spine 2 View EXAM: 2 VIEW(S) XRAY OF THE THORACIC SPINE 10/19/2024 06:27:00 PM  COMPARISON: None available.  CLINICAL HISTORY: Re-eval T9 fx alignment upright in brace Re-eval T9 fx alignment upright in brace  FINDINGS:  BONES: No acute fracture. No aggressive appearing osseous lesion. Alignment is normal.  DISCS AND DEGENERATIVE CHANGES: No severe degenerative changes.  SOFT TISSUES: The visualized lungs are clear.  IMPRESSION: 1. No acute abnormality of the thoracic spine.  Electronically signed by: Maude Stammer MD 10/19/2024 06:33 PM EST RP Workstation: [id number] CT Head Wo Contrast CLINICAL DATA:  Head trauma fall  EXAM: CT HEAD WITHOUT CONTRAST  TECHNIQUE: Contiguous axial images were obtained from the base of the skull through the vertex without intravenous contrast.  RADIATION DOSE REDUCTION: This exam was performed according to the departmental dose-optimization program which includes automated exposure control, adjustment of the mA and/or kV according to patient size  and/or use of iterative reconstruction technique.  COMPARISON:  None Available.  FINDINGS: Brain: Limited exam secondary to abundant streak artifact, obscuring posterior fossa and occipital region of the brain. No gross mass or hemorrhage. Atrophy and chronic small vessel ischemic changes of the white matter. Chronic appearing small cerebellar infarcts. Ventricle size within normal limits  Vascular: Carotid vascular calcification  Skull: No obvious fracture  Sinuses/Orbits: Moderate mucosal disease in the sinuses  Other: None  IMPRESSION: Limited exam secondary to abundant streak artifact with limited assessment  of the posterior fossa and occipital/posterior temporal regions. No gross acute intracranial abnormality. Atrophy and chronic small vessel ischemic changes of the white matter. Small chronic appearing cerebellar infarcts.  Electronically Signed   By: Luke Bun M.D.   On: 10/19/2024 15:50 CT T-SPINE NO CHARGE CLINICAL DATA:  Fall and back pain.  EXAM: CT THORACIC SPINE WITHOUT CONTRAST  TECHNIQUE: Multidetector CT images of the thoracic were obtained using the standard protocol without intravenous contrast.  RADIATION DOSE REDUCTION: This exam was performed according to the departmental dose-optimization program which includes automated exposure control, adjustment of the mA and/or kV according to patient size and/or use of iterative reconstruction technique.  COMPARISON:  Chest CT dated 10/19/2024.  FINDINGS: Alignment: No acute subluxation.  Vertebrae: There is advanced osteopenia. Longitudinal fracture through the body of T9 vertebra extending into the superior and inferior endplates with mild anterior displacement of the major fracture fragment and widening of the anterior T9-T10 disc space. No retropulsion. There is degenerative changes of the spine.  Paraspinal and other soft tissues: Hematoma anterior to the T9 and T10 vertebra.  Disc  levels: Multilevel degenerative changes and anterior bridging osteophyte.  IMPRESSION: T9 fracture as above.  No retropulsion.  Electronically Signed   By: Vanetta Chou M.D.   On: 10/19/2024 15:13 CT Cervical Spine Wo Contrast CLINICAL DATA:  Fall with neck trauma.  EXAM: CT CERVICAL SPINE WITHOUT CONTRAST  TECHNIQUE: Multidetector CT imaging of the cervical spine was performed without intravenous contrast. Multiplanar CT image reconstructions were also generated.  RADIATION DOSE REDUCTION: This exam was performed according to the departmental dose-optimization program which includes automated exposure control, adjustment of the mA and/or kV according to patient size and/or use of iterative reconstruction technique.  COMPARISON:  None Available.  FINDINGS: Alignment: No posttraumatic subluxation.  Skull base and vertebrae: Vertebral body heights are maintained. There is moderate spondylosis throughout the cervical spine to include uncovertebral joint spurring and facet arthropathy. Moderate left-sided neural foraminal narrowing at the C3-4 level. Moderate right-sided neural foraminal narrowing at the C4-5 level. Bilateral neural foraminal narrowing at the C5-6 level right worse than left as well as bilateral neural foraminal narrowing at the C6-7 level. No acute fracture.  Soft tissues and spinal canal: Prevertebral soft tissues are normal. Canal stenosis at the C6-7 level due to moderate posterior spurring and to lesser extent at the C5-6 level.  Disc levels: Mild disc space narrowing from the C4-5 level to the C6-7 level.  Upper chest: No acute findings.  Other: None.  IMPRESSION: 1. No acute cervical spine injury. 2. Moderate spondylosis throughout the cervical spine with multilevel disc disease and neural foraminal narrowing as described. Canal stenosis at the C6-7 level due to moderate posterior spurring and to lesser extent at the C5-6  level.  Electronically Signed   By: Toribio Agreste M.D.   On: 10/19/2024 15:13 CT Chest Wo Contrast CLINICAL DATA:  Fall and back pain.  EXAM: CT CHEST WITHOUT CONTRAST  TECHNIQUE: Multidetector CT imaging of the chest was performed following the standard protocol without IV contrast.  RADIATION DOSE REDUCTION: This exam was performed according to the departmental dose-optimization program which includes automated exposure control, adjustment of the mA and/or kV according to patient size and/or use of iterative reconstruction technique.  COMPARISON:  Chest CT dated 01/31/2004. Cardiac CT dated 04/11/2024.  FINDINGS: Evaluation of this exam is limited in the absence of intravenous contrast.  Cardiovascular: Top-normal cardiac size. No pericardial effusion. There is calcification of the  mitral annulus. Mild atherosclerotic calcification of the thoracic aorta. No aneurysmal dilatation. The central pulmonary arteries are grossly unremarkable.  Mediastinum/Nodes: No hilar or mediastinal adenopathy. The esophagus is grossly unremarkable. No mediastinal fluid collection.  Lungs/Pleura: Bibasilar subpleural atelectasis. No focal consolidation, pleural effusion, pneumothorax. There is a 6 mm right upper lobe subpleural nodule (67/5). The central airways are patent.  Upper Abdomen: No acute abnormality.  Musculoskeletal: There is osteopenia with degenerative changes of the spine. There is mildly displaced fracture of the T9 vertebra extending from the superior to the inferior endplate. There is slight anterior displacement of the major fracture fragment. There is widening of the anterior T9 and T10 disc space. No retropulsion. There is perivertebral hematoma anterior to T9 and T10 vertebra.  IMPRESSION: 1. Mildly displaced fracture of the T9 vertebra with widening of the anterior T9 and T10 disc space and perivertebral hematoma. No retropulsion. 2. A 6 mm right upper lobe  subpleural nodule. Non-contrast chest CT at 6-12 months is recommended. If the nodule is stable at time of repeat CT, then future CT at 18-24 months (from today's scan) is considered optional for low-risk patients, but is recommended for high-risk patients. This recommendation follows the consensus statement: Guidelines for Management of Incidental Pulmonary Nodules Detected on CT Images: From the Fleischner Society 2017; Radiology 2017; 284:228-243. 3.  Aortic Atherosclerosis (ICD10-I70.0).  Electronically Signed   By: Vanetta Chou M.D.   On: 10/19/2024 15:09 DG Pelvis 1-2 Views CLINICAL DATA:  Fall.  EXAM: DG PELVIS 1-2V  COMPARISON:  None Available.  FINDINGS: Mild symmetric osteoarthritic change of the hips. No acute fracture or dislocation involving the hips. Degenerative changes of the spine and symphysis pubis joint as well as the sacroiliac joints. Surgical clips over the abdomen likely due to previous hernia repair.  IMPRESSION: 1. No acute findings. 2. Mild symmetric osteoarthritic change of the hips.  Electronically Signed   By: Toribio Agreste M.D.   On: 10/19/2024 14:01    Assessment & Plan:    See Problem List for Assessment and Plan of chronic medical problems.

## 2024-10-31 NOTE — Telephone Encounter (Signed)
 Copied from CRM #[id number]. Topic: Clinical - Home Health Verbal Orders >> Oct 31, 2024  4:30 PM Rea ORN wrote: Caller/Agency: Lorrene with [facility]  Callback Number: [phone] Service Requested: Physical Therapy Frequency: One week 2, times a week for 2 weeks  Any new concerns about the patient? No

## 2024-10-31 NOTE — Transitions of Care (Post Inpatient/ED Visit) (Signed)
 10/31/2024  Name: Shawna Marquez MRN: [id number] DOB: [birth year]  Today's TOC FU Call Status: Today's TOC FU Call Status:: Successful TOC FU Call Completed TOC FU Call Complete Date: 10/31/24  Patient's Name and Date of Birth confirmed. Name, DOB  Transition Care Management Follow-up Telephone Call Date of Discharge: 10/28/24 Discharge Facility: Other [facility]) Name of Other ([facility]) Discharge Facility: [facility] Type of Discharge: Inpatient Admission Primary Inpatient Discharge Diagnosis:: fx vertebra How have you been since you were released from the hospital?: Better Any questions or concerns?: No  Items Reviewed: Did you receive and understand the discharge instructions provided?: Yes Medications obtained,verified, and reconciled?: Yes (Medications Reviewed) Any new allergies since your discharge?: No Dietary orders reviewed?: Yes Do you have support at home?: Yes People in Home [RPT]: child(ren), adult  Medications Reviewed Today: Medications Reviewed Today     Reviewed by Emmitt Pan, LPN (Licensed Practical Nurse) on 10/31/24 at 1121  Med List Status: <None>   Medication Order Taking? Sig Documenting Provider Last Dose Status Informant  Calcium  Citrate-Vitamin D  (CALCIUM  CITRATE + D3 PO) [id number] Yes Take 1 tablet by mouth daily at 12 noon. Calcium  500 mg, Vitamin D  800 units [provider]  Active Self, Pharmacy Records  cetirizine (ZYRTEC) 10 MG tablet [id number] Yes Take 10 mg by mouth daily. [provider]  Active Self, Pharmacy Records  cholecalciferol (VITAMIN D ) 1000 UNITS tablet [id number] Yes Take 1,000 Units by mouth daily with lunch.  [provider]  Active Self, Pharmacy Records  Coenzyme Q10 (COQ10) 200 MG CAPS [id number] Yes Take 200 mg by mouth daily at 2 PM. [provider]  Active Self, Pharmacy Records  fluticasone  (FLONASE ) 50 MCG/ACT nasal spray [id number] Yes SPRAY 2 SPRAYS INTO EACH NOSTRIL EVERY  DAY  Patient taking differently: Place 1 spray into both nostrils 2 (two) times daily as needed for rhinitis. SPRAY 2 SPRAYS INTO EACH NOSTRIL EVERY DAY   Burns, Glade JINNY, MD  Active Self, Pharmacy Records  levothyroxine  (SYNTHROID ) 125 MCG tablet [id number] Yes TAKE 1 TABLET BY MOUTH DAILY BEFORE BREAKFAST. Geofm Glade JINNY, MD  Active Self, Pharmacy Records  Multiple Vitamin (MULTIVITAMIN WITH MINERALS) TABS tablet [id number] Yes Take 1 tablet by mouth daily with lunch. [provider]  Active Self, Pharmacy Records  oxyCODONE -acetaminophen  (PERCOCET/ROXICET) 5-325 MG tablet [id number] Yes Take 1 tablet by mouth every 6 (six) hours as needed for severe pain (pain score 7-10). Ula Prentice SAUNDERS, MD  Active   rosuvastatin  (CRESTOR ) 5 MG tablet [id number] Yes Take 1 tablet (5 mg total) by mouth daily.  Patient taking differently: Take 5 mg by mouth at bedtime.   Geofm Glade JINNY, MD  Active Self, Pharmacy Records  vitamin B-12 (CYANOCOBALAMIN ) 500 MCG tablet [id number] Yes Take 500 mcg by mouth daily. [provider]  Active Self, Pharmacy Records            Home Care and Equipment/Supplies: Were Home Health Services Ordered?: Yes Name of Home Health Agency:: [facility] Has Agency set up a time to come to your home?: Yes First Home Health Visit Date: 10/29/24 Any new equipment or medical supplies ordered?: NA  Functional Questionnaire: Do you need assistance with bathing/showering or dressing?: No Do you need assistance with meal preparation?: No Do you need assistance with eating?: No Do you have difficulty maintaining continence: No Do you need assistance with getting out of bed/getting out of a chair/moving?: No Do you have difficulty managing or taking your medications?: No  Follow  up appointments reviewed: PCP Follow-up appointment confirmed?: Yes Date of PCP follow-up appointment?: 11/01/24 Follow-up Provider: [facility] Follow-up appointment confirmed?:  Yes Date of Specialist follow-up appointment?: 11/21/24 Follow-Up Specialty Provider:: neuro Do you need transportation to your follow-up appointment?: No Do you understand care options if your condition(s) worsen?: Yes-patient verbalized understanding    SIGNATURE Julian Lemmings, LPN [facility] Nurse Health Advisor Direct Dial [phone]

## 2024-11-01 ENCOUNTER — Ambulatory Visit: Admitting: Internal Medicine

## 2024-11-01 VITALS — BP 140/76 | HR 78 | Temp 98.3°F | Ht 62.0 in | Wt 153.0 lb

## 2024-11-01 DIAGNOSIS — S22078A Other fracture of T9-T10 vertebra, initial encounter for closed fracture: Secondary | ICD-10-CM

## 2024-11-01 DIAGNOSIS — M810 Age-related osteoporosis without current pathological fracture: Secondary | ICD-10-CM

## 2024-11-01 DIAGNOSIS — E039 Hypothyroidism, unspecified: Secondary | ICD-10-CM

## 2024-11-01 MED ORDER — METHOCARBAMOL 500 MG PO TABS: 500.0000 mg | ORAL_TABLET | Freq: Two times a day (BID) | ORAL | 1 refills | Status: AC | PRN

## 2024-11-01 NOTE — Assessment & Plan Note (Addendum)
 Acute Related to a fall on 11/12 Currently wearing TLSO brace, has follow-up with neurosurgery next month Pain management with Tylenol  during the day, Percocet 5-325 mg at bedtime Would like to try muscle relaxer at night because she does feel muscle pain at night and hopefully not needed the oxycodone  Start methocarbamol  500 mg at bedtime-can try tizanidine  if this is not covered or not effective

## 2024-11-01 NOTE — Telephone Encounter (Signed)
 Okay for orders?

## 2024-11-01 NOTE — Assessment & Plan Note (Signed)
 Chronic  Clinically euthyroid Lab Results  Component Value Date   TSH 0.70 08/01/2024   Continue levothyroxine  125 mcg daily

## 2024-11-01 NOTE — Assessment & Plan Note (Addendum)
 Chronic Has osteoporosis and left radius Recent T9 fracture after fall Osteopenia bilateral hips, normal bone density spine Advised calcium , vitamin D  daily in addition to exercise and regular basis Will discuss options to repeat bone density at her next routine visit.  The bone density in your spine was normal on her last bone density so I think she would have broken a bone no matter what based on the trunk she had, but would like to be proactive with treating the mild osteoporosis that she has to prevent further fractures

## 2024-11-01 NOTE — Telephone Encounter (Signed)
 Message left today with verbals.

## 2024-11-09 DIAGNOSIS — Z853 Personal history of malignant neoplasm of breast: Secondary | ICD-10-CM | POA: Diagnosis not present

## 2024-11-09 DIAGNOSIS — G4733 Obstructive sleep apnea (adult) (pediatric): Secondary | ICD-10-CM | POA: Diagnosis not present

## 2024-11-09 DIAGNOSIS — Z9181 History of falling: Secondary | ICD-10-CM | POA: Diagnosis not present

## 2024-11-09 DIAGNOSIS — E039 Hypothyroidism, unspecified: Secondary | ICD-10-CM | POA: Diagnosis not present

## 2024-11-09 DIAGNOSIS — M1990 Unspecified osteoarthritis, unspecified site: Secondary | ICD-10-CM | POA: Diagnosis not present

## 2024-11-09 DIAGNOSIS — M8008XD Age-related osteoporosis with current pathological fracture, vertebra(e), subsequent encounter for fracture with routine healing: Secondary | ICD-10-CM | POA: Diagnosis not present

## 2024-11-09 DIAGNOSIS — G622 Polyneuropathy due to other toxic agents: Secondary | ICD-10-CM | POA: Diagnosis not present

## 2024-11-09 DIAGNOSIS — Z860100 Personal history of colon polyps, unspecified: Secondary | ICD-10-CM | POA: Diagnosis not present

## 2025-02-08 ENCOUNTER — Encounter: Admitting: Internal Medicine

## 2025-05-12 ENCOUNTER — Ambulatory Visit
